# Patient Record
Sex: Female | Born: 1935 | Race: White | Hispanic: No | Smoking: Former smoker
Health system: Southern US, Community
[De-identification: ages and names within clinical notes are randomized; demographics above are authoritative.]

## PROBLEM LIST (undated history)

## (undated) DIAGNOSIS — D649 Anemia, unspecified: Secondary | ICD-10-CM

## (undated) DIAGNOSIS — R222 Localized swelling, mass and lump, trunk: Secondary | ICD-10-CM

## (undated) DIAGNOSIS — J449 Chronic obstructive pulmonary disease, unspecified: Secondary | ICD-10-CM

## (undated) DIAGNOSIS — J189 Pneumonia, unspecified organism: Secondary | ICD-10-CM

## (undated) DIAGNOSIS — M810 Age-related osteoporosis without current pathological fracture: Secondary | ICD-10-CM

## (undated) DIAGNOSIS — IMO0001 Reserved for inherently not codable concepts without codable children: Secondary | ICD-10-CM

## (undated) DIAGNOSIS — R609 Edema, unspecified: Secondary | ICD-10-CM

## (undated) DIAGNOSIS — E538 Deficiency of other specified B group vitamins: Secondary | ICD-10-CM

## (undated) DIAGNOSIS — K222 Esophageal obstruction: Secondary | ICD-10-CM

## (undated) DIAGNOSIS — J984 Other disorders of lung: Secondary | ICD-10-CM

## (undated) DIAGNOSIS — S022XXA Fracture of nasal bones, initial encounter for closed fracture: Secondary | ICD-10-CM

## (undated) DIAGNOSIS — J9610 Chronic respiratory failure, unspecified whether with hypoxia or hypercapnia: Secondary | ICD-10-CM

## (undated) DIAGNOSIS — M1990 Unspecified osteoarthritis, unspecified site: Secondary | ICD-10-CM

## (undated) HISTORY — DX: Deficiency of other specified B group vitamins: E53.8

## (undated) HISTORY — DX: Esophageal obstruction: K22.2

## (undated) HISTORY — DX: Chronic obstructive pulmonary disease, unspecified: J44.9

## (undated) HISTORY — PX: OTHER SURGICAL HISTORY: SHX169

## (undated) HISTORY — PX: CHOLECYSTECTOMY: SHX55

## (undated) HISTORY — PX: LUMBAR LAMINECTOMY: SHX95

## (undated) HISTORY — DX: Edema, unspecified: R60.9

## (undated) HISTORY — DX: Unspecified osteoarthritis, unspecified site: M19.90

## (undated) HISTORY — DX: Localized swelling, mass and lump, trunk: R22.2

## (undated) HISTORY — DX: Fracture of nasal bones, initial encounter for closed fracture: S02.2XXA

## (undated) HISTORY — PX: APPENDECTOMY: SHX54

## (undated) HISTORY — DX: Other disorders of lung: J98.4

## (undated) HISTORY — DX: Age-related osteoporosis without current pathological fracture: M81.0

## (undated) HISTORY — PX: ESOPHAGOGASTRODUODENOSCOPY: SHX1529

## (undated) HISTORY — PX: NASAL RECONSTRUCTION WITH SEPTAL REPAIR: SHX5665

## (undated) HISTORY — DX: Chronic respiratory failure, unspecified whether with hypoxia or hypercapnia: J96.10

## (undated) HISTORY — PX: RECTAL PROLAPSE REPAIR: SHX759

## (undated) HISTORY — PX: CATARACT EXTRACTION: SUR2

## (undated) HISTORY — PX: BREAST SURGERY: SHX581

---

## 2005-09-07 ENCOUNTER — Ambulatory Visit: Payer: Self-pay | Admitting: Internal Medicine

## 2005-10-12 ENCOUNTER — Ambulatory Visit: Payer: Self-pay | Admitting: Internal Medicine

## 2005-12-28 ENCOUNTER — Ambulatory Visit: Payer: Self-pay | Admitting: Internal Medicine

## 2006-02-12 ENCOUNTER — Ambulatory Visit: Payer: Self-pay | Admitting: Internal Medicine

## 2006-09-10 ENCOUNTER — Ambulatory Visit: Payer: Self-pay | Admitting: Internal Medicine

## 2006-09-17 ENCOUNTER — Ambulatory Visit: Payer: Self-pay | Admitting: Internal Medicine

## 2006-09-27 ENCOUNTER — Ambulatory Visit (HOSPITAL_COMMUNITY): Admission: RE | Admit: 2006-09-27 | Discharge: 2006-09-27 | Payer: Self-pay | Admitting: Internal Medicine

## 2006-10-16 ENCOUNTER — Encounter: Admission: RE | Admit: 2006-10-16 | Discharge: 2006-10-16 | Payer: Self-pay | Admitting: Internal Medicine

## 2007-02-14 ENCOUNTER — Ambulatory Visit: Payer: Self-pay | Admitting: Internal Medicine

## 2007-04-15 ENCOUNTER — Ambulatory Visit: Payer: Self-pay | Admitting: Internal Medicine

## 2007-04-17 ENCOUNTER — Encounter: Admission: RE | Admit: 2007-04-17 | Discharge: 2007-04-17 | Payer: Self-pay | Admitting: Internal Medicine

## 2007-08-07 ENCOUNTER — Telehealth: Payer: Self-pay | Admitting: Internal Medicine

## 2007-10-03 ENCOUNTER — Encounter: Admission: RE | Admit: 2007-10-03 | Discharge: 2007-10-03 | Payer: Self-pay | Admitting: Internal Medicine

## 2007-11-05 ENCOUNTER — Encounter: Payer: Self-pay | Admitting: Internal Medicine

## 2007-12-27 ENCOUNTER — Ambulatory Visit: Payer: Self-pay | Admitting: Internal Medicine

## 2007-12-27 DIAGNOSIS — R609 Edema, unspecified: Secondary | ICD-10-CM

## 2007-12-27 DIAGNOSIS — M810 Age-related osteoporosis without current pathological fracture: Secondary | ICD-10-CM

## 2007-12-27 HISTORY — DX: Edema, unspecified: R60.9

## 2007-12-27 HISTORY — DX: Age-related osteoporosis without current pathological fracture: M81.0

## 2008-03-24 ENCOUNTER — Ambulatory Visit: Payer: Self-pay | Admitting: Internal Medicine

## 2008-03-24 LAB — CONVERTED CEMR LAB
AST: 17 units/L (ref 0–37)
Basophils Absolute: 0.1 10*3/uL (ref 0.0–0.1)
Basophils Relative: 1.7 % — ABNORMAL HIGH (ref 0.0–1.0)
Blood in Urine, dipstick: NEGATIVE
Chloride: 104 meq/L (ref 96–112)
Cholesterol: 225 mg/dL (ref 0–200)
Creatinine, Ser: 1 mg/dL (ref 0.4–1.2)
Direct LDL: 126 mg/dL
Eosinophils Absolute: 0.5 10*3/uL (ref 0.0–0.7)
GFR calc non Af Amer: 56 mL/min
Glucose, Urine, Semiquant: NEGATIVE
HDL: 89.4 mg/dL (ref >39.0)
Ketones, urine, test strip: NEGATIVE
MCHC: 32.9 g/dL (ref 30.0–36.0)
MCV: 97.1 fL (ref 78.0–100.0)
Neutrophils Relative %: 51.7 % (ref 43.0–77.0)
Platelets: 293 10*3/uL (ref 150–400)
RBC: 4.1 M/uL (ref 3.87–5.11)
TSH: 3.65 microintl units/mL (ref 0.35–5.50)
Total Bilirubin: 0.8 mg/dL (ref 0.3–1.2)
Triglycerides: 57 mg/dL (ref 0–149)

## 2008-03-31 ENCOUNTER — Ambulatory Visit: Payer: Self-pay | Admitting: Internal Medicine

## 2008-03-31 DIAGNOSIS — M1990 Unspecified osteoarthritis, unspecified site: Secondary | ICD-10-CM | POA: Insufficient documentation

## 2008-03-31 DIAGNOSIS — E538 Deficiency of other specified B group vitamins: Secondary | ICD-10-CM

## 2008-03-31 HISTORY — DX: Unspecified osteoarthritis, unspecified site: M19.90

## 2008-03-31 HISTORY — DX: Deficiency of other specified B group vitamins: E53.8

## 2008-10-22 ENCOUNTER — Encounter: Admission: RE | Admit: 2008-10-22 | Discharge: 2008-10-22 | Payer: Self-pay | Admitting: Internal Medicine

## 2008-11-23 ENCOUNTER — Emergency Department (HOSPITAL_COMMUNITY): Admission: EM | Admit: 2008-11-23 | Discharge: 2008-11-23 | Payer: Self-pay | Admitting: Emergency Medicine

## 2008-11-24 ENCOUNTER — Telehealth: Payer: Self-pay | Admitting: Internal Medicine

## 2008-11-27 ENCOUNTER — Ambulatory Visit: Payer: Self-pay | Admitting: Internal Medicine

## 2008-11-27 DIAGNOSIS — J441 Chronic obstructive pulmonary disease with (acute) exacerbation: Secondary | ICD-10-CM

## 2008-11-27 DIAGNOSIS — J449 Chronic obstructive pulmonary disease, unspecified: Secondary | ICD-10-CM

## 2008-11-27 DIAGNOSIS — K8020 Calculus of gallbladder without cholecystitis without obstruction: Secondary | ICD-10-CM | POA: Insufficient documentation

## 2008-11-27 DIAGNOSIS — J4489 Other specified chronic obstructive pulmonary disease: Secondary | ICD-10-CM

## 2008-11-27 HISTORY — DX: Chronic obstructive pulmonary disease with (acute) exacerbation: J44.1

## 2008-11-27 HISTORY — DX: Chronic obstructive pulmonary disease, unspecified: J44.9

## 2008-11-27 HISTORY — DX: Other specified chronic obstructive pulmonary disease: J44.89

## 2008-12-01 ENCOUNTER — Telehealth: Payer: Self-pay | Admitting: Internal Medicine

## 2009-01-01 ENCOUNTER — Ambulatory Visit (HOSPITAL_COMMUNITY): Admission: RE | Admit: 2009-01-01 | Discharge: 2009-01-02 | Payer: Self-pay | Admitting: General Surgery

## 2009-01-01 ENCOUNTER — Encounter (INDEPENDENT_AMBULATORY_CARE_PROVIDER_SITE_OTHER): Payer: Self-pay | Admitting: General Surgery

## 2009-01-01 ENCOUNTER — Encounter (INDEPENDENT_AMBULATORY_CARE_PROVIDER_SITE_OTHER): Payer: Self-pay | Admitting: *Deleted

## 2009-01-06 ENCOUNTER — Ambulatory Visit: Payer: Self-pay | Admitting: Internal Medicine

## 2009-01-06 ENCOUNTER — Telehealth: Payer: Self-pay | Admitting: Internal Medicine

## 2009-01-28 ENCOUNTER — Ambulatory Visit: Payer: Self-pay | Admitting: Gastroenterology

## 2009-01-29 ENCOUNTER — Ambulatory Visit: Payer: Self-pay | Admitting: Gastroenterology

## 2009-01-29 DIAGNOSIS — K222 Esophageal obstruction: Secondary | ICD-10-CM

## 2009-01-29 HISTORY — DX: Esophageal obstruction: K22.2

## 2009-02-01 ENCOUNTER — Ambulatory Visit: Payer: Self-pay | Admitting: Gastroenterology

## 2009-02-01 ENCOUNTER — Telehealth: Payer: Self-pay | Admitting: Gastroenterology

## 2009-02-01 ENCOUNTER — Ambulatory Visit (HOSPITAL_COMMUNITY): Admission: RE | Admit: 2009-02-01 | Discharge: 2009-02-01 | Payer: Self-pay | Admitting: Gastroenterology

## 2009-02-17 ENCOUNTER — Encounter: Payer: Self-pay | Admitting: Gastroenterology

## 2009-02-22 ENCOUNTER — Encounter: Payer: Self-pay | Admitting: Gastroenterology

## 2009-02-22 ENCOUNTER — Telehealth: Payer: Self-pay | Admitting: Gastroenterology

## 2009-02-22 ENCOUNTER — Ambulatory Visit (HOSPITAL_COMMUNITY): Admission: RE | Admit: 2009-02-22 | Discharge: 2009-02-22 | Payer: Self-pay | Admitting: Gastroenterology

## 2009-03-08 ENCOUNTER — Ambulatory Visit: Payer: Self-pay | Admitting: Gastroenterology

## 2009-03-08 ENCOUNTER — Ambulatory Visit (HOSPITAL_COMMUNITY): Admission: RE | Admit: 2009-03-08 | Discharge: 2009-03-08 | Payer: Self-pay | Admitting: Gastroenterology

## 2009-05-05 ENCOUNTER — Ambulatory Visit: Payer: Self-pay | Admitting: Internal Medicine

## 2009-06-08 ENCOUNTER — Ambulatory Visit: Payer: Self-pay | Admitting: Gastroenterology

## 2009-08-08 ENCOUNTER — Encounter: Payer: Self-pay | Admitting: Family Medicine

## 2009-08-12 ENCOUNTER — Ambulatory Visit: Payer: Self-pay | Admitting: Family Medicine

## 2009-08-12 DIAGNOSIS — S0180XA Unspecified open wound of other part of head, initial encounter: Secondary | ICD-10-CM | POA: Insufficient documentation

## 2009-08-12 DIAGNOSIS — S022XXA Fracture of nasal bones, initial encounter for closed fracture: Secondary | ICD-10-CM

## 2009-08-12 HISTORY — DX: Fracture of nasal bones, initial encounter for closed fracture: S02.2XXA

## 2009-09-30 ENCOUNTER — Ambulatory Visit: Payer: Self-pay | Admitting: Internal Medicine

## 2009-10-08 ENCOUNTER — Encounter: Payer: Self-pay | Admitting: Internal Medicine

## 2009-10-13 ENCOUNTER — Ambulatory Visit (HOSPITAL_COMMUNITY): Admission: RE | Admit: 2009-10-13 | Discharge: 2009-10-14 | Payer: Self-pay | Admitting: Otolaryngology

## 2009-10-13 ENCOUNTER — Encounter (INDEPENDENT_AMBULATORY_CARE_PROVIDER_SITE_OTHER): Payer: Self-pay | Admitting: Otolaryngology

## 2009-10-13 ENCOUNTER — Telehealth: Payer: Self-pay | Admitting: Internal Medicine

## 2009-10-13 DIAGNOSIS — R222 Localized swelling, mass and lump, trunk: Secondary | ICD-10-CM

## 2009-10-13 HISTORY — DX: Localized swelling, mass and lump, trunk: R22.2

## 2009-10-21 ENCOUNTER — Ambulatory Visit: Payer: Self-pay | Admitting: Internal Medicine

## 2009-10-21 DIAGNOSIS — J984 Other disorders of lung: Secondary | ICD-10-CM

## 2009-10-21 HISTORY — DX: Other disorders of lung: J98.4

## 2009-10-25 ENCOUNTER — Encounter: Admission: RE | Admit: 2009-10-25 | Discharge: 2009-10-25 | Payer: Self-pay | Admitting: Internal Medicine

## 2009-12-02 ENCOUNTER — Encounter: Payer: Self-pay | Admitting: Internal Medicine

## 2009-12-03 ENCOUNTER — Ambulatory Visit: Payer: Self-pay | Admitting: Internal Medicine

## 2009-12-07 ENCOUNTER — Encounter: Payer: Self-pay | Admitting: Internal Medicine

## 2010-05-31 ENCOUNTER — Encounter: Payer: Self-pay | Admitting: Internal Medicine

## 2010-05-31 ENCOUNTER — Telehealth (INDEPENDENT_AMBULATORY_CARE_PROVIDER_SITE_OTHER): Payer: Self-pay | Admitting: *Deleted

## 2010-06-05 ENCOUNTER — Inpatient Hospital Stay (HOSPITAL_COMMUNITY): Admission: EM | Admit: 2010-06-05 | Discharge: 2010-06-10 | Payer: Self-pay | Admitting: Emergency Medicine

## 2010-06-07 ENCOUNTER — Ambulatory Visit: Payer: Self-pay | Admitting: Physical Medicine & Rehabilitation

## 2010-06-13 ENCOUNTER — Telehealth: Payer: Self-pay | Admitting: Internal Medicine

## 2010-06-16 ENCOUNTER — Encounter: Payer: Self-pay | Admitting: Internal Medicine

## 2010-06-27 ENCOUNTER — Encounter: Payer: Self-pay | Admitting: Internal Medicine

## 2010-06-29 ENCOUNTER — Ambulatory Visit: Payer: Self-pay | Admitting: Internal Medicine

## 2010-06-29 DIAGNOSIS — J9611 Chronic respiratory failure with hypoxia: Secondary | ICD-10-CM | POA: Insufficient documentation

## 2010-06-29 DIAGNOSIS — J9610 Chronic respiratory failure, unspecified whether with hypoxia or hypercapnia: Secondary | ICD-10-CM

## 2010-06-29 HISTORY — DX: Chronic respiratory failure, unspecified whether with hypoxia or hypercapnia: J96.10

## 2010-07-04 ENCOUNTER — Telehealth (INDEPENDENT_AMBULATORY_CARE_PROVIDER_SITE_OTHER): Payer: Self-pay | Admitting: *Deleted

## 2010-08-11 ENCOUNTER — Ambulatory Visit: Payer: Self-pay | Admitting: Internal Medicine

## 2010-08-15 ENCOUNTER — Encounter: Payer: Self-pay | Admitting: Internal Medicine

## 2010-08-16 ENCOUNTER — Telehealth (INDEPENDENT_AMBULATORY_CARE_PROVIDER_SITE_OTHER): Payer: Self-pay | Admitting: *Deleted

## 2010-10-13 ENCOUNTER — Telehealth (INDEPENDENT_AMBULATORY_CARE_PROVIDER_SITE_OTHER): Payer: Self-pay | Admitting: *Deleted

## 2010-10-21 ENCOUNTER — Ambulatory Visit: Payer: Self-pay | Admitting: Internal Medicine

## 2010-10-21 LAB — CONVERTED CEMR LAB: TSH: 3.03 microintl units/mL (ref 0.35–5.50)

## 2010-11-03 ENCOUNTER — Ambulatory Visit: Payer: Self-pay | Admitting: Internal Medicine

## 2011-01-08 ENCOUNTER — Encounter: Payer: Self-pay | Admitting: Emergency Medicine

## 2011-01-17 NOTE — Procedures (Signed)
Summary: Pulse Oximetry/IDS  Pulse Oximetry/IDS   Imported By: Sherian Rein 08/19/2010 09:31:01  _____________________________________________________________________  External Attachment:    Type:   Image     Comment:   External Document

## 2011-01-17 NOTE — Progress Notes (Signed)
Summary: returned call  Phone Note Call from Patient   Caller: Daughter Call For: wert Summary of Call: daughter in law Jocelyn Lamer returned call from nurse re: results. says ask nurse to call pt at [phone] Initial call taken by: Tivis Ringer, CNA,  July 04, 2010 8:51 AM  Follow-up for Phone Call        Spoke with pt's daugther-in-law and advised of results/recs per MW. Follow-up by: Vernie Murders,  July 04, 2010 9:44 AM

## 2011-01-17 NOTE — Progress Notes (Signed)
Summary: O2 concerns  Phone Note Call from Patient   Caller: son,matt Chalfin, [phone] recording says changed to nonpubl # Complaint: Nausea/Vomiting/Diarrhea Summary of Call: Mother fell & had fx hip & repair & is home now & doing well.  Concerns as per discharge instructions on O2 sat level remains 84-85% unless they get her to breather through nose.  Injuries nose from past.  Pulmonary care previously but not longterm.  What to do?  O2 at 3l/min.  Short of breath with activity.  Otherwise OK at low level O2 & may have been chronic.  GB yr ago & noted emphysema & no treatment.   Active usually.  Has home health & will see surgeon as indicated on discharge.  Wonders if her O2 level has been this low all along & if there's anything else they can do? Initial call taken by: Rudy Jew, RN,  June 13, 2010 11:43 AM  Follow-up for Phone Call        continue O2;  will see here or suggest f/u with Pulmonary (Dr Sherene Sires) Follow-up by: Gordy Savers  MD,  June 13, 2010 12:59 PM  Additional Follow-up for Phone Call Additional follow up Details #1::        Recording number left says number changed to nonpublished number.  Left message H# per Dr. Kirtland Bouchard. Additional Follow-up by: Rudy Jew, RN,  June 13, 2010 1:23 PM

## 2011-01-17 NOTE — Assessment & Plan Note (Signed)
Summary: Pulmonary/ext final f/u ov with HFA 75% p coaching   Copy to:  Dr. Leim Fabry Primary Kerie Badger/Referring Ronnika Collett:  Sharon [name]  CC:  DOE- the same.  History of Present Illness: 78  yowf quit smoking in 1980 with no minimal chronic doe until broke nose in August 2010 despite mild chronic nodular infiltrates dating back to 11/23/2008.   October 21, 2009 ov cc breathing abuptly worse since nose surgery one week prior to ov cc trouble with doe  steps that's new but so are the steps (new house).  December 03, 2009 1 month followup with cxr and PFT's.  Pt states breathing is fine and denies any complaints today.  No limiting sob, getting better on steps, no cough or early am cough or congestion. rec symbicort and f/u cxr in 6 month  Fx Right Hip and required ORIF June 19th 2011and placed on 02 post op  June 29, 2010 Post hospital followup.  Pt states that her breathing is fine.  She feels like she does not need to be on supplemental o2.  No compliants today. no cough. not using symbicort.  rec Try  using symbicort 2 puffs first thing  in am and 2 puffs again in pm about 12 hours later prescription and return here as needed Work on inhaler technique:  relax and blow all the way out then take a nice smooth deep breath back in, triggering the inhaler at same time you start breathing in  [facility] to leave 02 off while sitting.   August 11, 2010 6 weeks follow up, pt states breathing is fine, pt states she exercises everyday and has no SOB,  Pt states she has no concerns today. no limiting sob. no cough. no noct c/os on symbicort 2 puffs first thing  in am and 2 puffs again in pm about 12 hours later. Stay on symbicort 160 2 puffs first thing  in am and 2 puffs again in pm about 12 hours later but if you do ok on 02 test we'll ask you to use the symbicort 160  See Patient Care Coordinator before leaving for ONO on Room air  November 03, 2010 ov still wearing 02 at hs only but no limits with  daytime activity, no cough, no noct co's.  Pt denies any significant sore throat, dysphagia, itching, sneezing,  nasal congestion or excess secretions,  fever, chills, sweats, unintended wt loss, pleuritic or exertional cp, hempoptysis.    Current Medications (verified): 1)  Caltrate 600+d Plus 600-400 Mg-Unit Chew (Calcium Carbonate-Vit D-Min) .... 2 Chews Daily 2)  Aspirin 81 Mg Tbec (Aspirin) .Marland Kitchen.. 1 Once Daily 3)  Vitamin B 12 Injection .... One A Month 4)  Actonel 150 Mg Tabs (Risedronate Sodium) .... Once A Month 5)  Symbicort 160-4.5 Mcg/act  Aero (Budesonide-Formoterol Fumarate) .... 2 Puffs First Thing  in Am and 2 Puffs Again in Pm About 12 Hours Later 6)  O2 At 3l/min At Night  Allergies (verified): 1)  Penicillin G Potassium (Penicillin G Potassium)  Past History:  Past Medical History: Osteoporosis B12 deficiency Osteoarthritis COPD    - PFT's December 03, 2009 FEV1 67 (40%) with 12% response to B2 and DLC0 55%    - HFA 25% June 29, 2010 > 75% August 11, 2010 > 75% November 03, 2010  Fx Right Hip and required ORIF June 19th 2011> 02 dependent since > d/c November 03, 2010  history of esophageal stricture...........................................Marland KitchenPatterson Multiple pulmonary nodules..................................................Marland KitchenWert    -CT chest 10/13/09  assoc with bilateral lower lobe bronchiectasis    -CXR December 03, 2009 no change  > no change June 29, 2010   Vital Signs:  Patient profile:   77 year old female Weight:      112 pounds O2 Sat:      95 % on Room air Temp:     97.8 degrees F oral Pulse rate:   80 / minute BP sitting:   124 / 66  (left arm)  Vitals Entered By: Vernie Murders (November 03, 2010 10:42 AM)  O2 Flow:  Room air  Physical Exam  Additional Exam:  Thin amb wf nad   wt  112 October 21, 2009 > 112 December 03, 2009  > 111 June 29, 2010 > 112 August 11, 2010 > 112  November 03, 2010  HEENT mild turbinate edema.  Oropharynx no thrush  or excess pnd or cobblestoning.  No JVD or cervical adenopathy. Mild accessory muscle hypertrophy. Trachea midline, nl thryroid. Chest was hyperinflated by percussion with diminished breath sounds and moderate increased exp time without wheeze. Hoover sign positive at mid inspiration. Regular rate and rhythm without murmur gallop or rub or increase P2 or edema.  Abd: no hsm, nl excursion. Ext warm without cyanosis or clubbing.     Impression & Recommendations:  Problem # 1:  COPD (ICD-496)  GOLD III with significant reversible component.  Once safely off 02 ok to use symbicort as needed for symptom control  I spent extra time with the patient today explaining optimal mdi  technique.  This improved from  50-75%p coaching  Problem # 2:  RESPIRATORY FAILURE, CHRONIC (ICD-518.83) Last ono RA desat 5: 32 sec without 02 which barely meets the criteria for rx and pt wants off so reasonable to stop it for now. contingencies discussed.   Other Orders: Misc. Referral (Misc. Ref) Est. Patient Level III ([address]) HFA Instruction [phone])  Patient Instructions: 1)  ok to stop 02 at this point 2)  ok to change symbicort 160 2 puffs first thing  in am and 2 puffs again in pm about 12 hours later as needed  3)  Please schedule a follow-up appointment as needed

## 2011-01-17 NOTE — Miscellaneous (Signed)
Summary: Medication Issue Communication/[facility]  Medication Issue Communication/[facility]   Imported By: Maryln Gottron 06/28/2010 14:33:38  _____________________________________________________________________  External Attachment:    Type:   Image     Comment:   External Document

## 2011-01-17 NOTE — Letter (Signed)
Summary: [facility]  [facility]   Imported By: Maryln Gottron 07/05/2010 13:38:37  _____________________________________________________________________  External Attachment:    Type:   Image     Comment:   External Document

## 2011-01-17 NOTE — Letter (Signed)
Summary: [facility]  [address]   [place], [place] [address]   Phone: [phone]  Fax: [phone]    05/31/2010  Sharon [name] [address] [place], [place]  [address]  Dear Ms. Shorb,   Our records indicate that you are due for a followup chest x-ray.  Please call our office to schedule an appointment with Dr. Sherene Sires at [phone].  Thank You        Sincerely,   [facility]

## 2011-01-17 NOTE — Progress Notes (Signed)
Summary: Still desats at hs, needs ov  Phone Note Call from Patient Call back at Home Phone [phone]   Caller: Patient Call For: wert Summary of Call: pt was seen in aug. wants to know when she is to return (no mention in last ov notes). she wants to get off O2. i can call her back and schedule appt- yes let me know. thanks. kp Initial call taken by: Tivis Ringer, CNA,  October 13, 2010 11:42 AM  Follow-up for Phone Call        Dr Sherene Sires, when did you want to see this pt back? Pls advise thanks Follow-up by: Vernie Murders,  October 13, 2010 12:03 PM  Additional Follow-up for Phone Call Additional follow up Details #1::        her last study in august she still desaturated - let's see her back in ov to re-group  Additional Follow-up by: Nyoka Cowden MD,  October 13, 2010 4:12 PM    Additional Follow-up for Phone Call Additional follow up Details #2::    Spoke with pt and sched appt with MW for 11/03/10.   Follow-up by: Vernie Murders,  October 13, 2010 4:22 PM

## 2011-01-17 NOTE — Progress Notes (Signed)
Summary: needs repeat cxr- letter mailed  ---- Converted from flag ---- ---- 12/04/2009 8:33 AM, Nyoka Cowden MD wrote: needs f/u cxr this month, either here or at Dr Chauncey Mann office ------------------------------  ATC pt at [phone].  Was told that this is a wrong number. Will send pt a letter to call for appt with cxr Vernie Murders  May 31, 2010 10:08 AM

## 2011-01-17 NOTE — Progress Notes (Signed)
Summary: results of ONO > still desats RA----  Phone Note Call from Patient   Caller: daughter-in-law zalayah pizzuto Call For: wert Summary of Call: requests results of ONO. [phone] Initial call taken by: Tivis Ringer, CNA,  August 16, 2010 10:58 AM  Follow-up for Phone Call        MW ordered ONO on pt on 08-11-2010. Will forward message to MW to address results of ONO.  Aundra Millet Reynolds LPN  August 16, 2010 11:05 AM  still needs 02 at hs @ 2 lpm per guidelines based on > 5 min desaturations -we'll discuss details at next ov w/in 3 mon Follow-up by: Nyoka Cowden MD,  August 16, 2010 12:43 PM  Additional Follow-up for Phone Call Additional follow up Details #1::        ATC Nicholos Johns back.  NA and unable to leave a message.  WIll try back later.  Aundra Millet Reynolds LPN  August 16, 2010 2:03 PM   Spoke with pt and notified of results/recs per MW.  Pt verbalized understanding.  She states that she was the one who wanted the phone call for results initiated.  She prefers that we not speak with her daughter-in-law regarding her results. Additional Follow-up by: Vernie Murders,  August 16, 2010 2:12 PM

## 2011-01-17 NOTE — Assessment & Plan Note (Signed)
Summary: emp---will fast//ccm   Vital Signs:  Patient profile:   77 year old female Height:      64.75 inches Weight:      110 pounds Temp:     98.2 degrees F oral BP sitting:   120 / 80  (right arm) Cuff size:   regular  Vitals Entered By: Duard Brady LPN (October 21, 2010 9:38 AM) CC: cpx - doing well  Is Patient Diabetic? No   Primary Care Provider:  Keturah Shavers  CC:  cpx - doing well .  History of Present Illness: 77 year old patient who is seen today for a wellness exam.  She is followed closely by pulmonary medicine.  She was hospitalized in June for treatment of a hip fracture.  She has a history of chronic respired her insufficiency, COPD, and B12 deficiency.  She has treated osteoporosis.  Here for Medicare AWV:  1.   Risk factors based on Past M, S, F history:  cardiovascular risk factors include age, and history of tobacco use 2.   Physical Activities: limited due to gait instability, status post recent hip fracture 3.   Depression/mood: no history of depression or mood disorder 4.   Hearing: no significant impairment 5.   ADL's: independent in aspects of daily living 6.   Fall Risk: moderate to high due to gait instability and weakness following recent hip surgery 7.   Home Safety: no proms identifying 8.   Height, weight, &visual acuity:height and weight stable.  No change in visual acuity 9.   Counseling: will continue calcium and vitamin D  supplementation along with biphosphonate therapy 10.   Labs ordered based on risk factors:    TSH will be reviewed; laboratory studies were performed recently in the hospital 11.           Referral Coordination- continued pulmonary follow-up 12.           Care Plan- will increase her activity level.  She does have some mild constipation issues.  Will increase fluids.  Activity and a stool softener added to her regimen 13.            Cognitive Assessment- alert and oriented, with normal affect; no difficulty with  short-term memory;  handles all executive functioning without difficulty   Allergies: 1)  Penicillin G Potassium (Penicillin G Potassium)  Past History:  Past Medical History: Reviewed history from 08/11/2010 and no changes required. Osteoporosis B12 deficiency Osteoarthritis COPD    - PFT's December 03, 2009 FEV1 67 (40%) with 12% response to B2 and DLC0 55%    - HFA 25% June 29, 2010 > 75% August 11, 2010  Fx Right Hip and required ORIF June 19th 2011> 02 dependent since history of esophageal stricture...........................................Marland KitchenPatterson Multiple pulmonary nodules..................................................Marland KitchenWert    -CT chest 10/13/09 assoc with bilateral lower lobe bronchiectasis    -CXR December 03, 2009 no change  > no change June 29, 2010   Past Surgical History: Reviewed history from 10/21/2009 and no changes required. breast biopsy 1947 Appendectomy 1938 Cataract extraction 1986, and 2003 Lumbar laminectomy 2005 skin graft, 1998 surgery for a rectal prolapse 2006 colonoscopy December 2005 laparoscopic cholecystectomy in January 2010 status post prior esophageal dilatation s/p dilatation 3 Septal reconstruction 2010  Family History: Reviewed history from 06/08/2009 and no changes required. father died at age  58, complications of COPD, and EtOH mother died age 2, gastric cancer One sister deceased, unclear causes; history of EtOH; died age 50 No FH of  Colon Cancer:  Social History: Reviewed history from 01/28/2009 and no changes required. Married, 4 boys, 2 girls Retired Patient is a former smoker. Quit in early 1980's, 2 ppd for 20 years Alcohol Use - yes 2 drinks/day Daily Caffeine Use 2/day Illicit Drug Use - no  Review of Systems       The patient complains of muscle weakness and difficulty walking.  The patient denies anorexia, fever, weight loss, weight gain, vision loss, decreased hearing, hoarseness, chest pain, syncope,  dyspnea on exertion, peripheral edema, prolonged cough, headaches, hemoptysis, abdominal pain, melena, hematochezia, severe indigestion/heartburn, hematuria, incontinence, genital sores, suspicious skin lesions, transient blindness, depression, unusual weight change, abnormal bleeding, enlarged lymph nodes, angioedema, and breast masses.    Physical Exam  General:  elderly frail, alert, no distress; blood pressure of 130/80 Head:  Normocephalic and atraumatic without obvious abnormalities. No apparent alopecia or balding. Eyes:  No corneal or conjunctival inflammation noted. EOMI. Perrla. Funduscopic exam benign, without hemorrhages, exudates or papilledema. Vision grossly normal. Ears:  External ear exam shows no significant lesions or deformities.  Otoscopic examination reveals clear canals, tympanic membranes are intact bilaterally without bulging, retraction, inflammation or discharge. Hearing is grossly normal bilaterally. Nose:  External nasal examination shows no deformity or inflammation. Nasal mucosa are pink and moist without lesions or exudates. Mouth:  Oral mucosa and oropharynx without lesions or exudates.  Teeth in good repair. Neck:  No deformities, masses, or tenderness noted. Chest Wall:  No deformities, masses, or tenderness noted. Breasts:  No mass, nodules, thickening, tenderness, bulging, retraction, inflamation, nipple discharge or skin changes noted.   Lungs:  a few crackles, left base.  Right lung, essentially clear Heart:  Normal rate and regular rhythm. S1 and S2 normal without gallop, murmur, click, rub or other extra sounds. Abdomen:  Bowel sounds positive,abdomen soft and non-tender without masses, organomegaly or hernias noted. Rectal:  No external abnormalities noted. Normal sphincter tone. No rectal masses or tenderness. Genitalia:  atrophic changes only Msk:  No deformity or scoliosis noted of thoracic or lumbar spine.   Pulses:  pedal pulses intact Extremities:   No clubbing, cyanosis, edema, or deformity noted with normal full range of motion of all joints.   Neurologic:  alert & oriented X3, cranial nerves II-XII intact, strength normal in all extremities, sensation intact to pinprick, DTRs symmetrical and normal, and toes down bilaterally on Babinski.  alert & oriented X3, cranial nerves II-XII intact, strength normal in all extremities, sensation intact to pinprick, DTRs symmetrical and normal, and toes down bilaterally on Babinski.   Skin:  Intact without suspicious lesions or rashes Cervical Nodes:  No lymphadenopathy noted Axillary Nodes:  No palpable lymphadenopathy Inguinal Nodes:  No significant adenopathy Psych:  Cognition and judgment appear intact. Alert and cooperative with normal attention span and concentration. No apparent delusions, illusions, hallucinations   Impression & Recommendations:  Problem # 1:  PHYSICAL EXAMINATION (ICD-V70.0)  Orders: Medicare -1st Annual Wellness Visit [phone]) Venipuncture [phone]) TLB-TSH (Thyroid Stimulating Hormone) (84443-TSH) Specimen Handling (82505)  Complete Medication List: 1)  Caltrate 600+d Plus 600-400 Mg-unit Chew (Calcium carbonate-vit d-min) .... 2 chews daily 2)  Aspirin 81 Mg Tbec (Aspirin) .Marland Kitchen.. 1 once daily 3)  Vitamin B 12 Injection  .... One a month 4)  Actonel 150 Mg Tabs (Risedronate sodium) .... Once a month 5)  Symbicort 160-4.5 Mcg/act Aero (Budesonide-formoterol fumarate) .... 2 puffs first thing  in am and 2 puffs again in pm about 12 hours later 6)  O2 At 3l/min At Night   Patient Instructions: 1)  Please schedule a follow-up appointment in 6 months. 2)  Limit your Sodium (Salt). 3)  It is important that you exercise regularly at least 20 minutes 5 times a week. If you develop chest pain, have severe difficulty breathing, or feel very tired , stop exercising immediately and seek medical attention. 4)  Take calcium +Vitamin D daily. Prescriptions: SYMBICORT 160-4.5  MCG/ACT  AERO (BUDESONIDE-FORMOTEROL FUMARATE) 2 puffs first thing  in am and 2 puffs again in pm about 12 hours later  #1 x 6   Entered and Authorized by:   Gordy Savers  MD   Signed by:   Gordy Savers  MD on 10/21/2010   Method used:   Electronically to        [facility] [address] [phone]* (retail)       [address]       [place], [place]  [address]       Ph: [phone] or [phone]       Fax: [phone]   RxID:   [id number] ACTONEL 150 MG TABS (RISEDRONATE SODIUM) once a month  #12 x 6   Entered and Authorized by:   Gordy Savers  MD   Signed by:   Gordy Savers  MD on 10/21/2010   Method used:   Electronically to        [facility] [address] [phone]* (retail)       [address]       [place], [place]  [address]       Ph: [phone] or [phone]       Fax: [phone]   RxID:   [id number]    Orders Added: 1)  Medicare -1st Annual Wellness Visit [G0438] 2)  Est. Patient Level III [70623] 3)  Venipuncture [76283] 4)  TLB-TSH (Thyroid Stimulating Hormone) [84443-TSH] 5)  Specimen Handling [99000]

## 2011-01-17 NOTE — Assessment & Plan Note (Signed)
Summary: Pulmonary/ f/u copd/ HFA 75% p coaching >  no longer desat  walk   Copy to:  Dr. Leim Fabry Primary Provider/Referring Provider:  Keturah Shavers  CC:  6 weeks follow up, pt states breathing is fine, pt states she exercises everyday and has no SOB, and Pt states she has no concerns today.  History of Present Illness: 33  yowf quit smoking in 1980 with no minimal chronic doe until broke nose in August 2010 despite mild chronic nodular infiltrates dating back to 11/23/2008.   October 21, 2009 ov cc breathing abuptly worse since nose surgery one week prior to ov cc trouble with doe  steps that's new but so are the steps (new house).  December 03, 2009 1 month followup with cxr and PFT's.  Pt states breathing is fine and denies any complaints today.  No limiting sob, getting better on steps, no cough or early am cough or congestion. rec symbicort and f/u cxr in 6 month  Fx Right Hip and required ORIF June 19th 2011and placed on 02 post op  June 29, 2010 Post hospital followup.  Pt states that her breathing is fine.  She feels like she does not need to be on supplemental o2.  No compliants today. no cough. not using symbicort.  rec Try  using symbicort 2 puffs first thing  in am and 2 puffs again in pm about 12 hours later prescription and return here as needed Work on inhaler technique:  relax and blow all the way out then take a nice smooth deep breath back in, triggering the inhaler at same time you start breathing in  [facility] to leave 02 off while sitting.   August 11, 2010 6 weeks follow up, pt states breathing is fine, pt states she exercises everyday and has no SOB,  Pt states she has no concerns today. no limiting sob. no cough. no noct c/os on symbicort 2 puffs first thing  in am and 2 puffs again in pm about 12 hours later.  Pt denies any significant sore throat, dysphagia, itching, sneezing,  nasal congestion or excess secretions,  fever, chills, sweats, unintended wt loss, pleuritic  or exertional cp, hempoptysis, change in activity tolerance  orthopnea pnd or leg swelling.     Current Medications (verified): 1)  Caltrate 600+d Plus 600-400 Mg-Unit Chew (Calcium Carbonate-Vit D-Min) .... 2 Chews Daily 2)  Aspirin 81 Mg Tbec (Aspirin) .Marland Kitchen.. 1 Once Daily 3)  Vitamin B 12 Injection .... One A Month 4)  Actonel 150 Mg Tabs (Risedronate Sodium) .... Once A Month 5)  Aspir-Low 81 Mg Tbec (Aspirin) .... Once Daily  Allergies (verified): 1)  Penicillin G Potassium (Penicillin G Potassium)  Past History:  Past Medical History: Osteoporosis B12 deficiency Osteoarthritis COPD    - PFT's December 03, 2009 FEV1 67 (40%) with 12% response to B2 and DLC0 55%    - HFA 25% June 29, 2010 > 75% August 11, 2010  Fx Right Hip and required ORIF June 19th 2011> 02 dependent since history of esophageal stricture...........................................Marland KitchenPatterson Multiple pulmonary nodules..................................................Marland KitchenWert    -CT chest 10/13/09 assoc with bilateral lower lobe bronchiectasis    -CXR December 03, 2009 no change  > no change June 29, 2010   Vital Signs:  Patient profile:   77 year old female Height:      65 inches Weight:      112.2 pounds O2 Sat:      92 % on Room air Temp:  97.7 degrees F oral Pulse rate:   78 / minute BP sitting:   122 / 74  (right arm) Cuff size:   regular  Vitals Entered By: Carver Fila (August 11, 2010 11:53 AM)  O2 Flow:  Room air  Serial Vital Signs/Assessments:  Comments: Ambulatory Pulse Oximetry  Resting; HR_81____    02 Sat_95%RA____  Lap1 (185 feet)   HR_80____   02 Sat__93%RA___ Lap2 (185 feet)   HR_83____   02 Sat__92%RA___    Lap3 (185 feet)   HR_85____   02 Sat__92%RA___  _X__Test Completed without Difficulty ___Test Stopped due to:  Carver Fila  August 11, 2010 12:04 PM    By: Carver Fila   CC: 6 weeks follow up, pt states breathing is fine, pt states she exercises everyday and has no  SOB,  Pt states she has no concerns today Is Patient Diabetic? No Comments meds and allergies updated Phone number updated Carver Fila  August 11, 2010 11:54 AM    Physical Exam  Additional Exam:  Thin amb wf nad   wt  112 October 21, 2009 > 112 December 03, 2009  > 111 June 29, 2010 > 112 August 11, 2010  HEENT mild turbinate edema.  Oropharynx no thrush or excess pnd or cobblestoning.  No JVD or cervical adenopathy. Mild accessory muscle hypertrophy. Trachea midline, nl thryroid. Chest was hyperinflated by percussion with diminished breath sounds and moderate increased exp time without wheeze. Hoover sign positive at mid inspiration. Regular rate and rhythm without murmur gallop or rub or increase P2 or edema.  Abd: no hsm, nl excursion. Ext warm without cyanosis or clubbing.     Impression & Recommendations:  Problem # 1:  COPD (ICD-496) GOLD III with significant reversible component.  Once safely off 02 ok to use symbicort as needed for symptom control  I spent extra time with the patient today explaining optimal mdi  technique.  This improved from  50-75%  Problem # 2:  RESPIRATORY FAILURE, CHRONIC (ICD-518.83) No longer 02 dep at rest or with activity.    Will try her off 02 at hs next  Medications Added to Medication List This Visit: 1)  Vitamin B 12 Injection  .... One a month 2)  Actonel 150 Mg Tabs (Risedronate sodium) .... Once a month 3)  Aspir-low 81 Mg Tbec (Aspirin) .... Once daily 4)  Symbicort 160-4.5 Mcg/act Aero (Budesonide-formoterol fumarate) .... 2 puffs first thing  in am and 2 puffs again in pm about 12 hours later  Other Orders: Misc. Referral (Misc. Ref) Est. Patient Level III (52841) Pulse Oximetry, Ambulatory (32440) HFA Instruction [phone])  Clinical Reports Reviewed:  CXR:  06/29/2010: CXR Results:  Comparison: 06/05/2010   Findings: Normal heart size, mediastinal contours, and pulmonary vascularity. Atherosclerotic calcifications aortic  arch. Question tiny calcified granuloma right mid lung. Minimal atelectasis left upper lobe. Biapical scarring greater on the right with chronic bronchitic and underlying emphysematous changes. Chronic accentuation of right basilar markings stable. No definite acute infiltrate or effusion. Prior lumbar spine fixation. Bony demineralization.   IMPRESSION: Chronic lung changes. No acute abnormalities.  12/03/2009: CXR Results:     Comparison: Chest x-ray of 10/12/2009 and CT chest of 10/13/2009   Findings: The lungs are hyperaerated consistent with COPD.  The small nodular opacities noted on CT are not as well seen by chest x- ray.  Slightly prominent markings at the lung bases correspond to scarring and bronchiectatic change in both lower lobes on prior CT of  the chest.  The heart is within normal limits in size.  No bony abnormality is seen.   IMPRESSION: COPD and chronic change.  No active process.   Patient Instructions: 1)  Stay on symbicort 160 2 puffs first thing  in am and 2 puffs again in pm about 12 hours later but if you do ok on 02 test we'll ask you to use the symbicort 160  2)  See Patient Care Coordinator before leaving for ONO on Room air

## 2011-01-17 NOTE — Assessment & Plan Note (Signed)
Summary: Pulmonary/ ext post hosp f/u ov now on 02/ hfa 25%   Copy to:  Dr. Leim Fabry Primary Provider/Referring Provider:  Keturah Shavers  CC:  Post hospital followup.  Pt states that her breathing is fine.  She feels like she does not need to be on supplemental o2.  No compliants today.Marland Kitchen  History of Present Illness: 77  yowf quit smoking in 1980 with no minimal chronic doe until broke nose in August 2010 despite mild chronic nodular infiltrates dating back to 11/23/2008.   October 21, 2009 ov cc breathing abuptly worse since nose surgery one week prior to ov cc trouble with doe  steps that's new but so are the steps (new house).  December 03, 2009 1 month followup with cxr and PFT's.  Pt states breathing is fine and denies any complaints today.  No limiting sob, getting better on steps, no cough or early am cough or congestion. rec symbicort and f/u cxr in 6 month  Fx Right Hip and required ORIF June 19th 2011and placed on 02 post op  June 29, 2010 Post hospital followup.  Pt states that her breathing is fine.  She feels like she does not need to be on supplemental o2.  No compliants today. no cough. not using symbicort.  Pt denies any significant sore throat, dysphagia, itching, sneezing,  nasal congestion or excess secretions,  fever, chills, sweats, unintended wt loss, pleuritic or exertional cp, hempoptysis, change in activity tolerance  orthopnea pnd or leg swelling. Pt also denies any obvious fluctuation in symptoms with weather or environmental change or other alleviating or aggravating factors.       Current Medications (verified): 1)  Caltrate 600+d Plus 600-400 Mg-Unit Chew (Calcium Carbonate-Vit D-Min) .... 2 Chews Daily 2)  Aspirin 81 Mg Tbec (Aspirin) .Marland Kitchen.. 1 Once Daily  Allergies (verified): 1)  Penicillin G Potassium (Penicillin G Potassium)  Past History:  Past Medical History: Osteoporosis B12 deficiency Osteoarthritis COPD    - PFT's December 03, 2009 FEV1 67  (40%) with 12% response to B2 and DLC0 55%    - HFA 25% June 29, 2010  Fx Right Hip and required ORIF June 19th 2011> 02 dependent since history of esophageal stricture...........................................Marland KitchenPatterson Multiple pulmonary nodules..................................................Marland KitchenWert    -CT chest 10/13/09 assoc with bilateral lower lobe bronchiectasis    -CXR December 03, 2009 no change  > no change June 29, 2010   Vital Signs:  Patient profile:   77 year old female Weight:      111 pounds BMI:     18.54 O2 Sat:      95 % on 2 L/min Temp:     98.0 degrees F oral Pulse rate:   80 / minute BP sitting:   138 / 70  (left arm)  Vitals Entered By: Vernie Murders (June 29, 2010 11:45 AM)  O2 Flow:  2 L/min O2 Sat on room air at rest %:  92  Physical Exam  Additional Exam:  Thin amb wf nad   wt  112 October 21, 2009 > 112 December 03, 2009  > 111 June 29, 2010  HEENT mild turbinate edema.  Oropharynx no thrush or excess pnd or cobblestoning.  No JVD or cervical adenopathy. Mild accessory muscle hypertrophy. Trachea midline, nl thryroid. Chest was hyperinflated by percussion with diminished breath sounds and moderate increased exp time without wheeze. Hoover sign positive at mid inspiration. Regular rate and rhythm without murmur gallop or rub or increase P2 or edema.  Abd: no hsm, nl excursion. Ext warm without cyanosis or clubbing.     CXR  Procedure date:  06/29/2010  Findings:      Comparison: 06/05/2010   Findings: Normal heart size, mediastinal contours, and pulmonary vascularity. Atherosclerotic calcifications aortic arch. Question tiny calcified granuloma right mid lung. Minimal atelectasis left upper lobe. Biapical scarring greater on the right with chronic bronchitic and underlying emphysematous changes. Chronic accentuation of right basilar markings stable. No definite acute infiltrate or effusion. Prior lumbar spine fixation. Bony  demineralization.   IMPRESSION: Chronic lung changes. No acute abnormalities.  Impression & Recommendations:  Problem # 1:  COPD (ICD-496)   DDX of  difficult airways managment all start with A and  include Adherence, Ace Inhibitors, Acid Reflux, Active Sinus Disease, Alpha 1 Antitripsin deficiency, Anxiety masquerading as Airways dz,  ABPA,  allergy(esp in young), Aspiration (esp in elderly), Adverse effects of DPI,  Active smokers, plus one B  = Beta blocker use..    Adherence the biggest hurdle here. now that she's 02 dep she needs to take more time treating the underlying problem - if can't master symbicort hfa consider change to spiriva or advair next ov  I spent extra time with the patient today explaining optimal mdi  technique.  This improved from  10- 25 %  Problem # 2:  PULMONARY NODULE (ICD-518.89)  not apparent on plain film  Muliple pulmonary nodules < 8 mm therefore below the radar screen for PET,  minimally invasive bx or f/u on cxr for that matter, and old xrays won't do any good here.  Most likely they are benign; however, given the mulitple sites noted, there is no early option for surgical cure even if one of them turned out to be malignant. Therefore rec f/u cxr  in 6 months as per guidlelines and place in tickle file for this purpose.   Problem # 3:  RESPIRATORY FAILURE, CHRONIC (ICD-518.83) 02 dep following hip surgery  but based on previous dlco should not need this longterm  Medications Added to Medication List This Visit: 1)  Aspirin 81 Mg Tbec (Aspirin) .Marland Kitchen.. 1 once daily  Other Orders: Est. Patient Level IV (09811) HFA Instruction [phone]) T-2 View CXR (71020TC)  Patient Instructions: 1)  Try  using symbicort 2 puffs first thing  in am and 2 puffs again in pm about 12 hours later prescription and return here as needed 2)  Work on inhaler technique:  relax and blow all the way out then take a nice smooth deep breath back in, triggering the inhaler at same  time you start breathing in  3)  Ok to leave 02 off while sitting. 4)  Please schedule a follow-up appointment in 6 weeks, sooner if needed

## 2011-02-17 ENCOUNTER — Other Ambulatory Visit: Payer: Self-pay | Admitting: Dermatology

## 2011-03-05 LAB — URINE CULTURE: Colony Count: 60000

## 2011-03-05 LAB — URINALYSIS, ROUTINE W REFLEX MICROSCOPIC
Glucose, UA: NEGATIVE mg/dL
Protein, ur: NEGATIVE mg/dL
Specific Gravity, Urine: 1.018 (ref 1.005–1.030)
Urobilinogen, UA: 0.2 mg/dL (ref 0.0–1.0)

## 2011-03-05 LAB — BASIC METABOLIC PANEL
BUN: 6 mg/dL (ref 6–23)
BUN: 7 mg/dL (ref 6–23)
CO2: 25 mEq/L (ref 19–32)
Calcium: 7.6 mg/dL — ABNORMAL LOW (ref 8.4–10.5)
Calcium: 8.2 mg/dL — ABNORMAL LOW (ref 8.4–10.5)
Calcium: 9.7 mg/dL (ref 8.4–10.5)
Chloride: 100 mEq/L (ref 96–112)
Creatinine, Ser: 0.65 mg/dL (ref 0.4–1.2)
Creatinine, Ser: 0.85 mg/dL (ref 0.4–1.2)
GFR calc Af Amer: >60 mL/min (ref >60)
GFR calc Af Amer: >60 mL/min (ref >60)
GFR calc non Af Amer: >60 mL/min (ref >60)
GFR calc non Af Amer: >60 mL/min (ref >60)
GFR calc non Af Amer: >60 mL/min (ref >60)
Potassium: 4.6 mEq/L (ref 3.5–5.1)
Sodium: 133 mEq/L — ABNORMAL LOW (ref 135–145)
Sodium: 135 mEq/L (ref 135–145)

## 2011-03-05 LAB — CBC
HCT: 25.9 % — ABNORMAL LOW (ref 36.0–46.0)
Hemoglobin: 13.3 g/dL (ref 12.0–15.0)
MCHC: 34 g/dL (ref 30.0–36.0)
MCV: 97.8 fL (ref 78.0–100.0)
Platelets: 177 10*3/uL (ref 150–400)
Platelets: 180 10*3/uL (ref 150–400)
RBC: 2.4 MIL/uL — ABNORMAL LOW (ref 3.87–5.11)
RBC: 3.04 MIL/uL — ABNORMAL LOW (ref 3.87–5.11)
RBC: 3.99 MIL/uL (ref 3.87–5.11)
WBC: 11.5 10*3/uL — ABNORMAL HIGH (ref 4.0–10.5)
WBC: 13.9 10*3/uL — ABNORMAL HIGH (ref 4.0–10.5)
WBC: 8.5 10*3/uL (ref 4.0–10.5)
WBC: 8.9 10*3/uL (ref 4.0–10.5)
WBC: 9.5 10*3/uL (ref 4.0–10.5)

## 2011-03-05 LAB — DIFFERENTIAL
Basophils Absolute: 0.1 10*3/uL (ref 0.0–0.1)
Lymphocytes Relative: 13 % (ref 12–46)
Lymphs Abs: 1.8 10*3/uL (ref 0.7–4.0)
Monocytes Absolute: 0.8 10*3/uL (ref 0.1–1.0)
Neutro Abs: 11.1 10*3/uL — ABNORMAL HIGH (ref 1.7–7.7)

## 2011-03-05 LAB — CROSSMATCH
ABO/RH(D): A POS
Antibody Screen: NEGATIVE

## 2011-03-05 LAB — PROTIME-INR
INR: 1.06 (ref 0.00–1.49)
INR: 1.63 — ABNORMAL HIGH (ref 0.00–1.49)
INR: 1.65 — ABNORMAL HIGH (ref 0.00–1.49)
Prothrombin Time: 13.2 seconds (ref 11.6–15.2)
Prothrombin Time: 13.7 seconds (ref 11.6–15.2)
Prothrombin Time: 19.2 seconds — ABNORMAL HIGH (ref 11.6–15.2)
Prothrombin Time: 19.4 seconds — ABNORMAL HIGH (ref 11.6–15.2)
Prothrombin Time: 21.5 seconds — ABNORMAL HIGH (ref 11.6–15.2)

## 2011-03-05 LAB — URINE MICROSCOPIC-ADD ON

## 2011-03-05 LAB — ABO/RH: ABO/RH(D): A POS

## 2011-03-05 LAB — ETHANOL: Alcohol, Ethyl (B): <5 mg/dL (ref 0–10)

## 2011-03-23 LAB — BASIC METABOLIC PANEL WITH GFR
CO2: 28 meq/L (ref 19–32)
Calcium: 9.5 mg/dL (ref 8.4–10.5)
Chloride: 99 meq/L (ref 96–112)
GFR calc Af Amer: >60 mL/min (ref >60)
Potassium: 4.1 meq/L (ref 3.5–5.1)
Sodium: 136 meq/L (ref 135–145)

## 2011-03-23 LAB — CBC
HCT: 38.9 % (ref 36.0–46.0)
Hemoglobin: 13.4 g/dL (ref 12.0–15.0)
MCHC: 34.4 g/dL (ref 30.0–36.0)
MCV: 95.7 fL (ref 78.0–100.0)
Platelets: 340 10*3/uL (ref 150–400)
RBC: 4.06 MIL/uL (ref 3.87–5.11)
RDW: 14.3 % (ref 11.5–15.5)
WBC: 8.8 10*3/uL (ref 4.0–10.5)

## 2011-03-23 LAB — URINE MICROSCOPIC-ADD ON

## 2011-03-23 LAB — URINALYSIS, ROUTINE W REFLEX MICROSCOPIC
Bilirubin Urine: NEGATIVE
Glucose, UA: NEGATIVE mg/dL
Hgb urine dipstick: NEGATIVE
Ketones, ur: NEGATIVE mg/dL
Nitrite: NEGATIVE
Protein, ur: NEGATIVE mg/dL
Specific Gravity, Urine: 1.012 (ref 1.005–1.030)
Urobilinogen, UA: 0.2 mg/dL (ref 0.0–1.0)
pH: 7.5 (ref 5.0–8.0)

## 2011-03-23 LAB — BASIC METABOLIC PANEL
BUN: 15 mg/dL (ref 6–23)
Creatinine, Ser: 0.79 mg/dL (ref 0.4–1.2)
GFR calc non Af Amer: >60 mL/min (ref >60)
Glucose, Bld: 90 mg/dL (ref 70–99)

## 2011-04-03 ENCOUNTER — Encounter: Payer: Self-pay | Admitting: Internal Medicine

## 2011-04-03 ENCOUNTER — Ambulatory Visit (INDEPENDENT_AMBULATORY_CARE_PROVIDER_SITE_OTHER): Payer: Federal, State, Local not specified - PPO | Admitting: Internal Medicine

## 2011-04-03 VITALS — BP 118/70 | Temp 97.8°F | Wt 109.0 lb

## 2011-04-03 DIAGNOSIS — J4489 Other specified chronic obstructive pulmonary disease: Secondary | ICD-10-CM

## 2011-04-03 DIAGNOSIS — R609 Edema, unspecified: Secondary | ICD-10-CM

## 2011-04-03 DIAGNOSIS — R600 Localized edema: Secondary | ICD-10-CM

## 2011-04-03 DIAGNOSIS — J449 Chronic obstructive pulmonary disease, unspecified: Secondary | ICD-10-CM

## 2011-04-03 LAB — BASIC METABOLIC PANEL
BUN: 14 mg/dL (ref 6–23)
CO2: 28 mEq/L (ref 19–32)
Chloride: 103 mEq/L (ref 96–112)
GFR calc non Af Amer: >60 mL/min (ref >60)
Glucose, Bld: 93 mg/dL (ref 70–99)
Potassium: 4.7 mEq/L (ref 3.5–5.1)
Sodium: 138 mEq/L (ref 135–145)

## 2011-04-03 LAB — CBC
HCT: 40.1 % (ref 36.0–46.0)
Hemoglobin: 13.3 g/dL (ref 12.0–15.0)
MCHC: 33 g/dL (ref 30.0–36.0)
MCV: 96 fL (ref 78.0–100.0)
Platelets: 293 10*3/uL (ref 150–400)
RDW: 14.7 % (ref 11.5–15.5)

## 2011-04-03 NOTE — Progress Notes (Signed)
  Subjective:    Patient ID: Sharon [name], female    DOB: [birth year], 77 y.o.   MRN: [id number]  HPI  77 year old patient who presents with a ten-day history of worsening pain and swelling involving the right lower leg and ankle. There has been no trauma. She does have a history of occasional pedal edema that has always been bilateral and not associated with discomfort. She's had pain and fullness involving her right lower leg associated with worsening swelling about the ankle. Denies any pulmonary symptoms    Review of Systems  Constitutional: Negative.   HENT: Negative for hearing loss, congestion, sore throat, rhinorrhea, dental problem, sinus pressure and tinnitus.   Eyes: Negative for pain, discharge and visual disturbance.  Respiratory: Negative for cough and shortness of breath.   Cardiovascular: Positive for leg swelling. Negative for chest pain and palpitations.  Gastrointestinal: Negative for nausea, vomiting, abdominal pain, diarrhea, constipation, blood in stool and abdominal distention.  Genitourinary: Negative for dysuria, urgency, frequency, hematuria, flank pain, vaginal bleeding, vaginal discharge, difficulty urinating, vaginal pain and pelvic pain.  Musculoskeletal: Negative for joint swelling, arthralgias and gait problem.  Skin: Negative for rash.  Neurological: Negative for dizziness, syncope, speech difficulty, weakness, numbness and headaches.  Hematological: Negative for adenopathy.  Psychiatric/Behavioral: Negative for behavioral problems, dysphoric mood and agitation. The patient is not nervous/anxious.        Objective:   Physical Exam  Constitutional: She appears well-nourished. No distress.  Musculoskeletal: She exhibits edema.       Patient has slight tenderness involving the posterior aspect of the right lower leg; Pedal pulses were intact;  she had right ankle edema          Assessment & Plan:  Right leg edema. Rule out DVT. Will treat with Advil  elevation. We'll check a venous Doppler

## 2011-04-03 NOTE — Patient Instructions (Signed)
Limit your sodium (Salt) intake  Keep legs elevated as much as possible  Venous Doppler study as discussed

## 2011-04-04 ENCOUNTER — Telehealth: Payer: Self-pay | Admitting: Internal Medicine

## 2011-04-04 ENCOUNTER — Other Ambulatory Visit: Payer: Self-pay | Admitting: Internal Medicine

## 2011-04-04 ENCOUNTER — Ambulatory Visit
Admission: RE | Admit: 2011-04-04 | Discharge: 2011-04-04 | Disposition: A | Discharge status: Home / Self Care | Payer: Federal, State, Local not specified - PPO | Source: Ambulatory Visit | Attending: Internal Medicine | Admitting: Internal Medicine

## 2011-04-04 DIAGNOSIS — M7989 Other specified soft tissue disorders: Secondary | ICD-10-CM

## 2011-04-04 DIAGNOSIS — R609 Edema, unspecified: Secondary | ICD-10-CM

## 2011-04-04 DIAGNOSIS — R52 Pain, unspecified: Secondary | ICD-10-CM

## 2011-04-04 NOTE — Telephone Encounter (Signed)
Order placed - for venous doppler - r/o clot

## 2011-04-04 NOTE — Telephone Encounter (Signed)
Did you get the order from Dr. Amador Cunas from yesterday's appt. ?

## 2011-04-04 NOTE — Telephone Encounter (Signed)
I have not received an order for this patient, yet.

## 2011-04-04 NOTE — Telephone Encounter (Signed)
Pts daughter-in-law called to check on status of testing for blood clot in pts leg. Pls call asap or call the pt at home # [phone]

## 2011-04-05 ENCOUNTER — Telehealth: Payer: Self-pay | Admitting: Internal Medicine

## 2011-04-05 NOTE — Telephone Encounter (Signed)
Requesting test results from yesterday. Pt would like for a nurse to return her call today.

## 2011-04-05 NOTE — Telephone Encounter (Signed)
Pt informed neg doppler

## 2011-04-06 ENCOUNTER — Telehealth: Payer: Self-pay | Admitting: Internal Medicine

## 2011-04-06 NOTE — Telephone Encounter (Signed)
Patient received lab report from [facility] on yesterday and the patient would like for Dr Charm Rings nurse to return call to tell her what is her next step? She knows that Dr Kirtland Bouchard is out.

## 2011-04-06 NOTE — Telephone Encounter (Signed)
Called pt back - pt upset - states no one had called her about doppler results , leg painful ,swelling. I see where bonnye spoke with pt yesterday and informed of neg test results. Pt does recall that conversation, but doesn't know what the next step is. She called earlier today wanting to talk with me - i was involved in a procedure and could not take her call - she felt like she was being forgotten about by not returning her calls.Call taken 143pm bonnye spoke with her 240pm. I offered her an appt today if leg was still bothering her according to our current conversation - made her aware dr.kwiatkowski out of office until Monday - declined to see any other doc. , states its hurt this long , it can wait until Monday - i will be ok until then. appt made for 1015am. KIK

## 2011-04-10 ENCOUNTER — Encounter: Payer: Self-pay | Admitting: Internal Medicine

## 2011-04-10 ENCOUNTER — Ambulatory Visit (INDEPENDENT_AMBULATORY_CARE_PROVIDER_SITE_OTHER): Payer: Federal, State, Local not specified - PPO | Admitting: Internal Medicine

## 2011-04-10 DIAGNOSIS — R609 Edema, unspecified: Secondary | ICD-10-CM

## 2011-04-10 DIAGNOSIS — J449 Chronic obstructive pulmonary disease, unspecified: Secondary | ICD-10-CM

## 2011-04-10 NOTE — Patient Instructions (Signed)
Limit your sodium (Salt) intake  Elevate your legs as much as possible  Call or return to clinic prn if these symptoms worsen or fail to improve as anticipated.  Use  support stockings to help control the edema

## 2011-04-10 NOTE — Progress Notes (Signed)
  Subjective:    Patient ID: Sharon [name], female    DOB: 23-Nov-1923, 77 y.o.   MRN: [id number]  HPI 77 year old patient who was seen last week with right leg edema. She did have a venous Doppler study performed that was negative for DVT. She continues to have some swelling but very little in the way of discomfort. She does have a history of fairly advanced COPD which has been stable.    Review of Systems  Constitutional: Negative.   HENT: Negative for hearing loss, congestion, sore throat, rhinorrhea, dental problem, sinus pressure and tinnitus.   Eyes: Negative for pain, discharge and visual disturbance.  Respiratory: Negative for cough and shortness of breath.   Cardiovascular: Positive for leg swelling. Negative for chest pain and palpitations.  Gastrointestinal: Negative for nausea, vomiting, abdominal pain, diarrhea, constipation, blood in stool and abdominal distention.  Genitourinary: Negative for dysuria, urgency, frequency, hematuria, flank pain, vaginal bleeding, vaginal discharge, difficulty urinating, vaginal pain and pelvic pain.  Musculoskeletal: Negative for joint swelling, arthralgias and gait problem.  Skin: Negative for rash.  Neurological: Negative for dizziness, syncope, speech difficulty, weakness, numbness and headaches.  Hematological: Negative for adenopathy.  Psychiatric/Behavioral: Negative for behavioral problems, dysphoric mood and agitation. The patient is not nervous/anxious.        Objective:   Physical Exam  Constitutional: She appears well-developed and well-nourished. No distress.       Anxious but in no acute distress  Musculoskeletal: She exhibits edema.       The patient has swelling involving her right lower leg. There is no calf tenderness excessive warmth or erythema to suggest cellulitis          Assessment & Plan:  Swelling right lower leg. No evidence of DVT or cellulitis. Patient probably has chronic venous insufficiency. Will treat with  salt restriction elevation and not compression hose. She will call if unimproved

## 2011-05-02 NOTE — Op Note (Signed)
Sharon [name], Sharon [name] NO.:  [id number]   MEDICAL RECORD NO.:  [id number]          PATIENT TYPE:  OIB   LOCATION:  5155                         FACILITY:  [facility]   PHYSICIAN:  Lennie Muckle, MD      DATE OF BIRTH:  [birth year]   DATE OF PROCEDURE:  01/01/2009  DATE OF DISCHARGE:                               OPERATIVE REPORT   PREOPERATIVE DIAGNOSIS:  Biliary colic.   POSTOPERATIVE DIAGNOSIS:  Biliary colic.   PROCEDURE:  Laparoscopic cholecystectomy and cholangiogram.   SURGEON:  Lennie Muckle, MD   ASSISTANT:  None.   ANESTHESIA:  General endotracheal anesthesia.   FINDINGS:  Patent gallbladder, right lobe; patent common bile duct,  right and left hepatic ducts.   SPECIMENS:  Gallbladder, to pathology.   COMPLICATIONS:  No immediate complications.   DRAINS:  No drains placed.   INDICATIONS FOR PROCEDURE:  Sharon [name] is an 77 year old female I had  seen due to history of abdominal pain causing her to go to the emergency  room.  She has had an elevation in her liver enzymes and had an  ultrasound, which reveals several stones.  I had felt that she had a  brief episode of choledocholithiasis, therefore and after discussion  with gastroenterology, I felt that she would benefit from a laparoscopic  cholecystectomy.  Informed consent was obtained prior to the procedure.  She did receive clearance from Dr. Amador Cunas.   DETAILS OF PROCEDURE:  Sharon [name] was identified in the preoperative  holding area.  She received cefoxitin and was taken to the operating  room.  Once in the operating room, she was placed in the supine  position.  After administration of general endotracheal anesthesia, her  abdomen was prepped and draped in the usual sterile fashion.  A time-out  procedure indicating patient and procedure was performed.  I placed an  incision beneath the umbilicus.  Fascia was grasped with Kocher.  I  placed the Veress needle into the abdominal cavity.   After adequate  pneumoinsufflation, I placed a 10-mm trocar using the Optiview into the  abdominal cavity.  Layers of abdominal wall were visualized upon entry.  I then inspected the abdomen.  There was no evidence of injury upon  placement of the trocar or the Veress needle.  There appeared to be  adhesions inferiorly in the abdominal wall.  I then placed a 5-mm trocar  in the epigastric region under visualization with camera.  Two  additional 5-mm trocars were placed in the right upper quadrant under  visualization with camera.  Gallbladder was identified in normal  anatomic position.  The fundus was grasped with a blunt grasper.  Peritoneal attachments were bluntly dissected.  The fundus was retracted  toward the head of the patient.  I grasped the infundibulum away from  the liver bed.  Using [place] forceps and electrocautery, I dissected  out the cystic duct.  The cystic artery was located posterior to the  cystic duct.  I placed a clip proximally on the gallbladder.  A small  rent was placed at the  infundibulum of the gallbladder.  I attempted to  secure this hole with a clip applier.  I placed the cholangiogram  catheter into the cystic duct and secured.  Cholangiogram revealed a  patent common bile duct, right and left hepatic ducts with flow easily  into the duodenum.  I then placed 3 clips proximally on the cystic duct  until after removal of the catheter.  This was transected with  laparoscopic scissors.  I then clipped and divided the cystic artery.  There did appear to be a posterior branch which was clipped.  The  remaining peritoneal attachments were dissected out with the  electrocautery.  The gallbladder was placed in EndoCatch bag and removed  through umbilical incision.  I then irrigated the abdomen with 1 liter  of saline.  There was no evidence of bleeding of the liver bed.  Appeared to be no injury to the abdomen.  I then closed the fascial  defect of the umbilicus  with 0-Vicryl suture in figure-of-eight fashion.  We dissected only minimal amount of adhesions at the umbilicus.  It did  not appear to have bowel within the adhesions.  Then, pneumoperitoneum  was released.  Trocars were removed.  Skin was closed with 4-0 Monocryl.  Dermabond was placed for final dressing.  The patient was extubated and  transported to post anesthesia care unit in stable condition.  She will  be monitored overnight and then likely discharged home tomorrow with  Vicodin.      Lennie Muckle, MD  Electronically Signed     ALA/MEDQ  D:  01/01/2009  T:  01/02/2009  Job:  [id number]   cc:   Gordy Savers, MD

## 2011-05-02 NOTE — Discharge Summary (Signed)
NAMEJASALYN, FRYSINGER NO.:  [id number]   MEDICAL RECORD NO.:  [id number]          PATIENT TYPE:  OIB   LOCATION:  5155                         FACILITY:  [facility]   PHYSICIAN:  Lennie Muckle, MD      DATE OF BIRTH:  05-16-1923   DATE OF ADMISSION:  01/01/2009  DATE OF DISCHARGE:  01/02/2009                               DISCHARGE SUMMARY   FINAL DIAGNOSIS:  Status post laparoscopic cholecystectomy for biliary  colic.   HOSPITAL COURSE:  Ms. Nierman was admitted following the laparoscopic  cholecystectomy and cholangiogram.  She had an uneventful postoperative  course.  She has had a minimal amount pain in her abdomen.  She is  taking Vicodin 5/325 one to two every 4 hours p.r.n. for pain.  She will  be discharged home after breakfast today.  I have instructed her that  she may shower daily.  No heavy lifting over 20 pounds.  Can apply  Vaseline to her wounds after 10 days to wipe off the Dermabond.  She  will follow up with me in approximately 2 or 3 weeks' time.  I did relay  to her if she did develop diarrhea with eating certain foods, she may  need to refrain from these, but otherwise, she can have a diet as  tolerated.      Lennie Muckle, MD  Electronically Signed     ALA/MEDQ  D:  01/02/2009  T:  01/02/2009  Job:  [id number]   cc:   Gordy Savers, MD

## 2011-05-05 NOTE — Assessment & Plan Note (Signed)
[facility] OFFICE NOTE   Sharon [name], Sharon [name]                         MRN:          [id number]  DATE:09/17/2006                            DOB:          07/10/1923    An 77 year old female seen today for an annual exam.  She does quite well.  She has a history of hip osteoporosis and has been on combination  Actonel/Evista.  She also has B12 deficiency and self-administers monthly  B12 shots.  She does quite well.  No new concerns or complaints.   She has a history of DJD and lumbar radiculopathy.  She has had bilateral  cataract extractions and in 2006 had surgery for rectal prolapse.   FAMILY HISTORY:  Unchanged.  Both parents deceased in their early 48s,  mother from possible stomach cancer.  The patient did have a colonoscopy in  December 2005.   PHYSICAL EXAMINATION:  GENERAL:  A thin, healthy-appearing female in no  acute distress.  VITAL SIGNS:  Blood pressure is low normal.  SKIN:  Unremarkable except for senile changes.  HEAD AND NECK:  Normal fundi.  Ear, nose and throat clear.  Neck:  No  bruits.  CHEST:  Clear.  BREASTS:  Negative, mild nodularity.  CARDIOVASCULAR:  Normal heart sounds, no murmurs.  ABDOMEN:  Benign.  Well-healed surgical scars in the lower midline and right  lower quadrant.  No organomegaly, masses or bruits.  PELVIC:  Normal cervix.  No adnexal masses.  RECTAL:  Heme-negative stool.  EXTREMITIES:  No significant edema.  Peripheral pulses were faint.  NEUROLOGIC:  Negative.   IMPRESSION:  1. Osteoporosis.  2. Degenerative joint disease.  3. B12 deficiency.   DISPOSITION:  Her medical regimen will be unchanged.  Laboratory update  reviewed.  Will recheck in 1 year.  Flu vaccine administered.           ______________________________  Gordy Savers, MD    PFK/MedQ  DD:  09/17/2006  DT:  09/18/2006  Job #:  [id number]

## 2011-08-07 ENCOUNTER — Other Ambulatory Visit: Payer: Self-pay | Admitting: Internal Medicine

## 2011-09-22 LAB — POCT CARDIAC MARKERS
CKMB, poc: <1 ng/mL — ABNORMAL LOW (ref 1.0–8.0)
Myoglobin, poc: 85.6 ng/mL (ref 12–200)

## 2011-09-22 LAB — POCT I-STAT, CHEM 8
HCT: 41 % (ref 36.0–46.0)
Hemoglobin: 13.9 g/dL (ref 12.0–15.0)
Sodium: 135 mEq/L (ref 135–145)
TCO2: 24 mmol/L (ref 0–100)

## 2011-09-22 LAB — LIPASE, BLOOD: Lipase: 19 U/L (ref 11–59)

## 2011-09-22 LAB — HEPATIC FUNCTION PANEL
Albumin: 3.2 g/dL — ABNORMAL LOW (ref 3.5–5.2)
Total Protein: 6.2 g/dL (ref 6.0–8.3)

## 2011-09-22 LAB — TROPONIN I: Troponin I: 0.02 ng/mL (ref 0.00–0.06)

## 2011-09-26 ENCOUNTER — Ambulatory Visit (INDEPENDENT_AMBULATORY_CARE_PROVIDER_SITE_OTHER): Payer: Federal, State, Local not specified - PPO

## 2011-09-26 DIAGNOSIS — Z23 Encounter for immunization: Secondary | ICD-10-CM

## 2011-11-06 ENCOUNTER — Other Ambulatory Visit: Payer: Self-pay | Admitting: Internal Medicine

## 2011-12-20 ENCOUNTER — Ambulatory Visit (INDEPENDENT_AMBULATORY_CARE_PROVIDER_SITE_OTHER): Payer: Federal, State, Local not specified - PPO | Admitting: Internal Medicine

## 2011-12-20 ENCOUNTER — Encounter: Payer: Self-pay | Admitting: Internal Medicine

## 2011-12-20 VITALS — BP 138/80 | HR 80 | Temp 98.2°F | Resp 18 | Ht 66.0 in | Wt 109.0 lb

## 2011-12-20 DIAGNOSIS — R609 Edema, unspecified: Secondary | ICD-10-CM

## 2011-12-20 DIAGNOSIS — E538 Deficiency of other specified B group vitamins: Secondary | ICD-10-CM

## 2011-12-20 DIAGNOSIS — Z0000 Encounter for general adult medical examination without abnormal findings: Secondary | ICD-10-CM

## 2011-12-20 DIAGNOSIS — M1990 Unspecified osteoarthritis, unspecified site: Secondary | ICD-10-CM

## 2011-12-20 DIAGNOSIS — M810 Age-related osteoporosis without current pathological fracture: Secondary | ICD-10-CM

## 2011-12-20 DIAGNOSIS — J9610 Chronic respiratory failure, unspecified whether with hypoxia or hypercapnia: Secondary | ICD-10-CM

## 2011-12-20 DIAGNOSIS — J449 Chronic obstructive pulmonary disease, unspecified: Secondary | ICD-10-CM

## 2011-12-20 LAB — COMPREHENSIVE METABOLIC PANEL
BUN: 16 mg/dL (ref 6–23)
CO2: 29 mEq/L (ref 19–32)
Calcium: 9.5 mg/dL (ref 8.4–10.5)
Chloride: 102 mEq/L (ref 96–112)
Creatinine, Ser: 0.7 mg/dL (ref 0.4–1.2)
GFR: 84.2 mL/min (ref >60.00)
Glucose, Bld: 93 mg/dL (ref 70–99)

## 2011-12-20 LAB — CBC WITH DIFFERENTIAL/PLATELET
Basophils Absolute: 0 10*3/uL (ref 0.0–0.1)
Hemoglobin: 14.2 g/dL (ref 12.0–15.0)
Lymphocytes Relative: 26.9 % (ref 12.0–46.0)
Monocytes Relative: 8.4 % (ref 3.0–12.0)
Neutro Abs: 4.7 10*3/uL (ref 1.4–7.7)
RBC: 4.37 Mil/uL (ref 3.87–5.11)
RDW: 14.8 % — ABNORMAL HIGH (ref 11.5–14.6)
WBC: 7.8 10*3/uL (ref 4.5–10.5)

## 2011-12-20 MED ORDER — BUDESONIDE-FORMOTEROL FUMARATE 160-4.5 MCG/ACT IN AERO: 2.0000 | INHALATION_SPRAY | Freq: Two times a day (BID) | RESPIRATORY_TRACT | Status: DC

## 2011-12-20 MED ORDER — RISEDRONATE SODIUM 150 MG PO TABS: 150.0000 mg | ORAL_TABLET | ORAL | Status: DC

## 2011-12-20 NOTE — Patient Instructions (Signed)
It is important that you exercise regularly, at least 20 minutes 3 to 4 times per week.  If you develop chest pain or shortness of breath seek  medical attention.  Take a calcium supplement, plus 800-1200 units of vitamin D  Return in 6 months for follow-up  

## 2011-12-20 NOTE — Progress Notes (Signed)
Subjective:    Patient ID: Sharon [name], female    DOB: 1923/01/19, 78 y.o.   MRN: [id number]  HPI   History of Present Illness:   78 year-old patient who is seen today for a wellness exam. She is followed closely by pulmonary medicine. She was hospitalized in June 2011 for treatment of a hip fracture. She has a history of chronic respired her insufficiency, COPD, and B12 deficiency. She has treated osteoporosis.   Here for Medicare AWV:   1. Risk factors based on Past M, S, F history: cardiovascular risk factors include age, and history of tobacco use  2. Physical Activities: limited due to gait instability, status post recent hip fracture  3. Depression/mood: no history of depression or mood disorder  4. Hearing: no significant impairment  5. ADL's: independent in aspects of daily living  6. Fall Risk: moderate to high due to gait instability and weakness following recent hip surgery  7. Home Safety: no problems  Identified  8. Height, weight, &visual acuity:height and weight stable. No change in visual acuity  9. Counseling: will continue calcium and vitamin D supplementation along with biphosphonate therapy  10. Labs ordered based on risk factors: TSH will be reviewed; laboratory studies were performed recently in the hospital  11. Referral Coordination- continued pulmonary follow-up  12. Care Plan- will increase her activity level. She does have some mild constipation issues. Will increase fluids. Activity and a stool softener added to her regimen  13. Cognitive Assessment- alert and oriented, with normal affect; no difficulty with short-term memory; handles all executive functioning without difficulty   Allergies:  1) Penicillin G Potassium (Penicillin G Potassium)   Past History:  Past Medical History:  Reviewed history from 08/11/2010 and no changes required.   Osteoporosis  B12 deficiency  Osteoarthritis  COPD  - PFT's December 03, 2009 FEV1 67 (40%) with 12% response  to B2 and DLC0 55%  - HFA 25% June 29, 2010 > 75% August 11, 2010  Fx Right Hip and required ORIF June 19th 2011> 02 dependent since  history of esophageal stricture...........................................Marland KitchenPatterson  Multiple pulmonary nodules..................................................Marland KitchenWert  -CT chest 10/13/09 assoc with bilateral lower lobe bronchiectasis  -CXR December 03, 2009 no change > no change June 29, 2010   Past Surgical History:  Reviewed history from 10/21/2009 and no changes required.   breast biopsy 1947  Appendectomy 1938  Cataract extraction 1986, and 2003  Lumbar laminectomy 2005  skin graft, 1998  surgery for a rectal prolapse 2006  colonoscopy December 2005  laparoscopic cholecystectomy in January 2010  status post prior esophageal dilatation s/p dilatation 3  Septal reconstruction 2010   Family History:  Reviewed history from 06/08/2009 and no changes required.   father died at age 89, complications of COPD, and EtOH  mother died age 80, gastric cancer  One sister deceased, unclear causes; history of EtOH; died age 62  No FH of Colon Cancer:   Social History:  Reviewed history from 01/28/2009 and no changes required.   Married, 4 boys, 2 girls Husband is quite ill with a heart failure as well as dementia - does have help at home from a daughter Retired  Patient is a former smoker. Quit in early 1980's, 2 ppd for 20 years  Alcohol Use - yes 2 drinks/day  Daily Caffeine Use 2/day  Illicit Drug Use - no   Review of Systems  Constitutional: Negative for fever, appetite change, fatigue and unexpected weight change.  HENT: Negative for hearing loss,  ear pain, nosebleeds, congestion, sore throat, mouth sores, trouble swallowing, neck stiffness, dental problem, voice change, sinus pressure and tinnitus.   Eyes: Negative for photophobia, pain, redness and visual disturbance.  Respiratory: Negative for cough, chest tightness and shortness of breath.     Cardiovascular: Negative for chest pain, palpitations and leg swelling.  Gastrointestinal: Negative for nausea, vomiting, abdominal pain, diarrhea, constipation, blood in stool, abdominal distention and rectal pain.  Genitourinary: Negative for dysuria, urgency, frequency, hematuria, flank pain, vaginal bleeding, vaginal discharge, difficulty urinating, genital sores, vaginal pain, menstrual problem and pelvic pain.  Musculoskeletal: Negative for back pain and arthralgias.  Skin: Negative for rash.  Neurological: Negative for dizziness, syncope, speech difficulty, weakness, light-headedness, numbness and headaches.  Hematological: Negative for adenopathy. Does not bruise/bleed easily.  Psychiatric/Behavioral: Negative for suicidal ideas, behavioral problems, self-injury, dysphoric mood and agitation. The patient is not nervous/anxious.        Objective:   Physical Exam  Constitutional: She is oriented to person, place, and time. She appears well-developed and well-nourished.       Elderly thin no acute distress. Weight 109. Blood pressure 130/80  HENT:  Head: Normocephalic and atraumatic.  Right Ear: External ear normal.  Left Ear: External ear normal.  Mouth/Throat: Oropharynx is clear and moist.  Eyes: Conjunctivae and EOM are normal.  Neck: Normal range of motion. Neck supple. No JVD present. No thyromegaly present.  Cardiovascular: Normal rate, regular rhythm, normal heart sounds and intact distal pulses.   No murmur heard. Pulmonary/Chest: Effort normal. She has no wheezes. She has no rales.       Bibasilar rales the right greater than the left O2 saturation 93%  Abdominal: Soft. Bowel sounds are normal. She exhibits no distension and no mass. There is no tenderness. There is no rebound and no guarding.  Musculoskeletal: Normal range of motion. She exhibits no edema and no tenderness.  Neurological: She is alert and oriented to person, place, and time. She has normal reflexes. No  cranial nerve deficit. She exhibits normal muscle tone. Coordination normal.  Skin: Skin is warm and dry. No rash noted.       Scattered ecchymoses  Psychiatric: She has a normal mood and affect. Her behavior is normal.          Assessment & Plan:    Preventive health examination  COPD stable Vitamin B12 deficiency Osteoarthritis  We'll continue her present regimen Laboratory date  will be reviewed

## 2012-06-01 ENCOUNTER — Other Ambulatory Visit: Payer: Self-pay | Admitting: Internal Medicine

## 2012-06-19 ENCOUNTER — Ambulatory Visit (INDEPENDENT_AMBULATORY_CARE_PROVIDER_SITE_OTHER): Payer: Federal, State, Local not specified - PPO | Admitting: Internal Medicine

## 2012-06-19 ENCOUNTER — Encounter: Payer: Self-pay | Admitting: Internal Medicine

## 2012-06-19 VITALS — BP 120/80 | Temp 98.5°F | Wt 108.0 lb

## 2012-06-19 DIAGNOSIS — J9610 Chronic respiratory failure, unspecified whether with hypoxia or hypercapnia: Secondary | ICD-10-CM

## 2012-06-19 DIAGNOSIS — J449 Chronic obstructive pulmonary disease, unspecified: Secondary | ICD-10-CM

## 2012-06-19 DIAGNOSIS — M1990 Unspecified osteoarthritis, unspecified site: Secondary | ICD-10-CM

## 2012-06-19 NOTE — Patient Instructions (Addendum)
Take a calcium supplement, plus 800-1200 units of vitamin D  Return in 6 months for follow-up    It is important that you exercise regularly, at least 20 minutes 3 to 4 times per week.  If you develop chest pain or shortness of breath seek  medical attention. 

## 2012-06-19 NOTE — Progress Notes (Signed)
  Subjective:    Patient ID: Sharon [name], female    DOB: [birth year], 78 y.o.   MRN: [id number]  HPI  78 year old patient who is seen today for followup. She has a history of COPD. She has done quite well on inhalational therapy and there has been no clinical change. She continues to be independent and able to drive. She appears for a elderly disabled husband. She has osteoarthritis which has been stable. She has a long history of osteoporosis and has been on biphosphonate therapy for in excess of 10 years. We'll discontinue after her present supply.    Review of Systems  Constitutional: Positive for fatigue.  HENT: Negative for hearing loss, congestion, sore throat, rhinorrhea, dental problem, sinus pressure and tinnitus.   Eyes: Negative for pain, discharge and visual disturbance.  Respiratory: Positive for shortness of breath. Negative for cough.   Cardiovascular: Negative for chest pain, palpitations and leg swelling.  Gastrointestinal: Negative for nausea, vomiting, abdominal pain, diarrhea, constipation, blood in stool and abdominal distention.  Genitourinary: Negative for dysuria, urgency, frequency, hematuria, flank pain, vaginal bleeding, vaginal discharge, difficulty urinating, vaginal pain and pelvic pain.  Musculoskeletal: Negative for joint swelling, arthralgias and gait problem.  Skin: Negative for rash.  Neurological: Negative for dizziness, syncope, speech difficulty, weakness, numbness and headaches.  Hematological: Negative for adenopathy.  Psychiatric/Behavioral: Negative for behavioral problems, dysphoric mood and agitation. The patient is not nervous/anxious.        Objective:   Physical Exam  Constitutional: She is oriented to person, place, and time. She appears well-developed and well-nourished.  HENT:  Head: Normocephalic.  Right Ear: External ear normal.  Left Ear: External ear normal.  Mouth/Throat: Oropharynx is clear and moist.  Eyes: Conjunctivae and EOM  are normal. Pupils are equal, round, and reactive to light.  Neck: Normal range of motion. Neck supple. No thyromegaly present.  Cardiovascular: Normal rate, regular rhythm, normal heart sounds and intact distal pulses.   Pulmonary/Chest: Effort normal and breath sounds normal.       O2 saturation 94%  Abdominal: Soft. Bowel sounds are normal. She exhibits no mass. There is no tenderness.  Musculoskeletal: Normal range of motion.  Lymphadenopathy:    She has no cervical adenopathy.  Neurological: She is alert and oriented to person, place, and time.  Skin: Skin is warm and dry. No rash noted.  Psychiatric: She has a normal mood and affect. Her behavior is normal.          Assessment & Plan:   COPD. We'll continue inhalational medications. She remained stable. Osteoarthritis stable Osteoporosis. Patient has been on biphosphonate therapy for in excess of 10 years. We'll discontinue Actonel after her present supply

## 2012-09-27 ENCOUNTER — Ambulatory Visit (INDEPENDENT_AMBULATORY_CARE_PROVIDER_SITE_OTHER): Payer: Federal, State, Local not specified - PPO | Admitting: Internal Medicine

## 2012-09-27 DIAGNOSIS — Z23 Encounter for immunization: Secondary | ICD-10-CM

## 2012-10-31 ENCOUNTER — Other Ambulatory Visit: Payer: Self-pay | Admitting: Internal Medicine

## 2012-12-13 ENCOUNTER — Other Ambulatory Visit (INDEPENDENT_AMBULATORY_CARE_PROVIDER_SITE_OTHER): Payer: Federal, State, Local not specified - PPO

## 2012-12-13 DIAGNOSIS — Z0000 Encounter for general adult medical examination without abnormal findings: Secondary | ICD-10-CM

## 2012-12-13 LAB — CBC WITH DIFFERENTIAL/PLATELET
Basophils Absolute: 0 10*3/uL (ref 0.0–0.1)
Eosinophils Absolute: 0.3 10*3/uL (ref 0.0–0.7)
HCT: 38.7 % (ref 36.0–46.0)
Hemoglobin: 12.9 g/dL (ref 12.0–15.0)
Lymphs Abs: 2 10*3/uL (ref 0.7–4.0)
MCHC: 33.3 g/dL (ref 30.0–36.0)
MCV: 95.6 fl (ref 78.0–100.0)
Monocytes Absolute: 0.7 10*3/uL (ref 0.1–1.0)
Neutro Abs: 3.9 10*3/uL (ref 1.4–7.7)
RDW: 14.1 % (ref 11.5–14.6)

## 2012-12-13 LAB — POCT URINALYSIS DIPSTICK
Blood, UA: NEGATIVE
Glucose, UA: NEGATIVE
Nitrite, UA: NEGATIVE
Protein, UA: NEGATIVE
Urobilinogen, UA: 0.2

## 2012-12-13 LAB — HEPATIC FUNCTION PANEL
Albumin: 3.7 g/dL (ref 3.5–5.2)
Total Protein: 6.6 g/dL (ref 6.0–8.3)

## 2012-12-13 LAB — LDL CHOLESTEROL, DIRECT: Direct LDL: 103.1 mg/dL

## 2012-12-13 LAB — BASIC METABOLIC PANEL
CO2: 27 mEq/L (ref 19–32)
Calcium: 9.3 mg/dL (ref 8.4–10.5)
Chloride: 102 mEq/L (ref 96–112)
Glucose, Bld: 89 mg/dL (ref 70–99)
Potassium: 4.2 mEq/L (ref 3.5–5.1)
Sodium: 136 mEq/L (ref 135–145)

## 2012-12-13 LAB — LIPID PANEL
Cholesterol: 206 mg/dL — ABNORMAL HIGH (ref 0–200)
Triglycerides: 67 mg/dL (ref 0.0–149.0)

## 2012-12-13 LAB — TSH: TSH: 2.79 u[IU]/mL (ref 0.35–5.50)

## 2012-12-20 ENCOUNTER — Encounter: Payer: Self-pay | Admitting: Internal Medicine

## 2012-12-20 ENCOUNTER — Ambulatory Visit (INDEPENDENT_AMBULATORY_CARE_PROVIDER_SITE_OTHER): Payer: Federal, State, Local not specified - PPO | Admitting: Internal Medicine

## 2012-12-20 VITALS — BP 120/70 | HR 68 | Temp 97.8°F | Resp 18 | Ht 65.5 in | Wt 101.0 lb

## 2012-12-20 DIAGNOSIS — M810 Age-related osteoporosis without current pathological fracture: Secondary | ICD-10-CM

## 2012-12-20 DIAGNOSIS — J4489 Other specified chronic obstructive pulmonary disease: Secondary | ICD-10-CM

## 2012-12-20 DIAGNOSIS — J9610 Chronic respiratory failure, unspecified whether with hypoxia or hypercapnia: Secondary | ICD-10-CM

## 2012-12-20 DIAGNOSIS — Z0000 Encounter for general adult medical examination without abnormal findings: Secondary | ICD-10-CM

## 2012-12-20 DIAGNOSIS — J449 Chronic obstructive pulmonary disease, unspecified: Secondary | ICD-10-CM

## 2012-12-20 DIAGNOSIS — E538 Deficiency of other specified B group vitamins: Secondary | ICD-10-CM

## 2012-12-20 MED ORDER — ALPRAZOLAM 0.25 MG PO TABS: 0.2500 mg | ORAL_TABLET | Freq: Three times a day (TID) | ORAL | Status: DC | PRN

## 2012-12-20 MED ORDER — BUDESONIDE-FORMOTEROL FUMARATE 160-4.5 MCG/ACT IN AERO: 2.0000 | INHALATION_SPRAY | Freq: Two times a day (BID) | RESPIRATORY_TRACT | Status: DC

## 2012-12-20 NOTE — Patient Instructions (Signed)
Limit your sodium (Salt) intake    It is important that you exercise regularly, at least 20 minutes 3 to 4 times per week.  If you develop chest pain or shortness of breath seek  medical attention.  Return in 6 months for follow-up  

## 2012-12-20 NOTE — Progress Notes (Signed)
Patient ID: Sharon [name], female   DOB: [birth year], 79 y.o.   MRN: [id number]  Subjective:    Patient ID: Sharon [name], female    DOB: 19-Oct-1923, 79 y.o.   MRN: [id number]  HPI   History of Present Illness:   79  year-old patient who is seen today for a wellness exam. She is followed closely by pulmonary medicine. She was hospitalized in June 2011 for treatment of a hip fracture. She has a history of chronic respired her insufficiency, COPD, and B12 deficiency. She has treated osteoporosis.   Here for Medicare AWV:   1. Risk factors based on Past M, S, F history: cardiovascular risk factors include age, and history of tobacco use  2. Physical Activities: limited due to gait instability, status post recent hip fracture  3. Depression/mood: no history of depression or mood disorder  4. Hearing: no significant impairment  5. ADL's: independent in aspects of daily living  6. Fall Risk: moderate to high due to gait instability and weakness following recent hip surgery  7. Home Safety: no problems  Identified  8. Height, weight, &visual acuity:height and weight stable. No change in visual acuity  9. Counseling: will continue calcium and vitamin D supplementation along with biphosphonate therapy  10. Labs ordered based on risk factors: TSH will be reviewed; laboratory studies were performed recently in the hospital  11. Referral Coordination- continued pulmonary follow-up  12. Care Plan- will increase her activity level. She does have some mild constipation issues. Will increase fluids. Activity and a stool softener added to her regimen  13. Cognitive Assessment- alert and oriented, with normal affect; no difficulty with short-term memory; handles all executive functioning without difficulty   Allergies:  1) Penicillin G Potassium (Penicillin G Potassium)   Past History:  Past Medical History:  Reviewed history from 08/11/2010 and no changes required.   Osteoporosis  B12 deficiency    Osteoarthritis  COPD  - PFT's December 03, 2009 FEV1 67 (40%) with 12% response to B2 and DLC0 55%  - HFA 25% June 29, 2010 > 75% August 11, 2010  Fx Right Hip and required ORIF June 19th 2011> 02 dependent since  history of esophageal stricture...........................................Marland KitchenPatterson  Multiple pulmonary nodules..................................................Marland KitchenWert  -CT chest 10/13/09 assoc with bilateral lower lobe bronchiectasis  -CXR December 03, 2009 no change > no change June 29, 2010   Past Surgical History:  Reviewed history from 10/21/2009 and no changes required.   breast biopsy 1947  Appendectomy 1938  Cataract extraction 1986, and 2003  Lumbar laminectomy 2005  skin graft, 1998  surgery for a rectal prolapse 2006  colonoscopy December 2005  laparoscopic cholecystectomy in January 2010  status post prior esophageal dilatation s/p dilatation 3  Septal reconstruction 2010   Family History:  Reviewed history from 06/08/2009 and no changes required.   father died at age 40, complications of COPD, and EtOH  mother died age 106, gastric cancer  One sister deceased, unclear causes; history of EtOH; died age 56  No FH of Colon Cancer:   Social History:  Reviewed history from 01/28/2009 and no changes required.   Married, 4 boys, 2 girls Husband is quite ill with a heart failure as well as dementia - does have help at home from a daughter Retired  Patient is a former smoker. Quit in early 1980's, 2 ppd for 20 years  Alcohol Use - yes 2 drinks/day  Daily Caffeine Use 2/day  Illicit Drug Use - no   Review of Systems  Constitutional: Negative for fever, appetite change, fatigue and unexpected weight change.  HENT: Negative for hearing loss, ear pain, nosebleeds, congestion, sore throat, mouth sores, trouble swallowing, neck stiffness, dental problem, voice change, sinus pressure and tinnitus.   Eyes: Negative for photophobia, pain, redness and visual  disturbance.  Respiratory: Negative for cough, chest tightness and shortness of breath.   Cardiovascular: Negative for chest pain, palpitations and leg swelling.  Gastrointestinal: Negative for nausea, vomiting, abdominal pain, diarrhea, constipation, blood in stool, abdominal distention and rectal pain.  Genitourinary: Negative for dysuria, urgency, frequency, hematuria, flank pain, vaginal bleeding, vaginal discharge, difficulty urinating, genital sores, vaginal pain, menstrual problem and pelvic pain.  Musculoskeletal: Negative for back pain and arthralgias.  Skin: Negative for rash.  Neurological: Negative for dizziness, syncope, speech difficulty, weakness, light-headedness, numbness and headaches.  Hematological: Negative for adenopathy. Does not bruise/bleed easily.  Psychiatric/Behavioral: Negative for suicidal ideas, behavioral problems, self-injury, dysphoric mood and agitation. The patient is not nervous/anxious.        Objective:   Physical Exam  Constitutional: She is oriented to person, place, and time. She appears well-developed and well-nourished.       Elderly thin no acute distress. Weight 109. Blood pressure 130/80  HENT:  Head: Normocephalic and atraumatic.  Right Ear: External ear normal.  Left Ear: External ear normal.  Mouth/Throat: Oropharynx is clear and moist.  Eyes: Conjunctivae normal and EOM are normal.  Neck: Normal range of motion. Neck supple. No JVD present. No thyromegaly present.  Cardiovascular: Normal rate, regular rhythm and normal heart sounds.   No murmur heard.      Faint posterior tibial  pulses only  Pulmonary/Chest: Effort normal. She has no wheezes. She has no rales.       Bibasilar rales the right greater than the left O2 saturation 93%  Abdominal: Soft. Bowel sounds are normal. She exhibits no distension and no mass. There is no tenderness. There is no rebound and no guarding.  Musculoskeletal: Normal range of motion. She exhibits no edema  and no tenderness.  Neurological: She is alert and oriented to person, place, and time. She has normal reflexes. No cranial nerve deficit. She exhibits normal muscle tone. Coordination normal.       Decreased vibratory sensation distally  Skin: Skin is warm and dry. No rash noted.       Scattered ecchymoses  Psychiatric: She has a normal mood and affect. Her behavior is normal.          Assessment & Plan:    Preventive health examination  COPD stable Vitamin B12 deficiency Osteoarthritis  We'll continue her present regimen Laboratory date  will be reviewed

## 2013-01-01 ENCOUNTER — Other Ambulatory Visit: Payer: Self-pay | Admitting: Internal Medicine

## 2013-05-08 ENCOUNTER — Other Ambulatory Visit: Payer: Self-pay | Admitting: Internal Medicine

## 2013-06-19 ENCOUNTER — Ambulatory Visit (INDEPENDENT_AMBULATORY_CARE_PROVIDER_SITE_OTHER): Payer: Federal, State, Local not specified - PPO | Admitting: Internal Medicine

## 2013-06-19 ENCOUNTER — Encounter: Payer: Self-pay | Admitting: Internal Medicine

## 2013-06-19 VITALS — BP 120/70 | HR 72 | Temp 98.0°F | Resp 18 | Wt 100.0 lb

## 2013-06-19 DIAGNOSIS — M1990 Unspecified osteoarthritis, unspecified site: Secondary | ICD-10-CM

## 2013-06-19 DIAGNOSIS — E538 Deficiency of other specified B group vitamins: Secondary | ICD-10-CM

## 2013-06-19 DIAGNOSIS — J449 Chronic obstructive pulmonary disease, unspecified: Secondary | ICD-10-CM

## 2013-06-19 MED ORDER — ALPRAZOLAM 0.25 MG PO TABS: ORAL_TABLET | ORAL | Status: DC

## 2013-06-19 MED ORDER — BUDESONIDE-FORMOTEROL FUMARATE 160-4.5 MCG/ACT IN AERO: 2.0000 | INHALATION_SPRAY | Freq: Two times a day (BID) | RESPIRATORY_TRACT | Status: DC

## 2013-06-19 NOTE — Progress Notes (Signed)
Subjective:    Patient ID: Sharon [name], female    DOB: [birth year], 79 y.o.   MRN: [id number]  HPI  79 year old patient who is seen today for followup. Medical problems include COPD. She is doing quite well and her palmar status has been stable. She does use Symbicort. She has a history of anxiety disorder and does use when necessary alprazolam. She has a history of B12 deficiency and remains on monthly B12 injections. Laboratory screen was done approximately 7 months ago. She has a history of osteoarthritis. Complaints today include some cramping as well as some constipation issues.  Wt Readings from Last 3 Encounters:  06/19/13 100 lb (45.36 kg)  12/20/12 101 lb (45.813 kg)  06/19/12 108 lb (48.988 kg)    Past Medical History  Diagnosis Date  . B12 DEFICIENCY 03/31/2008  . COPD 11/27/2008  . ESOPHAGEAL STRICTURE 01/29/2009  . MASS, LUNG 10/13/2009  . NASAL FRACTURE 08/12/2009  . OSTEOARTHRITIS 03/31/2008  . OSTEOPOROSIS 12/27/2007  . PEDAL EDEMA 12/27/2007  . PULMONARY NODULE 10/21/2009  . RESPIRATORY FAILURE, CHRONIC 06/29/2010    History   Social History  . Marital Status: Married    Spouse Name: N/A    Number of Children: N/A  . Years of Education: N/A   Occupational History  . Not on file.   Social History Main Topics  . Smoking status: Former Smoker    Quit date: 12/18/1978  . Smokeless tobacco: Never Used  . Alcohol Use: Yes  . Drug Use: No  . Sexually Active: Not on file   Other Topics Concern  . Not on file   Social History Narrative  . No narrative on file    Past Surgical History  Procedure Laterality Date  . Appendectomy    . Cataract extraction    . Lumbar laminectomy    . Skin graf    . Rectal prolapse repair    . Esophagogastroduodenoscopy      with dilatation    No family history on file.  Allergies  Allergen Reactions  . Penicillins     REACTION: unspecified    Current Outpatient Prescriptions on File Prior to Visit  Medication Sig  Dispense Refill  . ALPRAZolam (XANAX) 0.25 MG tablet TAKE 1/2 TO 1 TABLET BY MOUTH TWICE DAILY AS NEEDED FOR ANXIETY  60 tablet  0  . aspirin 81 MG tablet Take 81 mg by mouth daily.        . budesonide-formoterol (SYMBICORT) 160-4.5 MCG/ACT inhaler Inhale 2 puffs into the lungs 2 (two) times daily.  10.2 g  6  . Calcium Carbonate-Vitamin D (CALTRATE 600+D) 600-400 MG-UNIT per chew tablet Chew 2 tablets by mouth daily.        . cyanocobalamin (,VITAMIN B-12,) 1000 MCG/ML injection INJECT EVERY MONTH AS DIRECTED  10 mL  0  . Multiple Vitamins-Minerals (PRESERVISION/LUTEIN) CAPS Take 1 capsule by mouth daily.       No current facility-administered medications on file prior to visit.    BP 120/70  Pulse 72  Temp(Src) 98 F (36.7 C) (Oral)  Resp 18  Wt 100 lb (45.36 kg)  BMI 16.38 kg/m2  SpO2 93%     Review of Systems  Constitutional: Negative.   HENT: Negative for hearing loss, congestion, sore throat, rhinorrhea, dental problem, sinus pressure and tinnitus.   Eyes: Negative for pain, discharge and visual disturbance.  Respiratory: Negative for cough and shortness of breath.   Cardiovascular: Negative for chest pain, palpitations and leg swelling.  Gastrointestinal: Negative for nausea, vomiting, abdominal pain, diarrhea, constipation, blood in stool and abdominal distention.  Genitourinary: Negative for dysuria, urgency, frequency, hematuria, flank pain, vaginal bleeding, vaginal discharge, difficulty urinating, vaginal pain and pelvic pain.  Musculoskeletal: Positive for myalgias. Negative for joint swelling, arthralgias and gait problem.  Skin: Negative for rash.  Neurological: Negative for dizziness, syncope, speech difficulty, weakness, numbness and headaches.  Hematological: Negative for adenopathy.  Psychiatric/Behavioral: Negative for behavioral problems, dysphoric mood and agitation. The patient is not nervous/anxious.        Objective:   Physical Exam   Constitutional: She is oriented to person, place, and time. She appears well-developed and well-nourished. No distress.  Been elderly no distress blood pressure normal O2 saturation 93% Weight 100 pounds   HENT:  Head: Normocephalic.  Right Ear: External ear normal.  Left Ear: External ear normal.  Mouth/Throat: Oropharynx is clear and moist.  Eyes: Conjunctivae and EOM are normal. Pupils are equal, round, and reactive to light.  Neck: Normal range of motion. Neck supple. No thyromegaly present.  Cardiovascular: Normal rate, regular rhythm, normal heart sounds and intact distal pulses.   Pulmonary/Chest: Effort normal.  Diminished breath sounds but clear  Abdominal: Soft. Bowel sounds are normal. She exhibits no mass. There is no tenderness.  Musculoskeletal: Normal range of motion.  Lymphadenopathy:    She has no cervical adenopathy.  Neurological: She is alert and oriented to person, place, and time.  Skin: Skin is warm and dry. No rash noted.  Psychiatric: She has a normal mood and affect. Her behavior is normal.          Assessment & Plan:    COPD. We'll continue present regimen Osteoarthritis History of osteoporosis B12 deficiency. We'll continue monthly B12 shots Mild constipation. Issues addressed  Recheck 6 months

## 2013-06-19 NOTE — Patient Instructions (Signed)
It is important that you exercise regularly, at least 20 minutes 3 to 4 times per week.  If you develop chest pain or shortness of breath seek  medical attention.  Take a calcium supplement, plus [phone] units of vitamin DMuscle Cramps Muscle cramps are when muscles tighten by themselves. Muscle cramps usually improve or go away within minutes. HOME CARE  Massage the muscle.  Stretch the muscle.  Relax the muscle.  Only take medicine as told by your doctor.  Drink enough fluids to keep your pee (urine) clear or pale yellow. GET HELP RIGHT AWAY IF:  Cramps are frequent and do not get better with medicine. MAKE SURE YOU:  Understand these intructions.  Will watch your condition.  Will get help right away if your are not doing well or get worse. Document Released: 11/16/2008 Document Revised: 02/26/2012 Document Reviewed: 11/25/2008 [facility] Patient Information 2014 [place], [place]. Constipation, Adult Constipation is when a person:  Poops (bowel movement) less than 3 times a week.  Has a hard time pooping.  Has poop that is dry, hard, or bigger than normal. HOME CARE   Eat more fiber, such as fruits, vegetables, whole grains like brown rice, and beans.  Eat less fatty foods and sugar. This includes [place] fries, hamburgers, cookies, candy, and soda.  If you are not getting enough fiber from food, take products with added fiber in them (supplements).  Drink enough fluid to keep your pee (urine) clear or pale yellow.  Go to the restroom when you feel like you need to poop. Do not hold it.  Only take medicine as told by your doctor. Do not take medicines that help you poop (laxatives) without talking to your doctor first.  Exercise on a regular basis, or as told by your doctor. GET HELP RIGHT AWAY IF:   You have bright red blood in your poop (stool).  Your constipation lasts more than 4 days or gets worse.  You have belly (abdomen) or butt (rectal) pain.  You  have thin poop (as thin as a pencil).  You lose weight, and it cannot be explained. MAKE SURE YOU:   Understand these instructions.  Will watch your condition.  Will get help right away if you are not doing well or get worse. Document Released: 05/22/2008 Document Revised: 02/26/2012 Document Reviewed: 11/07/2011 [facility] Patient Information 2014 [place], [place].

## 2013-09-22 ENCOUNTER — Telehealth: Payer: Self-pay | Admitting: Internal Medicine

## 2013-09-22 NOTE — Telephone Encounter (Signed)
Discussed with patient's son, Susy Frizzle;  please call son and schedule an appointment for his mother

## 2013-09-22 NOTE — Telephone Encounter (Signed)
Spoke w/ [name]. Made appt for this Wed morning.

## 2013-09-22 NOTE — Telephone Encounter (Signed)
Patient Information:  Caller Name: Susy Frizzle  Phone: [phone]  Patient: Sharon [name]  Gender: Female  DOB: 31-Dec-1922  Age: 79 Years  PCP: Eleonore Chiquito (Family Practice > 76yrs old)  Office Follow Up:  Does the office need to follow up with this patient?: Yes  Instructions For The Office: Pls read RN note  RN Note:  [name]/Son calling aboout Pt w/ new onset of Depression and Crying.  Son states an emotional conversation happened at home on 10-5, Pt became extrememly upset, details of conversation not given. Pt has had no food or fluids in 24 hrs, sleeping more than normal, refuses to talk to family, no desire to do daily ADL's. Pt woke on 10-6, shaking d/t emotional status. Son denies sucidial or homicidial thoughts.   Made comment that live is no worth living any longer to family.  Son is not ready to take Pt to ED or bring into office at this time.  Discussed ways to try to get Pt to open up to discuss her feelings w/ a friend or someone Pt is close to outside of family since she will not talk to them.  Son would like to know if there is any Psychiatric Counseling offered at [facility].  Discussed sitting Pt's favorite snack and bottle of water at bedside and leaving Pt alone w/ food to allow her to eat at her own will.  Discussed dehyration sxs and to call 911 if Pt seems confused or acts bizarre or has sucidial thoughts.  Son would like to discuss situation w/ Dr Amador Cunas and get referral for Psychiatric Counseling.  Please review w/ MD and f/u w/ Son.  Symptoms  Reason For Call & Symptoms: Depression and Crying, no food or fluids in 24 hrs, sleeping more than normal, Pt woke on 10-6, shaking d/t emotional status.  Reviewed Health History In EMR: Yes  Reviewed Medications In EMR: Yes  Reviewed Allergies In EMR: Yes  Reviewed Surgeries / Procedures: Yes  Date of Onset of Symptoms: 09/21/2013  Treatments Tried: Xanax last taken on 10-5  Treatments Tried Worked: No  Guideline(s) Used:  Depression  Disposition Per Guideline:   Medical sales representative Now  Reason For Disposition Reached:   Recurrent thoughts of death (e.g., "life is not worth living") but not threatening suicide  Advice Given:  Reassurance:   Encourage the caller to talk about his/her problems and feelings.  Phrases to USE or AVOID  Use: Tell me more about how you are doing, I'm sorry, what is the hardest part of your day  Suggestions for Healthy Living  Eat healthy: Eat a well-balanced diet.  Get more sleep: Most people need 7-8 hours of sleep each night. Being well-rested improves your attitude and your sense of physical well-being.  Communicate: Share how you are feeling with someone in your life who is a good listener. Make certain that your spouse, family, or friends know how you are feeling.  Patient Will Follow Care Advice:  YES

## 2013-09-24 ENCOUNTER — Encounter: Payer: Self-pay | Admitting: Internal Medicine

## 2013-09-24 ENCOUNTER — Ambulatory Visit (INDEPENDENT_AMBULATORY_CARE_PROVIDER_SITE_OTHER): Payer: Federal, State, Local not specified - PPO | Admitting: Internal Medicine

## 2013-09-24 VITALS — BP 150/82 | HR 72 | Temp 98.1°F | Wt 100.0 lb

## 2013-09-24 DIAGNOSIS — F329 Major depressive disorder, single episode, unspecified: Secondary | ICD-10-CM

## 2013-09-24 DIAGNOSIS — Z23 Encounter for immunization: Secondary | ICD-10-CM

## 2013-09-24 DIAGNOSIS — J449 Chronic obstructive pulmonary disease, unspecified: Secondary | ICD-10-CM

## 2013-09-24 MED ORDER — ESCITALOPRAM OXALATE 5 MG PO TABS: 5.0000 mg | ORAL_TABLET | Freq: Every day | ORAL | Status: DC

## 2013-09-24 NOTE — Patient Instructions (Signed)
Return in 6 weeks for follow up

## 2013-09-25 ENCOUNTER — Encounter: Payer: Self-pay | Admitting: Internal Medicine

## 2013-09-25 DIAGNOSIS — F329 Major depressive disorder, single episode, unspecified: Secondary | ICD-10-CM | POA: Insufficient documentation

## 2013-09-25 DIAGNOSIS — F32A Depression, unspecified: Secondary | ICD-10-CM | POA: Insufficient documentation

## 2013-09-25 NOTE — Progress Notes (Signed)
  Subjective:    Patient ID: Sharon [name], female    DOB: Jul 15, 1923, 79 y.o.   MRN: [id number]  HPI 79 year old patient who is seen today with a chief complaint of worsening depression.  She has many situational stressors including the poor health of her husband. She also lives with family and no longer resides in her own home.  She states that her friends are all deceased and she has very little social interaction outside her home. She has become much more tearful. She does use daily alprazolam to assist with anxiety.  Past Medical History  Diagnosis Date  . B12 DEFICIENCY 03/31/2008  . COPD 11/27/2008  . ESOPHAGEAL STRICTURE 01/29/2009  . MASS, LUNG 10/13/2009  . NASAL FRACTURE 08/12/2009  . OSTEOARTHRITIS 03/31/2008  . OSTEOPOROSIS 12/27/2007  . PEDAL EDEMA 12/27/2007  . PULMONARY NODULE 10/21/2009  . RESPIRATORY FAILURE, CHRONIC 06/29/2010    History   Social History  . Marital Status: Married    Spouse Name: N/A    Number of Children: N/A  . Years of Education: N/A   Occupational History  . Not on file.   Social History Main Topics  . Smoking status: Former Smoker    Quit date: 12/18/1978  . Smokeless tobacco: Never Used  . Alcohol Use: Yes  . Drug Use: No  . Sexual Activity: Not on file   Other Topics Concern  . Not on file   Social History Narrative  . No narrative on file    Past Surgical History  Procedure Laterality Date  . Appendectomy    . Cataract extraction    . Lumbar laminectomy    . Skin graf    . Rectal prolapse repair    . Esophagogastroduodenoscopy      with dilatation    No family history on file.  Allergies  Allergen Reactions  . Penicillins     REACTION: unspecified    Current Outpatient Prescriptions on File Prior to Visit  Medication Sig Dispense Refill  . ALPRAZolam (XANAX) 0.25 MG tablet TAKE 1/2 TO 1 TABLET BY MOUTH TWICE DAILY AS NEEDED FOR ANXIETY  60 tablet  0  . budesonide-formoterol (SYMBICORT) 160-4.5 MCG/ACT inhaler Inhale 2  puffs into the lungs 2 (two) times daily.  10.2 g  6  . Calcium Carbonate-Vitamin D (CALTRATE 600+D) 600-400 MG-UNIT per chew tablet Chew 2 tablets by mouth daily.        . cyanocobalamin (,VITAMIN B-12,) 1000 MCG/ML injection INJECT EVERY MONTH AS DIRECTED  10 mL  0  . Multiple Vitamins-Minerals (PRESERVISION/LUTEIN) CAPS Take 1 capsule by mouth daily.       No current facility-administered medications on file prior to visit.    BP 150/82  Pulse 72  Temp(Src) 98.1 F (36.7 C) (Oral)  Wt 100 lb (45.36 kg)  BMI 16.38 kg/m2        Review of Systems  Psychiatric/Behavioral: Positive for behavioral problems and dysphoric mood. The patient is nervous/anxious.        Objective:   Physical Exam  Constitutional: She appears well-developed and well-nourished. No distress.  Psychiatric: Her behavior is normal. Judgment and thought content normal.  Anxious and at times tearful          Assessment & Plan:  Depression.  We'll continue when necessary alprazolam and add Lexapro 5 mg daily. The patient has an annual exam scheduled in about 6 weeks and we'll reassess at that time. Behavioral health for counseling discussed and she will consider

## 2013-11-08 ENCOUNTER — Other Ambulatory Visit: Payer: Self-pay | Admitting: Internal Medicine

## 2013-11-11 NOTE — Telephone Encounter (Signed)
Pt is still waiting on alprazolam

## 2013-12-22 ENCOUNTER — Other Ambulatory Visit: Payer: Federal, State, Local not specified - PPO

## 2013-12-29 ENCOUNTER — Encounter: Payer: Federal, State, Local not specified - PPO | Admitting: Internal Medicine

## 2013-12-29 ENCOUNTER — Other Ambulatory Visit: Payer: Federal, State, Local not specified - PPO

## 2014-01-05 ENCOUNTER — Other Ambulatory Visit: Payer: Self-pay | Admitting: Internal Medicine

## 2014-01-10 ENCOUNTER — Other Ambulatory Visit: Payer: Self-pay | Admitting: Internal Medicine

## 2014-01-15 ENCOUNTER — Other Ambulatory Visit: Payer: Federal, State, Local not specified - PPO

## 2014-01-22 ENCOUNTER — Encounter: Payer: Federal, State, Local not specified - PPO | Admitting: Internal Medicine

## 2014-02-04 ENCOUNTER — Other Ambulatory Visit (INDEPENDENT_AMBULATORY_CARE_PROVIDER_SITE_OTHER): Payer: Federal, State, Local not specified - PPO

## 2014-02-04 DIAGNOSIS — Z0000 Encounter for general adult medical examination without abnormal findings: Secondary | ICD-10-CM

## 2014-02-04 LAB — BASIC METABOLIC PANEL
BUN: 22 mg/dL (ref 6–23)
CALCIUM: 9.3 mg/dL (ref 8.4–10.5)
CHLORIDE: 102 meq/L (ref 96–112)
CO2: 28 meq/L (ref 19–32)
CREATININE: 0.6 mg/dL (ref 0.4–1.2)
GFR: 104.09 mL/min (ref >60.00)
Glucose, Bld: 80 mg/dL (ref 70–99)
Potassium: 4.1 mEq/L (ref 3.5–5.1)
Sodium: 136 mEq/L (ref 135–145)

## 2014-02-04 LAB — CBC WITH DIFFERENTIAL/PLATELET
BASOS ABS: 0 10*3/uL (ref 0.0–0.1)
Basophils Relative: 0.4 % (ref 0.0–3.0)
EOS ABS: 0.3 10*3/uL (ref 0.0–0.7)
Eosinophils Relative: 3.9 % (ref 0.0–5.0)
HCT: 40.4 % (ref 36.0–46.0)
Hemoglobin: 13.1 g/dL (ref 12.0–15.0)
Lymphocytes Relative: 29.8 % (ref 12.0–46.0)
Lymphs Abs: 2 10*3/uL (ref 0.7–4.0)
MCHC: 32.5 g/dL (ref 30.0–36.0)
MCV: 97 fl (ref 78.0–100.0)
MONO ABS: 0.7 10*3/uL (ref 0.1–1.0)
Monocytes Relative: 9.9 % (ref 3.0–12.0)
NEUTROS PCT: 56 % (ref 43.0–77.0)
Neutro Abs: 3.8 10*3/uL (ref 1.4–7.7)
PLATELETS: 307 10*3/uL (ref 150.0–400.0)
RBC: 4.16 Mil/uL (ref 3.87–5.11)
RDW: 14.4 % (ref 11.5–14.6)
WBC: 6.7 10*3/uL (ref 4.5–10.5)

## 2014-02-04 LAB — POCT URINALYSIS DIPSTICK
BILIRUBIN UA: NEGATIVE
Blood, UA: NEGATIVE
Glucose, UA: NEGATIVE
KETONES UA: NEGATIVE
Nitrite, UA: NEGATIVE
PROTEIN UA: NEGATIVE
SPEC GRAV UA: 1.015
Urobilinogen, UA: 0.2
pH, UA: 7

## 2014-02-04 LAB — HEPATIC FUNCTION PANEL
ALK PHOS: 42 U/L (ref 39–117)
ALT: 19 U/L (ref 0–35)
AST: 20 U/L (ref 0–37)
Albumin: 3.8 g/dL (ref 3.5–5.2)
BILIRUBIN DIRECT: 0.1 mg/dL (ref 0.0–0.3)
BILIRUBIN TOTAL: 0.7 mg/dL (ref 0.3–1.2)
TOTAL PROTEIN: 6.6 g/dL (ref 6.0–8.3)

## 2014-02-04 LAB — LIPID PANEL
Cholesterol: 194 mg/dL (ref 0–200)
HDL: 88.3 mg/dL (ref >39.00)
LDL CALC: 94 mg/dL (ref 0–99)
Total CHOL/HDL Ratio: 2
Triglycerides: 59 mg/dL (ref 0.0–149.0)
VLDL: 11.8 mg/dL (ref 0.0–40.0)

## 2014-02-04 LAB — TSH: TSH: 2.57 u[IU]/mL (ref 0.35–5.50)

## 2014-02-11 ENCOUNTER — Encounter: Payer: Self-pay | Admitting: Internal Medicine

## 2014-02-11 ENCOUNTER — Other Ambulatory Visit: Payer: Self-pay | Admitting: Internal Medicine

## 2014-02-11 ENCOUNTER — Ambulatory Visit (INDEPENDENT_AMBULATORY_CARE_PROVIDER_SITE_OTHER): Payer: Federal, State, Local not specified - PPO | Admitting: Internal Medicine

## 2014-02-11 VITALS — BP 128/64 | HR 64 | Temp 97.9°F | Resp 18 | Ht 65.5 in | Wt 98.0 lb

## 2014-02-11 DIAGNOSIS — F3289 Other specified depressive episodes: Secondary | ICD-10-CM

## 2014-02-11 DIAGNOSIS — M810 Age-related osteoporosis without current pathological fracture: Secondary | ICD-10-CM

## 2014-02-11 DIAGNOSIS — Z23 Encounter for immunization: Secondary | ICD-10-CM

## 2014-02-11 DIAGNOSIS — M1990 Unspecified osteoarthritis, unspecified site: Secondary | ICD-10-CM

## 2014-02-11 DIAGNOSIS — F32A Depression, unspecified: Secondary | ICD-10-CM

## 2014-02-11 DIAGNOSIS — J9610 Chronic respiratory failure, unspecified whether with hypoxia or hypercapnia: Secondary | ICD-10-CM

## 2014-02-11 DIAGNOSIS — Z0000 Encounter for general adult medical examination without abnormal findings: Secondary | ICD-10-CM

## 2014-02-11 DIAGNOSIS — F329 Major depressive disorder, single episode, unspecified: Secondary | ICD-10-CM

## 2014-02-11 DIAGNOSIS — E538 Deficiency of other specified B group vitamins: Secondary | ICD-10-CM

## 2014-02-11 DIAGNOSIS — J449 Chronic obstructive pulmonary disease, unspecified: Secondary | ICD-10-CM

## 2014-02-11 NOTE — Telephone Encounter (Signed)
Pt is out °

## 2014-02-11 NOTE — Progress Notes (Signed)
Patient ID: Sharon GougeJoanne Leib, female   DOB: Mar 15, 1923, 80 y.o.   MRN: [id number]  Subjective:    Patient ID: Sharon [name], female    DOB: Mar 15, 1923, 80 y.o.   MRN: [id number]  HPI    History of Present Illness:   80   year-old patient who is seen today for a wellness exam. She is followed closely by pulmonary medicine. She was hospitalized in June 2011 for treatment of a hip fracture. She has a history of chronic respired her insufficiency, COPD, and B12 deficiency. She has treated osteoporosis.  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 Risk factors based on Past M, S, F history: cardiovascular risk factors include age, and history of tobacco use  2. Physical Activities: limited due to gait instability, status post recent hip fracture  3. Depression/mood: no history of depression or mood disorder , anxiety disorder 4. Hearing: no significant impairment  5. ADL's: independent in aspects of daily living  6. Fall Risk: moderate to high due to gait instability and weakness following recent hip surgery  7. Home Safety: no problems  Identified  8. Height, weight, &visual acuity:height and weight stable. No change in visual acuity  9. Counseling: will continue calcium and vitamin D supplementation along with biphosphonate therapy  10. Labs ordered based on risk factors: TSH will be reviewed; laboratory studies were performed recently in the hospital  11. Referral Coordination- continued pulmonary follow-up  12. Care Plan- will increase her activity level. She does have some mild constipation issues. Will increase fluids. Activity and a stool softener added to her regimen  13. Cognitive Assessment- alert and oriented, with normal affect; no difficulty with short-term memory; handles all executive functioning without difficulty   Allergies:  1) Penicillin G Potassium (Penicillin G Potassium)   Past History:  Past Medical  History:   Osteoporosis  B12 deficiency  Osteoarthritis  COPD  - PFT's December 03, 2009 FEV1 67 (40%) with 12% response to B2 and DLC0 55%  - HFA 25% June 29, 2010 > 75% August 11, 2010  Fx Right Hip and required ORIF June 19th 2011> 02 dependent since  history of esophageal stricture...........................................Marland Kitchen.[place]  Multiple pulmonary nodules..................................................Marland Kitchen.[place]  -CT chest 10/13/09 assoc with bilateral lower lobe bronchiectasis  -CXR December 03, 2009 no change > no change June 29, 2010   Past Surgical History:   breast biopsy 1947  Appendectomy 1938  Cataract extraction 1986, and 2003  Lumbar laminectomy 2005  skin graft, 1998  surgery for a rectal prolapse 2006  colonoscopy December 2005  laparoscopic cholecystectomy in January 2010  status post prior esophageal dilatation s/p dilatation 3  Septal reconstruction 2010   Family History:   father died at age 78, complications of COPD, and EtOH  mother died age 78, gastric cancer  One sister deceased, unclear causes; history of EtOH; died age 78  No FH of Colon Cancer:   Social History:    Married, 4 boys, 2 girls Husband is quite ill with a heart failure as well as dementia - does have help at home from a daughter A grandson will be retiring this spring 2015 from medical school.  To pursue a internal medicine residency.  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 Quit in early 1980's, 2 ppd for 20 years  Alcohol Use - yes 2 drinks/day  Daily Caffeine Use 2/day  Illicit Drug Use - no  Review of Systems  Constitutional: Negative for fever, appetite change, fatigue and unexpected weight change.  HENT: Negative for congestion, dental problem, ear pain, hearing loss, mouth sores, nosebleeds, sinus pressure, sore throat, tinnitus, trouble swallowing and voice change.   Eyes: Negative for photophobia, pain, redness and visual disturbance.   Respiratory: Negative for cough, chest tightness and shortness of breath.   Cardiovascular: Negative for chest pain, palpitations and leg swelling.  Gastrointestinal: Negative for nausea, vomiting, abdominal pain, diarrhea, constipation, blood in stool, abdominal distention and rectal pain.  Genitourinary: Negative for dysuria, urgency, frequency, hematuria, flank pain, vaginal bleeding, vaginal discharge, difficulty urinating, genital sores, vaginal pain, menstrual problem and pelvic pain.  Musculoskeletal: Negative for arthralgias, back pain and neck stiffness.  Skin: Negative for rash.  Neurological: Negative for dizziness, syncope, speech difficulty, weakness, light-headedness, numbness and headaches.  Hematological: Negative for adenopathy. Does not bruise/bleed easily.  Psychiatric/Behavioral: Negative for suicidal ideas, behavioral problems, self-injury, dysphoric mood and agitation. The patient is not nervous/anxious.        Objective:   Physical Exam  Constitutional: She is oriented to person, place, and time. She appears well-developed and well-nourished.  Elderly thin no acute distress. Weight 109. Blood pressure 130/80  HENT:  Head: Normocephalic and atraumatic.  Right Ear: External ear normal.  Left Ear: External ear normal.  Mouth/Throat: Oropharynx is clear and moist.  Eyes: Conjunctivae and EOM are normal.  Neck: Normal range of motion. Neck supple. No JVD present. No thyromegaly present.  Cardiovascular: Normal rate, regular rhythm and normal heart sounds.   No murmur heard. Faint posterior tibial  pulses only  Pulmonary/Chest: Effort normal. She has no wheezes. She has no rales.  Bibasilar rales the right greater than the left O2 saturation 95%  Abdominal: Soft. Bowel sounds are normal. She exhibits no distension and no mass. There is no tenderness. There is no rebound and no guarding.  Multiple abdominal surgical scar  Musculoskeletal: Normal range of motion. She  exhibits no edema and no tenderness.  Neurological: She is alert and oriented to person, place, and time. She has normal reflexes. No cranial nerve deficit. She exhibits normal muscle tone. Coordination normal.  Decreased vibratory sensation distally  Skin: Skin is warm and dry. No rash noted.  Scattered ecchymoses and senile skin changes  Psychiatric: She has a normal mood and affect. Her behavior is normal.          Assessment & Plan:    Preventive health examination COPD stable Vitamin B12 deficiency Osteoarthritis  We'll continue her present regimen Laboratory testing reviewed

## 2014-02-11 NOTE — Progress Notes (Signed)
Pre-visit discussion using our clinic review tool. No additional management support is needed unless otherwise documented below in the visit note.  

## 2014-02-11 NOTE — Patient Instructions (Signed)
Limit your sodium (Salt) intake  Return in 6 months for follow-up  

## 2014-02-13 ENCOUNTER — Telehealth: Payer: Self-pay | Admitting: Internal Medicine

## 2014-02-13 NOTE — Telephone Encounter (Signed)
Relevant patient education mailed to patient.  

## 2014-05-13 ENCOUNTER — Other Ambulatory Visit: Payer: Self-pay | Admitting: Internal Medicine

## 2014-05-18 ENCOUNTER — Encounter: Payer: Self-pay | Admitting: Internal Medicine

## 2014-05-18 ENCOUNTER — Ambulatory Visit (INDEPENDENT_AMBULATORY_CARE_PROVIDER_SITE_OTHER): Payer: Federal, State, Local not specified - PPO | Admitting: Internal Medicine

## 2014-05-18 VITALS — BP 120/68 | HR 69 | Temp 98.5°F | Resp 18 | Ht 65.5 in | Wt 99.0 lb

## 2014-05-18 DIAGNOSIS — M1990 Unspecified osteoarthritis, unspecified site: Secondary | ICD-10-CM

## 2014-05-18 DIAGNOSIS — M7072 Other bursitis of hip, left hip: Secondary | ICD-10-CM

## 2014-05-18 DIAGNOSIS — M810 Age-related osteoporosis without current pathological fracture: Secondary | ICD-10-CM

## 2014-05-18 DIAGNOSIS — M76899 Other specified enthesopathies of unspecified lower limb, excluding foot: Secondary | ICD-10-CM

## 2014-05-18 MED ORDER — TRAMADOL HCL 50 MG PO TABS: 50.0000 mg | ORAL_TABLET | Freq: Three times a day (TID) | ORAL | Status: DC | PRN

## 2014-05-18 MED ORDER — METHYLPREDNISOLONE ACETATE 80 MG/ML IJ SUSP
80.0000 mg | Freq: Once | INTRAMUSCULAR | Status: AC
  Administered 2014-05-18: 40 mg via INTRAMUSCULAR

## 2014-05-18 NOTE — Patient Instructions (Signed)
Call or return to clinic prn if these symptoms worsen or fail to improve as anticipated. Hip Bursitis Bursitis is a swelling and soreness (inflammation) of a fluid-filled sac (bursa). This sac overlies and protects the joints.  CAUSES   Injury.  Overuse of the muscles surrounding the joint.  Arthritis.  Gout.  Infection.  Cold weather.  Inadequate warm-up and conditioning prior to activities. The cause may not be known.  SYMPTOMS   Mild to severe irritation.  Tenderness and swelling over the outside of the hip.  Pain with motion of the hip.  If the bursa becomes infected, a fever may be present. Redness, tenderness, and warmth will develop over the hip. Symptoms usually lessen in 3 to 4 weeks with treatment, but can come back. TREATMENT If conservative treatment does not work, your caregiver may advise draining the bursa and injecting cortisone into the area. This may speed up the healing process. This may also be used as an initial treatment of choice. HOME CARE INSTRUCTIONS   Apply ice to the affected area for 15-20 minutes every 3 to 4 hours while awake for the first 2 days. Put the ice in a plastic bag and place a towel between the bag of ice and your skin.  Rest the painful joint as much as possible, but continue to put the joint through a normal range of motion at least 4 times per day. When the pain lessens, begin normal, slow movements and usual activities to help prevent stiffness of the hip.  Only take over-the-counter or prescription medicines for pain, discomfort, or fever as directed by your caregiver.  Use crutches to limit weight bearing on the hip joint, if advised.  Elevate your painful hip to reduce swelling. Use pillows for propping and cushioning your legs and hips.  Gentle massage may provide comfort and decrease swelling. SEEK IMMEDIATE MEDICAL CARE IF:   Your pain increases even during treatment, or you are not improving.  You have a  fever.  You have heat and inflammation over the involved bursa.  You have any other questions or concerns. MAKE SURE YOU:   Understand these instructions.  Will watch your condition.  Will get help right away if you are not doing well or get worse. Document Released: 05/26/2002 Document Revised: 02/26/2012 Document Reviewed: 12/23/2008 [facility] Patient Information 2014 [place], [place].

## 2014-05-18 NOTE — Progress Notes (Signed)
Subjective:    Patient ID: Sharon [name], female    DOB: January 14, 1923, [age] y.o.   MRN: [id number]  HPI  80 year old patient who has a history of osteoarthritis and osteoporosis.  She presents with a seven-day history of left hip pain.  There has been no trauma, falls, or unusual activities.  She is pain-free in a sitting position , but has pain with standing, walking, and also is uncomfortable at night when repositioning.  Pain is so significant in a supine position  Past Medical History  Diagnosis Date  . B12 DEFICIENCY 03/31/2008  . COPD 11/27/2008  . ESOPHAGEAL STRICTURE 01/29/2009  . MASS, LUNG 10/13/2009  . NASAL FRACTURE 08/12/2009  . OSTEOARTHRITIS 03/31/2008  . OSTEOPOROSIS 12/27/2007  . PEDAL EDEMA 12/27/2007  . PULMONARY NODULE 10/21/2009  . RESPIRATORY FAILURE, CHRONIC 06/29/2010    History   Social History  . Marital Status: Married    Spouse Name: N/A    Number of Children: N/A  . Years of Education: N/A   Occupational History  . Not on file.   Social History Main Topics  . Smoking status: Former Smoker    Quit date: 12/18/1978  . Smokeless tobacco: Never Used  . Alcohol Use: Yes  . Drug Use: No  . Sexual Activity: Not on file   Other Topics Concern  . Not on file   Social History Narrative  . No narrative on file    Past Surgical History  Procedure Laterality Date  . Appendectomy    . Cataract extraction    . Lumbar laminectomy    . Skin graf    . Rectal prolapse repair    . Esophagogastroduodenoscopy      with dilatation    No family history on file.  Allergies  Allergen Reactions  . Penicillins     REACTION: unspecified    Current Outpatient Prescriptions on File Prior to Visit  Medication Sig Dispense Refill  . ALPRAZolam (XANAX) 0.25 MG tablet TAKE 1 TABLET BY MOUTH THREE TIMES DAILY AS NEEDED.  60 tablet  2  . budesonide-formoterol (SYMBICORT) 160-4.5 MCG/ACT inhaler Inhale 2 puffs into the lungs 2 (two) times daily.  10.2 g  6  . Calcium  Carbonate-Vitamin D (CALTRATE 600+D) 600-400 MG-UNIT per chew tablet Chew 2 tablets by mouth daily.        . cyanocobalamin (,VITAMIN B-12,) 1000 MCG/ML injection INJECT 1ML EVERY MONTH AS DIRECTED  10 mL  0  . escitalopram (LEXAPRO) 5 MG tablet Take 1 tablet (5 mg total) by mouth daily.  90 tablet  3  . Multiple Vitamins-Minerals (PRESERVISION/LUTEIN) CAPS Take 1 capsule by mouth daily.       No current facility-administered medications on file prior to visit.    BP 120/68  Pulse 69  Temp(Src) 98.5 F (36.9 C) (Oral)  Resp 18  Ht 5' 5.5" (1.664 m)  Wt 99 lb (44.906 kg)  BMI 16.22 kg/m2  SpO2 95%       Review of Systems  Constitutional: Negative.   HENT: Negative for congestion, dental problem, hearing loss, rhinorrhea, sinus pressure, sore throat and tinnitus.   Eyes: Negative for pain, discharge and visual disturbance.  Respiratory: Negative for cough and shortness of breath.   Cardiovascular: Negative for chest pain, palpitations and leg swelling.  Gastrointestinal: Negative for nausea, vomiting, abdominal pain, diarrhea, constipation, blood in stool and abdominal distention.  Genitourinary: Negative for dysuria, urgency, frequency, hematuria, flank pain, vaginal bleeding, vaginal discharge, difficulty urinating, vaginal pain and  pelvic pain.  Musculoskeletal: Positive for arthralgias and gait problem. Negative for joint swelling.  Skin: Negative for rash.  Neurological: Negative for dizziness, syncope, speech difficulty, weakness, numbness and headaches.  Hematological: Negative for adenopathy.  Psychiatric/Behavioral: Negative for behavioral problems, dysphoric mood and agitation. The patient is not nervous/anxious.        Objective:   Physical Exam  Constitutional: She appears well-developed and well-nourished. No distress.  Ambulatory with a slight limp  Musculoskeletal:  Full range of motion, left hip without pain No tenderness over the greater trochanter           Assessment & Plan:   Probable left hip bursitis.  Will treated with Depo-Medrol 40.  Prescription for tramadol, dispensed. Pain has not responded to Tylenol and Aleve History of osteoarthritis

## 2014-05-18 NOTE — Progress Notes (Signed)
Pre-visit discussion using our clinic review tool. No additional management support is needed unless otherwise documented below in the visit note.  

## 2014-05-22 ENCOUNTER — Telehealth: Payer: Self-pay | Admitting: Internal Medicine

## 2014-05-22 MED ORDER — HYDROCODONE-ACETAMINOPHEN 5-325 MG PO TABS: 1.0000 | ORAL_TABLET | Freq: Three times a day (TID) | ORAL | Status: DC | PRN

## 2014-05-22 NOTE — Telephone Encounter (Signed)
Spoke to pt told her Dr.K is going to prescribe Hydrocodone for pain, one tablet every 8 hours prn. Told her someone needs to come pickup Rx. Pt verbalized understanding.

## 2014-05-22 NOTE — Telephone Encounter (Signed)
Okay to try Vicodin 5/325 one every 8 hours as needed for pain.  Okay for patient to pick up a prescription for #40

## 2014-05-22 NOTE — Telephone Encounter (Signed)
Caller: Kathleen/Other; Phone: [phone]; Reason for Call: States patient was seen in office on Mon 6/1 and received L hip steroid injection and script for Tramadol. Pt is reporting that the L hip pain has not been helped by either. Reports Tramadol has not been effective for pain and not sure what to do. Denies any new or worsening sxs for triage. Assured her will send message for MD review and someone will call back shortly with MD instructions/recommendations. Agreed to plan.

## 2014-05-22 NOTE — Telephone Encounter (Signed)
Please see message and advise 

## 2014-05-25 ENCOUNTER — Telehealth: Payer: Self-pay | Admitting: Internal Medicine

## 2014-05-25 DIAGNOSIS — M25552 Pain in left hip: Secondary | ICD-10-CM

## 2014-05-25 NOTE — Telephone Encounter (Signed)
Please advise 

## 2014-05-25 NOTE — Telephone Encounter (Signed)
Patient Information:  Caller Name: Nechy  Phone: [phone]  Patient: Sharon [name]  Gender: Female  DOB: 04/13/1923  Age: 80 Years  PCP: Eleonore Chiquito (Family Practice > 26yrs old)  Office Follow Up:  Does the office need to follow up with this patient?: Yes  Instructions For The Office: Please review, patient feels she is worse than 1 week ago at [facility].  Will be glad to come in again but appointment not available. Please contact patient at  [phone].   Symptoms  Reason For Call & Symptoms: Left hip pain started 2 weeks ago, seen in office Monday 6/1, Dx brusitis - Given steroid injection and Tramadol..  Hydrocodone called in did not help either. If sitting pain goes away but if getting up or walking pain will go to 8-9/10, is stabbing pain so has to sit back down..  Will wake if turns on side during night.  Feeling very tired, no energy.  Feels needs to be seen again.  Reviewed Health History In EMR: Yes  Reviewed Medications In EMR: Yes  Reviewed Allergies In EMR: Yes  Reviewed Surgeries / Procedures: Yes  Date of Onset of Symptoms: 05/11/2014  Treatments Tried: steroid adnd Tramadol , Hydrocodone called in  Treatments Tried Worked: No  Guideline(s) Used:  Leg Pain  Disposition Per Guideline:   Go to Office Now  Reason For Disposition Reached:   Severe pain (e.g., excruciating, unable to do any normal activities)  Advice Given:  N/A  Patient Will Follow Care Advice:  YES

## 2014-05-26 NOTE — Telephone Encounter (Signed)
Spoke to pt told her Dr. Kirtland Bouchard wants her to see Orthopedics. Told her I put order in for referral and someone will contact you for an appointment. Pt verbalized understanding.

## 2014-05-26 NOTE — Telephone Encounter (Signed)
Please schedule prompt orthopedic referral

## 2014-06-09 ENCOUNTER — Telehealth: Payer: Self-pay | Admitting: Internal Medicine

## 2014-06-09 NOTE — Telephone Encounter (Signed)
Pt was seen by dr. Farris HasKramer, per dr. Kirtland BouchardK, pt was put on prednisone and has completed the treatment, however pt still is in a lot of pain in lower back, pt states they informed her that there was no arthritis in the hip. Pt would like to know what her options is now and if dr. Kirtland Bouchardk need to refer her to see someone else.

## 2014-06-09 NOTE — Telephone Encounter (Signed)
Please call pt and schedule follow up visit per Dr. Kirtland BouchardK.

## 2014-06-09 NOTE — Telephone Encounter (Signed)
appt scheduled for pt.  

## 2014-06-09 NOTE — Telephone Encounter (Signed)
Schedule follow up office visit

## 2014-06-09 NOTE — Telephone Encounter (Signed)
Please advise 

## 2014-06-11 ENCOUNTER — Encounter: Payer: Self-pay | Admitting: Internal Medicine

## 2014-06-11 ENCOUNTER — Ambulatory Visit (INDEPENDENT_AMBULATORY_CARE_PROVIDER_SITE_OTHER): Payer: Federal, State, Local not specified - PPO | Admitting: Internal Medicine

## 2014-06-11 VITALS — BP 126/70 | HR 68 | Temp 98.2°F | Resp 18 | Ht 65.5 in | Wt 98.0 lb

## 2014-06-11 DIAGNOSIS — M1990 Unspecified osteoarthritis, unspecified site: Secondary | ICD-10-CM

## 2014-06-11 DIAGNOSIS — M545 Low back pain, unspecified: Secondary | ICD-10-CM

## 2014-06-11 NOTE — Patient Instructions (Signed)
Orthopedic followup as scheduled  Call or return to clinic prn if these symptoms worsen or fail to improve as anticipated.

## 2014-06-11 NOTE — Progress Notes (Signed)
Subjective:    Patient ID: Sharon [name], female    DOB: [birth year], 80 y.o.   MRN: [id number]  HPI 80 year old patient who is seen today in followup.  She was seen earlier with what appeared to be left hip pain.  After persistent symptoms.  She was referred to orthopedics.  Radiographs revealed the left hip to be normal.  She does have extensive of the spinal lumbar disease with fusion of the lumbar spine.  She has been referred to a spinal specialist.  She continues to have pain with movement, but is comfortable at rest  Past Medical History  Diagnosis Date  . B12 DEFICIENCY 03/31/2008  . COPD 11/27/2008  . ESOPHAGEAL STRICTURE 01/29/2009  . MASS, LUNG 10/13/2009  . NASAL FRACTURE 08/12/2009  . OSTEOARTHRITIS 03/31/2008  . OSTEOPOROSIS 12/27/2007  . PEDAL EDEMA 12/27/2007  . PULMONARY NODULE 10/21/2009  . RESPIRATORY FAILURE, CHRONIC 06/29/2010    History   Social History  . Marital Status: Married    Spouse Name: N/A    Number of Children: N/A  . Years of Education: N/A   Occupational History  . Not on file.   Social History Main Topics  . Smoking status: Former Smoker    Quit date: 12/18/1978  . Smokeless tobacco: Never Used  . Alcohol Use: Yes  . Drug Use: No  . Sexual Activity: Not on file   Other Topics Concern  . Not on file   Social History Narrative  . No narrative on file    Past Surgical History  Procedure Laterality Date  . Appendectomy    . Cataract extraction    . Lumbar laminectomy    . Skin graf    . Rectal prolapse repair    . Esophagogastroduodenoscopy      with dilatation    No family history on file.  Allergies  Allergen Reactions  . Penicillins     REACTION: unspecified    Current Outpatient Prescriptions on File Prior to Visit  Medication Sig Dispense Refill  . ALPRAZolam (XANAX) 0.25 MG tablet TAKE 1 TABLET BY MOUTH THREE TIMES DAILY AS NEEDED.  60 tablet  2  . budesonide-formoterol (SYMBICORT) 160-4.5 MCG/ACT inhaler Inhale 2 puffs  into the lungs 2 (two) times daily.  10.2 g  6  . Calcium Carbonate-Vitamin D (CALTRATE 600+D) 600-400 MG-UNIT per chew tablet Chew 2 tablets by mouth daily.        . cyanocobalamin (,VITAMIN B-12,) 1000 MCG/ML injection INJECT 1ML EVERY MONTH AS DIRECTED  10 mL  0  . escitalopram (LEXAPRO) 5 MG tablet Take 1 tablet (5 mg total) by mouth daily.  90 tablet  3  . HYDROcodone-acetaminophen (NORCO/VICODIN) 5-325 MG per tablet Take 1 tablet by mouth every 8 (eight) hours as needed for moderate pain.  40 tablet  0  . Multiple Vitamins-Minerals (PRESERVISION/LUTEIN) CAPS Take 1 capsule by mouth daily.      . traMADol (ULTRAM) 50 MG tablet Take 1 tablet (50 mg total) by mouth every 8 (eight) hours as needed.  60 tablet  0   No current facility-administered medications on file prior to visit.    BP 126/70  Pulse 68  Temp(Src) 98.2 F (36.8 C) (Oral)  Resp 18  Ht 5' 5.5" (1.664 m)  Wt 98 lb (44.453 kg)  BMI 16.05 kg/m2       Review of Systems  Musculoskeletal: Positive for back pain and gait problem.       Objective:   Physical Exam  Constitutional: She appears well-developed and well-nourished. No distress.  Comfortable sitting quietly at rest  Musculoskeletal:  Full range of motion, left hip Pain is now localized to the left lumbar and flank area          Assessment & Plan:   Lumbar disc disease.  The patient was given a prescription for oxycodone by orthopedics and referred to a spine specialist. Osteoarthritis

## 2014-06-11 NOTE — Progress Notes (Signed)
Pre-visit discussion using our clinic review tool. No additional management support is needed unless otherwise documented below in the visit note.  

## 2014-06-26 ENCOUNTER — Other Ambulatory Visit: Payer: Self-pay | Admitting: Internal Medicine

## 2014-08-10 ENCOUNTER — Other Ambulatory Visit: Payer: Self-pay | Admitting: Internal Medicine

## 2014-08-12 ENCOUNTER — Ambulatory Visit: Payer: Federal, State, Local not specified - PPO | Admitting: Internal Medicine

## 2014-08-14 ENCOUNTER — Ambulatory Visit (INDEPENDENT_AMBULATORY_CARE_PROVIDER_SITE_OTHER): Payer: Federal, State, Local not specified - PPO | Admitting: Internal Medicine

## 2014-08-14 ENCOUNTER — Encounter: Payer: Self-pay | Admitting: Internal Medicine

## 2014-08-14 VITALS — BP 155/72 | HR 75 | Temp 98.2°F | Resp 18 | Ht 65.5 in | Wt 96.0 lb

## 2014-08-14 DIAGNOSIS — M545 Low back pain, unspecified: Secondary | ICD-10-CM

## 2014-08-14 DIAGNOSIS — J4489 Other specified chronic obstructive pulmonary disease: Secondary | ICD-10-CM

## 2014-08-14 DIAGNOSIS — M1990 Unspecified osteoarthritis, unspecified site: Secondary | ICD-10-CM

## 2014-08-14 DIAGNOSIS — J449 Chronic obstructive pulmonary disease, unspecified: Secondary | ICD-10-CM

## 2014-08-14 MED ORDER — HYDROCODONE-ACETAMINOPHEN 5-325 MG PO TABS: 1.0000 | ORAL_TABLET | Freq: Three times a day (TID) | ORAL | Status: DC | PRN

## 2014-08-14 MED ORDER — TRAMADOL HCL 50 MG PO TABS: 50.0000 mg | ORAL_TABLET | Freq: Three times a day (TID) | ORAL | Status: DC | PRN

## 2014-08-14 MED ORDER — ALPRAZOLAM 0.25 MG PO TABS: ORAL_TABLET | ORAL | Status: DC

## 2014-08-14 MED ORDER — BUDESONIDE-FORMOTEROL FUMARATE 160-4.5 MCG/ACT IN AERO: INHALATION_SPRAY | RESPIRATORY_TRACT | Status: DC

## 2014-08-14 NOTE — Progress Notes (Signed)
Subjective:    Patient ID: Sharon [name], female    DOB: 02/01/1923, 80 y.o.   MRN: [id number]  HPI 80 year old patient who is seen today for her six-month followup.  She has a history of COPD and has been fairly stable.  She does describe some occasional nocturnal cough.  She has been under situational stress with the poor health of her elderly husband.  She is now receiving help from hospice, which has been very beneficial.  She is seen accompanied with her daughter today. No major concerns or complaints Pulmonary status stable  Past Medical History  Diagnosis Date  . B12 DEFICIENCY 03/31/2008  . COPD 11/27/2008  . ESOPHAGEAL STRICTURE 01/29/2009  . MASS, LUNG 10/13/2009  . NASAL FRACTURE 08/12/2009  . OSTEOARTHRITIS 03/31/2008  . OSTEOPOROSIS 12/27/2007  . PEDAL EDEMA 12/27/2007  . PULMONARY NODULE 10/21/2009  . RESPIRATORY FAILURE, CHRONIC 06/29/2010    History   Social History  . Marital Status: Married    Spouse Name: N/A    Number of Children: N/A  . Years of Education: N/A   Occupational History  . Not on file.   Social History Main Topics  . Smoking status: Former Smoker    Quit date: 12/18/1978  . Smokeless tobacco: Never Used  . Alcohol Use: Yes  . Drug Use: No  . Sexual Activity: Not on file   Other Topics Concern  . Not on file   Social History Narrative  . No narrative on file    Past Surgical History  Procedure Laterality Date  . Appendectomy    . Cataract extraction    . Lumbar laminectomy    . Skin graf    . Rectal prolapse repair    . Esophagogastroduodenoscopy      with dilatation    No family history on file.  Allergies  Allergen Reactions  . Penicillins     REACTION: unspecified    Current Outpatient Prescriptions on File Prior to Visit  Medication Sig Dispense Refill  . Calcium Carbonate-Vitamin D (CALTRATE 600+D) 600-400 MG-UNIT per chew tablet Chew 2 tablets by mouth daily.        . cyanocobalamin (,VITAMIN B-12,) 1000 MCG/ML  injection INJECT EVERY MONTH AS DIRECTED  10 mL  0  . escitalopram (LEXAPRO) 5 MG tablet Take 1 tablet (5 mg total) by mouth daily.  90 tablet  3  . Multiple Vitamins-Minerals (PRESERVISION/LUTEIN) CAPS Take 1 capsule by mouth daily.      . SYMBICORT 160-4.5 MCG/ACT inhaler INHALE 2 PUFFS BY MOUTH TWICE DAILY  10.2 g  5   No current facility-administered medications on file prior to visit.    BP 155/72  Pulse 75  Temp(Src) 98.2 F (36.8 C) (Oral)  Resp 18  Ht 5' 5.5" (1.664 m)  Wt 96 lb (43.545 kg)  BMI 15.73 kg/m2  SpO2 96%       Review of Systems  Constitutional: Positive for fatigue.  HENT: Negative for congestion, dental problem, hearing loss, rhinorrhea, sinus pressure, sore throat and tinnitus.   Eyes: Negative for pain, discharge and visual disturbance.  Respiratory: Negative for cough and shortness of breath.   Cardiovascular: Negative for chest pain, palpitations and leg swelling.  Gastrointestinal: Negative for nausea, vomiting, abdominal pain, diarrhea, constipation, blood in stool and abdominal distention.  Genitourinary: Negative for dysuria, urgency, frequency, hematuria, flank pain, vaginal bleeding, vaginal discharge, difficulty urinating, vaginal pain and pelvic pain.  Musculoskeletal: Negative for arthralgias, gait problem and joint swelling.  Skin:  Negative for rash.  Neurological: Negative for dizziness, syncope, speech difficulty, weakness, numbness and headaches.  Hematological: Negative for adenopathy.  Psychiatric/Behavioral: Positive for sleep disturbance. Negative for behavioral problems, dysphoric mood and agitation. The patient is nervous/anxious.        Objective:   Physical Exam  Constitutional: She is oriented to person, place, and time. She appears well-developed and well-nourished.  Wt Readings from Last 3 Encounters: 08/14/14 : 96 lb (43.545 kg) 06/11/14 : 98 lb (44.453 kg) 05/18/14 : 99 lb (44.906 kg)  Elderly, frail, no distress.   Blood pressure 140/70  HENT:  Head: Normocephalic.  Right Ear: External ear normal.  Left Ear: External ear normal.  Mouth/Throat: Oropharynx is clear and moist.  Eyes: Conjunctivae and EOM are normal. Pupils are equal, round, and reactive to light.  Neck: Normal range of motion. Neck supple. No thyromegaly present.  Cardiovascular: Normal rate, regular rhythm, normal heart sounds and intact distal pulses.   Pulmonary/Chest: Effort normal and breath sounds normal. No respiratory distress. She has no wheezes. She has no rales.  Abdominal: Soft. Bowel sounds are normal. She exhibits no mass. There is no tenderness.  Musculoskeletal: Normal range of motion.  Lymphadenopathy:    She has no cervical adenopathy.  Neurological: She is alert and oriented to person, place, and time.  Skin: Skin is warm and dry. No rash noted.  Psychiatric: She has a normal mood and affect. Her behavior is normal.          Assessment & Plan:   COPD stable.  Still has some nocturnal cough.  We'll consider a trial of nasal steroids Osteoporosis Osteoarthritis stable.  She has been seen in orthopedics recently due to 2 significant lumbar pain.  She has had 2 epidurals with the minimal benefit

## 2014-08-14 NOTE — Progress Notes (Signed)
Pre visit review using our clinic review tool, if applicable. No additional management support is needed unless otherwise documented below in the visit note. 

## 2014-08-14 NOTE — Patient Instructions (Signed)
Take a calcium supplement, plus [phone] units of vitamin D  Orthopedic followup  Return in 6 months for follow-up

## 2014-09-08 ENCOUNTER — Other Ambulatory Visit: Payer: Self-pay | Admitting: Internal Medicine

## 2014-10-21 ENCOUNTER — Encounter: Payer: Self-pay | Admitting: Internal Medicine

## 2014-10-21 ENCOUNTER — Ambulatory Visit (INDEPENDENT_AMBULATORY_CARE_PROVIDER_SITE_OTHER): Payer: Federal, State, Local not specified - PPO | Admitting: Internal Medicine

## 2014-10-21 VITALS — BP 110/60 | HR 72 | Temp 97.4°F | Resp 24 | Ht 65.5 in | Wt 99.0 lb

## 2014-10-21 DIAGNOSIS — J441 Chronic obstructive pulmonary disease with (acute) exacerbation: Secondary | ICD-10-CM

## 2014-10-21 MED ORDER — AZITHROMYCIN 250 MG PO TABS: ORAL_TABLET | ORAL | Status: DC

## 2014-10-21 NOTE — Progress Notes (Signed)
Subjective:    Patient ID: Sharon [name], female    DOB: 09-21-1923, 80 y.o.   MRN: [id number]  HPI  80 year old patient who has a history of COPD.  She is on chronic maintenance bronchodilators.  8 days ago, [facility].  Subsequently, she has had the onset of cough, chest congestion, worsening shortness of breath and fatigue.  There is been minimal productive cough.  Her left hip pain has improved, although she has a residual hematoma over the left sacroiliac region  Past Medical History  Diagnosis Date  . B12 DEFICIENCY 03/31/2008  . COPD 11/27/2008  . ESOPHAGEAL STRICTURE 01/29/2009  . MASS, LUNG 10/13/2009  . NASAL FRACTURE 08/12/2009  . OSTEOARTHRITIS 03/31/2008  . OSTEOPOROSIS 12/27/2007  . PEDAL EDEMA 12/27/2007  . PULMONARY NODULE 10/21/2009  . RESPIRATORY FAILURE, CHRONIC 06/29/2010    History   Social History  . Marital Status: Married    Spouse Name: N/A    Number of Children: N/A  . Years of Education: N/A   Occupational History  . Not on file.   Social History Main Topics  . Smoking status: Former Smoker    Quit date: 12/18/1978  . Smokeless tobacco: Never Used  . Alcohol Use: Yes  . Drug Use: No  . Sexual Activity: Not on file   Other Topics Concern  . Not on file   Social History Narrative    Past Surgical History  Procedure Laterality Date  . Appendectomy    . Cataract extraction    . Lumbar laminectomy    . Skin graf    . Rectal prolapse repair    . Esophagogastroduodenoscopy      with dilatation    No family history on file.  Allergies  Allergen Reactions  . Penicillins     REACTION: unspecified    Current Outpatient Prescriptions on File Prior to Visit  Medication Sig Dispense Refill  . ALPRAZolam (XANAX) 0.25 MG tablet TAKE 1 TABLET BY MOUTH THREE TIMES DAILY AS NEEDED 60 tablet 2  . budesonide-formoterol (SYMBICORT) 160-4.5 MCG/ACT inhaler INHALE 2 PUFFS BY MOUTH TWICE DAILY 10.2 g 5  . Calcium  Carbonate-Vitamin D (CALTRATE 600+D) 600-400 MG-UNIT per chew tablet Chew 2 tablets by mouth daily.      . cyanocobalamin (,VITAMIN B-12,) 1000 MCG/ML injection INJECT 1ML EVERY MONTH AS DIRECTED 10 mL 0  . escitalopram (LEXAPRO) 5 MG tablet TAKE 1 TABLET BY MOUTH DAILY 90 tablet 1  . Multiple Vitamins-Minerals (PRESERVISION/LUTEIN) CAPS Take 1 capsule by mouth daily.    Marland Kitchen. HYDROcodone-acetaminophen (NORCO/VICODIN) 5-325 MG per tablet Take 1 tablet by mouth every 8 (eight) hours as needed for moderate pain. 40 tablet 0  . traMADol (ULTRAM) 50 MG tablet Take 1 tablet (50 mg total) by mouth every 8 (eight) hours as needed. 60 tablet 0   No current facility-administered medications on file prior to visit.    BP 110/60 mmHg  Pulse 72  Temp(Src) 97.4 F (36.3 C) (Oral)  Resp 24  Ht 5' 5.5" (1.664 m)  Wt 99 lb (44.906 kg)  BMI 16.22 kg/m2  SpO2 90%     Review of Systems  Constitutional: Positive for activity change, appetite change and fatigue.  HENT: Negative for congestion, dental problem, hearing loss, rhinorrhea, sinus pressure, sore throat and tinnitus.   Eyes: Negative for pain, discharge and visual disturbance.  Respiratory: Positive for cough and shortness of breath.   Cardiovascular: Negative for chest pain, palpitations and leg  swelling.  Gastrointestinal: Negative for nausea, vomiting, abdominal pain, diarrhea, constipation, blood in stool and abdominal distention.  Genitourinary: Negative for dysuria, urgency, frequency, hematuria, flank pain, vaginal bleeding, vaginal discharge, difficulty urinating, vaginal pain and pelvic pain.  Musculoskeletal: Negative for joint swelling, arthralgias and gait problem.  Skin: Negative for rash.  Neurological: Positive for weakness. Negative for dizziness, syncope, speech difficulty, numbness and headaches.  Hematological: Negative for adenopathy.  Psychiatric/Behavioral: Negative for behavioral problems, dysphoric mood and agitation. The  patient is not nervous/anxious.        Objective:   Physical Exam  Constitutional: She is oriented to person, place, and time. She appears well-developed and well-nourished.  HENT:  Head: Normocephalic.  Right Ear: External ear normal.  Left Ear: External ear normal.  Mouth/Throat: Oropharynx is clear and moist.  Eyes: Conjunctivae and EOM are normal. Pupils are equal, round, and reactive to light.  Neck: Normal range of motion. Neck supple. No thyromegaly present.  Cardiovascular: Normal rate, regular rhythm, normal heart sounds and intact distal pulses.   Pulmonary/Chest: Effort normal and breath sounds normal.  Diminished breath sounds but clear No active wheezing O2 saturation 93% No tachycardia No increased work of breathing  Abdominal: Soft. Bowel sounds are normal. She exhibits no mass. There is no tenderness.  Musculoskeletal: Normal range of motion.  Lymphadenopathy:    She has no cervical adenopathy.  Neurological: She is alert and oriented to person, place, and time.  Skin: Skin is warm and dry. No rash noted.  Psychiatric: She has a normal mood and affect. Her behavior is normal.          Assessment & Plan:   COPD exacerbation.  We'll place on azithromycin expectorants; we'll report any clinical worsening.  Will continue maintenance bronchodilators. Osteoarthritis History of chronic respiratory failure

## 2014-10-21 NOTE — Patient Instructions (Signed)
Take over-the-counter expectorants and cough medications such as  Mucinex DM.  Call if there is no improvement in 5 to 7 days or if  you develop worsening cough, fever, or new symptoms, such as shortness of breath or chest pain.  Take your antibiotic as prescribed until ALL of it is gone, but stop if you develop a rash, swelling, or any side effects of the medication.  Contact our office as soon as possible if  there are side effects of the medication. 

## 2014-10-21 NOTE — Progress Notes (Signed)
Pre visit review using our clinic review tool, if applicable. No additional management support is needed unless otherwise documented below in the visit note. 

## 2014-11-11 ENCOUNTER — Other Ambulatory Visit: Payer: Self-pay | Admitting: Internal Medicine

## 2014-11-13 ENCOUNTER — Other Ambulatory Visit: Payer: Self-pay | Admitting: Internal Medicine

## 2015-01-13 ENCOUNTER — Telehealth: Payer: Self-pay | Admitting: Internal Medicine

## 2015-01-13 NOTE — Telephone Encounter (Signed)
Pt daughter called would like a call back about pt pain medication

## 2015-01-13 NOTE — Telephone Encounter (Signed)
Spoke to pt's son [name] had question about pain medication Hydrocodone wanted to know how often pt can take for back pain. Told him pt can take one every 8 hours as needed for pain. [name] verbalized understanding.

## 2015-02-08 ENCOUNTER — Other Ambulatory Visit (INDEPENDENT_AMBULATORY_CARE_PROVIDER_SITE_OTHER): Payer: Federal, State, Local not specified - PPO

## 2015-02-08 ENCOUNTER — Other Ambulatory Visit: Payer: Self-pay | Admitting: Dermatology

## 2015-02-08 DIAGNOSIS — Z0000 Encounter for general adult medical examination without abnormal findings: Secondary | ICD-10-CM

## 2015-02-08 LAB — POCT URINALYSIS DIPSTICK
Bilirubin, UA: NEGATIVE
Blood, UA: NEGATIVE
Glucose, UA: NEGATIVE
Ketones, UA: NEGATIVE
Nitrite, UA: NEGATIVE
Protein, UA: NEGATIVE
SPEC GRAV UA: 1.015
UROBILINOGEN UA: 0.2
pH, UA: 6

## 2015-02-08 LAB — CBC WITH DIFFERENTIAL/PLATELET
BASOS PCT: 0.6 % (ref 0.0–3.0)
Basophils Absolute: 0 10*3/uL (ref 0.0–0.1)
Eosinophils Absolute: 0.2 10*3/uL (ref 0.0–0.7)
Eosinophils Relative: 3.8 % (ref 0.0–5.0)
HEMATOCRIT: 40 % (ref 36.0–46.0)
Hemoglobin: 13.3 g/dL (ref 12.0–15.0)
LYMPHS ABS: 1.8 10*3/uL (ref 0.7–4.0)
Lymphocytes Relative: 27.9 % (ref 12.0–46.0)
MCHC: 33.2 g/dL (ref 30.0–36.0)
MCV: 94 fl (ref 78.0–100.0)
Monocytes Absolute: 0.7 10*3/uL (ref 0.1–1.0)
Monocytes Relative: 10.2 % (ref 3.0–12.0)
NEUTROS PCT: 57.5 % (ref 43.0–77.0)
Neutro Abs: 3.8 10*3/uL (ref 1.4–7.7)
Platelets: 332 10*3/uL (ref 150.0–400.0)
RBC: 4.26 Mil/uL (ref 3.87–5.11)
RDW: 14.2 % (ref 11.5–15.5)
WBC: 6.6 10*3/uL (ref 4.0–10.5)

## 2015-02-08 LAB — COMPREHENSIVE METABOLIC PANEL
ALK PHOS: 50 U/L (ref 39–117)
ALT: 16 U/L (ref 0–35)
AST: 17 U/L (ref 0–37)
Albumin: 3.9 g/dL (ref 3.5–5.2)
BUN: 18 mg/dL (ref 6–23)
CO2: 26 meq/L (ref 19–32)
Calcium: 9.4 mg/dL (ref 8.4–10.5)
Chloride: 99 mEq/L (ref 96–112)
Creatinine, Ser: 0.58 mg/dL (ref 0.40–1.20)
GFR: 103.85 mL/min (ref >60.00)
Glucose, Bld: 90 mg/dL (ref 70–99)
Potassium: 4 mEq/L (ref 3.5–5.1)
SODIUM: 133 meq/L — AB (ref 135–145)
Total Bilirubin: 0.6 mg/dL (ref 0.2–1.2)
Total Protein: 6.6 g/dL (ref 6.0–8.3)

## 2015-02-08 LAB — LIPID PANEL
Cholesterol: 186 mg/dL (ref 0–200)
HDL: 90.3 mg/dL (ref >39.00)
LDL Cholesterol: 85 mg/dL (ref 0–99)
NonHDL: 95.7
TRIGLYCERIDES: 53 mg/dL (ref 0.0–149.0)
Total CHOL/HDL Ratio: 2
VLDL: 10.6 mg/dL (ref 0.0–40.0)

## 2015-02-08 LAB — TSH: TSH: 2.63 u[IU]/mL (ref 0.35–4.50)

## 2015-02-15 ENCOUNTER — Encounter: Payer: Self-pay | Admitting: *Deleted

## 2015-02-15 ENCOUNTER — Other Ambulatory Visit: Payer: Self-pay | Admitting: Internal Medicine

## 2015-02-15 ENCOUNTER — Ambulatory Visit (INDEPENDENT_AMBULATORY_CARE_PROVIDER_SITE_OTHER): Payer: Federal, State, Local not specified - PPO | Admitting: Internal Medicine

## 2015-02-15 ENCOUNTER — Encounter: Payer: Self-pay | Admitting: Internal Medicine

## 2015-02-15 VITALS — BP 110/70 | HR 79 | Temp 97.8°F | Resp 18 | Ht 64.25 in | Wt 91.0 lb

## 2015-02-15 DIAGNOSIS — Z0000 Encounter for general adult medical examination without abnormal findings: Secondary | ICD-10-CM

## 2015-02-15 DIAGNOSIS — M150 Primary generalized (osteo)arthritis: Secondary | ICD-10-CM

## 2015-02-15 DIAGNOSIS — M159 Polyosteoarthritis, unspecified: Secondary | ICD-10-CM

## 2015-02-15 DIAGNOSIS — J441 Chronic obstructive pulmonary disease with (acute) exacerbation: Secondary | ICD-10-CM

## 2015-02-15 DIAGNOSIS — M810 Age-related osteoporosis without current pathological fracture: Secondary | ICD-10-CM

## 2015-02-15 DIAGNOSIS — K222 Esophageal obstruction: Secondary | ICD-10-CM

## 2015-02-15 MED ORDER — ALPRAZOLAM 0.25 MG PO TABS: 0.2500 mg | ORAL_TABLET | Freq: Three times a day (TID) | ORAL | Status: DC | PRN

## 2015-02-15 MED ORDER — TRAMADOL HCL 50 MG PO TABS: 50.0000 mg | ORAL_TABLET | Freq: Three times a day (TID) | ORAL | Status: DC | PRN

## 2015-02-15 MED ORDER — HYDROCODONE-ACETAMINOPHEN 5-325 MG PO TABS: 1.0000 | ORAL_TABLET | Freq: Three times a day (TID) | ORAL | Status: DC | PRN

## 2015-02-15 MED ORDER — OMEPRAZOLE 20 MG PO CPDR: 20.0000 mg | DELAYED_RELEASE_CAPSULE | Freq: Every day | ORAL | Status: DC

## 2015-02-15 MED ORDER — MIRTAZAPINE 15 MG PO TABS: 15.0000 mg | ORAL_TABLET | Freq: Every day | ORAL | Status: DC

## 2015-02-15 NOTE — Patient Instructions (Addendum)
Avoids foods high in acid such as tomatoes citrus juices, and spicy foods.  Avoid eating within two hours of lying down or before exercising.  Do not overheat.  Try smaller more frequent meals.  If symptoms persist, elevate the head of her bed 12 inches while sleeping.  Omeprazole 1 daily  GI follow-up as scheduled  Return here in 6 weeks or as needed  Health Maintenance Adopting a healthy lifestyle and getting preventive care can go a long way to promote health and wellness. Talk with your health care provider about what schedule of regular examinations is right for you. This is a good chance for you to check in with your provider about disease prevention and staying healthy. In between checkups, there are plenty of things you can do on your own. Experts have done a lot of research about which lifestyle changes and preventive measures are most likely to keep you healthy. Ask your health care provider for more information. WEIGHT AND DIET  Eat a healthy diet  Be sure to include plenty of vegetables, fruits, low-fat dairy products, and lean protein.  Do not eat a lot of foods high in solid fats, added sugars, or salt.  Get regular exercise. This is one of the most important things you can do for your health.  Most adults should exercise for at least 150 minutes each week. The exercise should increase your heart rate and make you sweat (moderate-intensity exercise).  Most adults should also do strengthening exercises at least twice a week. This is in addition to the moderate-intensity exercise.  Maintain a healthy weight  Body mass index (BMI) is a measurement that can be used to identify possible weight problems. It estimates body fat based on height and weight. Your health care provider can help determine your BMI and help you achieve or maintain a healthy weight.  For females 11 years of age and older:   A BMI below 18.5 is considered underweight.  A BMI of 18.5 to 24.9 is  normal.  A BMI of 25 to 29.9 is considered overweight.  A BMI of 30 and above is considered obese.  Watch levels of cholesterol and blood lipids  You should start having your blood tested for lipids and cholesterol at 79 years of age, then have this test every 5 years.  You may need to have your cholesterol levels checked more often if:  Your lipid or cholesterol levels are high.  You are older than 79 years of age.  You are at high risk for heart disease.  CANCER SCREENING   Lung Cancer  Lung cancer screening is recommended for adults 1-23 years old who are at high risk for lung cancer because of a history of smoking.  A yearly low-dose CT scan of the lungs is recommended for people who:  Currently smoke.  Have quit within the past 15 years.  Have at least a 30-pack-year history of smoking. A pack year is smoking an average of one pack of cigarettes a day for 1 year.  Yearly screening should continue until it has been 15 years since you quit.  Yearly screening should stop if you develop a health problem that would prevent you from having lung cancer treatment.  Breast Cancer  Practice breast self-awareness. This means understanding how your breasts normally appear and feel.  It also means doing regular breast self-exams. Let your health care provider know about any changes, no matter how small.  If you are in your 51s or  41s, you should have a clinical breast exam (CBE) by a health care provider every 1-3 years as part of a regular health exam.  If you are 77 or older, have a CBE every year. Also consider having a breast X-ray (mammogram) every year.  If you have a family history of breast cancer, talk to your health care provider about genetic screening.  If you are at high risk for breast cancer, talk to your health care provider about having an MRI and a mammogram every year.  Breast cancer gene (BRCA) assessment is recommended for women who have family members  with BRCA-related cancers. BRCA-related cancers include:  Breast.  Ovarian.  Tubal.  Peritoneal cancers.  Results of the assessment will determine the need for genetic counseling and BRCA1 and BRCA2 testing. Cervical Cancer Routine pelvic examinations to screen for cervical cancer are no longer recommended for nonpregnant women who are considered low risk for cancer of the pelvic organs (ovaries, uterus, and vagina) and who do not have symptoms. A pelvic examination may be necessary if you have symptoms including those associated with pelvic infections. Ask your health care provider if a screening pelvic exam is right for you.   The Pap test is the screening test for cervical cancer for women who are considered at risk.  If you had a hysterectomy for a problem that was not cancer or a condition that could lead to cancer, then you no longer need Pap tests.  If you are older than 65 years, and you have had normal Pap tests for the past 10 years, you no longer need to have Pap tests.  If you have had past treatment for cervical cancer or a condition that could lead to cancer, you need Pap tests and screening for cancer for at least 20 years after your treatment.  If you no longer get a Pap test, assess your risk factors if they change (such as having a new sexual partner). This can affect whether you should start being screened again.  Some women have medical problems that increase their chance of getting cervical cancer. If this is the case for you, your health care provider may recommend more frequent screening and Pap tests.  The human papillomavirus (HPV) test is another test that may be used for cervical cancer screening. The HPV test looks for the virus that can cause cell changes in the cervix. The cells collected during the Pap test can be tested for HPV.  The HPV test can be used to screen women 78 years of age and older. Getting tested for HPV can extend the interval between normal  Pap tests from three to five years.  An HPV test also should be used to screen women of any age who have unclear Pap test results.  After 79 years of age, women should have HPV testing as often as Pap tests.  Colorectal Cancer  This type of cancer can be detected and often prevented.  Routine colorectal cancer screening usually begins at 79 years of age and continues through 79 years of age.  Your health care provider may recommend screening at an earlier age if you have risk factors for colon cancer.  Your health care provider may also recommend using home test kits to check for hidden blood in the stool.  A small camera at the end of a tube can be used to examine your colon directly (sigmoidoscopy or colonoscopy). This is done to check for the earliest forms of colorectal cancer.  Routine screening usually begins at age 50.  Direct examination of the colon should be repeated every 5-10 years through 79 years of age. However, you may need to be screened more often if early forms of precancerous polyps or small growths are found. Skin Cancer  Check your skin from head to toe regularly.  Tell your health care provider about any new moles or changes in moles, especially if there is a change in a mole's shape or color.  Also tell your health care provider if you have a mole that is larger than the size of a pencil eraser.  Always use sunscreen. Apply sunscreen liberally and repeatedly throughout the day.  Protect yourself by wearing long sleeves, pants, a wide-brimmed hat, and sunglasses whenever you are outside. HEART DISEASE, DIABETES, AND HIGH BLOOD PRESSURE   Have your blood pressure checked at least every 1-2 years. High blood pressure causes heart disease and increases the risk of stroke.  If you are between 55 years and 79 years old, ask your health care provider if you should take aspirin to prevent strokes.  Have regular diabetes screenings. This involves taking a blood  sample to check your fasting blood sugar level.  If you are at a normal weight and have a low risk for diabetes, have this test once every three years after 79 years of age.  If you are overweight and have a high risk for diabetes, consider being tested at a younger age or more often. PREVENTING INFECTION  Hepatitis B  If you have a higher risk for hepatitis B, you should be screened for this virus. You are considered at high risk for hepatitis B if:  You were born in a country where hepatitis B is common. Ask your health care provider which countries are considered high risk.  Your parents were born in a high-risk country, and you have not been immunized against hepatitis B (hepatitis B vaccine).  You have HIV or AIDS.  You use needles to inject street drugs.  You live with someone who has hepatitis B.  You have had sex with someone who has hepatitis B.  You get hemodialysis treatment.  You take certain medicines for conditions, including cancer, organ transplantation, and autoimmune conditions. Hepatitis C  Blood testing is recommended for:  Everyone born from 1945 through 1965.  Anyone with known risk factors for hepatitis C. Sexually transmitted infections (STIs)  You should be screened for sexually transmitted infections (STIs) including gonorrhea and chlamydia if:  You are sexually active and are younger than 79 years of age.  You are older than 79 years of age and your health care provider tells you that you are at risk for this type of infection.  Your sexual activity has changed since you were last screened and you are at an increased risk for chlamydia or gonorrhea. Ask your health care provider if you are at risk.  If you do not have HIV, but are at risk, it may be recommended that you take a prescription medicine daily to prevent HIV infection. This is called pre-exposure prophylaxis (PrEP). You are considered at risk if:  You are sexually active and do not  regularly use condoms or know the HIV status of your partner(s).  You take drugs by injection.  You are sexually active with a partner who has HIV. Talk with your health care provider about whether you are at high risk of being infected with HIV. If you choose to begin PrEP, you should first   be tested for HIV. You should then be tested every 3 months for as long as you are taking PrEP.  PREGNANCY   If you are premenopausal and you may become pregnant, ask your health care provider about preconception counseling.  If you may become pregnant, take 400 to 800 micrograms (mcg) of folic acid every day.  If you want to prevent pregnancy, talk to your health care provider about birth control (contraception). OSTEOPOROSIS AND MENOPAUSE   Osteoporosis is a disease in which the bones lose minerals and strength with aging. This can result in serious bone fractures. Your risk for osteoporosis can be identified using a bone density scan.  If you are 65 years of age or older, or if you are at risk for osteoporosis and fractures, ask your health care provider if you should be screened.  Ask your health care provider whether you should take a calcium or vitamin D supplement to lower your risk for osteoporosis.  Menopause may have certain physical symptoms and risks.  Hormone replacement therapy may reduce some of these symptoms and risks. Talk to your health care provider about whether hormone replacement therapy is right for you.  HOME CARE INSTRUCTIONS   Schedule regular health, dental, and eye exams.  Stay current with your immunizations.   Do not use any tobacco products including cigarettes, chewing tobacco, or electronic cigarettes.  If you are pregnant, do not drink alcohol.  If you are breastfeeding, limit how much and how often you drink alcohol.  Limit alcohol intake to no more than 1 drink per day for nonpregnant women. One drink equals 12 ounces of beer, 5 ounces of wine, or 1  ounces of hard liquor.  Do not use street drugs.  Do not share needles.  Ask your health care provider for help if you need support or information about quitting drugs.  Tell your health care provider if you often feel depressed.  Tell your health care provider if you have ever been abused or do not feel safe at home. Document Released: 06/19/2011 Document Revised: 04/20/2014 Document Reviewed: 11/05/2013 [employer] Patient Information 2015 [employer]. This information is not intended to replace advice given to you by your health care provider. Make sure you discuss any questions you have with your health care provider.  

## 2015-02-15 NOTE — Progress Notes (Signed)
Subjective:    Patient ID: Sharon [name], female    DOB: [birth year], 81 y.o.   MRN: [id number]  HPI  Wt Readings from Last 3 Encounters:  02/15/15 91 lb (41.277 kg)  10/21/14 99 lb (44.906 kg)  08/14/14 96 lb (43.545 kg)    Patient ID: Sharon [name], female   DOB: [birth year], 81 y.o.   MRN: [id number]  Subjective:    Patient ID: Sharon [name], female    DOB: 12-Oct-1923, 81 y.o.   MRN: [id number]  HPI    History of Present Illness:   81    year-old patient who is seen today for a wellness exam.  She is followed closely by pulmonary medicine. She was hospitalized in June 2011 for treatment of a hip fracture. She has a history of chronic respiratory insufficiency, COPD, and B12 deficiency. She has treated osteoporosis.   She is adjusting to the recent death of her husband.  Complaints include some worsening dysphagia.  She does have a history of esophageal stricture that has required dilatations in the past.  This was last required about 6 years ago. There is been some anxiety and anorexia with weight loss   Here for Medicare AWV:   1. Risk factors based on Past M, S, F history: cardiovascular risk factors include age, and history of tobacco use  2. Physical Activities: limited due to gait instability, status post recent hip fracture  3. Depression/mood: no history of major depression or mood disorder , anxiety disorder.  Recent reactive depression due to the recent death of her spouse 4. Hearing: no significant impairment  5. ADL's: independent in aspects of daily living  6. Fall Risk: moderate to high due to gait instability and weakness following recent hip surgery  7. Home Safety: no problems  Identified  8. Height, weight, &visual acuity:height and weight stable. No change in visual acuity  9. Counseling: will continue calcium and vitamin D supplementation along with biphosphonate therapy  10. Labs ordered based on risk factors: TSH will be reviewed; laboratory studies were  performed recently in the hospital  11. Referral Coordination- continued pulmonary follow-up  12. Care Plan- will increase her activity level. She does have some mild constipation issues. Will increase fluids. Activity and a stool softener added to her regimen  13. Cognitive Assessment- alert and oriented, with normal affect; no difficulty with short-term memory; handles all executive functioning without difficulty  14.  Preventive services will include annual health examinations with screening lab. Patient was provided with a written and personalized care plan 15.  Provider list includes primary care medicine and pulmonary medicine  Allergies:  1) Penicillin G Potassium (Penicillin G Potassium)   Past History:  Past Medical History:   Osteoporosis  B12 deficiency  Osteoarthritis  COPD  - PFT's December 03, 2009 FEV1 67 (40%) with 12% response to B2 and DLC0 55%  - HFA 25% June 29, 2010 > 75% August 11, 2010  Fx Right Hip and required ORIF June 19th 2011> 02 dependent since  history of esophageal stricture...........................................Marland KitchenPatterson  Multiple pulmonary nodules..................................................Marland KitchenWert  -CT chest 10/13/09 assoc with bilateral lower lobe bronchiectasis  -CXR December 03, 2009 no change > no change June 29, 2010   Past Surgical History:   breast biopsy 1947  Appendectomy 1938  Cataract extraction 1986, and 2003  Lumbar laminectomy 2005  skin graft, 1998  surgery for a rectal prolapse 2006  colonoscopy December 2005  laparoscopic cholecystectomy in January 2010  status post prior esophageal dilatation s/p  dilatation 3  Septal reconstruction 2010   Family History:   father died at age 26, complications of COPD, and EtOH  mother died age 68, gastric cancer  One sister deceased, unclear causes; history of EtOH; died age 79  No FH of Colon Cancer:   Social History:    Married, 4 boys, 2 girls Husband is quite ill with a  heart failure as well as dementia - does have help at home from a daughter A grandson will be retiring this spring 2015 from medical school.  To pursue a internal medicine residency.  PhD as a Forensic scientist Retired  Patient is a former smoker. Quit in early 1980's, 2 ppd for 20 years  Alcohol Use - yes 2 drinks/day  Daily Caffeine Use 2/day  Illicit Drug Use - no   Review of Systems  Constitutional: Negative for fever, appetite change, fatigue and unexpected weight change.  HENT: Negative for congestion, dental problem, ear pain, hearing loss, mouth sores, nosebleeds, sinus pressure, sore throat, tinnitus, trouble swallowing and voice change.   Eyes: Negative for photophobia, pain, redness and visual disturbance.  Respiratory: Negative for cough, chest tightness and shortness of breath.   Cardiovascular: Negative for chest pain, palpitations and leg swelling.  Gastrointestinal: Negative for nausea, vomiting, abdominal pain, diarrhea, constipation, blood in stool, abdominal distention and rectal pain.  Genitourinary: Negative for dysuria, urgency, frequency, hematuria, flank pain, vaginal bleeding, vaginal discharge, difficulty urinating, genital sores, vaginal pain, menstrual problem and pelvic pain.  Musculoskeletal: Negative for arthralgias, back pain and neck stiffness.  Skin: Negative for rash.  Neurological: Negative for dizziness, syncope, speech difficulty, weakness, light-headedness, numbness and headaches.  Hematological: Negative for adenopathy. Does not bruise/bleed easily.  Psychiatric/Behavioral: Negative for suicidal ideas, behavioral problems, self-injury, dysphoric mood and agitation. The patient is not nervous/anxious.        Objective:   Physical Exam  Constitutional: She is oriented to person, place, and time. She appears well-developed and well-nourished.  Elderly thin no acute distress. Weight 109. Blood pressure 130/80  HENT:  Head: Normocephalic and atraumatic.   Right Ear: External ear normal.  Left Ear: External ear normal.  Mouth/Throat: Oropharynx is clear and moist.  Eyes: Conjunctivae and EOM are normal.  Neck: Normal range of motion. Neck supple. No JVD present. No thyromegaly present.  Cardiovascular: Normal rate, regular rhythm and normal heart sounds.   No murmur heard. Faint posterior tibial  pulses only  Pulmonary/Chest: Effort normal. She has no wheezes. She has no rales.  Bibasilar rales the right greater than the left O2 saturation 95%  Abdominal: Soft. Bowel sounds are normal. She exhibits no distension and no mass. There is no tenderness. There is no rebound and no guarding.  Multiple abdominal surgical scar  Musculoskeletal: Normal range of motion. She exhibits no edema and no tenderness.  Neurological: She is alert and oriented to person, place, and time. She has normal reflexes. No cranial nerve deficit. She exhibits normal muscle tone. Coordination normal.  Decreased vibratory sensation distally  Skin: Skin is warm and dry. No rash noted.  Scattered ecchymoses and senile skin changes  Psychiatric: She has a normal mood and affect. Her behavior is normal.          Assessment & Plan:    Preventive health examination COPD stable Vitamin B12 deficiency Osteoarthritis  We'll continue her present regimen Laboratory testing reviewed   Review of Systems  Constitutional: Positive for appetite change and unexpected weight change.  HENT: Negative for congestion,  dental problem, hearing loss, rhinorrhea, sinus pressure, sore throat and tinnitus.   Eyes: Negative for pain, discharge and visual disturbance.  Respiratory: Negative for cough and shortness of breath.   Cardiovascular: Negative for chest pain, palpitations and leg swelling.  Gastrointestinal: Negative for nausea, vomiting, abdominal pain, diarrhea, constipation, blood in stool and abdominal distention.       Solid food dysphagia  Genitourinary: Negative for  dysuria, urgency, frequency, hematuria, flank pain, vaginal bleeding, vaginal discharge, difficulty urinating, vaginal pain and pelvic pain.  Musculoskeletal: Negative for joint swelling, arthralgias and gait problem.  Skin: Negative for rash.  Neurological: Negative for dizziness, syncope, speech difficulty, weakness, numbness and headaches.  Hematological: Negative for adenopathy.  Psychiatric/Behavioral: Negative for behavioral problems, dysphoric mood and agitation. The patient is nervous/anxious.        Objective:   Physical Exam  Musculoskeletal: She exhibits edema.  Right ankle edema          Assessment & Plan:  Preventive health examination Worsening solid food dysphagia.  Will set up for GI consultation.  Place on chronic PPI therapy Weight loss, anorexia. COPD stable  GI referral Lab reviewed Recheck 4 weeks

## 2015-02-23 ENCOUNTER — Ambulatory Visit: Payer: Federal, State, Local not specified - PPO | Admitting: Nurse Practitioner

## 2015-02-24 ENCOUNTER — Ambulatory Visit (INDEPENDENT_AMBULATORY_CARE_PROVIDER_SITE_OTHER): Payer: Federal, State, Local not specified - PPO | Admitting: Nurse Practitioner

## 2015-02-24 ENCOUNTER — Encounter: Payer: Self-pay | Admitting: Nurse Practitioner

## 2015-02-24 ENCOUNTER — Encounter (HOSPITAL_COMMUNITY): Payer: Self-pay | Admitting: *Deleted

## 2015-02-24 VITALS — BP 142/84 | HR 82 | Ht 65.0 in | Wt 93.6 lb

## 2015-02-24 DIAGNOSIS — R1310 Dysphagia, unspecified: Secondary | ICD-10-CM

## 2015-02-24 NOTE — Progress Notes (Signed)
HPI :   Known remotely to Dr. Arlyce DiceKaplan for history of esophageal strictures requiring sequential dilations. Her last EGD with  was March 2010 at which time a stricture was seen at the GE junction and balloon dilation was performed. She did well until a year or so ago when dysphagia recurred. Meat is problematic but pills cause the most problems. Patient going on vacation to [place] next week and so is concerned about dining out and food getting stuck. No pain with swallowing. No other GI complaints. She has had some unexplained weight loss recently. Patient denies history of GERD. She was prescribed daily PPI but takes it only a couple of times a week.  Past Medical History  Diagnosis Date  . B12 DEFICIENCY 03/31/2008  . COPD 11/27/2008  . ESOPHAGEAL STRICTURE 01/29/2009  . MASS, LUNG 10/13/2009  . NASAL FRACTURE 08/12/2009  . OSTEOARTHRITIS 03/31/2008  . OSTEOPOROSIS 12/27/2007  . PEDAL EDEMA 12/27/2007  . PULMONARY NODULE 10/21/2009  . RESPIRATORY FAILURE, CHRONIC 06/29/2010   Past Surgical History  Procedure Laterality Date  . Cataract extraction Bilateral     last '05  . Lumbar laminectomy    . Skin graf      '98  . Rectal prolapse repair      '06  . Esophagogastroduodenoscopy      with dilatation x3  . Appendectomy      '38  . Breast surgery      biopsy '47  . Cholecystectomy      laparoscopic'10  . Nasal reconstruction with septal repair      '10    [facility]: COPD, gastric cancer.   Current Outpatient Prescriptions  Medication Sig Dispense Refill  . ALPRAZolam (XANAX) 0.25 MG tablet Take 1 tablet (0.25 mg total) by mouth 3 (three) times daily as needed. 60 tablet 5  . budesonide-formoterol (SYMBICORT) 160-4.5 MCG/ACT inhaler INHALE 2 PUFFS BY MOUTH TWICE DAILY 10.2 g 5  . Calcium Carbonate-Vitamin D (CALTRATE 600+D) 600-400 MG-UNIT per chew tablet Chew 2 tablets by mouth daily.      . cyanocobalamin (,VITAMIN B-12,) 1000 MCG/ML injection INJECT 1 ML IN THE MUSCLE ONCE MONTHLY  10 mL 0  . escitalopram (LEXAPRO) 5 MG tablet TAKE 1 TABLET BY MOUTH DAILY 90 tablet 1  . HYDROcodone-acetaminophen (NORCO/VICODIN) 5-325 MG per tablet Take 1 tablet by mouth every 8 (eight) hours as needed for moderate pain. 40 tablet 0  . mirtazapine (REMERON) 15 MG tablet Take 1 tablet (15 mg total) by mouth at bedtime. 90 tablet 2  . Multiple Vitamins-Minerals (PRESERVISION/LUTEIN) CAPS Take 1 capsule by mouth daily.    Marland Kitchen. omeprazole (PRILOSEC) 20 MG capsule Take 1 capsule (20 mg total) by mouth daily. 30 capsule 3  . traMADol (ULTRAM) 50 MG tablet Take 1 tablet (50 mg total) by mouth every 8 (eight) hours as needed. 60 tablet 2   No current facility-administered medications for this visit.   Allergies  Allergen Reactions  . Penicillins     REACTION: unspecified     Review of Systems: All systems reviewed and negative except where noted in HPI.    Physical Exam: BP 142/84 mmHg  Pulse 82  Ht 5\' 5"  (1.651 m)  Wt 93 lb 9.6 oz (42.457 kg)  BMI 15.58 kg/m2 Constitutional: Pleasant, thin white female in no acute distress. HEENT: Normocephalic and atraumatic. Conjunctivae are normal. No scleral icterus. Neck supple.  Cardiovascular: Normal rate, regular rhythm.  Pulmonary/chest: Effort normal and breath sounds normal. No wheezing, rales  or rhonchi. Abdominal: Soft, nondistended, nontender. Bowel sounds active throughout. There are no masses palpable. No hepatomegaly. Extremities: no edema Lymphadenopathy: No cervical adenopathy noted. Neurological: Alert and oriented to person place and time. Skin: Skin is warm and dry. No rashes noted. Psychiatric: Normal mood and affect. Behavior is normal.   ASSESSMENT AND PLAN:  81 year old female with recurrent solid food dysphagia and history of esophageal strictures requiring sequential dilations, last one done in 2010. For further evaluation and treatment patient will be scheduled for EGD with balloon dilation . Procedure will be done at  the hospital. The benefits, risks, and potential complications of EGD with possible biopsies and/or dilation were discussed with the patient and she agrees to proceed.

## 2015-02-24 NOTE — Patient Instructions (Signed)

## 2015-02-25 ENCOUNTER — Telehealth: Payer: Self-pay | Admitting: Internal Medicine

## 2015-02-25 DIAGNOSIS — R1310 Dysphagia, unspecified: Secondary | ICD-10-CM | POA: Insufficient documentation

## 2015-02-25 MED ORDER — ALPRAZOLAM 0.25 MG PO TABS: 0.2500 mg | ORAL_TABLET | Freq: Three times a day (TID) | ORAL | Status: DC | PRN

## 2015-02-25 NOTE — Telephone Encounter (Signed)
Pt's daughter-in-[facility]ill request of ALPRAZolam (XANAX) 0.25 MG tablet.  She also has a question related to pt's traMADol (ULTRAM) 50 MG tablet refill.

## 2015-02-25 NOTE — Progress Notes (Signed)
Reviewed and agree with management. Karlisha Mathena D. Abrish Erny, M.D., FACG  

## 2015-02-25 NOTE — Telephone Encounter (Signed)
Spoke to pt, needs a 30 day supply to hold her over while on trip. Told her okay will call in Rx to the pharmacy. Pt verbalized understanding.

## 2015-02-26 ENCOUNTER — Other Ambulatory Visit: Payer: Self-pay | Admitting: Internal Medicine

## 2015-03-02 ENCOUNTER — Telehealth: Payer: Self-pay

## 2015-03-02 ENCOUNTER — Encounter (HOSPITAL_COMMUNITY): Payer: Self-pay | Admitting: Anesthesiology

## 2015-03-02 NOTE — Telephone Encounter (Signed)
Please apologize on my behalf that we had to cancel the procedure today.  Let's schedule her for a date when she returns.  This can be done either at the hospital or [facility]

## 2015-03-02 NOTE — Telephone Encounter (Signed)
[facility] today for her EGD with dil that had been cancelled and moved to Thursday. Pt states they cannot make the appt Thursday. Pt will be going out of town for 3 weeks. Does she just need to be rescheduled when she gets back in town? Please advise.

## 2015-03-03 NOTE — Telephone Encounter (Signed)
No answer-phone goes to fax [employer]-no voicemail

## 2015-03-04 SURGERY — ESOPHAGOGASTRODUODENOSCOPY (EGD) WITH PROPOFOL
Anesthesia: Monitor Anesthesia Care

## 2015-03-05 ENCOUNTER — Ambulatory Visit (HOSPITAL_COMMUNITY)
Admission: RE | Admit: 2015-03-05 | Payer: Federal, State, Local not specified - PPO | Source: Ambulatory Visit | Admitting: Gastroenterology

## 2015-04-06 ENCOUNTER — Other Ambulatory Visit: Payer: Self-pay

## 2015-04-06 NOTE — Telephone Encounter (Signed)
Letter mailed to the patient

## 2015-04-12 ENCOUNTER — Other Ambulatory Visit: Payer: Self-pay

## 2015-04-12 ENCOUNTER — Telehealth: Payer: Self-pay | Admitting: Gastroenterology

## 2015-04-12 NOTE — Telephone Encounter (Signed)
She is ready to reschedule her colonoscopy. She cannot do this on a Tuesday or a Thursday. Will try for 05/10/15 at 12:30 if the Endo unit can accommodate her. She expects to hear from me tomorrow.

## 2015-04-13 ENCOUNTER — Other Ambulatory Visit: Payer: Self-pay

## 2015-04-13 DIAGNOSIS — R1314 Dysphagia, pharyngoesophageal phase: Secondary | ICD-10-CM

## 2015-04-13 NOTE — Telephone Encounter (Signed)
Information given to Mrs. Sharon [name] for the patient. Scheduled for 05/10/15 [facility].

## 2015-04-16 ENCOUNTER — Ambulatory Visit: Payer: Federal, State, Local not specified - PPO | Admitting: Internal Medicine

## 2015-04-19 ENCOUNTER — Encounter: Payer: Self-pay | Admitting: Internal Medicine

## 2015-04-19 ENCOUNTER — Ambulatory Visit (INDEPENDENT_AMBULATORY_CARE_PROVIDER_SITE_OTHER): Payer: Federal, State, Local not specified - PPO | Admitting: Internal Medicine

## 2015-04-19 VITALS — BP 108/70 | HR 68 | Temp 98.0°F | Resp 18 | Ht 65.0 in | Wt 94.0 lb

## 2015-04-19 DIAGNOSIS — R1310 Dysphagia, unspecified: Secondary | ICD-10-CM

## 2015-04-19 DIAGNOSIS — F329 Major depressive disorder, single episode, unspecified: Secondary | ICD-10-CM | POA: Diagnosis not present

## 2015-04-19 DIAGNOSIS — F32A Depression, unspecified: Secondary | ICD-10-CM

## 2015-04-19 NOTE — Progress Notes (Signed)
Subjective:    Patient ID: Sharon [name], female    DOB: 04-23-1923, 81 y.o.   MRN: [id number]  HPI 81 year old patient who is seen today in follow-up.  She is scheduled to see GI later this month for evaluation of dysphagia.  Her weight has been fairly stable, although malnourished. Her depression has improved.  Wt Readings from Last 3 Encounters:  04/19/15 94 lb (42.638 kg)  02/24/15 93 lb 9.6 oz (42.457 kg)  02/15/15 91 lb (41.277 kg)    Past Medical History  Diagnosis Date  . B12 DEFICIENCY 03/31/2008  . COPD 11/27/2008  . ESOPHAGEAL STRICTURE 01/29/2009  . MASS, LUNG 10/13/2009  . NASAL FRACTURE 08/12/2009  . OSTEOARTHRITIS 03/31/2008  . OSTEOPOROSIS 12/27/2007  . PEDAL EDEMA 12/27/2007  . PULMONARY NODULE 10/21/2009  . RESPIRATORY FAILURE, CHRONIC 06/29/2010    History   Social History  . Marital Status: Widowed    Spouse Name: N/A  . Number of Children: N/A  . Years of Education: N/A   Occupational History  . Not on file.   Social History Main Topics  . Smoking status: Former Smoker    Quit date: 12/18/1978  . Smokeless tobacco: Never Used  . Alcohol Use: 0.0 oz/week    0 Standard drinks or equivalent per week     Comment: couple of gin and tonics daily  . Drug Use: No  . Sexual Activity: Not on file   Other Topics Concern  . Not on file   Social History Narrative    Past Surgical History  Procedure Laterality Date  . Cataract extraction Bilateral     last '05  . Lumbar laminectomy    . Skin graf      '98  . Rectal prolapse repair      '06  . Esophagogastroduodenoscopy      with dilatation x3  . Appendectomy      '38  . Breast surgery      biopsy '47  . Cholecystectomy      laparoscopic'10  . Nasal reconstruction with septal repair      '10    No family history on file.  Allergies  Allergen Reactions  . Penicillins     REACTION: unspecified    Current Outpatient Prescriptions on File Prior to Visit  Medication Sig Dispense Refill  .  ALPRAZolam (XANAX) 0.25 MG tablet Take 1 tablet (0.25 mg total) by mouth 3 (three) times daily as needed. 90 tablet 0  . Calcium Carbonate-Vitamin D (CALTRATE 600+D) 600-400 MG-UNIT per chew tablet Chew 2 tablets by mouth daily.      . cyanocobalamin (,VITAMIN B-12,) 1000 MCG/ML injection INJECT 1 ML IN THE MUSCLE ONCE MONTHLY 10 mL 0  . escitalopram (LEXAPRO) 5 MG tablet TAKE 1 TABLET BY MOUTH EVERY DAY. 90 tablet 1  . HYDROcodone-acetaminophen (NORCO/VICODIN) 5-325 MG per tablet Take 1 tablet by mouth every 8 (eight) hours as needed for moderate pain. 40 tablet 0  . mirtazapine (REMERON) 15 MG tablet Take 1 tablet (15 mg total) by mouth at bedtime. 90 tablet 2  . Multiple Vitamins-Minerals (PRESERVISION/LUTEIN) CAPS Take 1 capsule by mouth daily.    Marland Kitchen omeprazole (PRILOSEC) 20 MG capsule Take 1 capsule (20 mg total) by mouth daily. 30 capsule 3  . SYMBICORT 160-4.5 MCG/ACT inhaler INHALE 2 PUFFS BY MOUTH TWICE DAILY 10.2 g 5  . traMADol (ULTRAM) 50 MG tablet Take 1 tablet (50 mg total) by mouth every 8 (eight) hours as needed. 60 tablet  2   No current facility-administered medications on file prior to visit.    BP 108/70 mmHg  Pulse 68  Temp(Src) 98 F (36.7 C) (Oral)  Resp 18  Ht 5\' 5"  (1.651 m)  Wt 94 lb (42.638 kg)  BMI 15.64 kg/m2  SpO2 93%     Review of Systems  Constitutional: Negative.   HENT: Negative for congestion, dental problem, hearing loss, rhinorrhea, sinus pressure, sore throat and tinnitus.   Eyes: Negative for pain, discharge and visual disturbance.  Respiratory: Negative for cough and shortness of breath.   Cardiovascular: Negative for chest pain, palpitations and leg swelling.  Gastrointestinal: Negative for nausea, vomiting, abdominal pain, diarrhea, constipation, blood in stool and abdominal distention.  Genitourinary: Negative for dysuria, urgency, frequency, hematuria, flank pain, vaginal bleeding, vaginal discharge, difficulty urinating, vaginal pain and  pelvic pain.  Musculoskeletal: Negative for joint swelling, arthralgias and gait problem.  Skin: Negative for rash.  Neurological: Negative for dizziness, syncope, speech difficulty, weakness, numbness and headaches.  Hematological: Negative for adenopathy.  Psychiatric/Behavioral: Negative for behavioral problems, dysphoric mood and agitation. The patient is not nervous/anxious.        Objective:   Physical Exam  Constitutional: She is oriented to person, place, and time. She appears well-developed.  Thin No distress Blood pressure low normal  HENT:  Head: Normocephalic.  Right Ear: External ear normal.  Left Ear: External ear normal.  Mouth/Throat: Oropharynx is clear and moist.  Eyes: Conjunctivae and EOM are normal. Pupils are equal, round, and reactive to light.  Neck: Normal range of motion. Neck supple. No thyromegaly present.  Cardiovascular: Normal rate, regular rhythm, normal heart sounds and intact distal pulses.   Pulmonary/Chest: Effort normal and breath sounds normal.  Abdominal: Soft. Bowel sounds are normal. She exhibits no mass. There is no tenderness.  Musculoskeletal: Normal range of motion.  Lymphadenopathy:    She has no cervical adenopathy.  Neurological: She is alert and oriented to person, place, and time.  Skin: Skin is warm and dry. No rash noted.  Psychiatric: She has a normal mood and affect. Her behavior is normal.           Assessment & Plan:     COPD stable Dysphagia.  Follow-up GI Depression, improved  No change in therapy Recheck 6 months or as needed

## 2015-04-19 NOTE — Patient Instructions (Signed)
Avoids foods high in acid such as tomatoes citrus juices, and spicy foods.  Avoid eating within two hours of lying down or before exercising.  Do not overheat.  Try smaller more frequent meals.  Gastroenterology follow-up with Dr. Arlyce DiceKaplan  As Scheduled  Return in 6 months for follow-up

## 2015-04-19 NOTE — Progress Notes (Signed)
Pre visit review using our clinic review tool, if applicable. No additional management support is needed unless otherwise documented below in the visit note. 

## 2015-05-03 ENCOUNTER — Encounter (HOSPITAL_COMMUNITY): Payer: Self-pay | Admitting: *Deleted

## 2015-05-08 NOTE — Anesthesia Preprocedure Evaluation (Addendum)
Anesthesia Evaluation  Patient identified by MRN, date of birth, ID band Patient awake    Reviewed: Allergy & Precautions, NPO status , Patient's Chart, lab work & pertinent test results, reviewed documented beta blocker date and time   Airway Mallampati: II   Neck ROM: Full    Dental  (+) Dental Advisory Given, Teeth Intact   Pulmonary COPD COPD inhaler, former smoker (quit 1980),  breath sounds clear to auscultation        Cardiovascular Rhythm:Regular     Neuro/Psych Depression    GI/Hepatic Neg liver ROS, dysphagia   Endo/Other  negative endocrine ROS  Renal/GU negative Renal ROS     Musculoskeletal   Abdominal (+)  Abdomen: soft.    Peds  Hematology negative hematology ROS (+)   Anesthesia Other Findings   Reproductive/Obstetrics                            Anesthesia Physical Anesthesia Plan  ASA: II  Anesthesia Plan: MAC   Post-op Pain Management:    Induction: Intravenous  Airway Management Planned:   Additional Equipment:   Intra-op Plan:   Post-operative Plan:   Informed Consent: I have reviewed the patients History and Physical, chart, labs and discussed the procedure including the risks, benefits and alternatives for the proposed anesthesia with the patient or authorized representative who has indicated his/her understanding and acceptance.     Plan Discussed with:   Anesthesia Plan Comments:         Anesthesia Quick Evaluation

## 2015-05-10 ENCOUNTER — Encounter (HOSPITAL_COMMUNITY): Payer: Self-pay

## 2015-05-10 ENCOUNTER — Encounter (HOSPITAL_COMMUNITY)
Admission: RE | Disposition: A | Discharge status: Home / Self Care | Payer: Self-pay | Source: Ambulatory Visit | Attending: Gastroenterology

## 2015-05-10 ENCOUNTER — Ambulatory Visit (HOSPITAL_COMMUNITY): Admit: 2015-05-10 | Payer: Self-pay | Admitting: Gastroenterology

## 2015-05-10 ENCOUNTER — Ambulatory Visit (HOSPITAL_COMMUNITY): Payer: Medicare Other | Admitting: Anesthesiology

## 2015-05-10 ENCOUNTER — Ambulatory Visit (HOSPITAL_COMMUNITY)
Admission: RE | Admit: 2015-05-10 | Discharge: 2015-05-10 | Disposition: A | Discharge status: Home / Self Care | Payer: Medicare Other | Source: Ambulatory Visit | Attending: Gastroenterology | Admitting: Gastroenterology

## 2015-05-10 DIAGNOSIS — K449 Diaphragmatic hernia without obstruction or gangrene: Secondary | ICD-10-CM | POA: Insufficient documentation

## 2015-05-10 DIAGNOSIS — F329 Major depressive disorder, single episode, unspecified: Secondary | ICD-10-CM | POA: Insufficient documentation

## 2015-05-10 DIAGNOSIS — R1310 Dysphagia, unspecified: Secondary | ICD-10-CM | POA: Diagnosis present

## 2015-05-10 DIAGNOSIS — M1990 Unspecified osteoarthritis, unspecified site: Secondary | ICD-10-CM | POA: Diagnosis not present

## 2015-05-10 DIAGNOSIS — K222 Esophageal obstruction: Secondary | ICD-10-CM | POA: Insufficient documentation

## 2015-05-10 DIAGNOSIS — Z9842 Cataract extraction status, left eye: Secondary | ICD-10-CM | POA: Insufficient documentation

## 2015-05-10 DIAGNOSIS — Z880 Allergy status to penicillin: Secondary | ICD-10-CM | POA: Diagnosis not present

## 2015-05-10 DIAGNOSIS — K2970 Gastritis, unspecified, without bleeding: Secondary | ICD-10-CM | POA: Diagnosis not present

## 2015-05-10 DIAGNOSIS — Z9049 Acquired absence of other specified parts of digestive tract: Secondary | ICD-10-CM | POA: Diagnosis not present

## 2015-05-10 DIAGNOSIS — J449 Chronic obstructive pulmonary disease, unspecified: Secondary | ICD-10-CM | POA: Diagnosis not present

## 2015-05-10 DIAGNOSIS — E538 Deficiency of other specified B group vitamins: Secondary | ICD-10-CM | POA: Insufficient documentation

## 2015-05-10 DIAGNOSIS — Z87891 Personal history of nicotine dependence: Secondary | ICD-10-CM | POA: Insufficient documentation

## 2015-05-10 DIAGNOSIS — K2950 Unspecified chronic gastritis without bleeding: Secondary | ICD-10-CM | POA: Diagnosis not present

## 2015-05-10 DIAGNOSIS — Z9841 Cataract extraction status, right eye: Secondary | ICD-10-CM | POA: Diagnosis not present

## 2015-05-10 DIAGNOSIS — R1314 Dysphagia, pharyngoesophageal phase: Secondary | ICD-10-CM

## 2015-05-10 HISTORY — PX: BALLOON DILATION: SHX5330

## 2015-05-10 HISTORY — PX: ESOPHAGOGASTRODUODENOSCOPY (EGD) WITH PROPOFOL: SHX5813

## 2015-05-10 SURGERY — ESOPHAGOGASTRODUODENOSCOPY (EGD) WITH PROPOFOL
Anesthesia: Monitor Anesthesia Care

## 2015-05-10 SURGERY — COLONOSCOPY
Anesthesia: Moderate Sedation

## 2015-05-10 MED ORDER — PROMETHAZINE HCL 25 MG/ML IJ SOLN: 6.2500 mg | INTRAMUSCULAR | Status: DC | PRN

## 2015-05-10 MED ORDER — SODIUM CHLORIDE 0.9 % IV SOLN: INTRAVENOUS | Status: DC

## 2015-05-10 MED ORDER — PROPOFOL INFUSION 10 MG/ML OPTIME
INTRAVENOUS | Status: DC | PRN
  Administered 2015-05-10: 250 ug/kg/min via INTRAVENOUS

## 2015-05-10 MED ORDER — GLYCOPYRROLATE 0.2 MG/ML IJ SOLN
INTRAMUSCULAR | Status: AC
  Filled 2015-05-10: qty 1

## 2015-05-10 MED ORDER — LIDOCAINE HCL (CARDIAC) 20 MG/ML IV SOLN
INTRAVENOUS | Status: AC
  Filled 2015-05-10: qty 5

## 2015-05-10 MED ORDER — LACTATED RINGERS IV SOLN
INTRAVENOUS | Status: DC | PRN
  Administered 2015-05-10: 13:00:00 via INTRAVENOUS

## 2015-05-10 MED ORDER — MEPERIDINE HCL 100 MG/ML IJ SOLN: 6.2500 mg | INTRAMUSCULAR | Status: DC | PRN

## 2015-05-10 MED ORDER — LIDOCAINE HCL (CARDIAC) 20 MG/ML IV SOLN
INTRAVENOUS | Status: DC | PRN
  Administered 2015-05-10: 50 mg via INTRAVENOUS

## 2015-05-10 MED ORDER — PROPOFOL 10 MG/ML IV BOLUS
INTRAVENOUS | Status: AC
  Filled 2015-05-10: qty 20

## 2015-05-10 MED ORDER — GLYCOPYRROLATE 0.2 MG/ML IJ SOLN
INTRAMUSCULAR | Status: DC | PRN
  Administered 2015-05-10: .1 mg via INTRAVENOUS

## 2015-05-10 SURGICAL SUPPLY — 15 items

## 2015-05-10 NOTE — Transfer of Care (Signed)
Immediate Anesthesia Transfer of Care Note  Patient: Sharon [name]  Procedure(s) Performed: Procedure(s) with comments: ESOPHAGOGASTRODUODENOSCOPY (EGD) WITH PROPOFOL (N/A) - balloon dilation BALLOON DILATION (N/A)  Patient Location: PACU  Anesthesia Type:MAC  Level of Consciousness: awake, alert , oriented and patient cooperative  Airway & Oxygen Therapy: Patient Spontanous Breathing and Patient connected to nasal cannula oxygen  Post-op Assessment: Report given to RN, Post -op Vital signs reviewed and stable and Patient moving all extremities X 4  Post vital signs: stable  Last Vitals:  Filed Vitals:   05/10/15 1311  BP: 155/51  Pulse: 61  Temp: 36.6 C  Resp: 17    Complications: No apparent anesthesia complications

## 2015-05-10 NOTE — Anesthesia Postprocedure Evaluation (Signed)
  Anesthesia Post-op Note  Patient: Laray AngerJoanne E Germano  Procedure(s) Performed: Procedure(s) with comments: ESOPHAGOGASTRODUODENOSCOPY (EGD) WITH PROPOFOL (N/A) - balloon dilation BALLOON DILATION (N/A)  Patient Location: PACU  Anesthesia Type:MAC  Level of Consciousness: awake and alert   Airway and Oxygen Therapy: Patient Spontanous Breathing  Post-op Pain: none  Post-op Assessment: Post-op Vital signs reviewed and Patient's Cardiovascular Status Stable  Post-op Vital Signs: Reviewed and stable  Last Vitals:  Filed Vitals:   05/10/15 1311  BP: 155/51  Pulse: 61  Temp: 36.6 C  Resp: 17    Complications: No apparent anesthesia complications

## 2015-05-10 NOTE — H&P (Signed)
HPI :   81yo white female with history of esophageal strictures requiring sequential dilations. Her last EGD with was March 2010 at which time a stricture was seen at the GE junction and balloon dilation was performed. She did well until a year or so ago when dysphagia recurred. Meat is problematic but pills cause the most problems. Patient going on vacation to [place] next week and so is concerned about dining out and food getting stuck. No pain with swallowing. No other GI complaints. She has had some unexplained weight loss recently. Patient denies history of GERD. She was prescribed daily PPI but takes it only a couple of times a week.  Past Medical History  Diagnosis Date  . B12 DEFICIENCY 03/31/2008  . COPD 11/27/2008  . ESOPHAGEAL STRICTURE 01/29/2009  . MASS, LUNG 10/13/2009  . NASAL FRACTURE 08/12/2009  . OSTEOARTHRITIS 03/31/2008  . OSTEOPOROSIS 12/27/2007  . PEDAL EDEMA 12/27/2007  . PULMONARY NODULE 10/21/2009  . RESPIRATORY FAILURE, CHRONIC 06/29/2010   Past Surgical History  Procedure Laterality Date  . Cataract extraction Bilateral     last '05  . Lumbar laminectomy    . Skin graf      '98  . Rectal prolapse repair      '06  . Esophagogastroduodenoscopy      with dilatation x3  . Appendectomy      '38  . Breast surgery      biopsy '47  . Cholecystectomy      laparoscopic'10  . Nasal reconstruction with septal repair      '10    [facility]: COPD, gastric cancer.   Current Outpatient Prescriptions  Medication Sig Dispense Refill  . ALPRAZolam (XANAX) 0.25 MG tablet Take 1 tablet (0.25 mg total) by mouth 3 (three) times daily as needed. 60 tablet 5  . budesonide-formoterol (SYMBICORT) 160-4.5 MCG/ACT inhaler INHALE 2 PUFFS BY MOUTH TWICE DAILY 10.2 g 5  . Calcium Carbonate-Vitamin D (CALTRATE 600+D) 600-400 MG-UNIT per chew tablet Chew 2 tablets by mouth  daily.     . cyanocobalamin (,VITAMIN B-12,) 1000 MCG/ML injection INJECT 1 ML IN THE MUSCLE ONCE MONTHLY 10 mL 0  . escitalopram (LEXAPRO) 5 MG tablet TAKE 1 TABLET BY MOUTH DAILY 90 tablet 1  . HYDROcodone-acetaminophen (NORCO/VICODIN) 5-325 MG per tablet Take 1 tablet by mouth every 8 (eight) hours as needed for moderate pain. 40 tablet 0  . mirtazapine (REMERON) 15 MG tablet Take 1 tablet (15 mg total) by mouth at bedtime. 90 tablet 2  . Multiple Vitamins-Minerals (PRESERVISION/LUTEIN) CAPS Take 1 capsule by mouth daily.    Marland Kitchen omeprazole (PRILOSEC) 20 MG capsule Take 1 capsule (20 mg total) by mouth daily. 30 capsule 3  . traMADol (ULTRAM) 50 MG tablet Take 1 tablet (50 mg total) by mouth every 8 (eight) hours as needed. 60 tablet 2   No current facility-administered medications for this visit.   Allergies  Allergen Reactions  . Penicillins     REACTION: unspecified     Review of Systems: All systems reviewed and negative except where noted in HPI.    Physical Exam: BP 142/84 mmHg  Pulse 82  Ht  (1.651 m)  Wt 93 lb 9.6 oz (42.457 kg)  BMI 15.58 kg/m2 Constitutional: Pleasant, thin white female in no acute distress. HEENT: Normocephalic and atraumatic. Conjunctivae are normal. No scleral icterus. Neck supple.  Cardiovascular: Normal rate, regular rhythm.  Pulmonary/chest: Effort normal and breath sounds normal. No wheezing, rales or rhonchi. Abdominal: Soft, nondistended, nontender.  Bowel sounds active throughout. There are no masses palpable. No hepatomegaly. Extremities: no edema Lymphadenopathy: No cervical adenopathy noted. Neurological: Alert and oriented to person place and time. Skin: Skin is warm and dry. No rashes noted. Psychiatric: Normal mood and affect. Behavior is normal.   ASSESSMENT AND PLAN:  81 year old female with recurrent solid food dysphagia and history of esophageal strictures requiring  sequential dilations, last one done in 2010. For further evaluation and treatment patient will be scheduled for EGD with balloon dilation . Procedure will be done at the hospital. The benefits, risks, and potential complications of EGD with possible biopsies and/or dilation were discussed with the patient and she agrees to proceed.

## 2015-05-10 NOTE — Discharge Instructions (Signed)
Gastrointestinal Endoscopy, Care After °Refer to this sheet in the next few weeks. These instructions provide you with information on caring for yourself after your procedure. Your caregiver may also give you more specific instructions. Your treatment has been planned according to current medical practices, but problems sometimes occur. Call your caregiver if you have any problems or questions after your procedure. °HOME CARE INSTRUCTIONS °· If you were given medicine to help you relax (sedative), do not drive, operate machinery, or sign important documents for 24 hours. °· Avoid alcohol and hot or warm beverages for the first 24 hours after the procedure. °· Only take over-the-counter or prescription medicines for pain, discomfort, or fever as directed by your caregiver. You may resume taking your normal medicines unless your caregiver tells you otherwise. Ask your caregiver when you may resume taking medicines that may cause bleeding, such as aspirin, clopidogrel, or warfarin. °· You may return to your normal diet and activities on the day after your procedure, or as directed by your caregiver. Walking may help to reduce any bloated feeling in your abdomen. °· Drink enough fluids to keep your urine clear or pale yellow. °· You may gargle with salt water if you have a sore throat. °SEEK IMMEDIATE MEDICAL CARE IF: °· You have severe nausea or vomiting. °· You have severe abdominal pain, abdominal cramps that last longer than 6 hours, or abdominal swelling (distention). °· You have severe shoulder or back pain. °· You have trouble swallowing. °· You have shortness of breath, your breathing is shallow, or you are breathing faster than normal. °· You have a fever or a rapid heartbeat. °· You vomit blood or material that looks like coffee grounds. °· You have bloody, black, or tarry stools. °MAKE SURE YOU: °· Understand these instructions. °· Will watch your condition. °· Will get help right away if you are not doing  well or get worse. °Document Released: 07/18/2004 Document Revised: 04/20/2014 Document Reviewed: 03/05/2012 °[employer]® Patient Information ©2015 [employer]. This information is not intended to replace advice given to you by your health care provider. Make sure you discuss any questions you have with your health care provider. ° °

## 2015-05-10 NOTE — Op Note (Signed)
[facility] [address]  [place], [id number]   ENDOSCOPY PROCEDURE REPORT  PATIENT: Sharon [name], [name]  MR#: [id number] BIRTHDATE: 08-24-1923 , 89  yrs. old GENDER: female  ENDOSCOPIST: Louis Meckelobert D Kaplan, MD REFERRED XB:JYNWGBY:Peter Amador CunasKwiatkowski, M.D.  PROCEDURE DATE: 05/10/2015 PROCEDURE:   EGD w/ balloon dilation  INDICATIONS:dysphagia. MEDICATIONS: Monitored anesthesia care TOPICAL ANESTHETIC: ASA CLASS:    Class III  DESCRIPTION OF PROCEDURE:     Physical exam was performed.  Informed consent was obtained from the patient after explaining the benefits, risks, and alternatives to the procedure.  The patient was connected to the monitor and placed in the left lateral position.  Continuous oxygen was provided by nasal cannula and IV medicine administered through an indwelling cannula.  After administration of sedation, the patients esophagus was intubated and the Pentax Gastroscope [id number]  endoscope was advanced under direct visualization to the second portion of the duodenum.  The scope was removed slowly by carefully examining the color, texture, anatomy, and integrity of the mucosa on the way out.  The patient was recovered in endoscopy and discharged home in satisfactory condition.      ESOPHAGUS: There was a fibrotic stricture at the gastroesophageal junction.  The stricture was traversable.  Using a TTS-balloon the stricture was dilated up to 15mm. (09-28-11-12.8-14-1115mm balloons for 15-30 seconds each).  There was moderate resistance beginning with the 12mm balloon.) Following this dilation, there was a small amount of heme and a small mucosal rent.  STOMACH: Chronic gastritis (inflammation) was found in the gastric antrum and gastric body.   A 3 cm hiatal hernia was noted. Retroflexed views revealed no abnormalities. COMPLICATIONS: There were no immediate complications.  ENDOSCOPIC IMPRESSION: 1.   There was a stricture at the gastroesophageal  junction; Using a TTS-balloon the stricture was dilated up to 15mm; Following this dilation, there was a small amount of heme and a small mucosal rent  2.   Chronic gastritis (inflammation) was found in the gastric antrum and gastric body 3.   3 cm hiatal hernia RECOMMENDATIONS: Repeat dilation in 3-4 weeks Continue PPI therapy  REPEAT EXAM:   _______________________________ eSignedLouis Meckel:  Robert D Kaplan, MD 05/10/2015 1:04 PM     CPT CODES: ICD CODES:  The ICD and CPT codes recommended by this software are interpretations from the data that the clinical staff has captured with the software.  The verification of the translation of this report to the ICD and CPT codes and modifiers is the sole responsibility of the health care institution and practicing physician where this report was generated.  [employer] will not be held responsible for the validity of the ICD and CPT codes included on this report.  AMA assumes no liability for data contained or not contained herein. CPT is a Publishing rights managerregistered trademark of the [employer].   PATIENT NAME:  Sharon [name], Karyss E MR#: [id number]

## 2015-05-11 ENCOUNTER — Encounter (HOSPITAL_COMMUNITY): Payer: Self-pay | Admitting: Gastroenterology

## 2015-06-23 ENCOUNTER — Telehealth: Payer: Self-pay | Admitting: Gastroenterology

## 2015-06-23 ENCOUNTER — Other Ambulatory Visit: Payer: Self-pay

## 2015-06-23 DIAGNOSIS — R1314 Dysphagia, pharyngoesophageal phase: Secondary | ICD-10-CM

## 2015-06-23 NOTE — Telephone Encounter (Signed)
Patient scheduled for 08/02/15. Declined earlier appointment.

## 2015-07-02 ENCOUNTER — Encounter: Payer: Self-pay | Admitting: Adult Health

## 2015-07-02 ENCOUNTER — Emergency Department (HOSPITAL_COMMUNITY): Payer: Medicare Other

## 2015-07-02 ENCOUNTER — Ambulatory Visit (INDEPENDENT_AMBULATORY_CARE_PROVIDER_SITE_OTHER): Payer: Federal, State, Local not specified - PPO | Admitting: Adult Health

## 2015-07-02 ENCOUNTER — Inpatient Hospital Stay (HOSPITAL_COMMUNITY)
Admission: EM | Admit: 2015-07-02 | Discharge: 2015-07-04 | DRG: 871 | Disposition: A | Discharge status: Home / Self Care | Payer: Medicare Other | Attending: Internal Medicine | Admitting: Internal Medicine

## 2015-07-02 ENCOUNTER — Encounter (HOSPITAL_COMMUNITY): Payer: Self-pay

## 2015-07-02 VITALS — BP 132/72 | HR 101 | Temp 99.3°F | Ht 65.0 in | Wt 97.7 lb

## 2015-07-02 DIAGNOSIS — Z681 Body mass index (BMI) 19 or less, adult: Secondary | ICD-10-CM | POA: Diagnosis not present

## 2015-07-02 DIAGNOSIS — Z8249 Family history of ischemic heart disease and other diseases of the circulatory system: Secondary | ICD-10-CM | POA: Diagnosis not present

## 2015-07-02 DIAGNOSIS — J189 Pneumonia, unspecified organism: Secondary | ICD-10-CM | POA: Diagnosis not present

## 2015-07-02 DIAGNOSIS — K222 Esophageal obstruction: Secondary | ICD-10-CM | POA: Diagnosis present

## 2015-07-02 DIAGNOSIS — Z79891 Long term (current) use of opiate analgesic: Secondary | ICD-10-CM | POA: Diagnosis not present

## 2015-07-02 DIAGNOSIS — J441 Chronic obstructive pulmonary disease with (acute) exacerbation: Secondary | ICD-10-CM | POA: Diagnosis present

## 2015-07-02 DIAGNOSIS — E871 Hypo-osmolality and hyponatremia: Secondary | ICD-10-CM

## 2015-07-02 DIAGNOSIS — D649 Anemia, unspecified: Secondary | ICD-10-CM | POA: Diagnosis present

## 2015-07-02 DIAGNOSIS — Z87891 Personal history of nicotine dependence: Secondary | ICD-10-CM | POA: Diagnosis not present

## 2015-07-02 DIAGNOSIS — E46 Unspecified protein-calorie malnutrition: Secondary | ICD-10-CM | POA: Diagnosis present

## 2015-07-02 DIAGNOSIS — M1990 Unspecified osteoarthritis, unspecified site: Secondary | ICD-10-CM | POA: Diagnosis present

## 2015-07-02 DIAGNOSIS — M810 Age-related osteoporosis without current pathological fracture: Secondary | ICD-10-CM | POA: Diagnosis present

## 2015-07-02 DIAGNOSIS — Z79899 Other long term (current) drug therapy: Secondary | ICD-10-CM

## 2015-07-02 DIAGNOSIS — E860 Dehydration: Secondary | ICD-10-CM | POA: Diagnosis present

## 2015-07-02 DIAGNOSIS — J9621 Acute and chronic respiratory failure with hypoxia: Secondary | ICD-10-CM | POA: Diagnosis present

## 2015-07-02 DIAGNOSIS — R0600 Dyspnea, unspecified: Secondary | ICD-10-CM | POA: Diagnosis not present

## 2015-07-02 DIAGNOSIS — J9601 Acute respiratory failure with hypoxia: Secondary | ICD-10-CM | POA: Diagnosis not present

## 2015-07-02 DIAGNOSIS — E538 Deficiency of other specified B group vitamins: Secondary | ICD-10-CM | POA: Diagnosis present

## 2015-07-02 DIAGNOSIS — Z880 Allergy status to penicillin: Secondary | ICD-10-CM

## 2015-07-02 DIAGNOSIS — R0603 Acute respiratory distress: Secondary | ICD-10-CM

## 2015-07-02 DIAGNOSIS — J449 Chronic obstructive pulmonary disease, unspecified: Secondary | ICD-10-CM | POA: Insufficient documentation

## 2015-07-02 DIAGNOSIS — A419 Sepsis, unspecified organism: Secondary | ICD-10-CM | POA: Diagnosis present

## 2015-07-02 DIAGNOSIS — Z8709 Personal history of other diseases of the respiratory system: Secondary | ICD-10-CM | POA: Diagnosis not present

## 2015-07-02 DIAGNOSIS — R0602 Shortness of breath: Secondary | ICD-10-CM | POA: Diagnosis not present

## 2015-07-02 DIAGNOSIS — J439 Emphysema, unspecified: Secondary | ICD-10-CM | POA: Diagnosis not present

## 2015-07-02 LAB — BASIC METABOLIC PANEL
Anion gap: 11 (ref 5–15)
BUN: 16 mg/dL (ref 6–20)
CALCIUM: 8.9 mg/dL (ref 8.9–10.3)
CHLORIDE: 92 mmol/L — AB (ref 101–111)
CO2: 25 mmol/L (ref 22–32)
CREATININE: 0.54 mg/dL (ref 0.44–1.00)
GFR calc non Af Amer: >60 mL/min (ref >60)
GLUCOSE: 105 mg/dL — AB (ref 65–99)
Potassium: 4 mmol/L (ref 3.5–5.1)
Sodium: 128 mmol/L — ABNORMAL LOW (ref 135–145)

## 2015-07-02 LAB — CBC WITH DIFFERENTIAL/PLATELET
Basophils Absolute: 0 10*3/uL (ref 0.0–0.1)
Basophils Relative: 0 % (ref 0–1)
Eosinophils Absolute: 0.1 10*3/uL (ref 0.0–0.7)
Eosinophils Relative: 1 % (ref 0–5)
HCT: 31.8 % — ABNORMAL LOW (ref 36.0–46.0)
Hemoglobin: 10.4 g/dL — ABNORMAL LOW (ref 12.0–15.0)
LYMPHS PCT: 13 % (ref 12–46)
Lymphs Abs: 1.3 10*3/uL (ref 0.7–4.0)
MCH: 30.4 pg (ref 26.0–34.0)
MCHC: 32.7 g/dL (ref 30.0–36.0)
MCV: 93 fL (ref 78.0–100.0)
MONOS PCT: 16 % — AB (ref 3–12)
Monocytes Absolute: 1.7 10*3/uL — ABNORMAL HIGH (ref 0.1–1.0)
NEUTROS PCT: 70 % (ref 43–77)
Neutro Abs: 7.4 10*3/uL (ref 1.7–7.7)
PLATELETS: 284 10*3/uL (ref 150–400)
RBC: 3.42 MIL/uL — ABNORMAL LOW (ref 3.87–5.11)
RDW: 13.4 % (ref 11.5–15.5)
WBC: 10.5 10*3/uL (ref 4.0–10.5)

## 2015-07-02 LAB — I-STAT CG4 LACTIC ACID, ED
Lactic Acid, Venous: 1.06 mmol/L (ref 0.5–2.0)
Lactic Acid, Venous: 1.15 mmol/L (ref 0.5–2.0)

## 2015-07-02 MED ORDER — ONDANSETRON HCL 4 MG PO TABS: 4.0000 mg | ORAL_TABLET | Freq: Four times a day (QID) | ORAL | Status: DC | PRN

## 2015-07-02 MED ORDER — BENZONATATE 100 MG PO CAPS
100.0000 mg | ORAL_CAPSULE | Freq: Three times a day (TID) | ORAL | Status: DC | PRN
  Administered 2015-07-02 – 2015-07-04 (×2): 100 mg via ORAL
  Filled 2015-07-02 (×2): qty 1

## 2015-07-02 MED ORDER — ACETAMINOPHEN 325 MG PO TABS: 650.0000 mg | ORAL_TABLET | Freq: Four times a day (QID) | ORAL | Status: DC | PRN

## 2015-07-02 MED ORDER — BUDESONIDE-FORMOTEROL FUMARATE 160-4.5 MCG/ACT IN AERO
2.0000 | INHALATION_SPRAY | Freq: Two times a day (BID) | RESPIRATORY_TRACT | Status: DC
  Administered 2015-07-03: 2 via RESPIRATORY_TRACT
  Filled 2015-07-02: qty 6

## 2015-07-02 MED ORDER — TRAMADOL HCL 50 MG PO TABS: 50.0000 mg | ORAL_TABLET | Freq: Four times a day (QID) | ORAL | Status: DC | PRN

## 2015-07-02 MED ORDER — CEFTRIAXONE SODIUM IN DEXTROSE 20 MG/ML IV SOLN
1.0000 g | INTRAVENOUS | Status: DC
  Administered 2015-07-03: 1 g via INTRAVENOUS
  Filled 2015-07-02 (×2): qty 50

## 2015-07-02 MED ORDER — ALPRAZOLAM 0.25 MG PO TABS: 0.2500 mg | ORAL_TABLET | Freq: Three times a day (TID) | ORAL | Status: DC | PRN

## 2015-07-02 MED ORDER — HYDROCODONE-ACETAMINOPHEN 5-325 MG PO TABS
1.0000 | ORAL_TABLET | Freq: Three times a day (TID) | ORAL | Status: DC | PRN
  Filled 2015-07-02: qty 1

## 2015-07-02 MED ORDER — ACETAMINOPHEN 650 MG RE SUPP: 650.0000 mg | Freq: Four times a day (QID) | RECTAL | Status: DC | PRN

## 2015-07-02 MED ORDER — DEXTROSE 5 % IV SOLN
500.0000 mg | INTRAVENOUS | Status: DC
  Administered 2015-07-03: 500 mg via INTRAVENOUS
  Filled 2015-07-02: qty 500

## 2015-07-02 MED ORDER — DEXTROSE 5 % IV SOLN
500.0000 mg | Freq: Once | INTRAVENOUS | Status: AC
  Administered 2015-07-02: 500 mg via INTRAVENOUS
  Filled 2015-07-02: qty 500

## 2015-07-02 MED ORDER — SODIUM CHLORIDE 0.9 % IV BOLUS (SEPSIS)
500.0000 mL | INTRAVENOUS | Status: AC
  Administered 2015-07-02: 500 mL via INTRAVENOUS

## 2015-07-02 MED ORDER — SODIUM CHLORIDE 0.9 % IV BOLUS (SEPSIS)
1000.0000 mL | Freq: Once | INTRAVENOUS | Status: AC
  Administered 2015-07-02: 1000 mL via INTRAVENOUS

## 2015-07-02 MED ORDER — ENOXAPARIN SODIUM 30 MG/0.3ML ~~LOC~~ SOLN
30.0000 mg | Freq: Every day | SUBCUTANEOUS | Status: DC
  Administered 2015-07-02: 30 mg via SUBCUTANEOUS
  Filled 2015-07-02 (×3): qty 0.3

## 2015-07-02 MED ORDER — SODIUM CHLORIDE 0.9 % IV SOLN
INTRAVENOUS | Status: DC
  Administered 2015-07-02: 23:00:00 via INTRAVENOUS

## 2015-07-02 MED ORDER — MIRTAZAPINE 15 MG PO TABS
15.0000 mg | ORAL_TABLET | Freq: Every day | ORAL | Status: DC
  Administered 2015-07-02 – 2015-07-03 (×2): 15 mg via ORAL
  Filled 2015-07-02 (×3): qty 1

## 2015-07-02 MED ORDER — DEXTROSE 5 % IV SOLN
1.0000 g | Freq: Once | INTRAVENOUS | Status: AC
  Administered 2015-07-02: 1 g via INTRAVENOUS
  Filled 2015-07-02: qty 10

## 2015-07-02 MED ORDER — ESCITALOPRAM OXALATE 5 MG PO TABS
5.0000 mg | ORAL_TABLET | Freq: Every day | ORAL | Status: DC
  Administered 2015-07-03 – 2015-07-04 (×2): 5 mg via ORAL
  Filled 2015-07-02 (×3): qty 1

## 2015-07-02 MED ORDER — PANTOPRAZOLE SODIUM 40 MG PO TBEC
40.0000 mg | DELAYED_RELEASE_TABLET | Freq: Every day | ORAL | Status: DC
  Administered 2015-07-03 – 2015-07-04 (×2): 40 mg via ORAL
  Filled 2015-07-02 (×3): qty 1

## 2015-07-02 MED ORDER — IPRATROPIUM-ALBUTEROL 0.5-2.5 (3) MG/3ML IN SOLN
3.0000 mL | Freq: Once | RESPIRATORY_TRACT | Status: AC
  Administered 2015-07-02: 3 mL via RESPIRATORY_TRACT

## 2015-07-02 MED ORDER — ONDANSETRON HCL 4 MG/2ML IJ SOLN: 4.0000 mg | Freq: Four times a day (QID) | INTRAMUSCULAR | Status: DC | PRN

## 2015-07-02 MED ORDER — AZITHROMYCIN 500 MG IV SOLR: 500.0000 mg | INTRAVENOUS | Status: DC

## 2015-07-02 MED ORDER — DEXTROSE 5 % IV SOLN: 1.0000 g | INTRAVENOUS | Status: DC

## 2015-07-02 NOTE — ED Notes (Signed)
Delay with lab draw, pt getting exray

## 2015-07-02 NOTE — Progress Notes (Signed)
Pre visit review using our clinic review tool, if applicable. No additional management support is needed unless otherwise documented below in the visit note. 

## 2015-07-02 NOTE — ED Notes (Signed)
Below order not completed by EW. 

## 2015-07-02 NOTE — ED Notes (Addendum)
Below orders not completed by EW. 

## 2015-07-02 NOTE — ED Provider Notes (Signed)
CSN: [id number]     Arrival date & time 07/02/15  1637 History   First MD Initiated Contact with Patient 07/02/15 1712     Chief Complaint  Patient presents with  . Cough  . Possible pneumonia      (Consider location/radiation/quality/duration/timing/severity/associated sxs/prior Treatment) HPI Complains of nonproductive cough for the past 3 days, accompanied by shortness of breath. No vomiting no other associated symptoms. Seen atDr. Ninfa LindenKwiatowski's office earlier today received DuoNeb with some improvement of breathing. Maximum temperature 90.9 degrees at home. No other treatment prior to coming here nothing makes symptoms better or worse. Past Medical History  Diagnosis Date  . B12 DEFICIENCY 03/31/2008  . COPD 11/27/2008  . ESOPHAGEAL STRICTURE 01/29/2009  . MASS, LUNG 10/13/2009  . NASAL FRACTURE 08/12/2009  . OSTEOARTHRITIS 03/31/2008  . OSTEOPOROSIS 12/27/2007  . PEDAL EDEMA 12/27/2007  . PULMONARY NODULE 10/21/2009  . RESPIRATORY FAILURE, CHRONIC 06/29/2010   Past Surgical History  Procedure Laterality Date  . Cataract extraction Bilateral     last '05  . Lumbar laminectomy    . Skin graf      '98  . Rectal prolapse repair      '06  . Esophagogastroduodenoscopy      with dilatation x3  . Appendectomy      '38  . Breast surgery      biopsy '47  . Cholecystectomy      laparoscopic'10  . Nasal reconstruction with septal repair      '10  . Esophagogastroduodenoscopy (egd) with propofol N/A 05/10/2015    Procedure: ESOPHAGOGASTRODUODENOSCOPY (EGD) WITH PROPOFOL;  Surgeon: Louis Meckelobert D Kaplan, MD;  Location: [facility];  Service: Endoscopy;  Laterality: N/A;  balloon dilation  . Balloon dilation N/A 05/10/2015    Procedure: BALLOON DILATION;  Surgeon: Louis Meckelobert D Kaplan, MD;  Location: [facility]COPY;  Service: Endoscopy;  Laterality: N/A;   No family history on file. History  Substance Use Topics  . Smoking status: Former Smoker    Quit date: 12/18/1978  . Smokeless tobacco: Never  Used  . Alcohol Use: 0.0 oz/week    0 Standard drinks or equivalent per week     Comment: couple of gin and tonics daily   OB History    No data available     Review of Systems  Constitutional: Negative.   HENT: Negative.   Respiratory: Positive for cough and shortness of breath.   Cardiovascular: Negative.   Gastrointestinal: Negative.   Musculoskeletal: Negative.   Skin: Negative.   Neurological: Negative.   Psychiatric/Behavioral: Negative.   All other systems reviewed and are negative.     Allergies  Penicillins  Home Medications   Prior to Admission medications   Medication Sig Start Date End Date Taking? Authorizing Provider  ALPRAZolam (XANAX) 0.25 MG tablet Take 1 tablet (0.25 mg total) by mouth 3 (three) times daily as needed. Patient taking differently: Take 0.25 mg by mouth 3 (three) times daily as needed for anxiety.  02/25/15  Yes Gordy SaversPeter F Kwiatkowski, MD  Calcium Carbonate-Vitamin D (CALTRATE 600+D) 600-400 MG-UNIT per chew tablet Chew 1 tablet by mouth daily.    Yes Historical Provider, MD  cyanocobalamin (,VITAMIN B-12,) 1000 MCG/ML injection INJECT 1 ML IN THE MUSCLE ONCE MONTHLY Patient taking differently: Inject 1 ml in the muscle once monthly. 11/11/14  Yes Gordy SaversPeter F Kwiatkowski, MD  escitalopram (LEXAPRO) 5 MG tablet TAKE 1 TABLET BY MOUTH EVERY DAY. Patient taking differently: Take 1 tablet by mouth every day. 02/26/15  Yes Theron AristaPeter  Lysle Dingwall, MD  mirtazapine (REMERON) 15 MG tablet Take 1 tablet (15 mg total) by mouth at bedtime. 02/15/15  Yes Gordy Savers, MD  Multiple Vitamins-Minerals (PRESERVISION/LUTEIN) CAPS Take 1 capsule by mouth daily.   Yes Historical Provider, MD  omeprazole (PRILOSEC) 20 MG capsule Take 1 capsule (20 mg total) by mouth daily. 02/15/15  Yes Gordy Savers, MD  Polyethyl Glycol-Propyl Glycol (SYSTANE OP) Place 1 drop into both eyes as needed (dry eyes.).   Yes Historical Provider, MD  SYMBICORT 160-4.5 MCG/ACT inhaler  INHALE 2 PUFFS BY MOUTH TWICE DAILY Patient taking differently: Inhale 2 puffs by mouth twice daily. 02/26/15  Yes Gordy Savers, MD  HYDROcodone-acetaminophen (NORCO/VICODIN) 5-325 MG per tablet Take 1 tablet by mouth every 8 (eight) hours as needed for moderate pain. Patient not taking: Reported on 04/22/2015 02/15/15   Gordy Savers, MD  traMADol (ULTRAM) 50 MG tablet Take 1 tablet (50 mg total) by mouth every 8 (eight) hours as needed. Patient not taking: Reported on 07/02/2015 02/15/15   Gordy Savers, MD   There were no vitals taken for this visit. Physical Exam  Constitutional: She appears well-developed and well-nourished. No distress.  HENT:  Head: Normocephalic and atraumatic.  Eyes: Conjunctivae are normal. Pupils are equal, round, and reactive to light.  Neck: Neck supple. No tracheal deviation present. No thyromegaly present.  Cardiovascular: Normal rate and regular rhythm.   No murmur heard. Pulmonary/Chest: Effort normal and breath sounds normal. No respiratory distress.  Rales left base  Abdominal: Soft. Bowel sounds are normal. She exhibits no distension. There is no tenderness.  Musculoskeletal: Normal range of motion. She exhibits no edema or tenderness.  Neurological: She is alert. Coordination normal.  Skin: Skin is warm and dry. No rash noted.  Psychiatric: She has a normal mood and affect.  Nursing note and vitals reviewed.   ED Course  Procedures (including critical care time) Labs Review Labs Reviewed  BASIC METABOLIC PANEL  CBC WITH DIFFERENTIAL/PLATELET    Imaging Review No results found.   EKG Interpretation None     Chest x-ray viewed by me Results for orders placed or performed during the hospital encounter of 07/02/15  Basic metabolic panel  Result Value Ref Range   Sodium 128 (L) 135 - 145 mmol/L   Potassium 4.0 3.5 - 5.1 mmol/L   Chloride 92 (L) 101 - 111 mmol/L   CO2 25 22 - 32 mmol/L   Glucose, Bld 105 (H) 65 - 99 mg/dL    BUN 16 6 - 20 mg/dL   Creatinine, Ser 4.78 0.44 - 1.00 mg/dL   Calcium 8.9 8.9 - 29.5 mg/dL   GFR calc non Af Amer >60 >60 mL/min   GFR calc Af Amer >60 >60 mL/min   Anion gap 11 5 - 15  CBC with Differential  Result Value Ref Range   WBC 10.5 4.0 - 10.5 K/uL   RBC 3.42 (L) 3.87 - 5.11 MIL/uL   Hemoglobin 10.4 (L) 12.0 - 15.0 g/dL   HCT 62.1 (L) 30.8 - 65.7 %   MCV 93.0 78.0 - 100.0 fL   MCH 30.4 26.0 - 34.0 pg   MCHC 32.7 30.0 - 36.0 g/dL   RDW 84.6 96.2 - 95.2 %   Platelets 284 150 - 400 K/uL   Neutrophils Relative % 70 43 - 77 %   Neutro Abs 7.4 1.7 - 7.7 K/uL   Lymphocytes Relative 13 12 - 46 %   Lymphs Abs 1.3  0.7 - 4.0 K/uL   Monocytes Relative 16 (H) 3 - 12 %   Monocytes Absolute 1.7 (H) 0.1 - 1.0 K/uL   Eosinophils Relative 1 0 - 5 %   Eosinophils Absolute 0.1 0.0 - 0.7 K/uL   Basophils Relative 0 0 - 1 %   Basophils Absolute 0.0 0.0 - 0.1 K/uL  I-Stat CG4 Lactic Acid, ED  (not at  [facility])  Result Value Ref Range   Lactic Acid, Venous 1.06 0.5 - 2.0 mmol/L   Dg Chest Portable 1 View  07/02/2015   CLINICAL DATA:  Cough, fever for 3 days.  COPD.  EXAM: PORTABLE CHEST - 1 VIEW  COMPARISON:  06/29/2010  FINDINGS: The lungs are hyperinflated likely secondary to COPD. There is bilateral diffuse interstitial thickening likely chronic. There is right lower lobe hazy airspace disease concerning for pneumonia. There is no pleural effusion or pneumothorax. The heart and mediastinal contours are unremarkable.  The osseous structures are unremarkable.  IMPRESSION: 1. Right lower lobe hazy airspace disease most concerning for pneumonia. Followup PA and lateral chest X-ray is recommended in 3-4 weeks following trial of antibiotic therapy to ensure resolution and exclude underlying malignancy. 2. COPD.   Electronically Signed   By: Elige Ko   On: 07/02/2015 18:37    MDM   Final diagnoses:  None   Code sepsis called based on Sirs criteria of respiratory rate and heart rate. Source  being respiratory Spoke with Dr. Toniann Fail plAN ADMIT FOR IV ANTIBIOTICS, , AND OXYGEN THERAPY  Dx#1 Community Acquired Pneumonia #2 hypoxia #3hyponatremia #4 anemia     Doug Sou, MD 07/02/15 2025

## 2015-07-02 NOTE — Progress Notes (Signed)
Subjective:    Patient ID: Sharon AngerJoanne E Spiers, female    DOB: 1923/12/01, 81 y.o.   MRN: [id number]  HPI  81 year old female who presents to the office today with the chief complaint of three days of dry cough and SOB with associated chest wall pain with cough. She is accompanied by her daughter during this visit.   She has  a history of COPD and uses Symbicort- has not noticed any difference in her SOB after using it for the last three days. Her oxygen saturation normally runs 90 %. Upon presentation to the office today she was found to be 85% on RA. She does not wear oxygen at home. Is a former smoker.   Denies fevers at home. In the office with low grade 99.3 fever. No nausea or vomiting. No sick contacts.     Review of Systems  Constitutional: Positive for activity change and fatigue. Negative for fever and chills.  HENT: Negative.   Eyes: Negative.   Respiratory: Positive for cough, chest tightness and shortness of breath. Negative for apnea, wheezing and stridor.   Cardiovascular: Positive for chest pain. Negative for palpitations and leg swelling.  Gastrointestinal: Negative.   Skin: Negative.   Neurological: Negative.   Hematological: Negative.   Psychiatric/Behavioral: Negative.   All other systems reviewed and are negative.  Past Medical History  Diagnosis Date  . B12 DEFICIENCY 03/31/2008  . COPD 11/27/2008  . ESOPHAGEAL STRICTURE 01/29/2009  . MASS, LUNG 10/13/2009  . NASAL FRACTURE 08/12/2009  . OSTEOARTHRITIS 03/31/2008  . OSTEOPOROSIS 12/27/2007  . PEDAL EDEMA 12/27/2007  . PULMONARY NODULE 10/21/2009  . RESPIRATORY FAILURE, CHRONIC 06/29/2010    History   Social History  . Marital Status: Widowed    Spouse Name: N/A  . Number of Children: N/A  . Years of Education: N/A   Occupational History  . Not on file.   Social History Main Topics  . Smoking status: Former Smoker    Quit date: 12/18/1978  . Smokeless tobacco: Never Used  . Alcohol Use: 0.0 oz/week   0 Standard drinks or equivalent per week     Comment: couple of gin and tonics daily  . Drug Use: No  . Sexual Activity: Not on file   Other Topics Concern  . Not on file   Social History Narrative    Past Surgical History  Procedure Laterality Date  . Cataract extraction Bilateral     last '05  . Lumbar laminectomy    . Skin graf      '98  . Rectal prolapse repair      '06  . Esophagogastroduodenoscopy      with dilatation x3  . Appendectomy      '38  . Breast surgery      biopsy '47  . Cholecystectomy      laparoscopic'10  . Nasal reconstruction with septal repair      '10  . Esophagogastroduodenoscopy (egd) with propofol N/A 05/10/2015    Procedure: ESOPHAGOGASTRODUODENOSCOPY (EGD) WITH PROPOFOL;  Surgeon: Louis Meckelobert D Kaplan, MD;  Location: [facility];  Service: Endoscopy;  Laterality: N/A;  balloon dilation  . Balloon dilation N/A 05/10/2015    Procedure: BALLOON DILATION;  Surgeon: Louis Meckelobert D Kaplan, MD;  Location: [facility];  Service: Endoscopy;  Laterality: N/A;    No family history on file.  Allergies  Allergen Reactions  . Penicillins     REACTION: unspecified    Current Outpatient Prescriptions on File Prior to Visit  Medication Sig Dispense Refill  . ALPRAZolam (XANAX) 0.25 MG tablet Take 1 tablet (0.25 mg total) by mouth 3 (three) times daily as needed. 90 tablet 0  . Calcium Carbonate-Vitamin D (CALTRATE 600+D) 600-400 MG-UNIT per chew tablet Chew 2 tablets by mouth daily.      . cyanocobalamin (,VITAMIN B-12,) 1000 MCG/ML injection INJECT 1 ML IN THE MUSCLE ONCE MONTHLY 10 mL 0  . escitalopram (LEXAPRO) 5 MG tablet TAKE 1 TABLET BY MOUTH EVERY DAY. 90 tablet 1  . mirtazapine (REMERON) 15 MG tablet Take 1 tablet (15 mg total) by mouth at bedtime. 90 tablet 2  . Multiple Vitamins-Minerals (PRESERVISION/LUTEIN) CAPS Take 1 capsule by mouth daily.    Marland Kitchen omeprazole (PRILOSEC) 20 MG capsule Take 1 capsule (20 mg total) by mouth daily. 30 capsule 3  .  SYMBICORT 160-4.5 MCG/ACT inhaler INHALE 2 PUFFS BY MOUTH TWICE DAILY 10.2 g 5  . HYDROcodone-acetaminophen (NORCO/VICODIN) 5-325 MG per tablet Take 1 tablet by mouth every 8 (eight) hours as needed for moderate pain. (Patient not taking: Reported on 04/22/2015) 40 tablet 0  . traMADol (ULTRAM) 50 MG tablet Take 1 tablet (50 mg total) by mouth every 8 (eight) hours as needed. (Patient not taking: Reported on 07/02/2015) 60 tablet 2   No current facility-administered medications on file prior to visit.    BP 132/72 mmHg  Pulse 101  Temp(Src) 99.3 F (37.4 C) (Oral)  Ht  (1.651 m)  Wt 97 lb 11.2 oz (44.316 kg)  BMI 16.26 kg/m2  SpO2 86%       Objective:   Physical Exam  Constitutional: She is oriented to person, place, and time. She appears well-developed and well-nourished. No distress.  HENT:  Head: Normocephalic and atraumatic.  Right Ear: External ear normal.  Left Ear: External ear normal.  Nose: Nose normal.  Mouth/Throat: Oropharynx is clear and moist. No oropharyngeal exudate.  Neck: Normal range of motion. Neck supple.  Cardiovascular: Normal rate, regular rhythm, normal heart sounds and intact distal pulses.  Exam reveals no gallop and no friction rub.   No murmur heard. Pulmonary/Chest: She is in respiratory distress. She has wheezes.  Decreased breath sounds in bases.   Slight rhonchi in left lower.   Musculoskeletal: Normal range of motion.  Lymphadenopathy:    She has no cervical adenopathy.  Neurological: She is alert and oriented to person, place, and time.  Skin: Skin is warm and dry. No rash noted. She is not diaphoretic. No erythema. No pallor.  Psychiatric: She has a normal mood and affect. Her behavior is normal. Judgment and thought content normal.  Nursing note and vitals reviewed.      Assessment & Plan:  1. Respiratory distress - Started on 2 L [place] in office - ipratropium-albuterol (DUONEB) 0.5-2.5 (3) MG/3ML nebulizer solution 3 mL; Take 3 mLs  by nebulization once. - Needs further evaluation at ER to R/o PNA vs COPD exacerbation due to age and comorbididies - She is agreeable to this. She does not want to go by ambulance and will have her daughter take her.  - Oxygen saturation increased to 93% after duo-ned

## 2015-07-02 NOTE — Progress Notes (Addendum)
ANTIBIOTIC CONSULT NOTE - INITIAL  Pharmacy Consult for Ceftriaxone, Azithromycin Indication: CAP  Allergies  Allergen Reactions  . Penicillins     REACTION: unspecified    Patient Measurements: Height: 5\' 5"  (165.1 cm) Weight: 97 lb (43.999 kg) IBW/kg (Calculated) : 57  Vital Signs: Temp: 98.2 F (36.8 C) (07/15 1831) Temp Source: Oral (07/15 1831) BP: 121/53 mmHg (07/15 1827) Pulse Rate: 95 (07/15 1827) Intake/Output from previous day:   Intake/Output from this shift:    Labs: No results for input(s): WBC, HGB, PLT, LABCREA, CREATININE in the last 72 hours. CrCl cannot be calculated (Patient has no serum creatinine result on file.). No results for input(s): VANCOTROUGH, VANCOPEAK, VANCORANDOM, GENTTROUGH, GENTPEAK, GENTRANDOM, TOBRATROUGH, TOBRAPEAK, TOBRARND, AMIKACINPEAK, AMIKACINTROU, AMIKACIN in the last 72 hours.   Microbiology: No results found for this or any previous visit (from the past 720 hour(s)).  Medical History: Past Medical History  Diagnosis Date  . B12 DEFICIENCY 03/31/2008  . COPD 11/27/2008  . ESOPHAGEAL STRICTURE 01/29/2009  . MASS, LUNG 10/13/2009  . NASAL FRACTURE 08/12/2009  . OSTEOARTHRITIS 03/31/2008  . OSTEOPOROSIS 12/27/2007  . PEDAL EDEMA 12/27/2007  . PULMONARY NODULE 10/21/2009  . RESPIRATORY FAILURE, CHRONIC 06/29/2010    Medications:  Anti-infectives    Start     Dose/Rate Route Frequency Ordered Stop   07/02/15 1845  cefTRIAXone (ROCEPHIN) 1 g in dextrose 5 % 50 mL IVPB     1 g 100 mL/hr over 30 Minutes Intravenous  Once 07/02/15 1838     07/02/15 1845  azithromycin (ZITHROMAX) 500 mg in dextrose 5 % 250 mL IVPB     500 mg 250 mL/hr over 60 Minutes Intravenous  Once 07/02/15 1838        Assessment: 89 [facility] with cough, SOB, and possible pneumonia.  CXR concerning for RLL pneumonia.  Pharmacy is consulted to dose ceftriaxone and azithromycin for CAP.  Noted PCN allergy (rxn reported as rash and fever).   Rn verifies that she has had no itching, rash or adverse reactions.  7/15 >> ceftriaxone >> 7/15 >> azithromycin >>    Tmax: 99.3 WBC: 10.5 Renal function: SCr 0.54 with CrCl ~ 32 ml/min Lactic acid = 1.06 Blood cultures have been ordered.  Goal of Therapy:  Appropriate abx dosing, eradication of infection.   Plan:   Ceftriaxone 1g IV q24h  Azithromycin 500mg  IV q24h  Pharmacy to s/o note writing, but will f/u peripherally.  Lynann Beaverhristine Ledarius Leeson PharmD, BCPS Pager [phone] 07/02/2015 6:45 PM

## 2015-07-02 NOTE — H&P (Signed)
[facility] History and Physical  Sharon [name] RUE:[id number] DOB: 1923-10-03 DOA: 07/02/2015  Referring physician: Dr. Rennis Chris. PCP: Rogelia Boga, MD  Specialists: Dr. Arlyce Dice. Gastroenterologist.  Chief Complaint: Cough and shortness of breath.  HPI: Sharon [name] is a 81 y.o. female with history of COPD and esophageal stricture requiring dilation in 05/10/2015 presents to the ER because of nonproductive cough over the last 3 days with shortness of breath and subjective feeling of fever or chills. Patient had originally gone to her PCP and was referred to ER. Chest x-ray shows pneumonic process. Patient was initially tachycardic and febrile in the ER. Tachycardia improved with fluids. Patient has been admitted for pneumonia. Patient denies any chest pain nausea vomiting abdominal pain or diarrhea. On exam patient is not wheezing.  Review of Systems: As presented in the history of presenting illness, rest negative.  Past Medical History  Diagnosis Date  . B12 DEFICIENCY 03/31/2008  . COPD 11/27/2008  . ESOPHAGEAL STRICTURE 01/29/2009  . MASS, LUNG 10/13/2009  . NASAL FRACTURE 08/12/2009  . OSTEOARTHRITIS 03/31/2008  . OSTEOPOROSIS 12/27/2007  . PEDAL EDEMA 12/27/2007  . PULMONARY NODULE 10/21/2009  . RESPIRATORY FAILURE, CHRONIC 06/29/2010   Past Surgical History  Procedure Laterality Date  . Cataract extraction Bilateral     last '05  . Lumbar laminectomy    . Skin graf      '98  . Rectal prolapse repair      '06  . Esophagogastroduodenoscopy      with dilatation x3  . Appendectomy      '38  . Breast surgery      biopsy '47  . Cholecystectomy      laparoscopic'10  . Nasal reconstruction with septal repair      '10  . Esophagogastroduodenoscopy (egd) with propofol N/A 05/10/2015    Procedure: ESOPHAGOGASTRODUODENOSCOPY (EGD) WITH PROPOFOL;  Surgeon: Louis Meckel, MD;  Location: [facility];  Service: Endoscopy;  Laterality: N/A;  balloon dilation  .  Balloon dilation N/A 05/10/2015    Procedure: BALLOON DILATION;  Surgeon: Louis Meckel, MD;  Location: [facility];  Service: Endoscopy;  Laterality: N/A;   Social History:  reports that she quit smoking about 36 years ago. She has never used smokeless tobacco. She reports that she drinks alcohol. She reports that she does not use illicit drugs. Where does patient live home. Can patient participate in ADLs? Yes.  Allergies  Allergen Reactions  . Penicillins Rash    Also, fever.       Family History:  Family History  Problem Relation Age of Onset  . Hypertension Son       Prior to Admission medications   Medication Sig Start Date End Date Taking? Authorizing Provider  ALPRAZolam (XANAX) 0.25 MG tablet Take 1 tablet (0.25 mg total) by mouth 3 (three) times daily as needed. Patient taking differently: Take 0.25 mg by mouth 3 (three) times daily as needed for anxiety.  02/25/15  Yes Gordy Savers, MD  Calcium Carbonate-Vitamin D (CALTRATE 600+D) 600-400 MG-UNIT per chew tablet Chew 1 tablet by mouth daily.    Yes Historical Provider, MD  cyanocobalamin (,VITAMIN B-12,) 1000 MCG/ML injection INJECT 1 ML IN THE MUSCLE ONCE MONTHLY Patient taking differently: Inject 1 ml in the muscle once monthly. 11/11/14  Yes Gordy Savers, MD  escitalopram (LEXAPRO) 5 MG tablet TAKE 1 TABLET BY MOUTH EVERY DAY. Patient taking differently: Take 1 tablet by mouth every day. 02/26/15  Yes Gordy Savers, MD  mirtazapine (REMERON) 15 MG tablet Take 1 tablet (15 mg total) by mouth at bedtime. 02/15/15  Yes Gordy Savers, MD  Multiple Vitamins-Minerals (PRESERVISION/LUTEIN) CAPS Take 1 capsule by mouth daily.   Yes Historical Provider, MD  omeprazole (PRILOSEC) 20 MG capsule Take 1 capsule (20 mg total) by mouth daily. 02/15/15  Yes Gordy Savers, MD  Polyethyl Glycol-Propyl Glycol (SYSTANE OP) Place 1 drop into both eyes as needed (dry eyes.).   Yes Historical Provider, MD   SYMBICORT 160-4.5 MCG/ACT inhaler INHALE 2 PUFFS BY MOUTH TWICE DAILY Patient taking differently: Inhale 2 puffs by mouth twice daily. 02/26/15  Yes Gordy Savers, MD  HYDROcodone-acetaminophen (NORCO/VICODIN) 5-325 MG per tablet Take 1 tablet by mouth every 8 (eight) hours as needed for moderate pain. Patient not taking: Reported on 04/22/2015 02/15/15   Gordy Savers, MD  traMADol (ULTRAM) 50 MG tablet Take 1 tablet (50 mg total) by mouth every 8 (eight) hours as needed. Patient not taking: Reported on 07/02/2015 02/15/15   Gordy Savers, MD    Physical Exam: Filed Vitals:   07/02/15 1930 07/02/15 2000 07/02/15 2030 07/02/15 2148  BP: 131/48 142/52 118/52 145/62  Pulse: 77 75  87  Temp:    97.9 F (36.6 C)  TempSrc:    Oral  Resp: Height:     (1.651 m)  Weight:    43.863 kg (96 lb 11.2 oz)  SpO2: 100% 93%  96%     General:  Moderately built and poorly nourished.  Eyes: Anicteric no pallor.  ENT: No discharge from the ears eyes nose and mouth.  Neck: No mass felt.  Cardiovascular: S1 and S2 heard.  Respiratory: No rhonchi or crepitations.  Abdomen: Soft nontender bowel sounds present.  Skin: No rash.  Musculoskeletal: No edema.  Psychiatric: Appears normal.  Neurologic: Alert awake oriented to time place and person. Moves all extremities.  Labs on Admission:  Basic Metabolic Panel:  Recent Labs Lab 07/02/15 1838  NA 128*  K 4.0  CL 92*  CO2 25  GLUCOSE 105*  BUN 16  CREATININE 0.54  CALCIUM 8.9   Liver Function Tests: No results for input(s): AST, ALT, ALKPHOS, BILITOT, PROT, ALBUMIN in the last 168 hours. No results for input(s): LIPASE, AMYLASE in the last 168 hours. No results for input(s): AMMONIA in the last 168 hours. CBC:  Recent Labs Lab 07/02/15 1838  WBC 10.5  NEUTROABS 7.4  HGB 10.4*  HCT 31.8*  MCV 93.0  PLT 284   Cardiac Enzymes: No results for input(s): CKTOTAL, CKMB, CKMBINDEX, TROPONINI in  the last 168 hours.  BNP (last 3 results) No results for input(s): BNP in the last 8760 hours.  ProBNP (last 3 results) No results for input(s): PROBNP in the last 8760 hours.  CBG: No results for input(s): GLUCAP in the last 168 hours.  Radiological Exams on Admission: Dg Chest Portable 1 View  07/02/2015   CLINICAL DATA:  Cough, fever for 3 days.  COPD.  EXAM: PORTABLE CHEST - 1 VIEW  COMPARISON:  06/29/2010  FINDINGS: The lungs are hyperinflated likely secondary to COPD. There is bilateral diffuse interstitial thickening likely chronic. There is right lower lobe hazy airspace disease concerning for pneumonia. There is no pleural effusion or pneumothorax. The heart and mediastinal contours are unremarkable.  The osseous structures are unremarkable.  IMPRESSION: 1. Right lower lobe hazy airspace disease most concerning for pneumonia. Followup PA and lateral chest X-ray is  recommended in 3-4 weeks following trial of antibiotic therapy to ensure resolution and exclude underlying malignancy. 2. COPD.   Electronically Signed   By: Elige KoHetal  Patel   On: 07/02/2015 18:37     Assessment/Plan Principal Problem:   Sepsis Active Problems:   ESOPHAGEAL STRICTURE   Community acquired pneumonia   History of COPD   Normocytic normochromic anemia   Pneumonia   1. Sepsis secondary to Pneumonia - probably community-acquired but since patient also has his original stricture with difficulty swallowing and has a repeat esophageal dilation planned next month that is possible. Patient is aspirating. For now I have placed patient on ceftriaxone and Zithromax. Check blood cultures and sputum cultures. Check urine Legionella and strep antigen. I have requested a swallow evaluation until then patient will be on nectar thick. 2. Hyponatremia probably from dehydration - hydrate and follow metabolic panel. Check urine Legionella. 3. Esophageal stricture on PPI - check swallow evaluation. 4. COPD - presently not  wheezing continue inhalers. 5. Normocytic normochromic anemia - patient is on B-12 replacements. 6. Protein calorie malnutrition.  I have personally reviewed patient's chest x-ray and old charts.   DVT Prophylaxis Lovenox.  Code Status: Full code.  Family Communication: Discussed with patient's son.  Disposition Plan: Admit to inpatient.    Nasser Ku N. [facility] Pager [phone].  If 7PM-7AM, please contact night-coverage [email or web address] Password [email or web address] 07/02/2015, 10:34 PM

## 2015-07-02 NOTE — ED Notes (Signed)
Pt presents with c/o cough that started approx 3 days ago. Pt went to see her doctor and was told to come here evaluation of possible pneumonia. Pt's visitor at bedside report she was given a nebulizer treatment at the office. Pt came by private vehicle, arrives with O2 sats of 85% on RA. Pt placed on 2L of O2 in triage.

## 2015-07-02 NOTE — ED Notes (Signed)
Will draw i-stat lactic acid first prior to transferf to floor unit.

## 2015-07-02 NOTE — Patient Instructions (Signed)
Please go to the ER for further evaluation °

## 2015-07-03 DIAGNOSIS — D649 Anemia, unspecified: Secondary | ICD-10-CM

## 2015-07-03 DIAGNOSIS — Z8709 Personal history of other diseases of the respiratory system: Secondary | ICD-10-CM

## 2015-07-03 DIAGNOSIS — A419 Sepsis, unspecified organism: Principal | ICD-10-CM

## 2015-07-03 DIAGNOSIS — J189 Pneumonia, unspecified organism: Secondary | ICD-10-CM

## 2015-07-03 LAB — EXPECTORATED SPUTUM ASSESSMENT W GRAM STAIN, RFLX TO RESP C

## 2015-07-03 LAB — CBC WITH DIFFERENTIAL/PLATELET
Basophils Absolute: 0 10*3/uL (ref 0.0–0.1)
Basophils Relative: 0 % (ref 0–1)
EOS ABS: 0.1 10*3/uL (ref 0.0–0.7)
Eosinophils Relative: 1 % (ref 0–5)
HEMATOCRIT: 29.1 % — AB (ref 36.0–46.0)
Hemoglobin: 9.7 g/dL — ABNORMAL LOW (ref 12.0–15.0)
LYMPHS ABS: 1.3 10*3/uL (ref 0.7–4.0)
LYMPHS PCT: 14 % (ref 12–46)
MCH: 30.4 pg (ref 26.0–34.0)
MCHC: 33.3 g/dL (ref 30.0–36.0)
MCV: 91.2 fL (ref 78.0–100.0)
MONO ABS: 1.6 10*3/uL — AB (ref 0.1–1.0)
Monocytes Relative: 16 % — ABNORMAL HIGH (ref 3–12)
Neutro Abs: 6.7 10*3/uL (ref 1.7–7.7)
Neutrophils Relative %: 69 % (ref 43–77)
PLATELETS: 268 10*3/uL (ref 150–400)
RBC: 3.19 MIL/uL — AB (ref 3.87–5.11)
RDW: 13.3 % (ref 11.5–15.5)
WBC: 9.7 10*3/uL (ref 4.0–10.5)

## 2015-07-03 LAB — COMPREHENSIVE METABOLIC PANEL
ALT: 16 U/L (ref 14–54)
ANION GAP: 7 (ref 5–15)
AST: 18 U/L (ref 15–41)
Albumin: 2.7 g/dL — ABNORMAL LOW (ref 3.5–5.0)
Alkaline Phosphatase: 54 U/L (ref 38–126)
BILIRUBIN TOTAL: 0.5 mg/dL (ref 0.3–1.2)
BUN: 11 mg/dL (ref 6–20)
CO2: 25 mmol/L (ref 22–32)
CREATININE: 0.45 mg/dL (ref 0.44–1.00)
Calcium: 7.9 mg/dL — ABNORMAL LOW (ref 8.9–10.3)
Chloride: 99 mmol/L — ABNORMAL LOW (ref 101–111)
Glucose, Bld: 103 mg/dL — ABNORMAL HIGH (ref 65–99)
Potassium: 3.9 mmol/L (ref 3.5–5.1)
Sodium: 131 mmol/L — ABNORMAL LOW (ref 135–145)
TOTAL PROTEIN: 5.4 g/dL — AB (ref 6.5–8.1)

## 2015-07-03 LAB — EXPECTORATED SPUTUM ASSESSMENT W REFEX TO RESP CULTURE

## 2015-07-03 MED ORDER — BUDESONIDE 0.25 MG/2ML IN SUSP
0.2500 mg | Freq: Two times a day (BID) | RESPIRATORY_TRACT | Status: DC
  Administered 2015-07-03 – 2015-07-04 (×2): 0.25 mg via RESPIRATORY_TRACT
  Filled 2015-07-03 (×2): qty 2

## 2015-07-03 MED ORDER — IPRATROPIUM-ALBUTEROL 0.5-2.5 (3) MG/3ML IN SOLN
3.0000 mL | Freq: Four times a day (QID) | RESPIRATORY_TRACT | Status: DC
  Administered 2015-07-03 (×2): 3 mL via RESPIRATORY_TRACT
  Filled 2015-07-03 (×2): qty 3

## 2015-07-03 MED ORDER — ARFORMOTEROL TARTRATE 15 MCG/2ML IN NEBU
15.0000 ug | INHALATION_SOLUTION | Freq: Two times a day (BID) | RESPIRATORY_TRACT | Status: DC
  Administered 2015-07-03 – 2015-07-04 (×2): 15 ug via RESPIRATORY_TRACT
  Filled 2015-07-03 (×4): qty 2

## 2015-07-03 MED ORDER — ENSURE ENLIVE PO LIQD
237.0000 mL | Freq: Two times a day (BID) | ORAL | Status: DC
  Administered 2015-07-03: 237 mL via ORAL

## 2015-07-03 MED ORDER — METHYLPREDNISOLONE SODIUM SUCC 125 MG IJ SOLR
60.0000 mg | Freq: Two times a day (BID) | INTRAMUSCULAR | Status: DC
  Administered 2015-07-03 – 2015-07-04 (×3): 60 mg via INTRAVENOUS
  Filled 2015-07-03 (×4): qty 0.96

## 2015-07-03 MED ORDER — METRONIDAZOLE IN NACL 5-0.79 MG/ML-% IV SOLN
500.0000 mg | Freq: Three times a day (TID) | INTRAVENOUS | Status: DC
  Administered 2015-07-03 (×2): 500 mg via INTRAVENOUS
  Filled 2015-07-03 (×2): qty 100

## 2015-07-03 NOTE — Progress Notes (Signed)
[facility] PROGRESS NOTE  Sharon AngerJoanne E Gougeon WUJ:[id number] DOB: 1923/04/18 DOA: 07/02/2015 PCP: Rogelia BogaKWIATKOWSKI,PETER FRANK, MD  Brief Summary  Sharon AngerJoanne E [name] is a 81 y.o. female with history of COPD and esophageal stricture requiring dilation in 05/10/2015 presents to the ER because of nonproductive cough over the last 3 days with shortness of breath and subjective feeling of fever or chills. Patient had originally gone to her PCP and was referred to ER. Chest x-ray shows pneumonic process. Patient was initially tachycardic and febrile in the ER. Tachycardia improved with fluids. Patient has been admitted for pneumonia. Patient denies any chest pain nausea vomiting abdominal pain or diarrhea. On exam patient is not wheezing.  Assessment/Plan  Acute hypoxic respiratory failure secondary to acute COPD exacerbation and community-acquired pneumonia. Per her history she does not have any recent episodes of aspiration or choking. She has a follow-up appointment soon with her gastroenterologist Dr. Arlyce DiceKaplan. -   DC speech therapy evaluation -   DC  Esophagram -   Continue ceftriaxone and azithromycin -   Start steroids, DuoNeb's -   Start nebulized budesonide and brovana and d/c symbicort -   Continue cough medications as needed -   Blood cultures no growth to date -   Follow-up Legionella and strep pneumo antigen  Hyponatremia secondary to dehydration, resolving with IV fluids   Esophageal stricture, stable, on PPI , no recent swallowing problems except for pills which is chronic. No recent choking episodes.   Normocytic, chronic anemia likely secondary to vitamin B12 deficiency , stable, continue vitamin B12 repletion   Protein calorie malnutrition, nutrition consult, regular diet, supplements  Diet:  regular Access:  PIV IVF:  off Proph:  lovenox  Code Status: full Family Communication: patient and her son Disposition Plan: pending further improvement in breathing.      Consultants:  None  Procedures:  CXR  Antibiotics:  Ceftriaxone and azithromycin 7/15 >   HPI/Subjective:  Feeling somewhat better but still tight in chest, SOB  Objective: Filed Vitals:   07/02/15 2030 07/02/15 2148 07/03/15 0542 07/03/15 0921  BP: 118/52 145/62 127/59   Pulse:  87 74   Temp:  97.9 F (36.6 C) 98.2 F (36.8 C)   TempSrc:  Oral Oral   Resp: 18 22 18    Height:  5\' 5"  (1.651 m)    Weight:  43.863 kg (96 lb 11.2 oz)    SpO2:  96% 96% 97%    Intake/Output Summary (Last 24 hours) at 07/03/15 1214 Last data filed at 07/03/15 0959  Gross per 24 hour  Intake 2065.4 ml  Output    400 ml  Net 1665.4 ml   Filed Weights   07/02/15 1842 07/02/15 2148  Weight: 43.999 kg (97 lb) 43.863 kg (96 lb 11.2 oz)   Body mass index is 16.09 kg/(m^2).  Exam:   General:   Cachectic female,No acute distress  , nasal cannula in place, mild tachypnea with accessory muscle use of the SCM  HEENT:  NCAT, MMM  Cardiovascular:  RRR, nl S1, S2 no mrg, 2+ pulses, warm extremities  Respiratory:   Diminished bilateral breath sounds with rales intercourse and very diminished breath sounds at the right base , wheezy cough, no rhonchi  Abdomen:   NABS, soft, NT/ND  MSK:   Normal tone and bulk, no LEE  Neuro:  Grossly intact  Data Reviewed: Basic Metabolic Panel:  Recent Labs Lab 07/02/15 1838 07/03/15 0600  NA 128* 131*  K 4.0 3.9  CL 92*  99*  CO2 25 25  GLUCOSE 105* 103*  BUN 16 11  CREATININE 0.54 0.45  CALCIUM 8.9 7.9*   Liver Function Tests:  Recent Labs Lab 07/03/15 0600  AST 18  ALT 16  ALKPHOS 54  BILITOT 0.5  PROT 5.4*  ALBUMIN 2.7*   No results for input(s): LIPASE, AMYLASE in the last 168 hours. No results for input(s): AMMONIA in the last 168 hours. CBC:  Recent Labs Lab 07/02/15 1838 07/03/15 0600  WBC 10.5 9.7  NEUTROABS 7.4 6.7  HGB 10.4* 9.7*  HCT 31.8* 29.1*  MCV 93.0 91.2  PLT 284 268    No results found for this  or any previous visit (from the past 240 hour(s)).   Studies: Dg Chest Portable 1 View  07/02/2015   CLINICAL DATA:  Cough, fever for 3 days.  COPD.  EXAM: PORTABLE CHEST - 1 VIEW  COMPARISON:  06/29/2010  FINDINGS: The lungs are hyperinflated likely secondary to COPD. There is bilateral diffuse interstitial thickening likely chronic. There is right lower lobe hazy airspace disease concerning for pneumonia. There is no pleural effusion or pneumothorax. The heart and mediastinal contours are unremarkable.  The osseous structures are unremarkable.  IMPRESSION: 1. Right lower lobe hazy airspace disease most concerning for pneumonia. Followup PA and lateral chest X-ray is recommended in 3-4 weeks following trial of antibiotic therapy to ensure resolution and exclude underlying malignancy. 2. COPD.   Electronically Signed   By: Elige Ko   On: 07/02/2015 18:37    Scheduled Meds: . azithromycin  500 mg Intravenous Q24H  . budesonide-formoterol  2 puff Inhalation BID  . cefTRIAXone (ROCEPHIN)  IV  1 g Intravenous Q24H  . enoxaparin (LOVENOX) injection  30 mg Subcutaneous QHS  . escitalopram  5 mg Oral Daily  . metronidazole  500 mg Intravenous Q8H  . mirtazapine  15 mg Oral QHS  . pantoprazole  40 mg Oral Daily   Continuous Infusions: . sodium chloride 50 mL/hr at 07/02/15 2256    Principal Problem:   Sepsis Active Problems:   ESOPHAGEAL STRICTURE   Community acquired pneumonia   History of COPD   Normocytic normochromic anemia   Pneumonia    Time spent: 30 min    Cheril Slattery  [facility] Pager [phone]. If 7PM-7AM, please contact night-coverage at [email or web address], password [facility] 07/03/2015, 12:14 PM  LOS: 1 day

## 2015-07-03 NOTE — Progress Notes (Signed)
SLP Cancellation Note  Patient Details Name: Sharon [name] MRN: [id number] DOB: 1923-09-05   Cancelled treatment:       Reason Eval/Treat Not Completed: Other (comment) (Order cancelled when SLP arrived to complete BSE per nursing)   ADAMS,PAT, M.S., CCC-SLP 07/03/2015, 2:48 PM

## 2015-07-04 DIAGNOSIS — J439 Emphysema, unspecified: Secondary | ICD-10-CM

## 2015-07-04 DIAGNOSIS — E871 Hypo-osmolality and hyponatremia: Secondary | ICD-10-CM

## 2015-07-04 DIAGNOSIS — J9601 Acute respiratory failure with hypoxia: Secondary | ICD-10-CM

## 2015-07-04 LAB — STREP PNEUMONIAE URINARY ANTIGEN: Strep Pneumo Urinary Antigen: NEGATIVE

## 2015-07-04 MED ORDER — DOXYCYCLINE HYCLATE 100 MG PO TABS: 100.0000 mg | ORAL_TABLET | Freq: Two times a day (BID) | ORAL | Status: DC

## 2015-07-04 MED ORDER — PREDNISONE 50 MG PO TABS: 50.0000 mg | ORAL_TABLET | Freq: Every day | ORAL | Status: DC

## 2015-07-04 MED ORDER — IPRATROPIUM-ALBUTEROL 20-100 MCG/ACT IN AERS: 1.0000 | INHALATION_SPRAY | Freq: Four times a day (QID) | RESPIRATORY_TRACT | Status: DC

## 2015-07-04 MED ORDER — IPRATROPIUM-ALBUTEROL 0.5-2.5 (3) MG/3ML IN SOLN
3.0000 mL | Freq: Three times a day (TID) | RESPIRATORY_TRACT | Status: DC
  Administered 2015-07-04: 3 mL via RESPIRATORY_TRACT
  Filled 2015-07-04: qty 3

## 2015-07-04 MED ORDER — AZITHROMYCIN 500 MG PO TABS
500.0000 mg | ORAL_TABLET | ORAL | Status: DC
  Filled 2015-07-04: qty 1

## 2015-07-04 NOTE — Progress Notes (Signed)
Ambulated pt on room air, oxygen saturations stayed around 90-91%, but did drop to 89%. Pt asymptomatic, Dr. Malachi BondsShort made aware. Will continue to monitor pt.

## 2015-07-04 NOTE — Progress Notes (Signed)
PHARMACIST - PHYSICIAN COMMUNICATION DR:   Malachi BondsShort CONCERNING: Antibiotic IV to Oral Route Change Policy  RECOMMENDATION: This patient is receiving azithromycin by the intravenous route.  Based on criteria approved by the Pharmacy and Therapeutics Committee, the antibiotic(s) is/are being converted to the equivalent oral dose form(s).   DESCRIPTION: These criteria include:  Patient being treated for a respiratory tract infection, urinary tract infection, cellulitis or clostridium difficile associated diarrhea if on metronidazole  The patient is not neutropenic and does not exhibit a GI malabsorption state  The patient is eating (either orally or via tube) and/or has been taking other orally administered medications for a least 24 hours  The patient is improving clinically and has a Tmax < 100.5  If you have questions about this conversion, please contact the Pharmacy Department  []   [phone]( [phone] )  [facility] []   [phone]( [phone] )  [facility] []   [phone]( [phone] )  [facility] []   [phone]( [phone] )  [facility] [x]   [phone]( [phone] )  [facility]   Dorna LeitzAnh Gokul Waybright, PharmD, BCPS 07/04/2015 11:10 AM

## 2015-07-04 NOTE — Care Management Note (Addendum)
Case Management Note  Patient Details  Name: Sharon [name] MRN: [id number] Date of Birth: March 16, 1923  Subjective/Objective:        PNA, COPD            Action/Plan: Home  Expected Discharge Date:  07/04/15               Expected Discharge Plan:  Home/Self Care  In-House Referral:     Discharge planning Services  CM Consult  Post Acute Care Choice:    Choice offered to:     DME Arranged:    DME Agency:     HH Arranged:    HH Agency:     Status of Service:  Completed, signed off  Medicare Important Message Given:    Date Medicare IM Given:    Medicare IM give by:    Date Additional Medicare IM Given:    Additional Medicare Important Message give by:     If discussed at Long Length of Stay Meetings, dates discussed:    Additional Comments: Unit RN checked walking sat and pt dropped to 89% while ambulating. MD aware. Pt to follow up with PCP in 1-2 weeks.  Elliot CousinShavis, Gwendola Hornaday [name], RN 07/04/2015, 11:42 AM

## 2015-07-04 NOTE — Progress Notes (Signed)
Pt discharged home with belongings, IVs removed, AVS given and teach back performed. Pt wheeled down in wheelchair by nurse aide with son and daughter in law, pt stable at time of discharge.

## 2015-07-04 NOTE — Discharge Summary (Signed)
Physician Discharge Summary  Sharon [name]:[id number] DOB: 01-15-1923 DOA: 07/02/2015  PCP: Rogelia Boga, MD  Admit date: 07/02/2015 Discharge date: 07/04/2015  Recommendations for Outpatient Follow-up:  1. F/u with primary care doctor for follow up of COPD and pneumonia, anemia and hyponatremia.  Repeat CBC and BMP at next visit. 2. F/u with gastroenterology for routine follow up of esophageal stricture  Discharge Diagnoses:  Principal Problem:   Sepsis Active Problems:   ESOPHAGEAL STRICTURE   Community acquired pneumonia   COPD (chronic obstructive pulmonary disease) with emphysema   Normocytic normochromic anemia   CAP (community acquired pneumonia)   Acute respiratory failure with hypoxia   Hyponatremia   Discharge Condition: stable, improved  Diet recommendation: regular with supplements  Wt Readings from Last 3 Encounters:  07/02/15 43.863 kg (96 lb 11.2 oz)  07/02/15 44.316 kg (97 lb 11.2 oz)  05/10/15 42.638 kg (94 lb)    History of present illness:  Sharon [name] is a 81 y.o. female with history of COPD and esophageal stricture requiring dilation in 05/10/2015 presents to the ER because of nonproductive cough over the last 3 days with shortness of breath and subjective feeling of fever or chills. Patient had originally gone to her PCP and was referred to ER. Chest x-ray shows pneumonic process. Patient was initially tachycardic and febrile in the ER qualifying for sepsis. Tachycardia improved with fluids. Patient was admitted for pneumonia.  Hospital Course:   Acute hypoxic respiratory failure and sepsis secondary to acute COPD exacerbation and community-acquired pneumonia.  Initially, there was concern for possible aspiration pneumonia given her history of esophageal stricture.  The patient, however, states that she really has no problems eating or drinking and only has problems with large pills.  Per her history she has not had any recent episodes of  aspiration or choking.  She has a follow-up appointment soon with her gastroenterologist Dr. Arlyce Dice.  She was treated for community acquired pneumonia and transitioned to doxycycline at discharge.  Additionally, she had very diminished breath sounds with wheezy cough so she was also treated for acute COPD exacerbation with steroids and duonebs.  She was discharged with additional prednisone and a combivent inhaler.  She was able to ambulate maintaining O2 sat 89-92% prior to discharge and her fever and tachycardia had resolved.  She should follow up with her primary care doctor in 1-2 weeks for reevaluation.  Her blood cultures are NGTD and S. pneumo neg.  Legionella pending.   Hyponatremia secondary to dehydration, resolving with IV fluids  Esophageal stricture, stable, on PPI , no recent swallowing problems except for pills which is chronic. No recent choking episodes.  Normocytic, chronic anemia likely secondary to vitamin B12 deficiency, stable, continue vitamin B12 repletion  Protein calorie malnutrition, nutrition consult, regular diet, supplements  Consultants:  None  Procedures:  CXR  Antibiotics:  Ceftriaxone and azithromycin 7/15 >  Discharge Exam: Filed Vitals:   07/04/15 0532  BP: 102/50  Pulse: 69  Temp: 97.1 F (36.2 C)  Resp: 20   Filed Vitals:   07/04/15 0747 07/04/15 0845 07/04/15 0900 07/04/15 0902  BP:      Pulse:      Temp:      TempSrc:      Resp:      Height:      Weight:      SpO2: 98% 98% 89% 93%     General: Cachectic female, No acute distress  HEENT: NCAT, MMM  Cardiovascular: RRR, nl  S1, S2 no mrg, 2+ pulses, warm extremities  Respiratory: Diminished bilateral breath sounds, more diminished at the RLL. No wheezing or rhonchi  Abdomen: NABS, soft, NT/ND  MSK: Normal tone and bulk, no LEE  Neuro: Grossly intact  Discharge Instructions      Discharge Instructions    Call MD for:  difficulty breathing, headache or visual  disturbances    Complete by:  As directed      Call MD for:  extreme fatigue    Complete by:  As directed      Call MD for:  hives    Complete by:  As directed      Call MD for:  persistant dizziness or light-headedness    Complete by:  As directed      Call MD for:  persistant nausea and vomiting    Complete by:  As directed      Call MD for:  severe uncontrolled pain    Complete by:  As directed      Call MD for:  temperature >100.4    Complete by:  As directed      Diet - low sodium heart healthy    Complete by:  As directed      Discharge instructions    Complete by:  As directed   You were hospitalized with pneumonia.  Please take doxycycline, an antibiotic. Your next dose is due this evening and the last day on the 21st.  Please use combivent four times a day until your breathing is better and you are able to follow up with your primary care doctor.  Continue prednisone, your next dose is due tomorrow.     Increase activity slowly    Complete by:  As directed             Medication List    STOP taking these medications        traMADol 50 MG tablet  Commonly known as:  ULTRAM      TAKE these medications        ALPRAZolam 0.25 MG tablet  Commonly known as:  XANAX  Take 1 tablet (0.25 mg total) by mouth 3 (three) times daily as needed.     CALTRATE 600+D 600-400 MG-UNIT per chew tablet  Generic drug:  Calcium Carbonate-Vitamin D  Chew 1 tablet by mouth daily.     cyanocobalamin 1000 MCG/ML injection  Commonly known as:  (VITAMIN B-12)  INJECT 1 ML IN THE MUSCLE ONCE MONTHLY     doxycycline 100 MG tablet  Commonly known as:  VIBRA-TABS  Take 1 tablet (100 mg total) by mouth 2 (two) times daily.     escitalopram 5 MG tablet  Commonly known as:  LEXAPRO  TAKE 1 TABLET BY MOUTH EVERY DAY.     HYDROcodone-acetaminophen 5-325 MG per tablet  Commonly known as:  NORCO/VICODIN  Take 1 tablet by mouth every 8 (eight) hours as needed for moderate pain.      Ipratropium-Albuterol 20-100 MCG/ACT Aers respimat  Commonly known as:  COMBIVENT  Inhale 1 puff into the lungs every 6 (six) hours.     mirtazapine 15 MG tablet  Commonly known as:  REMERON  Take 1 tablet (15 mg total) by mouth at bedtime.     omeprazole 20 MG capsule  Commonly known as:  PRILOSEC  Take 1 capsule (20 mg total) by mouth daily.     predniSONE 50 MG tablet  Commonly known as:  DELTASONE  Take 1 tablet (  50 mg total) by mouth daily with breakfast.     PRESERVISION/LUTEIN Caps  Take 1 capsule by mouth daily.     SYMBICORT 160-4.5 MCG/ACT inhaler  Generic drug:  budesonide-formoterol  INHALE 2 PUFFS BY MOUTH TWICE DAILY     SYSTANE OP  Place 1 drop into both eyes as needed (dry eyes.).       Follow-up Information    Follow up with Rogelia Boga, MD. Schedule an appointment as soon as possible for a visit in 2 weeks.   Specialty:  Internal Medicine   Contact information:   [address] [place] [address] [phone]        The results of significant diagnostics from this hospitalization (including imaging, microbiology, ancillary and laboratory) are listed below for reference.    Significant Diagnostic Studies: Dg Chest Portable 1 View  07/02/2015   CLINICAL DATA:  Cough, fever for 3 days.  COPD.  EXAM: PORTABLE CHEST - 1 VIEW  COMPARISON:  06/29/2010  FINDINGS: The lungs are hyperinflated likely secondary to COPD. There is bilateral diffuse interstitial thickening likely chronic. There is right lower lobe hazy airspace disease concerning for pneumonia. There is no pleural effusion or pneumothorax. The heart and mediastinal contours are unremarkable.  The osseous structures are unremarkable.  IMPRESSION: 1. Right lower lobe hazy airspace disease most concerning for pneumonia. Followup PA and lateral chest X-ray is recommended in 3-4 weeks following trial of antibiotic therapy to ensure resolution and exclude underlying malignancy. 2. COPD.    Electronically Signed   By: Elige Ko   On: 07/02/2015 18:37    Microbiology: Recent Results (from the past 240 hour(s))  Blood Culture (routine x 2)     Status: None (Preliminary result)   Collection Time: 07/02/15  6:30 PM  Result Value Ref Range Status   Specimen Description BLOOD LEFT ANTECUBITAL  Final   Special Requests BOTTLES DRAWN AEROBIC AND ANAEROBIC 5CC  Final   Culture   Final    NO GROWTH < 24 HOURS Performed at [facility]    Report Status PENDING  Incomplete  Blood Culture (routine x 2)     Status: None (Preliminary result)   Collection Time: 07/02/15  6:42 PM  Result Value Ref Range Status   Specimen Description BLOOD RIGHT ANTECUBITAL  Final   Special Requests BOTTLES DRAWN AEROBIC AND ANAEROBIC  Final   Culture   Final    NO GROWTH < 24 HOURS Performed at [facility]    Report Status PENDING  Incomplete  Culture, sputum-assessment     Status: None   Collection Time: 07/03/15  1:20 PM  Result Value Ref Range Status   Specimen Description SPUTUM  Final   Special Requests NONE  Final   Sputum evaluation   Final    THIS SPECIMEN IS ACCEPTABLE. RESPIRATORY CULTURE REPORT TO FOLLOW.   Report Status 07/03/2015 FINAL  Final  Culture, respiratory (NON-Expectorated)     Status: None (Preliminary result)   Collection Time: 07/03/15  1:20 PM  Result Value Ref Range Status   Specimen Description SPUTUM  Final   Special Requests NONE  Final   Gram Stain PENDING  Incomplete   Culture   Final    Culture reincubated for better growth Performed at [facility]    Report Status PENDING  Incomplete     Labs: Basic Metabolic Panel:  Recent Labs Lab 07/02/15 1838 07/03/15 0600  NA 128* 131*  K 4.0 3.9  CL 92* 99*  CO2 25 25  GLUCOSE 105* 103*  BUN 16 11  CREATININE 0.54 0.45  CALCIUM 8.9 7.9*   Liver Function Tests:  Recent Labs Lab 07/03/15 0600  AST 18  ALT 16  ALKPHOS 54  BILITOT 0.5  PROT 5.4*  ALBUMIN 2.7*    No results for input(s): LIPASE, AMYLASE in the last 168 hours. No results for input(s): AMMONIA in the last 168 hours. CBC:  Recent Labs Lab 07/02/15 1838 07/03/15 0600  WBC 10.5 9.7  NEUTROABS 7.4 6.7  HGB 10.4* 9.7*  HCT 31.8* 29.1*  MCV 93.0 91.2  PLT 284 268   Cardiac Enzymes: No results for input(s): CKTOTAL, CKMB, CKMBINDEX, TROPONINI in the last 168 hours. BNP: BNP (last 3 results) No results for input(s): BNP in the last 8760 hours.  ProBNP (last 3 results) No results for input(s): PROBNP in the last 8760 hours.  CBG: No results for input(s): GLUCAP in the last 168 hours.  Time coordinating discharge: 35 minutes  Signed:  Kairos Panetta  [facility] 07/04/2015, 11:32 AM

## 2015-07-04 NOTE — Progress Notes (Signed)
Utilization review completed.  

## 2015-07-05 LAB — LEGIONELLA ANTIGEN, URINE

## 2015-07-06 LAB — CULTURE, RESPIRATORY

## 2015-07-06 LAB — CULTURE, RESPIRATORY W GRAM STAIN: Culture: NORMAL

## 2015-07-07 LAB — CULTURE, BLOOD (ROUTINE X 2)
CULTURE: NO GROWTH
Culture: NO GROWTH

## 2015-07-09 ENCOUNTER — Encounter: Payer: Self-pay | Admitting: Internal Medicine

## 2015-07-09 ENCOUNTER — Ambulatory Visit (INDEPENDENT_AMBULATORY_CARE_PROVIDER_SITE_OTHER): Payer: Federal, State, Local not specified - PPO | Admitting: Internal Medicine

## 2015-07-09 VITALS — BP 110/70 | HR 67 | Temp 98.5°F | Ht 65.0 in | Wt 98.0 lb

## 2015-07-09 DIAGNOSIS — J9601 Acute respiratory failure with hypoxia: Secondary | ICD-10-CM | POA: Diagnosis not present

## 2015-07-09 DIAGNOSIS — A419 Sepsis, unspecified organism: Secondary | ICD-10-CM

## 2015-07-09 DIAGNOSIS — J189 Pneumonia, unspecified organism: Secondary | ICD-10-CM | POA: Diagnosis not present

## 2015-07-09 DIAGNOSIS — D649 Anemia, unspecified: Secondary | ICD-10-CM

## 2015-07-09 NOTE — Patient Instructions (Signed)
Limit your sodium (Salt) intake  Return in 6 months for follow-up  

## 2015-07-09 NOTE — Progress Notes (Signed)
Pre visit review using our clinic review tool, if applicable. No additional management support is needed unless otherwise documented below in the visit note. 

## 2015-07-09 NOTE — Progress Notes (Signed)
Subjective:    Patient ID: Sharon [name], female    DOB: May 15, 1923, 81 y.o.   MRN: [id number]  HPI  Admit date: 07/02/2015 Discharge date: 07/04/2015  Recommendations for Outpatient Follow-up:  1. F/u with primary care doctor for follow up of COPD and pneumonia, anemia and hyponatremia. Repeat CBC and BMP at next visit. 2. F/u with gastroenterology for routine follow up of esophageal stricture  Discharge Diagnoses:  Principal Problem:  Sepsis Active Problems:  ESOPHAGEAL STRICTURE  Community acquired pneumonia  COPD (chronic obstructive pulmonary disease) with emphysema  Normocytic normochromic anemia  CAP (community acquired pneumonia)  Acute respiratory failure with hypoxia  Hyponatremia  81 year old patient who is seen following a recent hospital discharge for community acquired pneumonia.  She had a fairly uneventful 3 days in the hospital and was treated with Rocephin and a Zithromax.  Doing quite well.  Has mild residual nonproductive cough that is improving, but otherwise is close to baseline. She was discharged on albuterol as well as her maintenance Symbicort No shortness of breath  Hospital records reviewed  Past Medical History  Diagnosis Date  . B12 DEFICIENCY 03/31/2008  . COPD 11/27/2008  . ESOPHAGEAL STRICTURE 01/29/2009  . MASS, LUNG 10/13/2009  . NASAL FRACTURE 08/12/2009  . OSTEOARTHRITIS 03/31/2008  . OSTEOPOROSIS 12/27/2007  . PEDAL EDEMA 12/27/2007  . PULMONARY NODULE 10/21/2009  . RESPIRATORY FAILURE, CHRONIC 06/29/2010    History   Social History  . Marital Status: Widowed    Spouse Name: N/A  . Number of Children: N/A  . Years of Education: N/A   Occupational History  . Not on file.   Social History Main Topics  . Smoking status: Former Smoker    Quit date: 12/18/1978  . Smokeless tobacco: Never Used  . Alcohol Use: 0.0 oz/week    0 Standard drinks or equivalent per week     Comment: couple of gin and tonics daily  . Drug Use:  No  . Sexual Activity: Not on file   Other Topics Concern  . Not on file   Social History Narrative    Past Surgical History  Procedure Laterality Date  . Cataract extraction Bilateral     last '05  . Lumbar laminectomy    . Skin graf      '98  . Rectal prolapse repair      '06  . Esophagogastroduodenoscopy      with dilatation x3  . Appendectomy      '38  . Breast surgery      biopsy '47  . Cholecystectomy      laparoscopic'10  . Nasal reconstruction with septal repair      '10  . Esophagogastroduodenoscopy (egd) with propofol N/A 05/10/2015    Procedure: ESOPHAGOGASTRODUODENOSCOPY (EGD) WITH PROPOFOL;  Surgeon: Louis Meckel, MD;  Location: [facility] END[facility]COPY;  Service: Endoscopy;  Laterality: N/A;  balloon dilation  . Balloon dilation N/A 05/10/2015    Procedure: BALLOON DILATION;  Surgeon: Louis Meckel, MD;  Location: [facility] END[facility]COPY;  Service: Endoscopy;  Laterality: N/A;    Family History  Problem Relation Age of Onset  . Hypertension Son     Allergies  Allergen Reactions  . Penicillins Rash    Also, fever.       Current Outpatient Prescriptions on File Prior to Visit  Medication Sig Dispense Refill  . ALPRAZolam (XANAX) 0.25 MG tablet Take 1 tablet (0.25 mg total) by mouth 3 (three) times daily as needed. (Patient taking differently: Take 0.25  mg by mouth 3 (three) times daily as needed for anxiety. ) 90 tablet 0  . Calcium Carbonate-Vitamin D (CALTRATE 600+D) 600-400 MG-UNIT per chew tablet Chew 1 tablet by mouth daily.     . cyanocobalamin (,VITAMIN B-12,) 1000 MCG/ML injection INJECT 1 ML IN THE MUSCLE ONCE MONTHLY (Patient taking differently: Inject 1 ml in the muscle once monthly.) 10 mL 0  . escitalopram (LEXAPRO) 5 MG tablet TAKE 1 TABLET BY MOUTH EVERY DAY. (Patient taking differently: Take 1 tablet by mouth every day.) 90 tablet 1  . Ipratropium-Albuterol (COMBIVENT) 20-100 MCG/ACT AERS respimat Inhale 1 puff into the lungs every 6 (six) hours. 1 Inhaler 0   . mirtazapine (REMERON) 15 MG tablet Take 1 tablet (15 mg total) by mouth at bedtime. 90 tablet 2  . Multiple Vitamins-Minerals (PRESERVISION/LUTEIN) CAPS Take 1 capsule by mouth daily.    Marland Kitchen omeprazole (PRILOSEC) 20 MG capsule Take 1 capsule (20 mg total) by mouth daily. 30 capsule 3  . Polyethyl Glycol-Propyl Glycol (SYSTANE OP) Place 1 drop into both eyes as needed (dry eyes.).    Marland Kitchen SYMBICORT 160-4.5 MCG/ACT inhaler INHALE 2 PUFFS BY MOUTH TWICE DAILY (Patient taking differently: Inhale 2 puffs by mouth twice daily.) 10.2 g 5  . HYDROcodone-acetaminophen (NORCO/VICODIN) 5-325 MG per tablet Take 1 tablet by mouth every 8 (eight) hours as needed for moderate pain. (Patient not taking: Reported on 04/22/2015) 40 tablet 0   No current facility-administered medications on file prior to visit.    BP 110/70 mmHg  Pulse 67  Temp(Src) 98.5 F (36.9 C) (Oral)  Ht  (1.651 m)  Wt 98 lb (44.453 kg)  BMI 16.31 kg/m2  SpO2 95%      Review of Systems  HENT: Negative for congestion, dental problem, hearing loss, rhinorrhea, sinus pressure, sore throat and tinnitus.   Eyes: Negative for pain, discharge and visual disturbance.  Respiratory: Positive for cough. Negative for shortness of breath.   Cardiovascular: Negative for chest pain, palpitations and leg swelling.  Gastrointestinal: Negative for nausea, vomiting, abdominal pain, diarrhea, constipation, blood in stool and abdominal distention.  Genitourinary: Negative for dysuria, urgency, frequency, hematuria, flank pain, vaginal bleeding, vaginal discharge, difficulty urinating, vaginal pain and pelvic pain.  Musculoskeletal: Negative for joint swelling, arthralgias and gait problem.  Skin: Negative for rash.  Neurological: Positive for weakness. Negative for dizziness, syncope, speech difficulty, numbness and headaches.  Hematological: Negative for adenopathy.  Psychiatric/Behavioral: Negative for behavioral problems, dysphoric mood and  agitation. The patient is not nervous/anxious.        Objective:   Physical Exam  Constitutional: She is oriented to person, place, and time. She appears well-developed and well-nourished.  Elderly Frail Alert No distress Afebrile Blood pressure well controlled O2 saturation 95%  HENT:  Head: Normocephalic.  Right Ear: External ear normal.  Left Ear: External ear normal.  Mouth/Throat: Oropharynx is clear and moist.  Eyes: Conjunctivae and EOM are normal. Pupils are equal, round, and reactive to light.  Neck: Normal range of motion. Neck supple. No thyromegaly present.  Cardiovascular: Normal rate, regular rhythm, normal heart sounds and intact distal pulses.   Pulmonary/Chest: Effort normal and breath sounds normal.  Diminished breath sounds but clear  Abdominal: Soft. Bowel sounds are normal. She exhibits no mass. There is no tenderness.  Musculoskeletal: Normal range of motion.  Lymphadenopathy:    She has no cervical adenopathy.  Neurological: She is alert and oriented to person, place, and time.  Skin: Skin is warm and  dry. No rash noted.  Psychiatric: She has a normal mood and affect. Her behavior is normal.          Assessment & Plan:   Status post community acquired pneumonia. COPD stable Anemia.  Will check a follow-up CBC History of hyponatremia, probably secondary to illness and volume depletion.  Will repeat  Flu vaccine in the fall Recheck 6 months or as needed

## 2015-07-28 ENCOUNTER — Encounter (HOSPITAL_COMMUNITY): Payer: Self-pay | Admitting: *Deleted

## 2015-08-02 ENCOUNTER — Encounter (HOSPITAL_COMMUNITY): Payer: Self-pay

## 2015-08-02 ENCOUNTER — Ambulatory Visit (HOSPITAL_COMMUNITY)
Admission: RE | Admit: 2015-08-02 | Discharge: 2015-08-02 | Disposition: A | Discharge status: Home / Self Care | Payer: Medicare Other | Source: Ambulatory Visit | Attending: Gastroenterology | Admitting: Gastroenterology

## 2015-08-02 ENCOUNTER — Ambulatory Visit (HOSPITAL_COMMUNITY): Payer: Medicare Other | Admitting: Anesthesiology

## 2015-08-02 ENCOUNTER — Encounter (HOSPITAL_COMMUNITY)
Admission: RE | Disposition: A | Discharge status: Home / Self Care | Payer: Self-pay | Source: Ambulatory Visit | Attending: Gastroenterology

## 2015-08-02 DIAGNOSIS — K222 Esophageal obstruction: Secondary | ICD-10-CM | POA: Insufficient documentation

## 2015-08-02 DIAGNOSIS — M160 Bilateral primary osteoarthritis of hip: Secondary | ICD-10-CM | POA: Insufficient documentation

## 2015-08-02 DIAGNOSIS — J449 Chronic obstructive pulmonary disease, unspecified: Secondary | ICD-10-CM | POA: Diagnosis not present

## 2015-08-02 DIAGNOSIS — R1314 Dysphagia, pharyngoesophageal phase: Secondary | ICD-10-CM | POA: Diagnosis not present

## 2015-08-02 DIAGNOSIS — K2950 Unspecified chronic gastritis without bleeding: Secondary | ICD-10-CM | POA: Insufficient documentation

## 2015-08-02 DIAGNOSIS — F329 Major depressive disorder, single episode, unspecified: Secondary | ICD-10-CM | POA: Insufficient documentation

## 2015-08-02 DIAGNOSIS — Z87891 Personal history of nicotine dependence: Secondary | ICD-10-CM | POA: Insufficient documentation

## 2015-08-02 DIAGNOSIS — K449 Diaphragmatic hernia without obstruction or gangrene: Secondary | ICD-10-CM | POA: Insufficient documentation

## 2015-08-02 DIAGNOSIS — R1310 Dysphagia, unspecified: Secondary | ICD-10-CM | POA: Diagnosis present

## 2015-08-02 HISTORY — PX: BALLOON DILATION: SHX5330

## 2015-08-02 HISTORY — PX: ESOPHAGOGASTRODUODENOSCOPY (EGD) WITH PROPOFOL: SHX5813

## 2015-08-02 SURGERY — ESOPHAGOGASTRODUODENOSCOPY (EGD) WITH PROPOFOL
Anesthesia: Monitor Anesthesia Care

## 2015-08-02 MED ORDER — LACTATED RINGERS IV SOLN
INTRAVENOUS | Status: DC
  Administered 2015-08-02: 1000 mL via INTRAVENOUS

## 2015-08-02 MED ORDER — LIDOCAINE HCL (PF) 2 % IJ SOLN
INTRAMUSCULAR | Status: DC | PRN
  Administered 2015-08-02: 40 mg via INTRADERMAL

## 2015-08-02 MED ORDER — LIDOCAINE HCL (CARDIAC) 20 MG/ML IV SOLN
INTRAVENOUS | Status: AC
  Filled 2015-08-02: qty 5

## 2015-08-02 MED ORDER — SODIUM CHLORIDE 0.9 % IV SOLN: INTRAVENOUS | Status: DC

## 2015-08-02 MED ORDER — PROPOFOL 10 MG/ML IV BOLUS
INTRAVENOUS | Status: AC
  Filled 2015-08-02: qty 20

## 2015-08-02 MED ORDER — PROPOFOL 10 MG/ML IV BOLUS
INTRAVENOUS | Status: DC | PRN
  Administered 2015-08-02: 10 mg via INTRAVENOUS
  Administered 2015-08-02: 20 mg via INTRAVENOUS
  Administered 2015-08-02 (×2): 10 mg via INTRAVENOUS

## 2015-08-02 SURGICAL SUPPLY — 15 items

## 2015-08-02 NOTE — Transfer of Care (Signed)
Immediate Anesthesia Transfer of Care Note  Patient: Sharon [name]  Procedure(s) Performed: Procedure(s): ESOPHAGOGASTRODUODENOSCOPY (EGD) WITH PROPOFOL (N/A) BALLOON DILATION (N/A)  Patient Location: ENDO  Anesthesia Type:MAC  Level of Consciousness:  sedated, patient cooperative and responds to stimulation  Airway & Oxygen Therapy:Patient Spontanous Breathing and Patient connected to face mask oxgen  Post-op Assessment:  Report given to ENDO RN and Post -op Vital signs reviewed and stable  Post vital signs:  Reviewed and stable  Last Vitals:  Filed Vitals:   08/02/15 1034  BP: 158/63  Pulse: 59  Temp: 36.5 C  Resp: 15    Complications: No apparent anesthesia complications

## 2015-08-02 NOTE — Anesthesia Preprocedure Evaluation (Signed)
Anesthesia Evaluation  Patient identified by MRN, date of birth, ID band Patient awake    Reviewed: Allergy & Precautions, NPO status , Patient's Chart, lab work & pertinent test results, reviewed documented beta blocker date and time   Airway Mallampati: II   Neck ROM: Full    Dental  (+) Dental Advisory Given, Teeth Intact   Pulmonary COPD COPD inhaler, former smoker,  breath sounds clear to auscultation        Cardiovascular Rhythm:Regular     Neuro/Psych Depression    GI/Hepatic Neg liver ROS, dysphagia   Endo/Other  negative endocrine ROS  Renal/GU negative Renal ROS     Musculoskeletal   Abdominal (+)  Abdomen: soft.    Peds  Hematology negative hematology ROS (+)   Anesthesia Other Findings   Reproductive/Obstetrics                             Anesthesia Physical  Anesthesia Plan  ASA: II  Anesthesia Plan: MAC   Post-op Pain Management:    Induction: Intravenous  Airway Management Planned:   Additional Equipment:   Intra-op Plan:   Post-operative Plan:   Informed Consent: I have reviewed the patients History and Physical, chart, labs and discussed the procedure including the risks, benefits and alternatives for the proposed anesthesia with the patient or authorized representative who has indicated his/her understanding and acceptance.     Plan Discussed with:   Anesthesia Plan Comments:         Anesthesia Quick Evaluation

## 2015-08-02 NOTE — Anesthesia Postprocedure Evaluation (Signed)
  Anesthesia Post-op Note  Patient: Sharon [name]  Procedure(s) Performed: Procedure(s) (LRB): ESOPHAGOGASTRODUODENOSCOPY (EGD) WITH PROPOFOL (N/A) BALLOON DILATION (N/A)  Patient Location: PACU  Anesthesia Type: MAC  Level of Consciousness: awake and alert   Airway and Oxygen Therapy: Patient Spontanous Breathing  Post-op Pain: mild  Post-op Assessment: Post-op Vital signs reviewed, Patient's Cardiovascular Status Stable, Respiratory Function Stable, Patent Airway and No signs of Nausea or vomiting  Last Vitals:  Filed Vitals:   08/02/15 1230  BP: 168/54  Pulse: 126  Temp:   Resp: 16    Post-op Vital Signs: stable   Complications: No apparent anesthesia complications

## 2015-08-02 NOTE — Discharge Instructions (Signed)
Esophagogastroduodenoscopy °Care After °Refer to this sheet in the next few weeks. These instructions provide you with information on caring for yourself after your procedure. Your caregiver may also give you more specific instructions. Your treatment has been planned according to current medical practices, but problems sometimes occur. Call your caregiver if you have any problems or questions after your procedure.  °HOME CARE INSTRUCTIONS °· Do not eat or drink anything until the numbing medicine (local anesthetic) has worn off and your gag reflex has returned. You will know that the local anesthetic has worn off when you can swallow comfortably. °· Do not drive for 12 hours after the procedure or as directed by your caregiver. °· Only take medicines as directed by your caregiver. °SEEK MEDICAL CARE IF:  °· You cannot stop coughing. °· You are not urinating at all or less than usual. °SEEK IMMEDIATE MEDICAL CARE IF: °· You have difficulty swallowing. °· You cannot eat or drink. °· You have worsening throat or chest pain. °· You have dizziness, lightheadedness, or you faint. °· You have nausea or vomiting. °· You have chills. °· You have a fever. °· You have severe abdominal pain. °· You have black, tarry, or bloody stools. °Document Released: 11/20/2012 Document Reviewed: 11/20/2012 °[employer]® Patient Information ©2015 [employer]. This information is not intended to replace advice given to you by your health care provider. Make sure you discuss any questions you have with your health care provider. ° °

## 2015-08-02 NOTE — H&P (Signed)
_                                                                                                                History of Present Illness:  Sharon [name] is a 81 year old white female with history of esophageal stricture, last dilated in May, 2016 complaining of recurrent dysphagia.  She has a history of peptic esophageal stricture for which she has undergone dilation multiple times in the past.   Past Medical History  Diagnosis Date  . B12 DEFICIENCY 03/31/2008  . COPD 11/27/2008  . ESOPHAGEAL STRICTURE 01/29/2009  . MASS, LUNG 10/13/2009  . NASAL FRACTURE 08/12/2009  . OSTEOPOROSIS 12/27/2007  . PEDAL EDEMA 12/27/2007  . PULMONARY NODULE 10/21/2009  . RESPIRATORY FAILURE, CHRONIC 06/29/2010  . OSTEOARTHRITIS 03/31/2008    Hips   Past Surgical History  Procedure Laterality Date  . Cataract extraction Bilateral     last '05  . Lumbar laminectomy    . Skin graf      '98  . Rectal prolapse repair      '06  . Esophagogastroduodenoscopy      with dilatation x3  . Appendectomy      '38  . Cholecystectomy      laparoscopic'10  . Nasal reconstruction with septal repair      '10  . Esophagogastroduodenoscopy (egd) with propofol N/A 05/10/2015    Procedure: ESOPHAGOGASTRODUODENOSCOPY (EGD) WITH PROPOFOL;  Surgeon: Louis Meckel, MD;  Location: [facility];  Service: Endoscopy;  Laterality: N/A;  balloon dilation  . Balloon dilation N/A 05/10/2015    Procedure: BALLOON DILATION;  Surgeon: Louis Meckel, MD;  Location: [facility];  Service: Endoscopy;  Laterality: N/A;  . Breast surgery      biopsy '47   family history includes Hypertension in her son. Current Facility-Administered Medications  Medication Dose Route Frequency Provider Last Rate Last Dose  . 0.9 %  sodium chloride infusion   Intravenous Continuous Louis Meckel, MD      . lactated ringers infusion   Intravenous Continuous Louis Meckel, MD 125 mL/hr at 08/02/15 1039 1,000 mL at 08/02/15 1039    Allergies as of 06/23/2015 - Review Complete 05/10/2015  Allergen Reaction Noted  . Penicillins  02/14/2007    reports that she quit smoking about 36 years ago. She has never used smokeless tobacco. She reports that she drinks alcohol. She reports that she does not use illicit drugs.   Review of Systems: Pertinent positive and negative review of systems were noted in the above HPI section. All other review of systems were otherwise negative.  Vital signs were reviewed in today's medical record Physical Exam: General: Well developed , well nourished, no acute distress Skin: anicteric Head: Normocephalic and atraumatic Eyes:  sclerae anicteric, EOMI Ears: Normal auditory acuity Mouth: No deformity or lesions Neck: Supple, no masses or thyromegaly Lymph Nodes: no lymphadenopathy Lungs: Clear throughout to auscultation Heart: Regular rate and rhythm; no  murmurs, rubs or bruits Gastroinestinal: Soft, non tender and non distended. No masses, hepatosplenomegaly or hernias noted. Normal Bowel sounds Rectal:deferred Musculoskeletal: Symmetrical with no gross deformities  Skin: No lesions on visible extremities Pulses:  Normal pulses noted Extremities: No clubbing, cyanosis, edema or deformities noted Neurological: Alert oriented x 4, grossly nonfocal Cervical Nodes:  No significant cervical adenopathy Inguinal Nodes: No significant inguinal adenopathy Psychological:  Alert and cooperative. Normal mood and affect  Impression-recurrent peptic esophageal stricture  Recommendations-EGD with balloon dilation

## 2015-08-03 ENCOUNTER — Encounter (HOSPITAL_COMMUNITY): Payer: Self-pay | Admitting: Gastroenterology

## 2015-08-09 ENCOUNTER — Telehealth: Payer: Self-pay | Admitting: Internal Medicine

## 2015-08-09 MED ORDER — OMEPRAZOLE 20 MG PO CPDR: 20.0000 mg | DELAYED_RELEASE_CAPSULE | Freq: Every day | ORAL | Status: DC

## 2015-08-09 NOTE — Telephone Encounter (Signed)
Rx sent to pharmacy   

## 2015-08-09 NOTE — Telephone Encounter (Signed)
Pt request refill of the following: omeprazole (PRILOSEC) 20 MG capsule   Phamacy:  [address]

## 2015-09-02 ENCOUNTER — Other Ambulatory Visit: Payer: Self-pay | Admitting: Internal Medicine

## 2015-09-02 ENCOUNTER — Telehealth: Payer: Self-pay | Admitting: Internal Medicine

## 2015-09-02 MED ORDER — IPRATROPIUM-ALBUTEROL 20-100 MCG/ACT IN AERS: 1.0000 | INHALATION_SPRAY | Freq: Four times a day (QID) | RESPIRATORY_TRACT | Status: DC | PRN

## 2015-09-02 NOTE — Telephone Encounter (Signed)
ok 

## 2015-09-02 NOTE — Telephone Encounter (Signed)
Okay to order Combivent Inhaler for pt?

## 2015-09-02 NOTE — Telephone Encounter (Signed)
Left detailed message Rx sent to pharmacy as requested. Any questions please call office.

## 2015-09-02 NOTE — Telephone Encounter (Signed)
Pt request refill of the following: Ipratropium-Albuterol (COMBIVENT) 20-100 MCG/ACT AERS respimat  Pt was given this in the hospital and would like to know if Dr Kirtland Bouchard will refill for her   Phamacy: [address]

## 2015-09-13 ENCOUNTER — Other Ambulatory Visit: Payer: Self-pay | Admitting: Internal Medicine

## 2015-10-02 ENCOUNTER — Other Ambulatory Visit: Payer: Self-pay | Admitting: Internal Medicine

## 2015-10-04 ENCOUNTER — Other Ambulatory Visit: Payer: Self-pay | Admitting: Internal Medicine

## 2015-10-05 ENCOUNTER — Other Ambulatory Visit: Payer: Self-pay

## 2015-10-05 ENCOUNTER — Telehealth: Payer: Self-pay

## 2015-10-05 MED ORDER — ALPRAZOLAM 0.25 MG PO TABS: 0.2500 mg | ORAL_TABLET | Freq: Three times a day (TID) | ORAL | Status: DC | PRN

## 2015-10-05 NOTE — Telephone Encounter (Signed)
Refill request for Alprazolam 0.25mg .

## 2015-10-05 NOTE — Telephone Encounter (Signed)
Rx called in to pharmacy. 

## 2015-10-18 ENCOUNTER — Ambulatory Visit: Payer: Medicare Other | Admitting: Internal Medicine

## 2015-11-15 ENCOUNTER — Other Ambulatory Visit: Payer: Self-pay | Admitting: Internal Medicine

## 2015-12-24 ENCOUNTER — Other Ambulatory Visit: Payer: Self-pay | Admitting: Internal Medicine

## 2016-01-10 ENCOUNTER — Encounter: Payer: Self-pay | Admitting: Internal Medicine

## 2016-01-10 ENCOUNTER — Ambulatory Visit (INDEPENDENT_AMBULATORY_CARE_PROVIDER_SITE_OTHER): Payer: Federal, State, Local not specified - PPO | Admitting: Internal Medicine

## 2016-01-10 VITALS — BP 112/60 | HR 70 | Temp 97.5°F | Resp 18 | Ht 65.0 in | Wt 100.0 lb

## 2016-01-10 DIAGNOSIS — Z23 Encounter for immunization: Secondary | ICD-10-CM | POA: Diagnosis not present

## 2016-01-10 DIAGNOSIS — K222 Esophageal obstruction: Secondary | ICD-10-CM | POA: Diagnosis not present

## 2016-01-10 DIAGNOSIS — J438 Other emphysema: Secondary | ICD-10-CM

## 2016-01-10 NOTE — Progress Notes (Signed)
Subjective:    Patient ID: Sharon [name], female    DOB: May 25, 1923, 82 y.o.   MRN: [id number]  HPI  Wt Readings from Last 3 Encounters:  01/10/16 100 lb (45.36 kg)  08/02/15 98 lb (44.453 kg)  07/09/15 98 lb (44.58 kg)   82 year old patient who is seen today for her six-month follow-up.  She has a history of COPD.  She has done that quite well.  There is been some modest weight gain.  She has a history of osteoporosis, malnutrition, B12 deficiency no real concerns or complaints.  She is accompanied by her daughter.  She was hospitalized last year for to be required pneumonia  Past Medical History  Diagnosis Date  . B12 DEFICIENCY 03/31/2008  . COPD 11/27/2008  . ESOPHAGEAL STRICTURE 01/29/2009  . MASS, LUNG 10/13/2009  . NASAL FRACTURE 08/12/2009  . OSTEOPOROSIS 12/27/2007  . PEDAL EDEMA 12/27/2007  . PULMONARY NODULE 10/21/2009  . RESPIRATORY FAILURE, CHRONIC 06/29/2010  . OSTEOARTHRITIS 03/31/2008    Hips    Social History   Social History  . Marital Status: Widowed    Spouse Name: N/A  . Number of Children: N/A  . Years of Education: N/A   Occupational History  . Not on file.   Social History Main Topics  . Smoking status: Former Smoker    Quit date: 12/18/1978  . Smokeless tobacco: Never Used  . Alcohol Use: 0.0 oz/week    0 Standard drinks or equivalent per week     Comment: couple of gin and tonics daily  . Drug Use: No  . Sexual Activity: Not on file   Other Topics Concern  . Not on file   Social History Narrative    Past Surgical History  Procedure Laterality Date  . Cataract extraction Bilateral     last '05  . Lumbar laminectomy    . Skin graf      '98  . Rectal prolapse repair      '06  . Esophagogastroduodenoscopy      with dilatation x3  . Appendectomy      '38  . Cholecystectomy      laparoscopic'10  . Nasal reconstruction with septal repair      '10  . Esophagogastroduodenoscopy (egd) with propofol N/A 05/10/2015    Procedure:  ESOPHAGOGASTRODUODENOSCOPY (EGD) WITH PROPOFOL;  Surgeon: Louis Meckel, MD;  Location: [facility];  Service: Endoscopy;  Laterality: N/A;  balloon dilation  . Balloon dilation N/A 05/10/2015    Procedure: BALLOON DILATION;  Surgeon: Louis Meckel, MD;  Location: [facility];  Service: Endoscopy;  Laterality: N/A;  . Breast surgery      biopsy '47  . Esophagogastroduodenoscopy (egd) with propofol N/A 08/02/2015    Procedure: ESOPHAGOGASTRODUODENOSCOPY (EGD) WITH PROPOFOL;  Surgeon: Louis Meckel, MD;  Location: [facility];  Service: Endoscopy;  Laterality: N/A;  . Balloon dilation N/A 08/02/2015    Procedure: BALLOON DILATION;  Surgeon: Louis Meckel, MD;  Location: [facility];  Service: Endoscopy;  Laterality: N/A;    Family History  Problem Relation Age of Onset  . Hypertension Son     Allergies  Allergen Reactions  . Penicillins Rash    Also, fever.       Current Outpatient Prescriptions on File Prior to Visit  Medication Sig Dispense Refill  . ALPRAZolam (XANAX) 0.25 MG tablet Take 1 tablet (0.25 mg total) by mouth 3 (three) times daily as needed. 90 tablet 2  . Calcium Carbonate-Vitamin D (CALTRATE  600+D) 600-400 MG-UNIT per chew tablet Chew 1 tablet by mouth every morning.     . cyanocobalamin (,VITAMIN B-12,) 1000 MCG/ML injection INJECT 1 ML IN THE MUSCLE ONCE MONTHLY 10 mL 9  . escitalopram (LEXAPRO) 5 MG tablet TAKE 1 TABLET BY MOUTH DAILY 90 tablet 2  . Ipratropium-Albuterol (COMBIVENT) 20-100 MCG/ACT AERS respimat Inhale 1 puff into the lungs every 6 (six) hours as needed for wheezing or shortness of breath. 1 Inhaler 5  . mirtazapine (REMERON) 15 MG tablet TAKE 1 TABLET BY MOUTH EVERY NIGHT AT BEDTIME 90 tablet 1  . Multiple Vitamins-Minerals (PRESERVISION/LUTEIN) CAPS Take 1 capsule by mouth every morning.     . naproxen sodium (ANAPROX) 220 MG tablet Take 440 mg by mouth 2 (two) times daily as needed (pain).    Bertram Gala Glycol-Propyl Glycol (SYSTANE OP)  Place 1 drop into both eyes as needed (dry eyes.).    Marland Kitchen SYMBICORT 160-4.5 MCG/ACT inhaler INHALE 2 PUFFS BY MOUTH TWICE DAILY. 10.2 g 6   No current facility-administered medications on file prior to visit.    BP 112/60 mmHg  Pulse 70  Temp(Src) 97.5 F (36.4 C) (Oral)  Resp 18  Ht  (1.651 m)  Wt 100 lb (45.36 kg)  BMI 16.64 kg/m2  SpO2 93%      Review of Systems  Constitutional: Negative.   HENT: Negative for congestion, dental problem, hearing loss, rhinorrhea, sinus pressure, sore throat and tinnitus.   Eyes: Negative for pain, discharge and visual disturbance.  Respiratory: Positive for cough and shortness of breath.   Cardiovascular: Negative for chest pain, palpitations and leg swelling.  Gastrointestinal: Negative for nausea, vomiting, abdominal pain, diarrhea, constipation, blood in stool and abdominal distention.  Genitourinary: Negative for dysuria, urgency, frequency, hematuria, flank pain, vaginal bleeding, vaginal discharge, difficulty urinating, vaginal pain and pelvic pain.  Musculoskeletal: Negative for joint swelling, arthralgias and gait problem.  Skin: Negative for rash.  Neurological: Negative for dizziness, syncope, speech difficulty, weakness, numbness and headaches.  Hematological: Negative for adenopathy.  Psychiatric/Behavioral: Negative for behavioral problems, dysphoric mood and agitation. The patient is not nervous/anxious.        Objective:   Physical Exam  Constitutional: She is oriented to person, place, and time. She appears well-developed and well-nourished.  Elderly Frail Weight 100 pounds, O2 saturation 93%.  Pulse rate 70 Blood pressure low normal    HENT:  Head: Normocephalic.  Right Ear: External ear normal.  Left Ear: External ear normal.  Mouth/Throat: Oropharynx is clear and moist.  Eyes: Conjunctivae and EOM are normal. Pupils are equal, round, and reactive to light.  Neck: Normal range of motion. Neck supple. No  thyromegaly present.  Cardiovascular: Normal rate, regular rhythm, normal heart sounds and intact distal pulses.   Pulmonary/Chest: Effort normal. No respiratory distress.  Breath sounds generally diminished Scattered rhonchi.  Her right lower lung field  Abdominal: Soft. Bowel sounds are normal. She exhibits no mass. There is no tenderness.  Musculoskeletal: Normal range of motion.  Lymphadenopathy:    She has no cervical adenopathy.  Neurological: She is alert and oriented to person, place, and time.  Skin: Skin is warm and dry. No rash noted.  Psychiatric: She has a normal mood and affect. Her behavior is normal.          Assessment & Plan:   COPD Malnutrition History dysphagia.  Status post dilatation. Continue PPI therapy  We'll give flu vaccine Medicines updated

## 2016-01-10 NOTE — Patient Instructions (Signed)
Return in 6 months for follow up

## 2016-01-10 NOTE — Progress Notes (Signed)
Pre visit review using our clinic review tool, if applicable. No additional management support is needed unless otherwise documented below in the visit note. 

## 2016-03-13 ENCOUNTER — Other Ambulatory Visit: Payer: Self-pay | Admitting: Internal Medicine

## 2016-05-23 ENCOUNTER — Other Ambulatory Visit: Payer: Self-pay | Admitting: Internal Medicine

## 2016-05-23 NOTE — Telephone Encounter (Signed)
Pt states she is out of town oand has run out of escitalopram (LEXAPRO) 5 MG tablet [facility] [place]. From there she will be going to [place]. May not be back until end of July.

## 2016-05-23 NOTE — Telephone Encounter (Signed)
Pharmacy should be [facility] in  [place] [id number]   [phone]

## 2016-07-09 ENCOUNTER — Emergency Department (HOSPITAL_COMMUNITY): Payer: Medicare Other

## 2016-07-09 ENCOUNTER — Encounter (HOSPITAL_COMMUNITY): Payer: Self-pay

## 2016-07-09 ENCOUNTER — Inpatient Hospital Stay (HOSPITAL_COMMUNITY)
Admission: EM | Admit: 2016-07-09 | Discharge: 2016-07-12 | DRG: 871 | Disposition: A | Discharge status: Home Health | Payer: Medicare Other | Attending: Internal Medicine | Admitting: Internal Medicine

## 2016-07-09 DIAGNOSIS — J440 Chronic obstructive pulmonary disease with acute lower respiratory infection: Secondary | ICD-10-CM | POA: Diagnosis present

## 2016-07-09 DIAGNOSIS — J438 Other emphysema: Secondary | ICD-10-CM

## 2016-07-09 DIAGNOSIS — A419 Sepsis, unspecified organism: Secondary | ICD-10-CM | POA: Diagnosis present

## 2016-07-09 DIAGNOSIS — J9601 Acute respiratory failure with hypoxia: Secondary | ICD-10-CM | POA: Diagnosis not present

## 2016-07-09 DIAGNOSIS — Z9842 Cataract extraction status, left eye: Secondary | ICD-10-CM | POA: Diagnosis not present

## 2016-07-09 DIAGNOSIS — M810 Age-related osteoporosis without current pathological fracture: Secondary | ICD-10-CM | POA: Diagnosis present

## 2016-07-09 DIAGNOSIS — E860 Dehydration: Secondary | ICD-10-CM | POA: Diagnosis present

## 2016-07-09 DIAGNOSIS — E871 Hypo-osmolality and hyponatremia: Secondary | ICD-10-CM | POA: Diagnosis present

## 2016-07-09 DIAGNOSIS — Z9841 Cataract extraction status, right eye: Secondary | ICD-10-CM | POA: Diagnosis not present

## 2016-07-09 DIAGNOSIS — J449 Chronic obstructive pulmonary disease, unspecified: Secondary | ICD-10-CM | POA: Diagnosis present

## 2016-07-09 DIAGNOSIS — F329 Major depressive disorder, single episode, unspecified: Secondary | ICD-10-CM | POA: Diagnosis present

## 2016-07-09 DIAGNOSIS — R7989 Other specified abnormal findings of blood chemistry: Secondary | ICD-10-CM

## 2016-07-09 DIAGNOSIS — F419 Anxiety disorder, unspecified: Secondary | ICD-10-CM | POA: Diagnosis present

## 2016-07-09 DIAGNOSIS — R079 Chest pain, unspecified: Secondary | ICD-10-CM | POA: Diagnosis not present

## 2016-07-09 DIAGNOSIS — F32A Depression, unspecified: Secondary | ICD-10-CM | POA: Diagnosis present

## 2016-07-09 DIAGNOSIS — J189 Pneumonia, unspecified organism: Secondary | ICD-10-CM | POA: Diagnosis present

## 2016-07-09 DIAGNOSIS — I248 Other forms of acute ischemic heart disease: Secondary | ICD-10-CM | POA: Diagnosis present

## 2016-07-09 DIAGNOSIS — D649 Anemia, unspecified: Secondary | ICD-10-CM | POA: Diagnosis present

## 2016-07-09 DIAGNOSIS — E538 Deficiency of other specified B group vitamins: Secondary | ICD-10-CM | POA: Diagnosis present

## 2016-07-09 DIAGNOSIS — M1990 Unspecified osteoarthritis, unspecified site: Secondary | ICD-10-CM | POA: Diagnosis present

## 2016-07-09 DIAGNOSIS — R778 Other specified abnormalities of plasma proteins: Secondary | ICD-10-CM | POA: Insufficient documentation

## 2016-07-09 DIAGNOSIS — Z87891 Personal history of nicotine dependence: Secondary | ICD-10-CM

## 2016-07-09 DIAGNOSIS — J9621 Acute and chronic respiratory failure with hypoxia: Secondary | ICD-10-CM | POA: Diagnosis present

## 2016-07-09 HISTORY — DX: Anemia, unspecified: D64.9

## 2016-07-09 HISTORY — DX: Pneumonia, unspecified organism: J18.9

## 2016-07-09 HISTORY — DX: Reserved for inherently not codable concepts without codable children: IMO0001

## 2016-07-09 LAB — BASIC METABOLIC PANEL
Anion gap: 14 (ref 5–15)
BUN: 27 mg/dL — AB (ref 6–20)
CALCIUM: 9.1 mg/dL (ref 8.9–10.3)
CO2: 23 mmol/L (ref 22–32)
CREATININE: 0.78 mg/dL (ref 0.44–1.00)
Chloride: 91 mmol/L — ABNORMAL LOW (ref 101–111)
GFR calc non Af Amer: >60 mL/min (ref >60)
Glucose, Bld: 100 mg/dL — ABNORMAL HIGH (ref 65–99)
Potassium: 4.3 mmol/L (ref 3.5–5.1)
SODIUM: 128 mmol/L — AB (ref 135–145)

## 2016-07-09 LAB — I-STAT TROPONIN, ED
TROPONIN I, POC: 0.21 ng/mL — AB (ref 0.00–0.08)
Troponin i, poc: 0.02 ng/mL (ref 0.00–0.08)

## 2016-07-09 LAB — CBC
HCT: 34.9 % — ABNORMAL LOW (ref 36.0–46.0)
Hemoglobin: 11.9 g/dL — ABNORMAL LOW (ref 12.0–15.0)
MCH: 31.6 pg (ref 26.0–34.0)
MCHC: 34.1 g/dL (ref 30.0–36.0)
MCV: 92.6 fL (ref 78.0–100.0)
PLATELETS: 267 10*3/uL (ref 150–400)
RBC: 3.77 MIL/uL — AB (ref 3.87–5.11)
RDW: 14 % (ref 11.5–15.5)
WBC: 20 10*3/uL — AB (ref 4.0–10.5)

## 2016-07-09 LAB — I-STAT CG4 LACTIC ACID, ED
LACTIC ACID, VENOUS: 1.03 mmol/L (ref 0.5–1.9)
LACTIC ACID, VENOUS: 1.08 mmol/L (ref 0.5–1.9)

## 2016-07-09 MED ORDER — ASPIRIN EC 81 MG PO TBEC
81.0000 mg | DELAYED_RELEASE_TABLET | Freq: Every day | ORAL | Status: DC
Start: 2016-07-10 — End: 2016-07-12
  Administered 2016-07-10 – 2016-07-12 (×3): 81 mg via ORAL
  Filled 2016-07-09 (×3): qty 1

## 2016-07-09 MED ORDER — IPRATROPIUM-ALBUTEROL 0.5-2.5 (3) MG/3ML IN SOLN: 3.0000 mL | RESPIRATORY_TRACT | Status: DC | PRN

## 2016-07-09 MED ORDER — ASPIRIN 81 MG PO CHEW
324.0000 mg | CHEWABLE_TABLET | Freq: Once | ORAL | Status: AC
  Administered 2016-07-10: 324 mg via ORAL
  Filled 2016-07-09: qty 4

## 2016-07-09 MED ORDER — DEXTROSE 5 % IV SOLN
1.0000 g | Freq: Once | INTRAVENOUS | Status: AC
  Administered 2016-07-09: 1 g via INTRAVENOUS
  Filled 2016-07-09: qty 10

## 2016-07-09 MED ORDER — ATORVASTATIN CALCIUM 40 MG PO TABS
40.0000 mg | ORAL_TABLET | Freq: Every day | ORAL | Status: DC
  Administered 2016-07-10: 40 mg via ORAL
  Filled 2016-07-09 (×4): qty 1

## 2016-07-09 MED ORDER — MOMETASONE FURO-FORMOTEROL FUM 200-5 MCG/ACT IN AERO
2.0000 | INHALATION_SPRAY | Freq: Two times a day (BID) | RESPIRATORY_TRACT | Status: DC
  Administered 2016-07-10 – 2016-07-12 (×5): 2 via RESPIRATORY_TRACT
  Filled 2016-07-09: qty 8.8

## 2016-07-09 MED ORDER — CEFTRIAXONE SODIUM 1 G IJ SOLR
1.0000 g | INTRAMUSCULAR | Status: DC
  Administered 2016-07-10 – 2016-07-11 (×2): 1 g via INTRAVENOUS
  Filled 2016-07-09 (×3): qty 10

## 2016-07-09 MED ORDER — ESCITALOPRAM OXALATE 10 MG PO TABS
5.0000 mg | ORAL_TABLET | Freq: Every day | ORAL | Status: DC
  Administered 2016-07-10 – 2016-07-12 (×3): 5 mg via ORAL
  Filled 2016-07-09 (×3): qty 1

## 2016-07-09 MED ORDER — DEXTROSE 5 % IV SOLN
500.0000 mg | Freq: Once | INTRAVENOUS | Status: AC
  Administered 2016-07-09: 500 mg via INTRAVENOUS
  Filled 2016-07-09: qty 500

## 2016-07-09 MED ORDER — NITROGLYCERIN 0.4 MG SL SUBL: 0.4000 mg | SUBLINGUAL_TABLET | SUBLINGUAL | Status: DC | PRN

## 2016-07-09 MED ORDER — CALCIUM CARBONATE-VITAMIN D 500-200 MG-UNIT PO TABS
1.0000 | ORAL_TABLET | Freq: Every day | ORAL | Status: DC
  Administered 2016-07-10: 1 via ORAL
  Filled 2016-07-09 (×2): qty 1

## 2016-07-09 MED ORDER — AZITHROMYCIN 500 MG IV SOLR
500.0000 mg | INTRAVENOUS | Status: DC
  Administered 2016-07-10 – 2016-07-11 (×2): 500 mg via INTRAVENOUS
  Filled 2016-07-09 (×4): qty 500

## 2016-07-09 MED ORDER — HYPROMELLOSE (GONIOSCOPIC) 2.5 % OP SOLN
1.0000 [drp] | Freq: Four times a day (QID) | OPHTHALMIC | Status: DC | PRN
  Filled 2016-07-09: qty 15

## 2016-07-09 MED ORDER — ONDANSETRON HCL 4 MG/2ML IJ SOLN: 4.0000 mg | Freq: Four times a day (QID) | INTRAMUSCULAR | Status: DC | PRN

## 2016-07-09 MED ORDER — SODIUM CHLORIDE 0.9 % IV SOLN
INTRAVENOUS | Status: AC
Start: 2016-07-09 — End: 2016-07-10
  Administered 2016-07-10 (×2): via INTRAVENOUS

## 2016-07-09 MED ORDER — ENOXAPARIN SODIUM 30 MG/0.3ML ~~LOC~~ SOLN
20.0000 mg | SUBCUTANEOUS | Status: DC
Start: 2016-07-10 — End: 2016-07-12
  Administered 2016-07-10 – 2016-07-12 (×3): 20 mg via SUBCUTANEOUS
  Filled 2016-07-09: qty 0.2
  Filled 2016-07-09 (×3): qty 0.3
  Filled 2016-07-09: qty 0.2
  Filled 2016-07-09: qty 0.3
  Filled 2016-07-09: qty 0.2

## 2016-07-09 MED ORDER — SODIUM CHLORIDE 0.9 % IV BOLUS (SEPSIS)
500.0000 mL | Freq: Once | INTRAVENOUS | Status: AC
  Administered 2016-07-09: 500 mL via INTRAVENOUS

## 2016-07-09 MED ORDER — ACETAMINOPHEN 325 MG PO TABS
650.0000 mg | ORAL_TABLET | ORAL | Status: DC | PRN
  Administered 2016-07-10: 650 mg via ORAL
  Filled 2016-07-09: qty 2

## 2016-07-09 MED ORDER — PROSIGHT PO TABS
1.0000 | ORAL_TABLET | Freq: Two times a day (BID) | ORAL | Status: DC
  Filled 2016-07-09 (×6): qty 1

## 2016-07-09 MED ORDER — ALPRAZOLAM 0.25 MG PO TABS: 0.2500 mg | ORAL_TABLET | Freq: Three times a day (TID) | ORAL | Status: DC | PRN

## 2016-07-09 MED ORDER — MIRTAZAPINE 15 MG PO TABS
15.0000 mg | ORAL_TABLET | Freq: Every day | ORAL | Status: DC
  Administered 2016-07-10 – 2016-07-11 (×3): 15 mg via ORAL
  Filled 2016-07-09 (×4): qty 1

## 2016-07-09 NOTE — H&P (Signed)
History and Physical    Sharon [name] RJJ:[id number] DOB: 1923/04/04 DOA: 07/09/2016  PCP: Rogelia Boga, MD   Patient coming from: Home   Chief Complaint: Fatigue, chills, loss of appetite    HPI: Sharon [name] is a 82 y.o. female with medical history significant for COPD and esophageal stricture status post dilation who presents the emergency department with 3 days of lethargy and loss of appetite with 1 day of fever, chills, and soreness in the chest and back on deep inspiration or coughing. The patient reports that she had been in her usual state of health while traveling in [place] in [place] with her family. Towards the end of the trip, she developed some hoarseness and a mild sore throat which has improved and nearly resolved by the time of their arrival back home on 07/06/2016. The day after their return home, the patient slept most of the day and had no appetite. Initially, her family attributed this to likely jetlagged and did not think too much of it. Over the ensuing days, however, she continued to be sleeping much more than usual and her appetite had not picked up. Today, the patient had subjective fevers and was noted to have rigors by her family. She describes the chest and back pain as soreness, localized to the mid chest and mid back, only noticeable with deep inspiration or cough, and alleviated while at rest. She denies palpitations, lower extremity swelling, orthopnea, or hemoptysis. There is been no personal or family history of VTE. Patient suffered a mechanical fall while in [place] and had subsequent right lower extremity pain that was evaluated there with radiographs and venous Doppler, both reportedly negative. Leg pain has resolved. Patient denies any history of coronary artery disease or angina. She does not require supplemental oxygen at her baseline.  ED Course: Upon arrival to the ED, patient is found to be febrile to 38 C, saturating only 90% on room air, and  with vitals otherwise stable. EKG demonstrates a sinus rhythm with PACs. Chest x-ray features a left upper lobe airspace consolidation most consistent with a pneumonia on a background of emphysematous changes. Chemistry panel features a serum sodium of 128 and a BUN to creatinine ratio of 35. CBC features a leukocytosis to 20,000 and a stable normocytic anemia with hemoglobin of 11.9. Lactic acid is reassuring at 1.03 and troponin is elevated to a value of 0.21. Patient was given a 500 mL normal saline bolus in the emergency department and blood cultures were obtained. She was treated with a full dose aspirin chew and empiric Rocephin and azithromycin. She has remained hemodynamically stable in the emergency department with no ischemic features noted on telemetry monitoring. She will be admitted to the telemetry unit for ongoing evaluation and management of community-acquired pneumonia with elevated troponin suspected secondary to demand ischemia.  Review of Systems:  All other systems reviewed and apart from HPI, are negative.  Past Medical History:  Diagnosis Date  . B12 DEFICIENCY 03/31/2008  . COPD 11/27/2008  . ESOPHAGEAL STRICTURE 01/29/2009  . MASS, LUNG 10/13/2009  . NASAL FRACTURE 08/12/2009  . OSTEOARTHRITIS 03/31/2008   Hips  . OSTEOPOROSIS 12/27/2007  . PEDAL EDEMA 12/27/2007  . PULMONARY NODULE 10/21/2009  . RESPIRATORY FAILURE, CHRONIC 06/29/2010    Past Surgical History:  Procedure Laterality Date  . APPENDECTOMY     '38  . BALLOON DILATION N/A 05/10/2015   Procedure: BALLOON DILATION;  Surgeon: Louis Meckel, MD;  Location: [facility];  Service: Endoscopy;  Laterality: N/A;  . BALLOON DILATION N/A 08/02/2015   Procedure: BALLOON DILATION;  Surgeon: Louis Meckel, MD;  Location: [facility];  Service: Endoscopy;  Laterality: N/A;  . BREAST SURGERY     biopsy '47  . CATARACT EXTRACTION Bilateral    last '05  . CHOLECYSTECTOMY     laparoscopic'10  .  ESOPHAGOGASTRODUODENOSCOPY     with dilatation x3  . ESOPHAGOGASTRODUODENOSCOPY (EGD) WITH PROPOFOL N/A 05/10/2015   Procedure: ESOPHAGOGASTRODUODENOSCOPY (EGD) WITH PROPOFOL;  Surgeon: Louis Meckel, MD;  Location: [facility];  Service: Endoscopy;  Laterality: N/A;  balloon dilation  . ESOPHAGOGASTRODUODENOSCOPY (EGD) WITH PROPOFOL N/A 08/02/2015   Procedure: ESOPHAGOGASTRODUODENOSCOPY (EGD) WITH PROPOFOL;  Surgeon: Louis Meckel, MD;  Location: [facility];  Service: Endoscopy;  Laterality: N/A;  . LUMBAR LAMINECTOMY    . NASAL RECONSTRUCTION WITH SEPTAL REPAIR     '10  . RECTAL PROLAPSE REPAIR     '06  . skin graf     '98     reports that she quit smoking about 37 years ago. She has never used smokeless tobacco. She reports that she drinks alcohol. She reports that she does not use drugs.  Allergies  Allergen Reactions  . Penicillins Rash    Has patient had a PCN reaction causing immediate rash, facial/tongue/throat swelling, SOB or lightheadedness with hypotension: Yes Has patient had a PCN reaction causing severe rash involving mucus membranes or skin necrosis: Unknown Has patient had a PCN reaction that required hospitalization: Unknown Has patient had a PCN reaction occurring within the last 10 years: No If all of the above answers are "NO", then may proceed with Cephalosporin use.     Family History  Problem Relation Age of Onset  . Hypertension Son      Prior to Admission medications   Medication Sig Start Date End Date Taking? Authorizing Provider  ALPRAZolam (XANAX) 0.25 MG tablet Take 1 tablet (0.25 mg total) by mouth 3 (three) times daily as needed. 10/05/15  Yes Gordy Savers, MD  Calcium Carbonate-Vitamin D (CALTRATE 600+D) 600-400 MG-UNIT per chew tablet Chew 1 tablet by mouth every morning.    Yes Historical Provider, MD  cyanocobalamin (,VITAMIN B-12,) 1000 MCG/ML injection INJECT 1 ML IN THE MUSCLE ONCE MONTHLY 10/05/15  Yes Shelva Majestic, MD    escitalopram (LEXAPRO) 5 MG tablet TAKE 1 TABLET BY MOUTH DAILY 05/23/16  Yes Gordy Savers, MD  Ipratropium-Albuterol (COMBIVENT) 20-100 MCG/ACT AERS respimat Inhale 1 puff into the lungs every 6 (six) hours as needed for wheezing or shortness of breath. 09/02/15  Yes Gordy Savers, MD  mirtazapine (REMERON) 15 MG tablet TAKE 1 TABLET BY MOUTH EVERY NIGHT AT BEDTIME 03/14/16  Yes Gordy Savers, MD  Multiple Vitamins-Minerals (ICAPS AREDS 2) CAPS Take 1 capsule by mouth 2 (two) times daily.   Yes Historical Provider, MD  naproxen sodium (ANAPROX) 220 MG tablet Take 440 mg by mouth 2 (two) times daily as needed (pain).   Yes Historical Provider, MD  Polyethyl Glycol-Propyl Glycol (SYSTANE OP) Place 1 drop into both eyes 4 (four) times daily as needed (for dry eyes).    Yes Historical Provider, MD  SYMBICORT 160-4.5 MCG/ACT inhaler INHALE 2 PUFFS BY MOUTH TWICE DAILY. 12/24/15  Yes Gordy Savers, MD  traMADol (ULTRAM) 50 MG tablet Take 50 mg by mouth every 12 (twelve) hours as needed for moderate pain.   Yes Historical Provider, MD  mirtazapine (REMERON) 15 MG tablet TAKE 1 TABLET BY  MOUTH EVERY NIGHT AT BEDTIME Patient not taking: Reported on 07/09/2016 11/15/15   Gordy Savers, MD    Physical Exam: Vitals:   07/09/16 2021 07/09/16 2030 07/09/16 2100 07/09/16 2130  BP:  (!) 109/54 113/66 104/68  Pulse:  75 72 70  Resp:  22 21 16   Temp:      TempSrc:      SpO2:  97% 95% 95%  Weight: 44.2 kg (97 lb 7.1 oz)     Height: 5\' 5"  (1.651 m)         Constitutional: NAD, calm, comfortable Eyes: PERTLA, lids and conjunctivae normal ENMT: Mucous membranes are moist. Posterior pharynx clear of any exudate or lesions.   Neck: normal, supple, no masses, no thyromegaly Respiratory: Rhonchi at left mid-lung zone. No pallor. Normal respiratory effort. No accessory muscle use.  Cardiovascular: S1 & S2 heard, regular rate and rhythm. No extremity edema. No carotid bruits. No  significant JVD. Abdomen: No distension, no tenderness, no masses palpated. Bowel sounds normal.  Musculoskeletal: no clubbing / cyanosis. No joint deformity upper and lower extremities.  Skin: no significant rashes, lesions, ulcers. Warm, dry, well-perfused. Neurologic: CN 2-12 grossly intact. Sensation intact, DTR normal. Strength 5/5 in all 4 limbs.  Psychiatric: Normal judgment and insight. Alert and oriented x 3. Normal mood and affect.     Labs on Admission: I have personally reviewed following labs and imaging studies  CBC:  Recent Labs Lab 07/09/16 1942  WBC 20.0*  HGB 11.9*  HCT 34.9*  MCV 92.6  PLT 267   Basic Metabolic Panel:  Recent Labs Lab 07/09/16 1942  NA 128*  K 4.3  CL 91*  CO2 23  GLUCOSE 100*  BUN 27*  CREATININE 0.78  CALCIUM 9.1   GFR: Estimated Creatinine Clearance: 32 mL/min (by C-G formula based on SCr of 0.8 mg/dL). Liver Function Tests: No results for input(s): AST, ALT, ALKPHOS, BILITOT, PROT, ALBUMIN in the last 168 hours. No results for input(s): LIPASE, AMYLASE in the last 168 hours. No results for input(s): AMMONIA in the last 168 hours. Coagulation Profile: No results for input(s): INR, PROTIME in the last 168 hours. Cardiac Enzymes: No results for input(s): CKTOTAL, CKMB, CKMBINDEX, TROPONINI in the last 168 hours. BNP (last 3 results) No results for input(s): PROBNP in the last 8760 hours. HbA1C: No results for input(s): HGBA1C in the last 72 hours. CBG: No results for input(s): GLUCAP in the last 168 hours. Lipid Profile: No results for input(s): CHOL, HDL, LDLCALC, TRIG, CHOLHDL, LDLDIRECT in the last 72 hours. Thyroid Function Tests: No results for input(s): TSH, T4TOTAL, FREET4, T3FREE, THYROIDAB in the last 72 hours. Anemia Panel: No results for input(s): VITAMINB12, FOLATE, FERRITIN, TIBC, IRON, RETICCTPCT in the last 72 hours. Urine analysis:    Component Value Date/Time   COLORURINE YELLOW 06/05/2010 1739    APPEARANCEUR CLEAR 06/05/2010 1739   LABSPEC 1.018 06/05/2010 1739   PHURINE 7.0 06/05/2010 1739   GLUCOSEU NEGATIVE 06/05/2010 1739   HGBUR NEGATIVE 06/05/2010 1739   HGBUR negative 03/24/2008 1013   BILIRUBINUR n 02/08/2015 1023   KETONESUR 40 (A) 06/05/2010 1739   PROTEINUR n 02/08/2015 1023   PROTEINUR NEGATIVE 06/05/2010 1739   UROBILINOGEN 0.2 02/08/2015 1023   UROBILINOGEN 0.2 06/05/2010 1739   NITRITE n 02/08/2015 1023   NITRITE NEGATIVE 06/05/2010 1739   LEUKOCYTESUR small (1+) 02/08/2015 1023   Sepsis Labs: @LABRCNTIP (procalcitonin:4,lacticidven:4) )No results found for this or any previous visit (from the past 240 hour(s)).  Radiological Exams on Admission: Dg Chest 2 View  Result Date: 07/09/2016 CLINICAL DATA:  Left-sided chest pain. Shortness of breath. History of COPD. EXAM: CHEST  2 VIEW COMPARISON:  PA and lateral chest 06/29/2010. Single-view of the chest 07/02/2015. FINDINGS: The chest is markedly hyperexpanded. Airspace disease identified in the left upper lobe. The right lung is clear. Heart size is normal. No pneumothorax or pleural effusion. Aortic atherosclerosis is noted. IMPRESSION: Left upper lobe airspace disease most consistent with pneumonia. Recommend followup to clearing. Emphysema. Atherosclerosis. Electronically Signed   By: Drusilla Kanner M.D.   On: 07/09/2016 19:55   EKG: Independently reviewed. Sinus rhythm, PAC  Assessment/Plan  1. Sepsis secondary to CAP with acute hypoxic respiratory failure  - Presents with fever, leukocytosis, and PNA on CXR  - Requiring 2 Lpm supplemental O2 to maintain sat >90% - No hx of MDR organisms and no identifiable RF's for such  - Blood cultures incubating, sputum cultures requested  - Check urine antigens for strep pneumo and legionella  - Lactate reassuring at 1.06, will trend PCT  - Empiric treatment initiated with Rocephin and azithromycin, will continue while awaiting culture data  - Continuous pulse  oximetry with titration of FiO2 to maintain sat >92%    2. Elevated troponin  - Troponin elevated to 0.21 on admission - Pt reports chest and back soreness with cough or deep inspiration, likely d/t PNA; denies CP, nausea, diaphoresis, or hx of CAD  - EKG is reassuring with no ischemic features  - Suspect this is secondary to demand ischemia in setting of PNA, rather than ACS  - She was given a full-dose ASA chew in ED, will start a high-intensity statin while ruling-out ACS, beta-blocker held d/t sepsis with marginal BP - Monitor on telemetry for ischemic changes  - Obtain serial cardiac biomarker measurements  - Repeat EKG in am, sooner for any angina  - Check TTE in am, consider cardiology consultation for any wall motion abnormalities  3. COPD - No supplemental O2 requirement at baseline  - No wheezing at time of admission  - Continue inhaled LABA/ICS with Dulera while in hospital  - DuoNeb q4h prn SOB or wheezing  - Continuous pulse oximetry with titration of FiO2 to maintain sat >92%    4. Hyponatremia  - Serum sodium 128 on admission, appears to be a chronic problem  - There is evidence of dehydration on exam, and with BUN:Cr ratio 35  - Given a 500 cc NS bolus in ED, continuing NS at 75 cc/hr overnight  - Repeat chem panel in am    5. Depression, anxiety  - Stable  - Continue current management with Lexapro, Remeron, and prn Xanax   6. Normocytic anemia  - Hgb appears stable at 11.9  - No s/s of active blood loss     DVT prophylaxis: sq Lovenox  Code Status: Full  Family Communication: Children updated at bedside  Disposition Plan: Admit to telemetry  Consults called: None Admission status: Inpatient    Briscoe Deutscher, MD [facility] Pager [phone]  If 7PM-7AM, please contact night-coverage [email or web address] Password [facility]  07/09/2016, 10:54 PM

## 2016-07-09 NOTE — ED Provider Notes (Signed)
[facility]-EMERGENCY DEPT Provider Note   CSN: [id number] Arrival date & time: 07/09/16  4098  First Provider Contact:  First MD Initiated Contact with Patient 07/09/16 1954        History   Chief Complaint Chief Complaint  Patient presents with  . Cough  . Chest Pain  . Back Pain    HPI Sharon [name] is a 82 y.o. female.  HPI Patient's a 82 year old female with requiring daily inhaled glucocorticoid and recent increase in use of albuterol MDI presenting today for increasing sleepiness and decreased appetite over the last 2 days. She recently returned from a trip around the country in which she was gone for 2 months. She was gone she did have increase congestion with voice loss. She endorses chronic cough that has been unchanged recently. She denies any fevers, chills, nausea, vomiting, sore throat, dysuria, headache, vision changes, abdominal pain. She does endorse chest pain in the left center chest that radiates through to her back. This pain is described as a pressure, ache, and sharp pain that gets worse when she takes a deep breath or coughs. He does not have any specific relieving factors. Past Medical History:  Diagnosis Date  . B12 DEFICIENCY 03/31/2008  . COPD 11/27/2008  . ESOPHAGEAL STRICTURE 01/29/2009  . MASS, LUNG 10/13/2009  . NASAL FRACTURE 08/12/2009  . OSTEOARTHRITIS 03/31/2008   Hips  . OSTEOPOROSIS 12/27/2007  . PEDAL EDEMA 12/27/2007  . PULMONARY NODULE 10/21/2009  . RESPIRATORY FAILURE, CHRONIC 06/29/2010    Patient Active Problem List   Diagnosis Date Noted  . Elevated troponin   . Acute respiratory failure with hypoxia (HCC) 07/04/2015  . Hyponatremia 07/04/2015  . Community acquired pneumonia 07/02/2015  . COPD (chronic obstructive pulmonary disease) with emphysema (HCC) 07/02/2015  . Normocytic normochromic anemia 07/02/2015  . CAP (community acquired pneumonia) 07/02/2015  . Sepsis (HCC) 07/02/2015  . Dysphagia 02/25/2015  . Depression 09/25/2013  .  RESPIRATORY FAILURE, CHRONIC 06/29/2010  . PULMONARY NODULE 10/21/2009  . MASS, LUNG 10/13/2009  . NASAL FRACTURE 08/12/2009  . ESOPHAGEAL STRICTURE 01/29/2009  . COPD exacerbation (HCC) 11/27/2008  . B12 DEFICIENCY 03/31/2008  . Osteoarthritis 03/31/2008  . Osteoporosis 12/27/2007  . PEDAL EDEMA 12/27/2007    Past Surgical History:  Procedure Laterality Date  . APPENDECTOMY     '38  . BALLOON DILATION N/A 05/10/2015   Procedure: BALLOON DILATION;  Surgeon: Louis Meckel, MD;  Location: [facility];  Service: Endoscopy;  Laterality: N/A;  . BALLOON DILATION N/A 08/02/2015   Procedure: BALLOON DILATION;  Surgeon: Louis Meckel, MD;  Location: [facility];  Service: Endoscopy;  Laterality: N/A;  . BREAST SURGERY     biopsy '47  . CATARACT EXTRACTION Bilateral    last '05  . CHOLECYSTECTOMY     laparoscopic'10  . ESOPHAGOGASTRODUODENOSCOPY     with dilatation x3  . ESOPHAGOGASTRODUODENOSCOPY (EGD) WITH PROPOFOL N/A 05/10/2015   Procedure: ESOPHAGOGASTRODUODENOSCOPY (EGD) WITH PROPOFOL;  Surgeon: Louis Meckel, MD;  Location: [facility];  Service: Endoscopy;  Laterality: N/A;  balloon dilation  . ESOPHAGOGASTRODUODENOSCOPY (EGD) WITH PROPOFOL N/A 08/02/2015   Procedure: ESOPHAGOGASTRODUODENOSCOPY (EGD) WITH PROPOFOL;  Surgeon: Louis Meckel, MD;  Location: [facility];  Service: Endoscopy;  Laterality: N/A;  . LUMBAR LAMINECTOMY    . NASAL RECONSTRUCTION WITH SEPTAL REPAIR     '10  . RECTAL PROLAPSE REPAIR     '06  . skin graf     '98    OB History  No data available       Home Medications    Prior to Admission medications   Medication Sig Start Date End Date Taking? Authorizing Provider  ALPRAZolam (XANAX) 0.25 MG tablet Take 1 tablet (0.25 mg total) by mouth 3 (three) times daily as needed. Patient taking differently: Take 0.25 mg by mouth 3 (three) times daily as needed for anxiety.  10/05/15  Yes Gordy Savers, MD  Calcium Carbonate-Vitamin D  (CALTRATE 600+D) 600-400 MG-UNIT per chew tablet Chew 1 tablet by mouth every morning.    Yes Historical Provider, MD  cyanocobalamin (,VITAMIN B-12,) 1000 MCG/ML injection INJECT 1 ML IN THE MUSCLE ONCE MONTHLY 10/05/15  Yes Shelva Majestic, MD  escitalopram (LEXAPRO) 5 MG tablet TAKE 1 TABLET BY MOUTH DAILY 05/23/16  Yes Gordy Savers, MD  Ipratropium-Albuterol (COMBIVENT) 20-100 MCG/ACT AERS respimat Inhale 1 puff into the lungs every 6 (six) hours as needed for wheezing or shortness of breath. 09/02/15  Yes Gordy Savers, MD  mirtazapine (REMERON) 15 MG tablet TAKE 1 TABLET BY MOUTH EVERY NIGHT AT BEDTIME 03/14/16  Yes Gordy Savers, MD  Multiple Vitamins-Minerals (ICAPS AREDS 2) CAPS Take 1 capsule by mouth 2 (two) times daily.   Yes Historical Provider, MD  naproxen sodium (ANAPROX) 220 MG tablet Take 220-440 mg by mouth 2 (two) times daily as needed (pain).    Yes Historical Provider, MD  Polyethyl Glycol-Propyl Glycol (SYSTANE OP) Place 1 drop into both eyes 4 (four) times daily as needed (for dry eyes).    Yes Historical Provider, MD  SYMBICORT 160-4.5 MCG/ACT inhaler INHALE 2 PUFFS BY MOUTH TWICE DAILY. 12/24/15  Yes Gordy Savers, MD  traMADol (ULTRAM) 50 MG tablet Take 50 mg by mouth every 12 (twelve) hours as needed for moderate pain.   Yes Historical Provider, MD  mirtazapine (REMERON) 15 MG tablet TAKE 1 TABLET BY MOUTH EVERY NIGHT AT BEDTIME Patient not taking: Reported on 07/09/2016 11/15/15   Gordy Savers, MD    Family History Family History  Problem Relation Age of Onset  . Hypertension Son     Social History Social History  Substance Use Topics  . Smoking status: Former Smoker    Quit date: 12/18/1978  . Smokeless tobacco: Never Used  . Alcohol use 0.0 oz/week     Comment: couple of gin and tonics daily     Allergies   Penicillins   Review of Systems Review of Systems  Constitutional: Positive for fatigue. Negative for chills,  diaphoresis and fever.  HENT: Positive for congestion. Negative for sore throat.   Eyes: Negative for pain and visual disturbance.  Respiratory: Positive for cough. Negative for shortness of breath.   Cardiovascular: Positive for chest pain. Negative for palpitations.  Gastrointestinal: Negative for abdominal pain and vomiting.  Genitourinary: Negative for dysuria and hematuria.  Musculoskeletal: Positive for back pain. Negative for arthralgias.  Skin: Negative for color change and rash.  Neurological: Negative for seizures and syncope.  All other systems reviewed and are negative.    Physical Exam Updated Vital Signs BP (!) 111/50 (BP Location: Right Arm)   Pulse 75   Temp 98.5 F (36.9 C) (Oral)   Resp 18   Ht  (1.651 m)   Wt 45 kg   SpO2 97%   BMI 16.52 kg/m   Physical Exam  Constitutional: She appears well-developed and well-nourished. No distress.  HENT:  Head: Normocephalic and atraumatic.  Eyes: Conjunctivae are normal.  Neck: Neck  supple.  Cardiovascular: Normal rate and regular rhythm.   No murmur heard. Pulmonary/Chest: Effort normal and breath sounds normal. No respiratory distress.  Abdominal: Soft. She exhibits no mass. There is no tenderness. There is no guarding.  Musculoskeletal: She exhibits no edema.  Neurological: She is alert.  Skin: Skin is warm and dry.  Significant bruising to the right lower extremity secondary to fall on her trip.  Psychiatric: She has a normal mood and affect.  Nursing note and vitals reviewed.    ED Treatments / Results  Labs (all labs ordered are listed, but only abnormal results are displayed) Labs Reviewed  BASIC METABOLIC PANEL - Abnormal; Notable for the following:       Result Value   Sodium 128 (*)    Chloride 91 (*)    Glucose, Bld 100 (*)    BUN 27 (*)    All other components within normal limits  CBC - Abnormal; Notable for the following:    WBC 20.0 (*)    RBC 3.77 (*)    Hemoglobin 11.9 (*)     HCT 34.9 (*)    All other components within normal limits  TROPONIN I - Abnormal; Notable for the following:    Troponin I 0.10 (*)    All other components within normal limits  I-STAT TROPOININ, ED - Abnormal; Notable for the following:    Troponin i, poc 0.21 (*)    All other components within normal limits  CULTURE, BLOOD (ROUTINE X 2)  CULTURE, BLOOD (ROUTINE X 2)  CULTURE, EXPECTORATED SPUTUM-ASSESSMENT  GRAM STAIN  HIV ANTIBODY (ROUTINE TESTING)  STREP PNEUMONIAE URINARY ANTIGEN  COMPREHENSIVE METABOLIC PANEL  TROPONIN I  TROPONIN I  CBC WITH DIFFERENTIAL/PLATELET  LEGIONELLA PNEUMOPHILA SEROGP 1 UR AG  I-STAT CG4 LACTIC ACID, ED  I-STAT TROPOININ, ED  I-STAT CG4 LACTIC ACID, ED    EKG  EKG Interpretation  Date/Time:  Sunday July 09 2016 19:30:12 EDT Ventricular Rate:  86 PR Interval:  160 QRS Duration: 74 QT Interval:  356 QTC Calculation: 426 R Axis:   89 Text Interpretation:  Sinus rhythm with Premature atrial complexes Otherwise normal ECG Confirmed by ZAVITZ MD, JOSHUA [phone]) on 07/09/2016 8:54:35 PM       Radiology Dg Chest 2 View  Result Date: 07/09/2016 CLINICAL DATA:  Left-sided chest pain. Shortness of breath. History of COPD. EXAM: CHEST  2 VIEW COMPARISON:  PA and lateral chest 06/29/2010. Single-view of the chest 07/02/2015. FINDINGS: The chest is markedly hyperexpanded. Airspace disease identified in the left upper lobe. The right lung is clear. Heart size is normal. No pneumothorax or pleural effusion. Aortic atherosclerosis is noted. IMPRESSION: Left upper lobe airspace disease most consistent with pneumonia. Recommend followup to clearing. Emphysema. Atherosclerosis. Electronically Signed   By: Drusilla Kanner M.D.   On: 07/09/2016 19:55   Procedures Procedures (including critical care time)  Medications Ordered in ED Medications  aspirin EC tablet 81 mg (not administered)  nitroGLYCERIN (NITROSTAT) SL tablet 0.4 mg (not administered)    acetaminophen (TYLENOL) tablet 650 mg (650 mg Oral Given 07/10/16 0006)  ondansetron (ZOFRAN) injection 4 mg (not administered)  enoxaparin (LOVENOX) injection 20 mg (not administered)  0.9 %  sodium chloride infusion ( Intravenous New Bag/Given 07/10/16 0006)  atorvastatin (LIPITOR) tablet 40 mg (40 mg Oral Given 07/10/16 0006)  cefTRIAXone (ROCEPHIN) 1 g in dextrose 5 % 50 mL IVPB (not administered)  azithromycin (ZITHROMAX) 500 mg in dextrose 5 % 250 mL IVPB (not administered)  ALPRAZolam (XANAX) tablet 0.25 mg (not administered)  calcium-vitamin D (OSCAL WITH D) 500-200 MG-UNIT per tablet 1 tablet (not administered)  escitalopram (LEXAPRO) tablet 5 mg (not administered)  mirtazapine (REMERON) tablet 15 mg (15 mg Oral Given 07/10/16 0029)  multivitamin (PROSIGHT) tablet 1 tablet (not administered)  hydroxypropyl methylcellulose / hypromellose (ISOPTO TEARS / GONIOVISC) 2.5 % ophthalmic solution 1 drop (not administered)  mometasone-formoterol (DULERA) 200-5 MCG/ACT inhaler 2 puff (not administered)  ipratropium-albuterol (DUONEB) 0.5-2.5 (3) MG/3ML nebulizer solution 3 mL (not administered)  sodium chloride 0.9 % bolus 500 mL (0 mLs Intravenous Stopped 07/09/16 2120)  azithromycin (ZITHROMAX) 500 mg in dextrose 5 % 250 mL IVPB (0 mg Intravenous Stopped 07/10/16 0257)  cefTRIAXone (ROCEPHIN) 1 g in dextrose 5 % 50 mL IVPB (0 g Intravenous Stopped 07/09/16 2250)  aspirin chewable tablet 324 mg (324 mg Oral Given 07/10/16 0005)     Initial Impression / Assessment and Plan / ED Course  I have reviewed the triage vital signs and the nursing notes.  Pertinent labs & imaging results that were available during my care of the patient were reviewed by me and considered in my medical decision making (see chart for details).  Clinical Course   Patient found to have pneumonia on chest x-ray with white count of 20. Troponin bumped at 0.21. Code sepsis initiated and patient was given fluid resuscitation  with Rocephin and azithromycin for coverage of community acquired pneumonia. Given chest pain is only made worse with inspiration and cough feel its unlikely related to a primary blockage of a coronary vessel. Her family was updated on the findings and she was admitted to the hospitalist in fair condition.  Final Clinical Impressions(s) / ED Diagnoses   Final diagnoses:  Community acquired pneumonia  Elevated troponin    New Prescriptions Current Discharge Medication List       Levora Angel, MD 07/10/16 1610    Blane Ohara, MD 07/18/16 [phone]

## 2016-07-09 NOTE — ED Notes (Signed)
Paged Code Sepsis @ 2009

## 2016-07-10 ENCOUNTER — Inpatient Hospital Stay (HOSPITAL_COMMUNITY): Payer: Medicare Other

## 2016-07-10 ENCOUNTER — Ambulatory Visit: Payer: Federal, State, Local not specified - PPO | Admitting: Internal Medicine

## 2016-07-10 ENCOUNTER — Encounter (HOSPITAL_COMMUNITY): Payer: Self-pay | Admitting: General Practice

## 2016-07-10 DIAGNOSIS — R079 Chest pain, unspecified: Secondary | ICD-10-CM

## 2016-07-10 DIAGNOSIS — E871 Hypo-osmolality and hyponatremia: Secondary | ICD-10-CM

## 2016-07-10 DIAGNOSIS — J189 Pneumonia, unspecified organism: Secondary | ICD-10-CM

## 2016-07-10 HISTORY — DX: Pneumonia, unspecified organism: J18.9

## 2016-07-10 LAB — ECHOCARDIOGRAM COMPLETE
HEIGHTINCHES: 65 in
Weight: 1588.8 oz

## 2016-07-10 LAB — COMPREHENSIVE METABOLIC PANEL
ALBUMIN: 2.4 g/dL — AB (ref 3.5–5.0)
ALT: 10 U/L — ABNORMAL LOW (ref 14–54)
ANION GAP: 5 (ref 5–15)
AST: 14 U/L — AB (ref 15–41)
Alkaline Phosphatase: 82 U/L (ref 38–126)
BUN: 26 mg/dL — ABNORMAL HIGH (ref 6–20)
CHLORIDE: 98 mmol/L — AB (ref 101–111)
CO2: 26 mmol/L (ref 22–32)
Calcium: 8.6 mg/dL — ABNORMAL LOW (ref 8.9–10.3)
Creatinine, Ser: 0.82 mg/dL (ref 0.44–1.00)
GFR calc Af Amer: >60 mL/min (ref >60)
GFR calc non Af Amer: >60 mL/min (ref >60)
GLUCOSE: 83 mg/dL (ref 65–99)
POTASSIUM: 4.5 mmol/L (ref 3.5–5.1)
SODIUM: 129 mmol/L — AB (ref 135–145)
Total Bilirubin: 0.3 mg/dL (ref 0.3–1.2)
Total Protein: 5.2 g/dL — ABNORMAL LOW (ref 6.5–8.1)

## 2016-07-10 LAB — CBC WITH DIFFERENTIAL/PLATELET
BASOS ABS: 0 10*3/uL (ref 0.0–0.1)
BASOS PCT: 0 %
EOS ABS: 0.1 10*3/uL (ref 0.0–0.7)
Eosinophils Relative: 1 %
HCT: 29.1 % — ABNORMAL LOW (ref 36.0–46.0)
Hemoglobin: 9.7 g/dL — ABNORMAL LOW (ref 12.0–15.0)
LYMPHS ABS: 1.2 10*3/uL (ref 0.7–4.0)
LYMPHS PCT: 8 %
MCH: 31.2 pg (ref 26.0–34.0)
MCHC: 33.3 g/dL (ref 30.0–36.0)
MCV: 93.6 fL (ref 78.0–100.0)
Monocytes Absolute: 1 10*3/uL (ref 0.1–1.0)
Monocytes Relative: 7 %
NEUTROS PCT: 84 %
Neutro Abs: 12.7 10*3/uL — ABNORMAL HIGH (ref 1.7–7.7)
PLATELETS: 233 10*3/uL (ref 150–400)
RBC: 3.11 MIL/uL — ABNORMAL LOW (ref 3.87–5.11)
RDW: 14.2 % (ref 11.5–15.5)
WBC: 15.1 10*3/uL — AB (ref 4.0–10.5)

## 2016-07-10 LAB — TROPONIN I
TROPONIN I: 0.1 ng/mL — AB (ref <0.03)
TROPONIN I: 0.03 ng/mL — AB (ref <0.03)
Troponin I: 0.22 ng/mL (ref <0.03)

## 2016-07-10 LAB — STREP PNEUMONIAE URINARY ANTIGEN: Strep Pneumo Urinary Antigen: NEGATIVE

## 2016-07-10 LAB — HIV ANTIBODY (ROUTINE TESTING W REFLEX): HIV SCREEN 4TH GENERATION: NONREACTIVE

## 2016-07-10 MED ORDER — GUAIFENESIN-DM 100-10 MG/5ML PO SYRP
5.0000 mL | ORAL_SOLUTION | ORAL | Status: DC | PRN
  Administered 2016-07-10 – 2016-07-12 (×4): 5 mL via ORAL
  Filled 2016-07-10 (×4): qty 5

## 2016-07-10 NOTE — Progress Notes (Signed)
  Echocardiogram 2D Echocardiogram has been performed.  Sharon [name] 07/10/2016, 10:55 AM

## 2016-07-10 NOTE — Progress Notes (Signed)
PROGRESS NOTE    Sharon [name]  ZOX:[id number] DOB: 23-Dec-1922 DOA: 07/09/2016 PCP: Rogelia Boga, MD   Brief Narrative:  Sharon [name] is a 82 year old female with a past medical history of chronic obstructive pulmonary disease, admitted to the medicine service on 07/09/2016 when she presented with complaints of generalized weakness, malaise, fevers, pleuritic type chest pain, with initial workup including chest x-ray that showed a left upper lobe airspace consolidation consistent with pneumonia. Last revealed a white count of 20,000. She was started on empiric IV antibiotic therapy with ceftriaxone and azithromycin.    Assessment & Plan:   Principal Problem:   CAP (community acquired pneumonia) Active Problems:   Depression   Community acquired pneumonia   COPD (chronic obstructive pulmonary disease) with emphysema (HCC)   Sepsis (HCC)   Acute respiratory failure with hypoxia (HCC)   Hyponatremia   1.  Community acquire pneumonia -Sharon [name] is a 82 year old female currently residing in the community with her son and daughter-in-law, presented with clinical signs and symptoms suggestive of pneumonia. She had a chest x-ray in the emergency room that didn't fact reveal a left upper lobe infiltrate. -She was started on empiric IV antibiotic therapy with ceftriaxone and azithromycin  -A.m. lab work showing downward trend in white count from 20,000-15,100. she had a lactic acid of 1.08. -Blood cultures obtained on admission are pending -Continue supportive care.  2.  Elevated troponin. -Lab showing troponin of 0.1 and 0.2, which I suspect likely reflects demand ischemia -She presents with community-acquired pneumonia, and reported chest pain more consistent with pleuritic type. -Telemetry stable, showing sinus rhythm -EKG on presentation also showing sinus rhythm, no acute ischemic changes. -We'll continue to monitor.  3.  Chronic obstructive pulmonary disease -She did not  have cervical wheezing on exam, breathing comfortably with supplemental oxygen via nasal cannula -Continue nebulizer treatments as needed  4.  Hyponatremia -Suspect she has chronic hyponatremia, lab work in 2016 showed sodium fluctuated between 128 and 133 -There could also be dehydration component -Provide gentle IV fluid hydration   DVT prophylaxis: Lovenox Code Status: Full code Family Communication:  Disposition Plan:   Consultants:     Procedures:     Antimicrobials:   Ceftriaxone started on 07/09/2016  Azithromycin started on 07/09/2016   Subjective: She reports feeling a little better today, overall think she is doing well  Objective: Vitals:   07/09/16 2230 07/09/16 2300 07/09/16 2345 07/10/16 0409  BP: 122/56 114/58 (!) 111/50 (!) 93/40  Pulse: 70 71 75 60  Resp: (!) 21  Temp:   98.5 F (36.9 C) 98.6 F (37 C)  TempSrc:   Oral Oral  SpO2: 94% 94% 97% 95%  Weight:   45 kg (99 lb 4.8 oz)   Height:       No intake or output data in the 24 hours ending 07/10/16 1149 Filed Weights   07/09/16 2021 07/09/16 2345  Weight: 44.2 kg (97 lb 7.1 oz) 45 kg (99 lb 4.8 oz)    Examination:  General exam: He is awake and alert oriented, nontoxic appearing, responding appropriately to questions Respiratory system: She left-sided rhonchi and crackles, scattered expiratory wheezes Cardiovascular system: S1 & S2 heard, RRR. No JVD, murmurs, rubs, gallops or clicks. No pedal edema. Gastrointestinal system: Abdomen is nondistended, soft and nontender. No organomegaly or masses felt. Normal bowel sounds heard. Central nervous system: Alert and oriented. No focal neurological deficits. Extremities: Symmetric 5 x 5 power. Skin: No rashes, lesions or  ulcers Psychiatry: Judgement and insight appear normal. Mood & affect appropriate.     Data Reviewed: I have personally reviewed following labs and imaging studies  CBC:  Recent Labs Lab 07/09/16 1942  07/10/16 0505  WBC 20.0* 15.1*  NEUTROABS  --  12.7*  HGB 11.9* 9.7*  HCT 34.9* 29.1*  MCV 92.6 93.6  PLT 267 233   Basic Metabolic Panel:  Recent Labs Lab 07/09/16 1942 07/10/16 0505  NA 128* 129*  K 4.3 4.5  CL 91* 98*  CO2 23 26  GLUCOSE 100* 83  BUN 27* 26*  CREATININE 0.78 0.82  CALCIUM 9.1 8.6*   GFR: Estimated Creatinine Clearance: 31.7 mL/min (by C-G formula based on SCr of 0.82 mg/dL). Liver Function Tests:  Recent Labs Lab 07/10/16 0505  AST 14*  ALT 10*  ALKPHOS 82  BILITOT 0.3  PROT 5.2*  ALBUMIN 2.4*   No results for input(s): LIPASE, AMYLASE in the last 168 hours. No results for input(s): AMMONIA in the last 168 hours. Coagulation Profile: No results for input(s): INR, PROTIME in the last 168 hours. Cardiac Enzymes:  Recent Labs Lab 07/09/16 2310 07/10/16 0505  TROPONINI 0.10* 0.22*   BNP (last 3 results) No results for input(s): PROBNP in the last 8760 hours. HbA1C: No results for input(s): HGBA1C in the last 72 hours. CBG: No results for input(s): GLUCAP in the last 168 hours. Lipid Profile: No results for input(s): CHOL, HDL, LDLCALC, TRIG, CHOLHDL, LDLDIRECT in the last 72 hours. Thyroid Function Tests: No results for input(s): TSH, T4TOTAL, FREET4, T3FREE, THYROIDAB in the last 72 hours. Anemia Panel: No results for input(s): VITAMINB12, FOLATE, FERRITIN, TIBC, IRON, RETICCTPCT in the last 72 hours. Sepsis Labs:  Recent Labs Lab 07/09/16 2019 07/09/16 2300  LATICACIDVEN 1.03 1.08    No results found for this or any previous visit (from the past 240 hour(s)).       Radiology Studies: Dg Chest 2 View  Result Date: 07/09/2016 CLINICAL DATA:  Left-sided chest pain. Shortness of breath. History of COPD. EXAM: CHEST  2 VIEW COMPARISON:  PA and lateral chest 06/29/2010. Single-view of the chest 07/02/2015. FINDINGS: The chest is markedly hyperexpanded. Airspace disease identified in the left upper lobe. The right lung is  clear. Heart size is normal. No pneumothorax or pleural effusion. Aortic atherosclerosis is noted. IMPRESSION: Left upper lobe airspace disease most consistent with pneumonia. Recommend followup to clearing. Emphysema. Atherosclerosis. Electronically Signed   By: Drusilla Kanner M.D.   On: 07/09/2016 19:55       Scheduled Meds: . aspirin EC  81 mg Oral Daily  . atorvastatin  40 mg Oral q1800  . azithromycin  500 mg Intravenous Q24H  . calcium-vitamin D  1 tablet Oral Q breakfast  . cefTRIAXone (ROCEPHIN)  IV  1 g Intravenous Q24H  . enoxaparin (LOVENOX) injection  20 mg Subcutaneous Q24H  . escitalopram  5 mg Oral Daily  . mirtazapine  15 mg Oral QHS  . mometasone-formoterol  2 puff Inhalation BID  . multivitamin  1 tablet Oral BID   Continuous Infusions:    LOS: 1 day    Time spent: 35 min    Jeralyn Bennett, MD [facility] Pager [phone]  If 7PM-7AM, please contact night-coverage [email or web address] Password [facility] 07/10/2016, 11:49 AM

## 2016-07-10 NOTE — Progress Notes (Signed)
Utilization review completed.  

## 2016-07-11 DIAGNOSIS — R7989 Other specified abnormal findings of blood chemistry: Secondary | ICD-10-CM

## 2016-07-11 NOTE — Progress Notes (Signed)
PROGRESS NOTE    ARACELYS GLADE  ZOX:[id number] DOB: 26-Feb-1923 DOA: 07/09/2016 PCP: Rogelia Boga, MD   Brief Narrative:  Mrs Roe is a 82 year old female with a past medical history of chronic obstructive pulmonary disease, admitted to the medicine service on 07/09/2016 when she presented with complaints of generalized weakness, malaise, fevers, pleuritic type chest pain, with initial workup including chest x-ray that showed a left upper lobe airspace consolidation consistent with pneumonia. Last revealed a white count of 20,000. She was started on empiric IV antibiotic therapy with ceftriaxone and azithromycin.    Assessment & Plan:   Principal Problem:   CAP (community acquired pneumonia) Active Problems:   Depression   Community acquired pneumonia   COPD (chronic obstructive pulmonary disease) with emphysema (HCC)   Sepsis (HCC)   Acute respiratory failure with hypoxia (HCC)   Hyponatremia   1.  Community acquire pneumonia -Mrs Felber is a 82 year old female currently residing in the community with her son and daughter-in-law, presented with clinical signs and symptoms suggestive of pneumonia. She had a chest x-ray in the emergency room that did in fact reveal a left upper lobe infiltrate. -She was started on empiric IV antibiotic therapy with ceftriaxone and azithromycin  -A.m. lab work showing downward trend in white count from 20,000-15,100. she had a lactic acid of 1.08. -Blood cultures obtained on 07/09/2016 showing no growth to date -Repeat CXR, CBC and BMP in am -Continue supportive care.  2.  Elevated troponin likely secondary to demand ischemia. -Lab showing troponin of 0.1, 0.2 and 0.03, which I suspect likely reflects demand ischemia -She presents with community-acquired pneumonia, and reported chest pain more consistent with pleuritic type. -Telemetry stable, showing sinus rhythm -EKG on presentation also showing sinus rhythm, no acute ischemic  changes. -2D Echo performed on 07/10/2016 showed EF of 60-65% without wall motion abnormalities.   3.  Chronic obstructive pulmonary disease -She did not have cervical wheezing on exam, breathing comfortably with supplemental oxygen via nasal cannula -Continue nebulizer treatments as needed  4.  Hyponatremia -Suspect she has chronic hyponatremia, lab work in 2016 showed sodium fluctuated between 128 and 133 -There could also be dehydration component -Provide gentle IV fluid hydration -Repeat BMP in am   DVT prophylaxis: Lovenox Code Status: Full code Family Communication: I spoke to her son and daughter-in-law on 07/11/2016 Disposition Plan: Anticipate d/c in the next 24-48 hours if she continues to show clinical improvment  Consultants:     Procedures:     Antimicrobials:   Ceftriaxone started on 07/09/2016  Azithromycin started on 07/09/2016   Subjective: She is in good spirits, says she feels much better, denies CP, N/V, SOB  Objective: Vitals:   07/10/16 2134 07/11/16 0432 07/11/16 0900 07/11/16 1051  BP:  (!) 141/60  (!) 161/70  Pulse: 78 69 69 76  Resp: 16 18 18 18   Temp:  97.3 F (36.3 C)  97.5 F (36.4 C)  TempSrc:  Oral  Oral  SpO2: 92% 98% 92% 92%  Weight:  45.2 kg (99 lb 11.2 oz)    Height:        Intake/Output Summary (Last 24 hours) at 07/11/16 1053 Last data filed at 07/11/16 0700  Gross per 24 hour  Intake              420 ml  Output             1050 ml  Net             -  630 ml   Filed Weights   07/09/16 2021 07/09/16 2345 07/11/16 0432  Weight: 44.2 kg (97 lb 7.1 oz) 45 kg (99 lb 4.8 oz) 45.2 kg (99 lb 11.2 oz)    Examination:  General exam: He is awake and alert oriented, nontoxic appearing, responding appropriately to questions Respiratory system: She left-sided rhonchi and crackles, scattered expiratory wheezes Cardiovascular system: S1 & S2 heard, RRR. No JVD, murmurs, rubs, gallops or clicks. No pedal edema. Gastrointestinal  system: Abdomen is nondistended, soft and nontender. No organomegaly or masses felt. Normal bowel sounds heard. Central nervous system: Alert and oriented. No focal neurological deficits. Extremities: Symmetric 5 x 5 power. Skin: No rashes, lesions or ulcers Psychiatry: Judgement and insight appear normal. Mood & affect appropriate.     Data Reviewed: I have personally reviewed following labs and imaging studies  CBC:  Recent Labs Lab 07/09/16 1942 07/10/16 0505  WBC 20.0* 15.1*  NEUTROABS  --  12.7*  HGB 11.9* 9.7*  HCT 34.9* 29.1*  MCV 92.6 93.6  PLT 267 233   Basic Metabolic Panel:  Recent Labs Lab 07/09/16 1942 07/10/16 0505  NA 128* 129*  K 4.3 4.5  CL 91* 98*  CO2 23 26  GLUCOSE 100* 83  BUN 27* 26*  CREATININE 0.78 0.82  CALCIUM 9.1 8.6*   GFR: Estimated Creatinine Clearance: 31.9 mL/min (by C-G formula based on SCr of 0.82 mg/dL). Liver Function Tests:  Recent Labs Lab 07/10/16 0505  AST 14*  ALT 10*  ALKPHOS 82  BILITOT 0.3  PROT 5.2*  ALBUMIN 2.4*   No results for input(s): LIPASE, AMYLASE in the last 168 hours. No results for input(s): AMMONIA in the last 168 hours. Coagulation Profile: No results for input(s): INR, PROTIME in the last 168 hours. Cardiac Enzymes:  Recent Labs Lab 07/09/16 2310 07/10/16 0505 07/10/16 1155  TROPONINI 0.10* 0.22* 0.03*   BNP (last 3 results) No results for input(s): PROBNP in the last 8760 hours. HbA1C: No results for input(s): HGBA1C in the last 72 hours. CBG: No results for input(s): GLUCAP in the last 168 hours. Lipid Profile: No results for input(s): CHOL, HDL, LDLCALC, TRIG, CHOLHDL, LDLDIRECT in the last 72 hours. Thyroid Function Tests: No results for input(s): TSH, T4TOTAL, FREET4, T3FREE, THYROIDAB in the last 72 hours. Anemia Panel: No results for input(s): VITAMINB12, FOLATE, FERRITIN, TIBC, IRON, RETICCTPCT in the last 72 hours. Sepsis Labs:  Recent Labs Lab 07/09/16 2019  07/09/16 2300  LATICACIDVEN 1.03 1.08    Recent Results (from the past 240 hour(s))  Blood Culture (routine x 2)     Status: None (Preliminary result)   Collection Time: 07/09/16  8:10 PM  Result Value Ref Range Status   Specimen Description BLOOD RIGHT ANTECUBITAL  Final   Special Requests BOTTLES DRAWN AEROBIC AND ANAEROBIC 5CC   Final   Culture NO GROWTH < 24 HOURS  Final   Report Status PENDING  Incomplete  Blood Culture (routine x 2)     Status: None (Preliminary result)   Collection Time: 07/09/16  8:13 PM  Result Value Ref Range Status   Specimen Description BLOOD LEFT ARM  Final   Special Requests BOTTLES DRAWN AEROBIC AND ANAEROBIC 5CC   Final   Culture NO GROWTH < 24 HOURS  Final   Report Status PENDING  Incomplete         Radiology Studies: Dg Chest 2 View  Result Date: 07/09/2016 CLINICAL DATA:  Left-sided chest pain. Shortness of breath. History of  COPD. EXAM: CHEST  2 VIEW COMPARISON:  PA and lateral chest 06/29/2010. Single-view of the chest 07/02/2015. FINDINGS: The chest is markedly hyperexpanded. Airspace disease identified in the left upper lobe. The right lung is clear. Heart size is normal. No pneumothorax or pleural effusion. Aortic atherosclerosis is noted. IMPRESSION: Left upper lobe airspace disease most consistent with pneumonia. Recommend followup to clearing. Emphysema. Atherosclerosis. Electronically Signed   By: Drusilla Kanner M.D.   On: 07/09/2016 19:55       Scheduled Meds: . aspirin EC  81 mg Oral Daily  . atorvastatin  40 mg Oral q1800  . azithromycin  500 mg Intravenous Q24H  . calcium-vitamin D  1 tablet Oral Q breakfast  . cefTRIAXone (ROCEPHIN)  IV  1 g Intravenous Q24H  . enoxaparin (LOVENOX) injection  20 mg Subcutaneous Q24H  . escitalopram  5 mg Oral Daily  . mirtazapine  15 mg Oral QHS  . mometasone-formoterol  2 puff Inhalation BID  . multivitamin  1 tablet Oral BID   Continuous Infusions:    LOS: 2 days    Time spent:  35 min    Jeralyn Bennett, MD [facility] Pager [phone]  If 7PM-7AM, please contact night-coverage [email or web address] Password [facility] 07/11/2016, 10:53 AM

## 2016-07-11 NOTE — Progress Notes (Signed)
Patient ambulated to the bathroom four times on this shift, patient encouraged to ambulate in the hallway, but patient refused, will continue to monitor

## 2016-07-12 DIAGNOSIS — J9600 Acute respiratory failure, unspecified whether with hypoxia or hypercapnia: Secondary | ICD-10-CM

## 2016-07-12 LAB — LEGIONELLA PNEUMOPHILA SEROGP 1 UR AG: L. PNEUMOPHILA SEROGP 1 UR AG: NEGATIVE

## 2016-07-12 LAB — BASIC METABOLIC PANEL
ANION GAP: 5 (ref 5–15)
BUN: 7 mg/dL (ref 6–20)
CALCIUM: 8.7 mg/dL — AB (ref 8.9–10.3)
CO2: 27 mmol/L (ref 22–32)
CREATININE: 0.57 mg/dL (ref 0.44–1.00)
Chloride: 101 mmol/L (ref 101–111)
GFR calc non Af Amer: >60 mL/min (ref >60)
GLUCOSE: 91 mg/dL (ref 65–99)
POTASSIUM: 3.7 mmol/L (ref 3.5–5.1)
Sodium: 133 mmol/L — ABNORMAL LOW (ref 135–145)

## 2016-07-12 LAB — CBC
HCT: 34.1 % — ABNORMAL LOW (ref 36.0–46.0)
Hemoglobin: 11.1 g/dL — ABNORMAL LOW (ref 12.0–15.0)
MCH: 30.6 pg (ref 26.0–34.0)
MCHC: 32.6 g/dL (ref 30.0–36.0)
MCV: 93.9 fL (ref 78.0–100.0)
PLATELETS: 320 10*3/uL (ref 150–400)
RBC: 3.63 MIL/uL — ABNORMAL LOW (ref 3.87–5.11)
RDW: 13.8 % (ref 11.5–15.5)
WBC: 8.3 10*3/uL (ref 4.0–10.5)

## 2016-07-12 MED ORDER — LEVOFLOXACIN 500 MG PO TABS: 500.0000 mg | ORAL_TABLET | Freq: Every day | ORAL | 0 refills | Status: AC

## 2016-07-12 NOTE — Discharge Summary (Signed)
Physician Discharge Summary  LISSET KETCHEM ZOX:[id number] DOB: November 09, 1923 DOA: 07/09/2016  PCP: Rogelia Boga, MD  Admit date: 07/09/2016 Discharge date: 07/12/2016  Time spent: 35 minutes  Recommendations for Outpatient Follow-up:  Repeat BMET to follow up on electrolytes and renal function trend Please repeat CBC to follow WBC's trend/stability Repeat CXR in 3-4 weeks to ensure resolution of infiltrates Reassess/weaned oxygen supplementation as needed   Discharge Diagnoses:  Principal Problem:   CAP (community acquired pneumonia) Active Problems:   Depression   Community acquired pneumonia   COPD (chronic obstructive pulmonary disease) with emphysema (HCC)   Sepsis (HCC)   Acute respiratory failure with hypoxia (HCC)   Hyponatremia   Discharge Condition: stable and improved. Discharge home with [facility] and arrangements for Home oxygen. Will follow up with PCP in 10 days.  Diet recommendation: heart healthy diet   Filed Weights   07/09/16 2345 07/11/16 0432 07/12/16 0223  Weight: 45 kg (99 lb 4.8 oz) 45.2 kg (99 lb 11.2 oz) 44.5 kg (98 lb)    History of present illness:  As per Dr. Antionette Char H&P written on 07/09/16 82 y.o. female with medical history significant for COPD and esophageal stricture status post dilation who presents the emergency department with 3 days of lethargy and loss of appetite with 1 day of fever, chills, and soreness in the chest and back on deep inspiration or coughing. The patient reports that she had been in her usual state of health while traveling in [place] in [place] with her family. Towards the end of the trip, she developed some hoarseness and a mild sore throat which has improved and nearly resolved by the time of their arrival back home on 07/06/2016. The day after their return home, the patient slept most of the day and had no appetite. Initially, her family attributed this to likely jetlagged and did not think too much of it. Over the ensuing  days, however, she continued to be sleeping much more than usual and her appetite had not picked up. Today, the patient had subjective fevers and was noted to have rigors by her family. She describes the chest and back pain as soreness, localized to the mid chest and mid back, only noticeable with deep inspiration or cough, and alleviated while at rest. She denies palpitations, lower extremity swelling, orthopnea, or hemoptysis. There is been no personal or family history of VTE. Patient suffered a mechanical fall while in [place] and had subsequent right lower extremity pain that was evaluated there with radiographs and venous Doppler, both reportedly negative. Leg pain has resolved. Patient denies any history of coronary artery disease or angina. She does not require supplemental oxygen at her baseline.  Hospital Course:   1. Sepsis and hypoxia due to Community acquire pneumonia -Mrs Shipp is a 82 year old female currently residing in the community with her son and daughter-in-law, who presented with clinical signs and symptoms suggestive of pneumonia. She had a chest x-ray in the emergency room that did in fact reveal a left upper lobe infiltrate. -She was started on empiric IV antibiotic therapy with ceftriaxone and azithromycin  -afebrile at discharge, WBC's WNL and her RR also WNL -sepsis features resolved at discharge -Blood cultures obtained on 07/09/2016 and w/o any growth up to date -will discharge on levaquin to complete antibiotic therapy at discharge -require oxygen supplementation to assure O2 sat above 90%, will wean as an outpatient as tolerated.  2.  Elevated troponin likely secondary to demand ischemia. -Lab showing troponin of 0.1,  0.2 and 0.03, which was suspected to be secondary to demand ischemia -She presented with community-acquired pneumonia, and reported initial chest pain was pleuritic in nature and associated with coughing. -Telemetry and EKG didn't show any acute ischemic  changes  -2D Echo performed on 07/10/2016 showed EF of 60-65% without wall motion abnormalities (which is reassurance).  -no chest pain at discharge  3.  Chronic obstructive pulmonary disease -no wheezing and good air movement  -Continue nebulizer treatments as needed and daily symbicort  4.  Hyponatremia -Suspected to have mild degree of chronic hyponatremia, lab work in 2016 showed sodium fluctuated between 128 and 133 -There could also be dehydration component and to be secondary from acute lung infection  -Patient received gentle IV fluid hydration and at discharge her sodium level was 133 -Repeat BMP during follow up visit recommended, to assess electrolytes trend   5.  Depression and anxiety -continue home anxiolytic and antidepressant regimen -no hallucinations, no SI and stable mood at discharge   Procedures:  See below for x-ray reports.  2-D echo. 07/10/16 - Left ventricle: The cavity size was normal. Wall thickness was   normal. Systolic function was normal. The estimated ejection   fraction was in the range of 60% to 65%. Wall motion was normal;   there were no regional wall motion abnormalities. Left   ventricular diastolic function parameters were normal. Doppler   parameters are consistent with both elevated ventricular   end-diastolic filling pressure and elevated left atrial filling   pressure. - Atrial septum: No defect or patent foramen ovale was identified. - Pulmonary arteries: PA peak pressure: 52 mm Hg (S).  Consultations:  None   Discharge Exam: Vitals:   07/12/16 1019 07/12/16 1414  BP: (!) 160/55 (!) 151/80  Pulse: 82 81  Resp: 20 20  Temp: 98.3 F (36.8 C)       Discharge Instructions   Discharge Instructions    Diet - low sodium heart healthy    Complete by:  As directed   Discharge instructions    Complete by:  As directed   Keep yourself well hydrated Take medications as prescribed Arrange follow up with PCP in 10 days Follow  heart healthy/low sodium diet   Increase activity slowly    Complete by:  As directed     Current Discharge Medication List    START taking these medications   Details  levofloxacin (LEVAQUIN) 500 MG tablet Take 1 tablet (500 mg total) by mouth daily. Qty: 5 tablet, Refills: 0      CONTINUE these medications which have NOT CHANGED   Details  ALPRAZolam (XANAX) 0.25 MG tablet Take 1 tablet (0.25 mg total) by mouth 3 (three) times daily as needed. Qty: 90 tablet, Refills: 2    Calcium Carbonate-Vitamin D (CALTRATE 600+D) 600-400 MG-UNIT per chew tablet Chew 1 tablet by mouth every morning.     cyanocobalamin (,VITAMIN B-12,) 1000 MCG/ML injection INJECT 1 ML IN THE MUSCLE ONCE MONTHLY Qty: 10 mL, Refills: 9    escitalopram (LEXAPRO) 5 MG tablet TAKE 1 TABLET BY MOUTH DAILY Qty: 90 tablet, Refills: 1    Ipratropium-Albuterol (COMBIVENT) 20-100 MCG/ACT AERS respimat Inhale 1 puff into the lungs every 6 (six) hours as needed for wheezing or shortness of breath. Qty: 1 Inhaler, Refills: 5    mirtazapine (REMERON) 15 MG tablet TAKE 1 TABLET BY MOUTH EVERY NIGHT AT BEDTIME Qty: 90 tablet, Refills: 0    Multiple Vitamins-Minerals (ICAPS AREDS 2) CAPS Take 1 capsule  by mouth 2 (two) times daily.    naproxen sodium (ANAPROX) 220 MG tablet Take 220-440 mg by mouth 2 (two) times daily as needed (pain).     Polyethyl Glycol-Propyl Glycol (SYSTANE OP) Place 1 drop into both eyes 4 (four) times daily as needed (for dry eyes).     SYMBICORT 160-4.5 MCG/ACT inhaler INHALE 2 PUFFS BY MOUTH TWICE DAILY. Qty: 10.2 g, Refills: 6    traMADol (ULTRAM) 50 MG tablet Take 50 mg by mouth every 12 (twelve) hours as needed for moderate pain.       Allergies  Allergen Reactions  . Penicillins Rash    Has patient had a PCN reaction causing immediate rash, facial/tongue/throat swelling, SOB or lightheadedness with hypotension: Yes Has patient had a PCN reaction causing severe rash involving mucus  membranes or skin necrosis: Unknown Has patient had a PCN reaction that required hospitalization: Unknown Has patient had a PCN reaction occurring within the last 10 years: No If all of the above answers are "NO", then may proceed with Cephalosporin use.    Follow-up Information    Rogelia Boga, MD. Schedule an appointment as soon as possible for a visit in 10 day(s).   Specialty:  Internal Medicine Contact information: [address] [place] [address] [phone]           The results of significant diagnostics from this hospitalization (including imaging, microbiology, ancillary and laboratory) are listed below for reference.    Significant Diagnostic Studies: Dg Chest 2 View  Result Date: 07/09/2016 CLINICAL DATA:  Left-sided chest pain. Shortness of breath. History of COPD. EXAM: CHEST  2 VIEW COMPARISON:  PA and lateral chest 06/29/2010. Single-view of the chest 07/02/2015. FINDINGS: The chest is markedly hyperexpanded. Airspace disease identified in the left upper lobe. The right lung is clear. Heart size is normal. No pneumothorax or pleural effusion. Aortic atherosclerosis is noted. IMPRESSION: Left upper lobe airspace disease most consistent with pneumonia. Recommend followup to clearing. Emphysema. Atherosclerosis. Electronically Signed   By: Drusilla Kanner M.D.   On: 07/09/2016 19:55   Microbiology: Recent Results (from the past 240 hour(s))  Blood Culture (routine x 2)     Status: None (Preliminary result)   Collection Time: 07/09/16  8:10 PM  Result Value Ref Range Status   Specimen Description BLOOD RIGHT ANTECUBITAL  Final   Special Requests BOTTLES DRAWN AEROBIC AND ANAEROBIC 5CC   Final   Culture NO GROWTH 2 DAYS  Final   Report Status PENDING  Incomplete  Blood Culture (routine x 2)     Status: None (Preliminary result)   Collection Time: 07/09/16  8:13 PM  Result Value Ref Range Status   Specimen Description BLOOD LEFT ARM  Final    Special Requests BOTTLES DRAWN AEROBIC AND ANAEROBIC 5CC   Final   Culture NO GROWTH 2 DAYS  Final   Report Status PENDING  Incomplete     Labs: Basic Metabolic Panel:  Recent Labs Lab 07/09/16 1942 07/10/16 0505 07/12/16 0254  NA 128* 129* 133*  K 4.3 4.5 3.7  CL 91* 98* 101  CO2 23 26 27   GLUCOSE 100* 83 91  BUN 27* 26* 7  CREATININE 0.78 0.82 0.57  CALCIUM 9.1 8.6* 8.7*   Liver Function Tests:  Recent Labs Lab 07/10/16 0505  AST 14*  ALT 10*  ALKPHOS 82  BILITOT 0.3  PROT 5.2*  ALBUMIN 2.4*   CBC:  Recent Labs Lab 07/09/16 1942 07/10/16 0505 07/12/16 0254  WBC  20.0* 15.1* 8.3  NEUTROABS  --  12.7*  --   HGB 11.9* 9.7* 11.1*  HCT 34.9* 29.1* 34.1*  MCV 92.6 93.6 93.9  PLT 267 233 320   Cardiac Enzymes:  Recent Labs Lab 07/09/16 2310 07/10/16 0505 07/10/16 1155  TROPONINI 0.10* 0.22* 0.03*    Signed:  Vassie Loll MD.  [facility] 07/12/2016, 2:59 PM

## 2016-07-12 NOTE — Care Management Note (Signed)
Case Management Note Donn Pierini RN, BSN Unit 2W-Case Manager [phone]  Patient Details  Name: Sharon [name] MRN: [id number] Date of Birth: January 26, 1923  Subjective/Objective:   Pt admitted with PNA                 Action/Plan: PTA pt lived at home with family, will return home with family- order for [facility], and will need home 55- spoke with pt and family at bedside- list provided for [facility] for [facility] choice- per pt and family would like to use [facility] for services- [facility] and DME- referral called to Darl Pikes with [facility] for [facility], also made call to [facility] with [facility] for home 02 needs- will deliver portable tank to room prior to discharge.   Expected Discharge Date:     07/12/16             Expected Discharge Plan:  Home w Home Health Services  In-House Referral:     Discharge planning Services  CM Consult  Post Acute Care Choice:  Durable Medical Equipment, Home Health Choice offered to:  Patient, Adult Children  DME Arranged:  Oxygen DME Agency:  [facility]  HH Arranged:  RN [facility] Agency:  [facility]  Status of Service:  Completed, signed off  If discussed at Long Length of Stay Meetings, dates discussed:    Additional Comments:  Darrold Span, RN 07/12/2016, 3:36 PM

## 2016-07-12 NOTE — Progress Notes (Signed)
SATURATION QUALIFICATIONS: (This note is used to comply with regulatory documentation for home oxygen)  Patient Saturations on Room Air at Rest 84 %   

## 2016-07-12 NOTE — Progress Notes (Signed)
Pre ambulation patient's O2 was 84% on RA, patient was taught how to do pursed lip breathing and her O2 sat increased to 93% while sitting on RA. On ambulation (14ft)  it dropped to 84%  And patient was put on O2 4L which increased her O2 sat to 96%. Patient returned back to the room, sitting quietly in bed, call light within reach , will continue to monitor

## 2016-07-12 NOTE — Progress Notes (Addendum)
Patient in stable condition, this RN went over discharge teachings with patient and son at bedside, they verbalised understanding, iv removed, tele dc ccmd notified, patient belongings at bedside, oxygen tank at bedside, patient taken off the unit on a wheelchair by a NT

## 2016-07-12 NOTE — Discharge Instructions (Signed)

## 2016-07-12 NOTE — Care Management Important Message (Signed)
Important Message  Patient Details  Name: Sharon [name] MRN: [id number] Date of Birth: February 26, 1923   Medicare Important Message Given:  Yes    Westin Knotts, Stephan Minister 07/12/2016, 1:10 PM

## 2016-07-14 ENCOUNTER — Other Ambulatory Visit: Payer: Self-pay | Admitting: Internal Medicine

## 2016-07-14 LAB — CULTURE, BLOOD (ROUTINE X 2)
CULTURE: NO GROWTH
Culture: NO GROWTH

## 2016-07-17 ENCOUNTER — Telehealth: Payer: Self-pay | Admitting: Internal Medicine

## 2016-07-17 NOTE — Telephone Encounter (Signed)
Refill.  Levaquin 500 No. 5 one daily

## 2016-07-17 NOTE — Telephone Encounter (Signed)
Daughter in law called to request a sooner appt for pt due to being in the hospital with PNA. Pt was prescribed 5 days of  levofloxacin (LEVAQUIN) 500 MG tablet  Pt would like to know if she should get another refill of the abx because they are concerned pt still has PNA. Pt is coughing up phlegm.  Made ov appointment for pt on Tuesday, 8/1. (tomorrow)    ALSO  Pt request refill  ALPRAZolam (XANAX) 0.25 MG tablet  [facility]/ [address]

## 2016-07-18 ENCOUNTER — Ambulatory Visit (INDEPENDENT_AMBULATORY_CARE_PROVIDER_SITE_OTHER): Payer: Federal, State, Local not specified - PPO | Admitting: Internal Medicine

## 2016-07-18 ENCOUNTER — Encounter: Payer: Self-pay | Admitting: Internal Medicine

## 2016-07-18 ENCOUNTER — Other Ambulatory Visit: Payer: Self-pay

## 2016-07-18 VITALS — BP 120/72 | HR 82 | Temp 97.7°F | Wt 96.8 lb

## 2016-07-18 DIAGNOSIS — J9611 Chronic respiratory failure with hypoxia: Secondary | ICD-10-CM

## 2016-07-18 DIAGNOSIS — J189 Pneumonia, unspecified organism: Secondary | ICD-10-CM | POA: Diagnosis not present

## 2016-07-18 DIAGNOSIS — J441 Chronic obstructive pulmonary disease with (acute) exacerbation: Secondary | ICD-10-CM

## 2016-07-18 DIAGNOSIS — J431 Panlobular emphysema: Secondary | ICD-10-CM

## 2016-07-18 DIAGNOSIS — Z5189 Encounter for other specified aftercare: Secondary | ICD-10-CM

## 2016-07-18 DIAGNOSIS — E871 Hypo-osmolality and hyponatremia: Secondary | ICD-10-CM

## 2016-07-18 MED ORDER — ALPRAZOLAM 0.25 MG PO TABS: 0.2500 mg | ORAL_TABLET | Freq: Three times a day (TID) | ORAL | 2 refills | Status: DC | PRN

## 2016-07-18 MED ORDER — LEVOFLOXACIN 500 MG PO TABS: 500.0000 mg | ORAL_TABLET | Freq: Every day | ORAL | 0 refills | Status: DC

## 2016-07-18 NOTE — Patient Instructions (Addendum)
Limit your sodium (Salt) intake  Return in one month for follow-up    It is important that you exercise regularly.  If you develop chest pain or shortness of breath seek  medical attention.

## 2016-07-18 NOTE — Telephone Encounter (Signed)
Prescription sent to pharmacy.

## 2016-07-18 NOTE — Progress Notes (Signed)
Pre visit review using our clinic review tool, if applicable. No additional management support is needed unless otherwise documented below in the visit note. 

## 2016-07-18 NOTE — Progress Notes (Signed)
Subjective:    Patient ID: Sharon [name], female    DOB: April 04, 1923, 82 y.o.   MRN: [id number]  HPI  Admit date: 07/09/2016 Discharge date: 07/12/2016   Recommendations for Outpatient Follow-up:  Repeat BMET to follow up on electrolytes and renal function trend Please repeat CBC to follow WBC's trend/stability Repeat CXR in 3-4 weeks to ensure resolution of infiltrates Reassess/weaned oxygen supplementation as needed   Discharge Diagnoses:  Principal Problem:   CAP (community acquired pneumonia) Active Problems:   Depression   Community acquired pneumonia   COPD (chronic obstructive pulmonary disease) with emphysema (HCC)   Sepsis (HCC)   Acute respiratory failure with hypoxia (HCC)   Hyponatremia  82 year old patient who is seen today following a recent hospital discharge and for transitional care management. She has been home for about 6 days and continues to improve.  She still has some weakness and poor appetite but this seems to be improving.  Her pulmonary status has been stable She was discharged on home O2 that she uses just at night. Denies any wheezing or shortness of breath Patient has completed antibiotic therapy  Hospital records reviewed  Past Medical History:  Diagnosis Date  . Anemia   . B12 DEFICIENCY 03/31/2008  . COPD 11/27/2008  . ESOPHAGEAL STRICTURE 01/29/2009  . MASS, LUNG 10/13/2009  . NASAL FRACTURE 08/12/2009  . OSTEOARTHRITIS 03/31/2008   Hips  . OSTEOPOROSIS 12/27/2007  . PEDAL EDEMA 12/27/2007  . Pneumonia 07/10/2016  . PULMONARY NODULE 10/21/2009  . RESPIRATORY FAILURE, CHRONIC 06/29/2010  . Shortness of breath dyspnea      Social History   Social History  . Marital status: Widowed    Spouse name: N/A  . Number of children: N/A  . Years of education: N/A   Occupational History  . Not on file.   Social History Main Topics  . Smoking status: Former Smoker    Quit date: 12/18/1978  . Smokeless tobacco: Never Used  . Alcohol use  0.0 oz/week     Comment: couple of gin and tonics daily  . Drug use: No  . Sexual activity: Not on file   Other Topics Concern  . Not on file   Social History Narrative  . No narrative on file    Past Surgical History:  Procedure Laterality Date  . APPENDECTOMY     '38  . BALLOON DILATION N/A 05/10/2015   Procedure: BALLOON DILATION;  Surgeon: Louis Meckel, MD;  Location: [facility];  Service: Endoscopy;  Laterality: N/A;  . BALLOON DILATION N/A 08/02/2015   Procedure: BALLOON DILATION;  Surgeon: Louis Meckel, MD;  Location: [facility];  Service: Endoscopy;  Laterality: N/A;  . BREAST SURGERY     biopsy '47  . CATARACT EXTRACTION Bilateral    last '05  . CHOLECYSTECTOMY     laparoscopic'10  . ESOPHAGOGASTRODUODENOSCOPY     with dilatation x3  . ESOPHAGOGASTRODUODENOSCOPY (EGD) WITH PROPOFOL N/A 05/10/2015   Procedure: ESOPHAGOGASTRODUODENOSCOPY (EGD) WITH PROPOFOL;  Surgeon: Louis Meckel, MD;  Location: [facility];  Service: Endoscopy;  Laterality: N/A;  balloon dilation  . ESOPHAGOGASTRODUODENOSCOPY (EGD) WITH PROPOFOL N/A 08/02/2015   Procedure: ESOPHAGOGASTRODUODENOSCOPY (EGD) WITH PROPOFOL;  Surgeon: Louis Meckel, MD;  Location: [facility];  Service: Endoscopy;  Laterality: N/A;  . LUMBAR LAMINECTOMY    . NASAL RECONSTRUCTION WITH SEPTAL REPAIR     '10  . RECTAL PROLAPSE REPAIR     '06  . skin graf     '98  Family History  Problem Relation Age of Onset  . Hypertension Son     Allergies  Allergen Reactions  . Penicillins Rash    Has patient had a PCN reaction causing immediate rash, facial/tongue/throat swelling, SOB or lightheadedness with hypotension: Yes Has patient had a PCN reaction causing severe rash involving mucus membranes or skin necrosis: Unknown Has patient had a PCN reaction that required hospitalization: Unknown Has patient had a PCN reaction occurring within the last 10 years: No If all of the above answers are "NO", then may  proceed with Cephalosporin use.     Current Outpatient Prescriptions on File Prior to Visit  Medication Sig Dispense Refill  . Calcium Carbonate-Vitamin D (CALTRATE 600+D) 600-400 MG-UNIT per chew tablet Chew 1 tablet by mouth every morning.     . cyanocobalamin (,VITAMIN B-12,) 1000 MCG/ML injection INJECT 1 ML IN THE MUSCLE ONCE MONTHLY 10 mL 9  . escitalopram (LEXAPRO) 5 MG tablet TAKE 1 TABLET BY MOUTH DAILY 90 tablet 1  . Ipratropium-Albuterol (COMBIVENT) 20-100 MCG/ACT AERS respimat Inhale 1 puff into the lungs every 6 (six) hours as needed for wheezing or shortness of breath. 1 Inhaler 5  . mirtazapine (REMERON) 15 MG tablet TAKE 1 TABLET BY MOUTH EVERY NIGHT AT BEDTIME 90 tablet 0  . Multiple Vitamins-Minerals (ICAPS AREDS 2) CAPS Take 1 capsule by mouth 2 (two) times daily.    Bertram Gala Glycol-Propyl Glycol (SYSTANE OP) Place 1 drop into both eyes 4 (four) times daily as needed (for dry eyes).     . SYMBICORT 160-4.5 MCG/ACT inhaler INHALE 2 PUFFS BY MOUTH TWICE DAILY. 10.2 g 6  . traMADol (ULTRAM) 50 MG tablet Take 50 mg by mouth every 12 (twelve) hours as needed for moderate pain.     No current facility-administered medications on file prior to visit.     BP 120/72 (BP Location: Left Arm, Patient Position: Sitting, Cuff Size: Normal)   Pulse 82   Temp 97.7 F (36.5 C) (Oral)   Wt 96 lb 12.8 oz (43.9 kg)   SpO2 96%   BMI 16.11 kg/m     Review of Systems  Constitutional: Positive for activity change, appetite change and fatigue. Negative for fever.  HENT: Negative.   Eyes: Negative.   Respiratory: Negative.  Negative for cough and wheezing.   Cardiovascular: Negative.   Neurological: Positive for weakness.       Objective:   Physical Exam  Constitutional: She is oriented to person, place, and time. She appears well-developed and well-nourished.  Alert, no distress Afebrile Blood pressure 120/70 O2 saturation 90-91 at rest  Weight 96.8 Thin, frail but alert   HENT:  Head: Normocephalic.  Right Ear: External ear normal.  Left Ear: External ear normal.  Mouth/Throat: Oropharynx is clear and moist.  Eyes: Conjunctivae and EOM are normal. Pupils are equal, round, and reactive to light.  Neck: Normal range of motion. Neck supple. No thyromegaly present.  Cardiovascular: Normal rate, regular rhythm, normal heart sounds and intact distal pulses.   No tachycardia  Pulmonary/Chest: Effort normal.  Diminished breath sounds Few expiratory rhonchi anteriorly  Abdominal: Soft. Bowel sounds are normal. She exhibits no mass. There is no tenderness.  Musculoskeletal: Normal range of motion.  Lymphadenopathy:    She has no cervical adenopathy.  Neurological: She is alert and oriented to person, place, and time.  Skin: Skin is warm and dry. No rash noted.  Psychiatric: She has a normal mood and affect. Her behavior is normal.  Assessment & Plan:  COPD.  Status post community acquired pneumonia.  Will continue nocturnal O2.  Recheck 1 month with chest x-ray.  Consider discontinuation of oxygen therapy at that time Malnutrition Chronic respiratory insufficiency.  Continue O 2.  Continue present maintenance medications  Recheck one month with follow-up chest x-ray and lab  Rogelia Boga, MD

## 2016-07-25 ENCOUNTER — Ambulatory Visit: Payer: Federal, State, Local not specified - PPO | Admitting: Internal Medicine

## 2016-07-27 ENCOUNTER — Telehealth: Payer: Self-pay | Admitting: *Deleted

## 2016-07-27 NOTE — Telephone Encounter (Signed)
Home health nurse called because concerned about patient. She was feeling good last week but as of yesterday seemed very tired, no energy, reduced appetite. Also c/o pain in left shoulder, arm, chest. Oxygen between 88-95. Called daughter and scheduled follow up appointment with Dr. Kirtland BouchardK for tomorrow at 2:30.

## 2016-07-28 ENCOUNTER — Other Ambulatory Visit: Payer: Self-pay | Admitting: Internal Medicine

## 2016-07-28 ENCOUNTER — Ambulatory Visit (INDEPENDENT_AMBULATORY_CARE_PROVIDER_SITE_OTHER): Payer: Federal, State, Local not specified - PPO | Admitting: Internal Medicine

## 2016-07-28 ENCOUNTER — Encounter: Payer: Self-pay | Admitting: Internal Medicine

## 2016-07-28 ENCOUNTER — Ambulatory Visit (INDEPENDENT_AMBULATORY_CARE_PROVIDER_SITE_OTHER)
Admission: RE | Admit: 2016-07-28 | Discharge: 2016-07-28 | Disposition: A | Discharge status: Home / Self Care | Payer: Federal, State, Local not specified - PPO | Source: Ambulatory Visit | Attending: Internal Medicine | Admitting: Internal Medicine

## 2016-07-28 VITALS — BP 150/60 | HR 109 | Temp 99.3°F | Ht 65.0 in | Wt 99.8 lb

## 2016-07-28 DIAGNOSIS — J9611 Chronic respiratory failure with hypoxia: Secondary | ICD-10-CM | POA: Diagnosis not present

## 2016-07-28 DIAGNOSIS — J90 Pleural effusion, not elsewhere classified: Secondary | ICD-10-CM

## 2016-07-28 DIAGNOSIS — E871 Hypo-osmolality and hyponatremia: Secondary | ICD-10-CM

## 2016-07-28 DIAGNOSIS — M150 Primary generalized (osteo)arthritis: Secondary | ICD-10-CM | POA: Diagnosis not present

## 2016-07-28 DIAGNOSIS — J189 Pneumonia, unspecified organism: Secondary | ICD-10-CM | POA: Diagnosis not present

## 2016-07-28 DIAGNOSIS — K222 Esophageal obstruction: Secondary | ICD-10-CM

## 2016-07-28 DIAGNOSIS — M810 Age-related osteoporosis without current pathological fracture: Secondary | ICD-10-CM | POA: Diagnosis not present

## 2016-07-28 DIAGNOSIS — J948 Other specified pleural conditions: Secondary | ICD-10-CM

## 2016-07-28 DIAGNOSIS — J440 Chronic obstructive pulmonary disease with acute lower respiratory infection: Secondary | ICD-10-CM | POA: Diagnosis not present

## 2016-07-28 LAB — CBC WITH DIFFERENTIAL/PLATELET
BASOS PCT: 0.3 % (ref 0.0–3.0)
Basophils Absolute: 0 10*3/uL (ref 0.0–0.1)
EOS ABS: 0.5 10*3/uL (ref 0.0–0.7)
Eosinophils Relative: 4 % (ref 0.0–5.0)
HEMATOCRIT: 33.8 % — AB (ref 36.0–46.0)
Hemoglobin: 11.3 g/dL — ABNORMAL LOW (ref 12.0–15.0)
LYMPHS ABS: 1.6 10*3/uL (ref 0.7–4.0)
LYMPHS PCT: 12.4 % (ref 12.0–46.0)
MCHC: 33.4 g/dL (ref 30.0–36.0)
MCV: 91.8 fl (ref 78.0–100.0)
Monocytes Absolute: 1.3 10*3/uL — ABNORMAL HIGH (ref 0.1–1.0)
Monocytes Relative: 9.9 % (ref 3.0–12.0)
NEUTROS ABS: 9.5 10*3/uL — AB (ref 1.4–7.7)
Neutrophils Relative %: 73.4 % (ref 43.0–77.0)
PLATELETS: 671 10*3/uL — AB (ref 150.0–400.0)
RBC: 3.68 Mil/uL — ABNORMAL LOW (ref 3.87–5.11)
RDW: 14 % (ref 11.5–15.5)
WBC: 12.9 10*3/uL — ABNORMAL HIGH (ref 4.0–10.5)

## 2016-07-28 LAB — COMPREHENSIVE METABOLIC PANEL
ALT: 19 U/L (ref 0–35)
AST: 18 U/L (ref 0–37)
Albumin: 3.4 g/dL — ABNORMAL LOW (ref 3.5–5.2)
Alkaline Phosphatase: 70 U/L (ref 39–117)
BUN: 19 mg/dL (ref 6–23)
CALCIUM: 9.7 mg/dL (ref 8.4–10.5)
CHLORIDE: 95 meq/L — AB (ref 96–112)
CO2: 32 meq/L (ref 19–32)
CREATININE: 0.53 mg/dL (ref 0.40–1.20)
GFR: 114.86 mL/min (ref >60.00)
Glucose, Bld: 110 mg/dL — ABNORMAL HIGH (ref 70–99)
Potassium: 4.1 mEq/L (ref 3.5–5.1)
Sodium: 134 mEq/L — ABNORMAL LOW (ref 135–145)
Total Bilirubin: 0.4 mg/dL (ref 0.2–1.2)
Total Protein: 7.2 g/dL (ref 6.0–8.3)

## 2016-07-28 MED ORDER — LEVOFLOXACIN 500 MG PO TABS: 500.0000 mg | ORAL_TABLET | Freq: Every day | ORAL | 0 refills | Status: DC

## 2016-07-28 NOTE — Progress Notes (Signed)
Subjective:    Patient ID: Sharon [name], female    DOB: [birth year], 82 y.o.   MRN: [id number]  HPI 82 year old patient who is seen today for follow-up.  She has been hospitalized recently for recurrent community-acquired pneumonia. For the past few days she's had increasing right sided chest pain, shoulder and arm discomfort.  Pain is aggravated by movement She remains quite weak. She has a history of esophageal strictures requiring dilatations in the past.  She is complaining of some worsening swallowing difficulties  Wt Readings from Last 3 Encounters:  07/28/16 99 lb 12.8 oz (45.3 kg)  07/18/16 96 lb 12.8 oz (43.9 kg)  07/12/16 98 lb (44.5 kg)    Past Medical History:  Diagnosis Date  . Anemia   . B12 DEFICIENCY 03/31/2008  . COPD 11/27/2008  . ESOPHAGEAL STRICTURE 01/29/2009  . MASS, LUNG 10/13/2009  . NASAL FRACTURE 08/12/2009  . OSTEOARTHRITIS 03/31/2008   Hips  . OSTEOPOROSIS 12/27/2007  . PEDAL EDEMA 12/27/2007  . Pneumonia 07/10/2016  . PULMONARY NODULE 10/21/2009  . RESPIRATORY FAILURE, CHRONIC 06/29/2010  . Shortness of breath dyspnea      Social History   Social History  . Marital status: Widowed    Spouse name: N/A  . Number of children: N/A  . Years of education: N/A   Occupational History  . Not on file.   Social History Main Topics  . Smoking status: Former Smoker    Quit date: 12/18/1978  . Smokeless tobacco: Never Used  . Alcohol use 0.0 oz/week     Comment: couple of gin and tonics daily  . Drug use: No  . Sexual activity: Not on file   Other Topics Concern  . Not on file   Social History Narrative  . No narrative on file    Past Surgical History:  Procedure Laterality Date  . APPENDECTOMY     '38  . BALLOON DILATION N/A 05/10/2015   Procedure: BALLOON DILATION;  Surgeon: Louis Meckel, MD;  Location: [facility];  Service: Endoscopy;  Laterality: N/A;  . BALLOON DILATION N/A 08/02/2015   Procedure: BALLOON DILATION;  Surgeon: Louis Meckel, MD;  Location: [facility];  Service: Endoscopy;  Laterality: N/A;  . BREAST SURGERY     biopsy '47  . CATARACT EXTRACTION Bilateral    last '05  . CHOLECYSTECTOMY     laparoscopic'10  . ESOPHAGOGASTRODUODENOSCOPY     with dilatation x3  . ESOPHAGOGASTRODUODENOSCOPY (EGD) WITH PROPOFOL N/A 05/10/2015   Procedure: ESOPHAGOGASTRODUODENOSCOPY (EGD) WITH PROPOFOL;  Surgeon: Louis Meckel, MD;  Location: [facility];  Service: Endoscopy;  Laterality: N/A;  balloon dilation  . ESOPHAGOGASTRODUODENOSCOPY (EGD) WITH PROPOFOL N/A 08/02/2015   Procedure: ESOPHAGOGASTRODUODENOSCOPY (EGD) WITH PROPOFOL;  Surgeon: Louis Meckel, MD;  Location: [facility];  Service: Endoscopy;  Laterality: N/A;  . LUMBAR LAMINECTOMY    . NASAL RECONSTRUCTION WITH SEPTAL REPAIR     '10  . RECTAL PROLAPSE REPAIR     '06  . skin graf     '98    Family History  Problem Relation Age of Onset  . Hypertension Son     Allergies  Allergen Reactions  . Penicillins Rash    Has patient had a PCN reaction causing immediate rash, facial/tongue/throat swelling, SOB or lightheadedness with hypotension: Yes Has patient had a PCN reaction causing severe rash involving mucus membranes or skin necrosis: Unknown Has patient had a PCN reaction that required hospitalization: Unknown Has patient had a PCN  reaction occurring within the last 10 years: No If all of the above answers are "NO", then may proceed with Cephalosporin use.     Current Outpatient Prescriptions on File Prior to Visit  Medication Sig Dispense Refill  . ALPRAZolam (XANAX) 0.25 MG tablet Take 1 tablet (0.25 mg total) by mouth 3 (three) times daily as needed. 90 tablet 2  . Calcium Carbonate-Vitamin D (CALTRATE 600+D) 600-400 MG-UNIT per chew tablet Chew 1 tablet by mouth every morning.     . cyanocobalamin (,VITAMIN B-12,) 1000 MCG/ML injection INJECT 1 ML IN THE MUSCLE ONCE MONTHLY 10 mL 9  . escitalopram (LEXAPRO) 5 MG tablet TAKE 1 TABLET BY  MOUTH DAILY 90 tablet 1  . Ipratropium-Albuterol (COMBIVENT) 20-100 MCG/ACT AERS respimat Inhale 1 puff into the lungs every 6 (six) hours as needed for wheezing or shortness of breath. 1 Inhaler 5  . mirtazapine (REMERON) 15 MG tablet TAKE 1 TABLET BY MOUTH EVERY NIGHT AT BEDTIME 90 tablet 0  . Multiple Vitamins-Minerals (ICAPS AREDS 2) CAPS Take 1 capsule by mouth 2 (two) times daily.    Bertram Gala. Polyethyl Glycol-Propyl Glycol (SYSTANE OP) Place 1 drop into both eyes 4 (four) times daily as needed (for dry eyes).     . SYMBICORT 160-4.5 MCG/ACT inhaler INHALE 2 PUFFS BY MOUTH TWICE DAILY. 10.2 g 6   No current facility-administered medications on file prior to visit.     BP (!) 150/60 (BP Location: Right Arm, Patient Position: Sitting, Cuff Size: Normal)   Pulse (!) 109   Temp 99.3 F (37.4 C) (Oral)   Ht 5\' 5"  (1.651 m)   Wt 99 lb 12.8 oz (45.3 kg)   SpO2 90%   BMI 16.61 kg/m      Review of Systems  Constitutional: Positive for activity change and fatigue.  HENT: Negative for congestion, dental problem, hearing loss, rhinorrhea, sinus pressure, sore throat and tinnitus.   Eyes: Negative for pain, discharge and visual disturbance.  Respiratory: Positive for shortness of breath. Negative for cough.   Cardiovascular: Positive for chest pain. Negative for palpitations and leg swelling.  Gastrointestinal: Negative for abdominal distention, abdominal pain, blood in stool, constipation, diarrhea, nausea and vomiting.  Genitourinary: Negative for difficulty urinating, dysuria, flank pain, frequency, hematuria, pelvic pain, urgency, vaginal bleeding, vaginal discharge and vaginal pain.  Musculoskeletal: Negative for arthralgias, gait problem and joint swelling.  Skin: Negative for rash.  Neurological: Positive for weakness. Negative for dizziness, syncope, speech difficulty, numbness and headaches.  Hematological: Negative for adenopathy.  Psychiatric/Behavioral: Negative for agitation,  behavioral problems and dysphoric mood. The patient is not nervous/anxious.        Objective:   Physical Exam  Constitutional: She is oriented to person, place, and time. She appears well-developed and well-nourished.  Weak, frail O2 saturation 90 Blood pressure 140/60 Pulse 90 after rest  HENT:  Head: Normocephalic.  Right Ear: External ear normal.  Left Ear: External ear normal.  Mouth/Throat: Oropharynx is clear and moist.  Eyes: Conjunctivae and EOM are normal. Pupils are equal, round, and reactive to light.  Neck: Normal range of motion. Neck supple. No thyromegaly present.  Cardiovascular: Normal rate, regular rhythm, normal heart sounds and intact distal pulses.   Pulmonary/Chest: Effort normal.  Mild resting tachypnea Right basilar rales Decreased breath sounds and dullness left lower hemithorax  Abdominal: Soft. Bowel sounds are normal. She exhibits no mass. There is no tenderness.  Musculoskeletal: Normal range of motion.  Lymphadenopathy:    She has no cervical  adenopathy.  Neurological: She is alert and oriented to person, place, and time.  Skin: Skin is warm and dry. No rash noted.  Psychiatric: She has a normal mood and affect. Her behavior is normal.          Assessment & Plan:   Left chest wall, arm and back discomfort History community-acquired pneumonia.  Rule out Left sided effusion.  Will check follow-up chest x-ray COPD with chronic respiratory failure.  Continue chronic O2 Weakness, history of hyponatremia.  Will check CBC and electrolytes  Chest wall pain Dysphagia.  Will defer GI consult at this time until clinically more stable or unless symptoms intensify  Recheck 4 weeks  Rogelia Boga, MD

## 2016-07-28 NOTE — Patient Instructions (Signed)
Chest x-ray as discussed  Return in one month for follow-up or as needed  Report any clinical worsening  Continue chronic oxygen therapy

## 2016-07-31 ENCOUNTER — Telehealth: Payer: Self-pay | Admitting: Internal Medicine

## 2016-07-31 NOTE — Telephone Encounter (Signed)
Please obtain earlier.  Pulmonary consultation if available Encourage patient to complete antibiotic therapy  Return office visit here in 2 weeks

## 2016-07-31 NOTE — Telephone Encounter (Signed)
Pt wanted to let you know that pulmonary can not get her in until 9/8 w/Dr. Kendrick FriesMcQuaid and wanted to know if there is another pulmonologist she can see due to her having the fluid on her L lung.

## 2016-08-01 ENCOUNTER — Telehealth: Payer: Self-pay | Admitting: Internal Medicine

## 2016-08-01 ENCOUNTER — Other Ambulatory Visit: Payer: Self-pay | Admitting: Internal Medicine

## 2016-08-01 MED ORDER — LEVOFLOXACIN 500 MG PO TABS: 500.0000 mg | ORAL_TABLET | Freq: Every day | ORAL | 0 refills | Status: DC

## 2016-08-01 NOTE — Telephone Encounter (Signed)
Okay for PT evaluation? 

## 2016-08-01 NOTE — Telephone Encounter (Signed)
Sharon [name] is requesting order for PT evaluation

## 2016-08-01 NOTE — Telephone Encounter (Signed)
Spoke with patient and advised to finish antibiotic. Patient verbalized understanding. Patient was advised by pulmonology office to call daily to check to see if they have had a cancellation so they can see her sooner.

## 2016-08-04 ENCOUNTER — Telehealth: Payer: Self-pay | Admitting: Internal Medicine

## 2016-08-04 NOTE — Telephone Encounter (Signed)
Pt has been evaluated today and amber  would like order for twice a wk for 3 wks.verbal is ok

## 2016-08-07 NOTE — Telephone Encounter (Signed)
Left verbal order on Ambers answering machine. PT okay.

## 2016-08-15 ENCOUNTER — Encounter: Payer: Self-pay | Admitting: Internal Medicine

## 2016-08-15 ENCOUNTER — Ambulatory Visit (INDEPENDENT_AMBULATORY_CARE_PROVIDER_SITE_OTHER): Payer: Medicare Other | Admitting: Internal Medicine

## 2016-08-15 VITALS — BP 100/60 | HR 61 | Temp 98.6°F | Resp 18 | Ht 65.0 in | Wt 101.2 lb

## 2016-08-15 DIAGNOSIS — J9611 Chronic respiratory failure with hypoxia: Secondary | ICD-10-CM | POA: Diagnosis not present

## 2016-08-15 DIAGNOSIS — J189 Pneumonia, unspecified organism: Secondary | ICD-10-CM | POA: Diagnosis not present

## 2016-08-15 NOTE — Progress Notes (Signed)
Pre visit review using our clinic review tool, if applicable. No additional management support is needed unless otherwise documented below in the visit note. 

## 2016-08-15 NOTE — Progress Notes (Signed)
Subjective:    Patient ID: Sharon [name], female    DOB: 07-24-1923, 82 y.o.   MRN: [id number]  HPI  82 year old patient who is seen today in follow-up. She was hospitalized twice in July with complications of CAP.  She has a history of COPD and chronic hypoxic respiratory insufficiency.  She remains on home O2. She was last seen in follow-up on August 11.  At that time she remained quite weak and evaluation revealed a new infiltrate involving the left lower lobe associated with a new pleural effusion.  The patient has been treated with Levaquin for 14 days and has had a nice clinical response. She feels much improved with better appetite, general sense of well-being and has gained weight.  She is quite pleased with her progress. She is scheduled for pulmonary follow-up tomorrow.  She has been faithful with her maintenance medication as well as incentive spirometry.  Past Medical History:  Diagnosis Date  . Anemia   . B12 DEFICIENCY 03/31/2008  . COPD 11/27/2008  . ESOPHAGEAL STRICTURE 01/29/2009  . MASS, LUNG 10/13/2009  . NASAL FRACTURE 08/12/2009  . OSTEOARTHRITIS 03/31/2008   Hips  . OSTEOPOROSIS 12/27/2007  . PEDAL EDEMA 12/27/2007  . Pneumonia 07/10/2016  . PULMONARY NODULE 10/21/2009  . RESPIRATORY FAILURE, CHRONIC 06/29/2010  . Shortness of breath dyspnea      Social History   Social History  . Marital status: Widowed    Spouse name: N/A  . Number of children: N/A  . Years of education: N/A   Occupational History  . Not on file.   Social History Main Topics  . Smoking status: Former Smoker    Quit date: 12/18/1978  . Smokeless tobacco: Never Used  . Alcohol use 0.0 oz/week     Comment: couple of gin and tonics daily  . Drug use: No  . Sexual activity: Not on file   Other Topics Concern  . Not on file   Social History Narrative  . No narrative on file    Past Surgical History:  Procedure Laterality Date  . APPENDECTOMY     '38  . BALLOON DILATION N/A  05/10/2015   Procedure: BALLOON DILATION;  Surgeon: Louis Meckelobert D Kaplan, MD;  [facility];  Service: Endoscopy;  Laterality: N/A;  . BALLOON DILATION N/A 08/02/2015   Procedure: BALLOON DILATION;  Surgeon: Louis Meckelobert D Kaplan, MD;  Location: [facility];  Service: Endoscopy;  Laterality: N/A;  . BREAST SURGERY     biopsy '47  . CATARACT EXTRACTION Bilateral    last '05  . CHOLECYSTECTOMY     laparoscopic'10  . ESOPHAGOGASTRODUODENOSCOPY     with dilatation x3  . ESOPHAGOGASTRODUODENOSCOPY (EGD) WITH PROPOFOL N/A 05/10/2015   Procedure: ESOPHAGOGASTRODUODENOSCOPY (EGD) WITH PROPOFOL;  Surgeon: Louis Meckelobert D Kaplan, MD;  Location: [facility];  Service: Endoscopy;  Laterality: N/A;  balloon dilation  . ESOPHAGOGASTRODUODENOSCOPY (EGD) WITH PROPOFOL N/A 08/02/2015   Procedure: ESOPHAGOGASTRODUODENOSCOPY (EGD) WITH PROPOFOL;  Surgeon: Louis Meckelobert D Kaplan, MD;  Location: [facility];  Service: Endoscopy;  Laterality: N/A;  . LUMBAR LAMINECTOMY    . NASAL RECONSTRUCTION WITH SEPTAL REPAIR     '10  . RECTAL PROLAPSE REPAIR     '06  . skin graf     '98    Family History  Problem Relation Age of Onset  . Hypertension Son     Allergies  Allergen Reactions  . Penicillins Rash    Has patient had a PCN reaction causing immediate rash, facial/tongue/throat swelling,  SOB or lightheadedness with hypotension: Yes Has patient had a PCN reaction causing severe rash involving mucus membranes or skin necrosis: Unknown Has patient had a PCN reaction that required hospitalization: Unknown Has patient had a PCN reaction occurring within the last 10 years: No If all of the above answers are "NO", then may proceed with Cephalosporin use.     Current Outpatient Prescriptions on File Prior to Visit  Medication Sig Dispense Refill  . ALPRAZolam (XANAX) 0.25 MG tablet Take 1 tablet (0.25 mg total) by mouth 3 (three) times daily as needed. 90 tablet 2  . Calcium Carbonate-Vitamin D (CALTRATE 600+D) 600-400  MG-UNIT per chew tablet Chew 1 tablet by mouth every morning.     . cyanocobalamin (,VITAMIN B-12,) 1000 MCG/ML injection INJECT 1 ML IN THE MUSCLE ONCE MONTHLY 10 mL 9  . escitalopram (LEXAPRO) 5 MG tablet TAKE 1 TABLET BY MOUTH DAILY 90 tablet 1  . Ipratropium-Albuterol (COMBIVENT) 20-100 MCG/ACT AERS respimat Inhale 1 puff into the lungs every 6 (six) hours as needed for wheezing or shortness of breath. 1 Inhaler 5  . mirtazapine (REMERON) 15 MG tablet TAKE 1 TABLET BY MOUTH EVERY NIGHT AT BEDTIME 90 tablet 0  . Multiple Vitamins-Minerals (ICAPS AREDS 2) CAPS Take 1 capsule by mouth 2 (two) times daily.    Bertram Gala Glycol-Propyl Glycol (SYSTANE OP) Place 1 drop into both eyes 4 (four) times daily as needed (for dry eyes).     . SYMBICORT 160-4.5 MCG/ACT inhaler INHALE 2 PUFFS BY MOUTH TWICE DAILY. 10.2 g 6   No current facility-administered medications on file prior to visit.     BP 100/60 (BP Location: Left Arm, Patient Position: Sitting, Cuff Size: Normal)   Pulse 61   Temp 98.6 F (37 C) (Oral)   Resp 18   Ht 5\' 5"  (1.651 m)   Wt 101 lb 4 oz (45.9 kg)   SpO2 93%   BMI 16.85 kg/m     Review of Systems  Constitutional: Positive for fatigue.  HENT: Negative for congestion, dental problem, hearing loss, rhinorrhea, sinus pressure, sore throat and tinnitus.   Eyes: Negative for pain, discharge and visual disturbance.  Respiratory: Negative for cough and shortness of breath.   Cardiovascular: Negative for chest pain, palpitations and leg swelling.  Gastrointestinal: Negative for abdominal distention, abdominal pain, blood in stool, constipation, diarrhea, nausea and vomiting.  Genitourinary: Negative for difficulty urinating, dysuria, flank pain, frequency, hematuria, pelvic pain, urgency, vaginal bleeding, vaginal discharge and vaginal pain.  Musculoskeletal: Negative for arthralgias, gait problem and joint swelling.  Skin: Negative for rash.  Neurological: Negative for  dizziness, syncope, speech difficulty, weakness, numbness and headaches.  Hematological: Negative for adenopathy.  Psychiatric/Behavioral: Negative for agitation, behavioral problems and dysphoric mood. The patient is not nervous/anxious.        Objective:   Physical Exam  Constitutional: She is oriented to person, place, and time. She appears well-developed and well-nourished. No distress.  Elderly, frail no acute distress O2 sats ration room air 93% Weight 101.4 Afebrile  HENT:  Head: Normocephalic.  Right Ear: External ear normal.  Left Ear: External ear normal.  Mouth/Throat: Oropharynx is clear and moist.  Eyes: Conjunctivae and EOM are normal. Pupils are equal, round, and reactive to light.  Neck: Neck supple. No JVD present. No thyromegaly present.  Cardiovascular: Normal rate, regular rhythm, normal heart sounds and intact distal pulses.   Pulmonary/Chest: Effort normal. No respiratory distress. She has rales.  Generally diminished breath sounds Few  crackles right base  Rales and diminished breath sounds left lower hemithorax  Abdominal: Soft. Bowel sounds are normal. She exhibits no mass. There is no tenderness.  Musculoskeletal: Normal range of motion.  Lymphadenopathy:    She has no cervical adenopathy.  Neurological: She is alert and oriented to person, place, and time.  Skin: Skin is warm and dry. No rash noted.  Psychiatric: She has a normal mood and affect. Her behavior is normal.          Assessment & Plan:   Recurrent left lower lobe pneumonia with secondary effusion.  Clinically improving Chronic hypoxic respiratory failure.  Improving.  Patient anxious for discontinuation of home O2.  Follow-up pulmonary medicine.   Probably will benefit from at least nocturnal O2  .  Return in 3 months for follow-up or as needed  Rogelia Boga

## 2016-08-15 NOTE — Patient Instructions (Signed)
Pulmonary medicine follow-up as scheduled tomorrow  Continue physical therapy  Return in 3 months for follow-up

## 2016-08-16 ENCOUNTER — Ambulatory Visit (INDEPENDENT_AMBULATORY_CARE_PROVIDER_SITE_OTHER): Payer: Federal, State, Local not specified - PPO | Admitting: Internal Medicine

## 2016-08-16 ENCOUNTER — Telehealth: Payer: Self-pay | Admitting: Internal Medicine

## 2016-08-16 ENCOUNTER — Other Ambulatory Visit (INDEPENDENT_AMBULATORY_CARE_PROVIDER_SITE_OTHER): Payer: Federal, State, Local not specified - PPO

## 2016-08-16 ENCOUNTER — Ambulatory Visit (HOSPITAL_COMMUNITY)
Admission: RE | Admit: 2016-08-16 | Discharge: 2016-08-16 | Disposition: A | Discharge status: Home / Self Care | Payer: Medicare Other | Source: Ambulatory Visit | Attending: Vascular Surgery | Admitting: Vascular Surgery

## 2016-08-16 ENCOUNTER — Encounter: Payer: Self-pay | Admitting: Internal Medicine

## 2016-08-16 ENCOUNTER — Ambulatory Visit (INDEPENDENT_AMBULATORY_CARE_PROVIDER_SITE_OTHER)
Admission: RE | Admit: 2016-08-16 | Discharge: 2016-08-16 | Disposition: A | Discharge status: Home / Self Care | Payer: Federal, State, Local not specified - PPO | Source: Ambulatory Visit | Attending: Internal Medicine | Admitting: Internal Medicine

## 2016-08-16 ENCOUNTER — Telehealth: Payer: Self-pay | Admitting: *Deleted

## 2016-08-16 VITALS — BP 114/62 | HR 71 | Ht 65.0 in | Wt 102.0 lb

## 2016-08-16 DIAGNOSIS — J918 Pleural effusion in other conditions classified elsewhere: Principal | ICD-10-CM

## 2016-08-16 DIAGNOSIS — J189 Pneumonia, unspecified organism: Secondary | ICD-10-CM

## 2016-08-16 DIAGNOSIS — J948 Other specified pleural conditions: Secondary | ICD-10-CM | POA: Diagnosis not present

## 2016-08-16 DIAGNOSIS — R609 Edema, unspecified: Secondary | ICD-10-CM | POA: Insufficient documentation

## 2016-08-16 DIAGNOSIS — J9611 Chronic respiratory failure with hypoxia: Secondary | ICD-10-CM

## 2016-08-16 DIAGNOSIS — J449 Chronic obstructive pulmonary disease, unspecified: Secondary | ICD-10-CM | POA: Diagnosis not present

## 2016-08-16 LAB — SEDIMENTATION RATE: SED RATE: 48 mm/h — AB (ref 0–30)

## 2016-08-16 LAB — CBC WITH DIFFERENTIAL/PLATELET
Basophils Absolute: 0 10*3/uL (ref 0.0–0.1)
Basophils Relative: 0.4 % (ref 0.0–3.0)
EOS PCT: 4.3 % (ref 0.0–5.0)
Eosinophils Absolute: 0.4 10*3/uL (ref 0.0–0.7)
HCT: 34.1 % — ABNORMAL LOW (ref 36.0–46.0)
Hemoglobin: 11.4 g/dL — ABNORMAL LOW (ref 12.0–15.0)
LYMPHS ABS: 1.7 10*3/uL (ref 0.7–4.0)
Lymphocytes Relative: 18.4 % (ref 12.0–46.0)
MCHC: 33.5 g/dL (ref 30.0–36.0)
MCV: 92.3 fl (ref 78.0–100.0)
MONO ABS: 0.8 10*3/uL (ref 0.1–1.0)
Monocytes Relative: 8.8 % (ref 3.0–12.0)
NEUTROS PCT: 68.1 % (ref 43.0–77.0)
Neutro Abs: 6.2 10*3/uL (ref 1.4–7.7)
Platelets: 355 10*3/uL (ref 150.0–400.0)
RBC: 3.7 Mil/uL — AB (ref 3.87–5.11)
RDW: 15.1 % (ref 11.5–15.5)
WBC: 9.2 10*3/uL (ref 4.0–10.5)

## 2016-08-16 NOTE — Assessment & Plan Note (Addendum)
CAP LUL >  admit 07/09/16  - ESR 08/16/2016 = 48   Classic late parapneumonic L effusion s/p CAP in late July with mostly mechanical (not septic) symptoms   Note that pleural effusion and copd have the same effect on insp muscles (shorten their length prior to insp making them weaker with less reserve) so they are synergistic in causing sob.  She is comfortable at rest but now on 02 with activity and hs so probably needs to have this tapped dry if possible.   Discussed in detail all the  indications, usual  risks and alternatives  relative to the benefits with patient who agrees to proceed with conservative f/u as outlined    Total time devoted to counseling  = 35/51mreview case with pt/ daughter in law / discussion of options/alternatives/ personally creating written instructions  in presence of pt  then going over those specific  Instructions directly with the pt including how to use all of the meds but in particular covering each new medication in detail and the difference between the maintenance/automatic meds and the prns using an action plan format for the latter.

## 2016-08-16 NOTE — Telephone Encounter (Signed)
Pt had venous doppler today.  I called her to let her know of thorancentesis appt & that is what she was unaware of.  She wanted nurse to explain thorancentesis to her.

## 2016-08-16 NOTE — Assessment & Plan Note (Signed)
Quit smoking 1980 -  PFT's December 03, 2009 FEV1 67 (40%) with 12% response to B2 and DLC0 55%  Adequate control on present rx, reviewed > no change in rx needed  = continue symbicort 160 2bid and prn combivent

## 2016-08-16 NOTE — Progress Notes (Signed)
Subjective:    Patient ID: Sharon [name], female    DOB: [birth year],    MRN: [id number]  HPI 80  yowf quit smoking in 1980 with no minimal chronic doe until broke nose in August 2010 despite mild chronic nodular infiltrates dating back to 11/23/2008 with documented GOLD III criteria cod    December 03, 2009 1 month followup with cxr and PFT's.  Pt states breathing is fine and denies any complaints today.  No limiting sob, getting better on steps, no cough or early am cough or congestion.  rec symbicort and f/u cxr in 6 month  Fx Right Hip and required ORIF June 19th 2011and placed on 02 post op  June 29, 2010 Post hospital followup.  Pt states that her breathing is fine.  She feels like she does not need to be on supplemental o2.  No compliants today. no cough. not using symbicort.  rec Try  using symbicort 2 puffs first thing  in am and 2 puffs again in pm about 12 hours later prescription and return here as needed Work on inhaler technique:  relax and blow all the way out then take a nice smooth deep breath back in, triggering the inhaler at same time you start breathing in  [facility] to leave 02 off while sitting.   August 11, 2010 6 weeks follow up, pt states breathing is fine, pt states she exercises everyday and has no SOB,  Pt states she has no concerns today. no limiting sob. no cough. no noct c/os on symbicort 2 puffs first thing  in am and 2 puffs again in pm about 12 hours later. Stay on symbicort 160 2 puffs first thing  in am and 2 puffs again in pm about 12 hours later but if you do ok on 02 test we'll ask you to use the symbicort 160  See Patient Care Coordinator before leaving for ONO on Room air  November 03, 2010 ov still wearing 02 at hs only but no limits with daytime activity  rec 1)  ok to stop 02 at this point 2)  ok to change symbicort 160 2 puffs first thing  in am and 2 puffs again in pm about 12 hours later as needed          Admit date: 07/09/2016 Discharge  date: 07/12/2016  Discharge Diagnoses:  Principal Problem:   CAP (community acquired pneumonia)   Depression   Community acquired pneumonia   COPD (chronic obstructive pulmonary disease) with emphysema (HCC)   Sepsis (HCC)   Acute respiratory failure with hypoxia (HCC)   Hyponatremia      08/16/2016 1st [facility] office visit/Epic era/ Bayla Mcgovern   Chief Complaint  Patient presents with  . Pulmonary Consult    Referred by Dr. Eleonore Chiquito for pleural effusion. Pt states that she has been dxed with PNA x 2 since July 2017. She has had cough and SOB. Cough is currently non prod.   baseline = MMRC2 = can't walk a nl pace on a flat grade s sob but does fine slow and flat eg [employer] shopping/ uses [facility] parking on symbicort 160 2bid and rare need for combivent   Acutely ill with hoarseness in [place] p getting off plan from [place] then dry cough and admit:  Now on 02 at 3lpm 24/7  No obvious day to day or daytime variability or assoc excess/ purulent sputum or mucus plugs or hemoptysis or cp or chest tightness, subjective wheeze or overt  sinus or hb symptoms. No unusual exp hx or h/o childhood pna/ asthma or knowledge of premature birth.  Sleeping ok without nocturnal  or early am exacerbation  of respiratory  c/o's or need for noct saba. Also denies any obvious fluctuation of symptoms with weather or environmental changes or other aggravating or alleviating factors except as outlined above   Current Medications, Allergies, Complete Past Medical History, Past Surgical History, Family History, and Social History were reviewed in [employer] record.            Past Medical History: Osteoporosis B12 deficiency Osteoarthritis COPD    - PFT's December 03, 2009 FEV1 67 (40%) with 12% response to B2 and DLC0 55%    - HFA 25% June 29, 2010 > 75% August 11, 2010 > 75% November 03, 2010  Fx Right Hip and required ORIF June 19th 2011> 02 dependent since > d/c  November 03, 2010  history of esophageal stricture...........................................Marland KitchenPatterson Multiple pulmonary nodules..................................................Marland KitchenWert    -CT chest 10/13/09 assoc with bilateral lower lobe bronchiectasis    -CXR December 03, 2009 no change  > no change June 29, 2010     Review of Systems  Constitutional: Negative for chills, fever and unexpected weight change.  HENT: Negative for congestion, dental problem, ear pain, nosebleeds, postnasal drip, rhinorrhea, sinus pressure, sneezing, sore throat, trouble swallowing and voice change.   Eyes: Negative for visual disturbance.  Respiratory: Positive for cough and shortness of breath. Negative for choking.   Cardiovascular: Negative for chest pain and leg swelling.  Gastrointestinal: Negative for abdominal pain, diarrhea and vomiting.  Genitourinary: Negative for difficulty urinating.  Musculoskeletal: Negative for arthralgias.  Skin: Negative for rash.  Neurological: Negative for tremors, syncope and headaches.  Hematological: Does not bruise/bleed easily.       Objective:   Physical Exam   amb wf nad mod hoarse  Wt Readings from Last 3 Encounters:  08/16/16 102 lb (46.3 kg)  08/15/16 101 lb 4 oz (45.9 kg)  07/28/16 99 lb 12.8 oz (45.3 kg)    Vital signs reviewed - note 92% on RA on arrival   HEENT: nl dentition, turbinates, and oropharynx. Nl external ear canals without cough reflex   NECK :  without JVD/Nodes/TM/ nl carotid upstrokes bilaterally   LUNGS: no acc muscle use,  slt barrel contour with hyperresonance except in L base where there is dullness and minimal rhonci s localized wheeze or bronchial changes    CV:  RRR  no s3 or murmur or increase in P2, no edema   ABD:  soft and nontender with nl inspiratory excursion in the supine position. No bruits or organomegaly, bowel sounds nl  MS:  Nl gait/ ext warm without deformities, calf tenderness, cyanosis or  clubbing No obvious joint restrictions - L ankle with 1+ pitting edema/ nl on R   SKIN: warm and dry without lesions    NEURO:  alert, approp, nl sensorium with  no motor deficits     CXR PA and Lateral:   08/16/2016 :    I personally reviewed images and agree with radiology impression as follows:   COPD. Slight interval decrease in the moderate size right pleural effusion. No residual upper lobe pneumonia but pneumonia or other pathology could be obscured at the right lung base.    Lab Results  Component Value Date   WBC 9.2 08/16/2016   HGB 11.4 (L) 08/16/2016   HCT 34.1 (L) 08/16/2016   MCV 92.3 08/16/2016  PLT 355.0 08/16/2016       Lab Results  Component Value Date   ESRSEDRATE 48 (H) 08/16/2016         Assessment & Plan:

## 2016-08-16 NOTE — Telephone Encounter (Signed)
Please see patient coordinator before you leave today  to schedule venous doppler asap on Left  Please remember to go to the lab and x-ray department downstairs for your tests - we will call you with the results when they are available and arrange for fluid removal if needed      I called and advised the pt about what was discussed at her visit today.  Per MW--they did the cxr to see if any fluid needed to be pulled off. cxr results came back and this did need to be done so order was placed.    MW the doppler was not ordered today for the pt.  Did you want to wait until after the thoracentesis is completed to order doppler?  Please advise thanks

## 2016-08-16 NOTE — Telephone Encounter (Signed)
I called spoke with pt. She reports she already knows what this test was for. I did advise again it is where fluid would be drawn off. Nothing further needed

## 2016-08-16 NOTE — Assessment & Plan Note (Signed)
Quit smoking 1980 -  PFT's December 03, 2009 FEV1 67 (40%) with 12% response to B2 and DLC0 55%

## 2016-08-16 NOTE — Patient Instructions (Addendum)
Please see patient coordinator before you leave today  to schedule venous doppler asap on Left  Please remember to go to the lab and x-ray department downstairs for your tests - we will call you with the results when they are available and arrange for fluid removal if needed

## 2016-08-16 NOTE — Telephone Encounter (Signed)
error 

## 2016-08-17 ENCOUNTER — Ambulatory Visit (HOSPITAL_COMMUNITY)
Admission: RE | Admit: 2016-08-17 | Discharge: 2016-08-17 | Disposition: A | Discharge status: Home / Self Care | Payer: Federal, State, Local not specified - PPO | Source: Ambulatory Visit | Attending: Radiology | Admitting: Radiology

## 2016-08-17 ENCOUNTER — Ambulatory Visit (HOSPITAL_COMMUNITY)
Admission: RE | Admit: 2016-08-17 | Discharge: 2016-08-17 | Disposition: A | Discharge status: Home / Self Care | Payer: Federal, State, Local not specified - PPO | Source: Ambulatory Visit | Attending: Internal Medicine | Admitting: Internal Medicine

## 2016-08-17 DIAGNOSIS — J189 Pneumonia, unspecified organism: Secondary | ICD-10-CM

## 2016-08-17 DIAGNOSIS — J948 Other specified pleural conditions: Secondary | ICD-10-CM | POA: Insufficient documentation

## 2016-08-17 DIAGNOSIS — J439 Emphysema, unspecified: Secondary | ICD-10-CM | POA: Diagnosis not present

## 2016-08-17 DIAGNOSIS — J918 Pleural effusion in other conditions classified elsewhere: Secondary | ICD-10-CM

## 2016-08-17 DIAGNOSIS — J90 Pleural effusion, not elsewhere classified: Secondary | ICD-10-CM

## 2016-08-17 LAB — BODY FLUID CELL COUNT WITH DIFFERENTIAL
Eos, Fluid: 1 %
Lymphs, Fluid: 86 %
Monocyte-Macrophage-Serous Fluid: 9 % — ABNORMAL LOW (ref 50–90)
Neutrophil Count, Fluid: 4 % (ref 0–25)
Total Nucleated Cell Count, Fluid: 1020 cu mm — ABNORMAL HIGH (ref 0–1000)

## 2016-08-17 LAB — PROTEIN, BODY FLUID: TOTAL PROTEIN, FLUID: 3.8 g/dL

## 2016-08-17 LAB — LACTATE DEHYDROGENASE, PLEURAL OR PERITONEAL FLUID: LD, Fluid: 66 U/L — ABNORMAL HIGH (ref 3–23)

## 2016-08-17 LAB — GRAM STAIN

## 2016-08-17 LAB — GLUCOSE, SEROUS FLUID: GLUCOSE FL: 91 mg/dL

## 2016-08-17 MED ORDER — LIDOCAINE HCL 1 % IJ SOLN
INTRAMUSCULAR | Status: AC
  Filled 2016-08-17: qty 20

## 2016-08-17 NOTE — Procedures (Signed)
   US guided L thoracentesis  1 liter yellow fluid  Sent for labs  cxr pending

## 2016-08-22 LAB — CULTURE, BODY FLUID-BOTTLE: CULTURE: NO GROWTH

## 2016-08-22 LAB — CULTURE, BODY FLUID W GRAM STAIN -BOTTLE

## 2016-08-22 NOTE — Progress Notes (Signed)
Spoke with pt and notified of results per Dr. Wert. Pt verbalized understanding and denied any questions. 

## 2016-08-25 ENCOUNTER — Institutional Professional Consult (permissible substitution): Payer: Federal, State, Local not specified - PPO | Admitting: Pulmonary Disease

## 2016-08-25 ENCOUNTER — Other Ambulatory Visit: Payer: Self-pay | Admitting: Internal Medicine

## 2016-09-14 ENCOUNTER — Other Ambulatory Visit: Payer: Self-pay | Admitting: Internal Medicine

## 2016-09-21 ENCOUNTER — Ambulatory Visit (INDEPENDENT_AMBULATORY_CARE_PROVIDER_SITE_OTHER): Payer: Federal, State, Local not specified - PPO

## 2016-09-21 DIAGNOSIS — Z23 Encounter for immunization: Secondary | ICD-10-CM | POA: Diagnosis not present

## 2016-10-04 ENCOUNTER — Encounter: Payer: Self-pay | Admitting: Adult Health

## 2016-10-04 ENCOUNTER — Ambulatory Visit (INDEPENDENT_AMBULATORY_CARE_PROVIDER_SITE_OTHER)
Admission: RE | Admit: 2016-10-04 | Discharge: 2016-10-04 | Disposition: A | Discharge status: Home / Self Care | Payer: Federal, State, Local not specified - PPO | Source: Ambulatory Visit | Attending: Adult Health | Admitting: Adult Health

## 2016-10-04 ENCOUNTER — Ambulatory Visit (INDEPENDENT_AMBULATORY_CARE_PROVIDER_SITE_OTHER): Payer: Federal, State, Local not specified - PPO | Admitting: Adult Health

## 2016-10-04 VITALS — BP 126/72 | Temp 98.6°F | Ht 65.0 in | Wt 99.9 lb

## 2016-10-04 DIAGNOSIS — J181 Lobar pneumonia, unspecified organism: Secondary | ICD-10-CM

## 2016-10-04 DIAGNOSIS — J189 Pneumonia, unspecified organism: Secondary | ICD-10-CM

## 2016-10-04 DIAGNOSIS — R05 Cough: Secondary | ICD-10-CM

## 2016-10-04 DIAGNOSIS — R059 Cough, unspecified: Secondary | ICD-10-CM

## 2016-10-04 MED ORDER — PREDNISONE 20 MG PO TABS: 20.0000 mg | ORAL_TABLET | Freq: Every day | ORAL | 0 refills | Status: DC

## 2016-10-04 MED ORDER — LEVOFLOXACIN 750 MG PO TABS: 750.0000 mg | ORAL_TABLET | Freq: Every day | ORAL | 0 refills | Status: DC

## 2016-10-04 NOTE — Patient Instructions (Signed)
It was great meeting you today   I will call you when I get the results of the chest x ray back and we can decide on what we will do.

## 2016-10-04 NOTE — Addendum Note (Signed)
Addended by: Nancy FetterNAFZIGER, Chaz Ronning L on: 10/04/2016 05:33 PM   Modules accepted: Orders

## 2016-10-04 NOTE — Progress Notes (Addendum)
Subjective:    Patient ID: Sharon [name], female    DOB: [birth year], 82 y.o.   MRN: [id number]  HPI  82 year old female who presents to the office today for the complaint of productive cough and low grade fever and fatigue. She has a history of COPD in which she reports while wearing 3 L [place] her oxygen saturation is in the low 90's at baseline.  She reports that about a week ago she started to have a productive cough after she was taking care of small child that had a " cold".   She has recently ( July and August) been treated for pneumonia and had fluid drained from a pleural effusion.   Review of Systems  Constitutional: Negative.   HENT: Negative.   Respiratory: Positive for cough, chest tightness and shortness of breath.   Cardiovascular: Negative.   Neurological: Negative.   Psychiatric/Behavioral: Negative.  Negative for sleep disturbance.  All other systems reviewed and are negative.  Past Medical History:  Diagnosis Date  . Anemia   . B12 DEFICIENCY 03/31/2008  . COPD 11/27/2008  . ESOPHAGEAL STRICTURE 01/29/2009  . MASS, LUNG 10/13/2009  . NASAL FRACTURE 08/12/2009  . OSTEOARTHRITIS 03/31/2008   Hips  . OSTEOPOROSIS 12/27/2007  . PEDAL EDEMA 12/27/2007  . Pneumonia 07/10/2016  . PULMONARY NODULE 10/21/2009  . RESPIRATORY FAILURE, CHRONIC 06/29/2010  . Shortness of breath dyspnea     Social History   Social History  . Marital status: Widowed    Spouse name: N/A  . Number of children: N/A  . Years of education: N/A   Occupational History  . Not on file.   Social History Main Topics  . Smoking status: Former Smoker    Packs/day: 0.50    Years: 35.00    Quit date: 12/18/1978  . Smokeless tobacco: Never Used  . Alcohol use 0.0 oz/week     Comment: couple of gin and tonics daily  . Drug use: No  . Sexual activity: Not on file   Other Topics Concern  . Not on file   Social History Narrative  . No narrative on file    Past Surgical History:  Procedure  Laterality Date  . APPENDECTOMY     '38  . BALLOON DILATION N/A 05/10/2015   Procedure: BALLOON DILATION;  Surgeon: Louis Meckelobert D Kaplan, MD;  [facility];  Service: Endoscopy;  Laterality: N/A;  . BALLOON DILATION N/A 08/02/2015   Procedure: BALLOON DILATION;  Surgeon: Louis Meckelobert D Kaplan, MD;  Location: [facility];  Service: Endoscopy;  Laterality: N/A;  . BREAST SURGERY     biopsy '47  . CATARACT EXTRACTION Bilateral    last '05  . CHOLECYSTECTOMY     laparoscopic'10  . ESOPHAGOGASTRODUODENOSCOPY     with dilatation x3  . ESOPHAGOGASTRODUODENOSCOPY (EGD) WITH PROPOFOL N/A 05/10/2015   Procedure: ESOPHAGOGASTRODUODENOSCOPY (EGD) WITH PROPOFOL;  Surgeon: Louis Meckelobert D Kaplan, MD;  Location: [facility];  Service: Endoscopy;  Laterality: N/A;  balloon dilation  . ESOPHAGOGASTRODUODENOSCOPY (EGD) WITH PROPOFOL N/A 08/02/2015   Procedure: ESOPHAGOGASTRODUODENOSCOPY (EGD) WITH PROPOFOL;  Surgeon: Louis Meckelobert D Kaplan, MD;  Location: [facility];  Service: Endoscopy;  Laterality: N/A;  . LUMBAR LAMINECTOMY    . NASAL RECONSTRUCTION WITH SEPTAL REPAIR     '10  . RECTAL PROLAPSE REPAIR     '06  . skin graf     '98    Family History  Problem Relation Age of Onset  . Hypertension Son   . Asthma  Mother   . Abdominal Wall Hernia Mother   . Stomach cancer Mother     Allergies  Allergen Reactions  . Penicillins Rash    Has patient had a PCN reaction causing immediate rash, facial/tongue/throat swelling, SOB or lightheadedness with hypotension: Yes Has patient had a PCN reaction causing severe rash involving mucus membranes or skin necrosis: Unknown Has patient had a PCN reaction that required hospitalization: Unknown Has patient had a PCN reaction occurring within the last 10 years: No If all of the above answers are "NO", then may proceed with Cephalosporin use.     Current Outpatient Prescriptions on File Prior to Visit  Medication Sig Dispense Refill  . ALPRAZolam (XANAX) 0.25 MG  tablet Take 1 tablet (0.25 mg total) by mouth 3 (three) times daily as needed. 90 tablet 2  . Calcium Carbonate-Vitamin D (CALTRATE 600+D) 600-400 MG-UNIT per chew tablet Chew 1 tablet by mouth every morning.     . cyanocobalamin (,VITAMIN B-12,) 1000 MCG/ML injection INJECT 1 ML IN THE MUSCLE ONCE MONTHLY 10 mL 9  . escitalopram (LEXAPRO) 5 MG tablet TAKE 1 TABLET BY MOUTH DAILY 90 tablet 1  . Ipratropium-Albuterol (COMBIVENT) 20-100 MCG/ACT AERS respimat Inhale 1 puff into the lungs every 6 (six) hours as needed for wheezing or shortness of breath. 1 Inhaler 5  . mirtazapine (REMERON) 15 MG tablet TAKE 1 TABLET BY MOUTH EVERY NIGHT AT BEDTIME 90 tablet 0  . Multiple Vitamins-Minerals (ICAPS AREDS 2) CAPS Take 1 capsule by mouth 2 (two) times daily.    . OXYGEN Oxygen 3lpm 24/7    . Polyethyl Glycol-Propyl Glycol (SYSTANE OP) Place 1 drop into both eyes 4 (four) times daily as needed (for dry eyes).     . SYMBICORT 160-4.5 MCG/ACT inhaler INHALE 2 PUFFS BY MOUTH TWICE DAILY. 10.2 g 5  . naproxen sodium (ALEVE) 220 MG tablet Take 220 mg by mouth as needed.     No current facility-administered medications on file prior to visit.     BP 126/72   Temp 98.6 F (37 C) (Oral)   Ht 5\' 5"  (1.651 m)   Wt 99 lb 14.4 oz (45.3 kg)   BMI 16.62 kg/m       Objective:   Physical Exam  Constitutional: She is oriented to person, place, and time. She appears well-developed and well-nourished. No distress.  Cardiovascular: Normal rate, normal heart sounds and intact distal pulses.  Exam reveals no gallop and no friction rub.   No murmur heard. Pulmonary/Chest: Effort normal and breath sounds normal. No respiratory distress. She exhibits no tenderness.  Decreased breath sounds throughout. No rhonchi or rales  Neurological: She is alert and oriented to person, place, and time.  Skin: Skin is warm and dry. No rash noted. She is not diaphoretic. No erythema. No pallor.  Psychiatric: She has a normal mood  and affect. Her behavior is normal. Judgment and thought content normal.  Vitals reviewed.     Assessment & Plan:  1. Cough - Due to recent pneumonia treatment and co morbidities, while first get chest x ray and then treat depending on findings.  - DG Chest 2 View; Future - Follow up with PCP as needed  * Update:  Chest x ray shows   IMPRESSION: 1. New left basilar opacity, probable bronchopneumonia in this setting. 2. Emphysema.  This is the same area that her previous pneumonia was seen in. I have prescribed Levaquin 750 mg daily x 5 days and prednisone 20 mg  daily x 7days  I advised to follow up with Dr. Kirtland Bouchard in a week. Consider CT of chest for mass causing pneumonia.  - Repeat chest x ray in one month - order entered.   Shirline Frees, NP   Shirline Frees, NP

## 2016-10-12 ENCOUNTER — Ambulatory Visit: Payer: Federal, State, Local not specified - PPO | Admitting: Internal Medicine

## 2016-10-13 ENCOUNTER — Encounter: Payer: Self-pay | Admitting: Internal Medicine

## 2016-10-13 ENCOUNTER — Other Ambulatory Visit (INDEPENDENT_AMBULATORY_CARE_PROVIDER_SITE_OTHER): Payer: Federal, State, Local not specified - PPO

## 2016-10-13 ENCOUNTER — Ambulatory Visit (INDEPENDENT_AMBULATORY_CARE_PROVIDER_SITE_OTHER): Payer: Federal, State, Local not specified - PPO | Admitting: Internal Medicine

## 2016-10-13 ENCOUNTER — Ambulatory Visit (INDEPENDENT_AMBULATORY_CARE_PROVIDER_SITE_OTHER)
Admission: RE | Admit: 2016-10-13 | Discharge: 2016-10-13 | Disposition: A | Discharge status: Home / Self Care | Payer: Federal, State, Local not specified - PPO | Source: Ambulatory Visit | Attending: Internal Medicine | Admitting: Internal Medicine

## 2016-10-13 VITALS — BP 106/60 | HR 69 | Ht 65.0 in | Wt 101.6 lb

## 2016-10-13 DIAGNOSIS — J918 Pleural effusion in other conditions classified elsewhere: Secondary | ICD-10-CM

## 2016-10-13 DIAGNOSIS — J9611 Chronic respiratory failure with hypoxia: Secondary | ICD-10-CM | POA: Diagnosis not present

## 2016-10-13 DIAGNOSIS — J189 Pneumonia, unspecified organism: Secondary | ICD-10-CM

## 2016-10-13 DIAGNOSIS — J479 Bronchiectasis, uncomplicated: Secondary | ICD-10-CM

## 2016-10-13 DIAGNOSIS — J449 Chronic obstructive pulmonary disease, unspecified: Secondary | ICD-10-CM | POA: Diagnosis not present

## 2016-10-13 LAB — CBC WITH DIFFERENTIAL/PLATELET
BASOS PCT: 0.2 % (ref 0.0–3.0)
Basophils Absolute: 0 10*3/uL (ref 0.0–0.1)
EOS ABS: 0.3 10*3/uL (ref 0.0–0.7)
EOS PCT: 1.7 % (ref 0.0–5.0)
HEMATOCRIT: 35.8 % — AB (ref 36.0–46.0)
HEMOGLOBIN: 11.9 g/dL — AB (ref 12.0–15.0)
LYMPHS PCT: 15.2 % (ref 12.0–46.0)
Lymphs Abs: 2.5 10*3/uL (ref 0.7–4.0)
MCHC: 33.3 g/dL (ref 30.0–36.0)
MCV: 92.2 fl (ref 78.0–100.0)
Monocytes Absolute: 1.7 10*3/uL — ABNORMAL HIGH (ref 0.1–1.0)
Monocytes Relative: 10.7 % (ref 3.0–12.0)
Neutro Abs: 11.7 10*3/uL — ABNORMAL HIGH (ref 1.4–7.7)
Neutrophils Relative %: 72.2 % (ref 43.0–77.0)
Platelets: 479 10*3/uL — ABNORMAL HIGH (ref 150.0–400.0)
RBC: 3.88 Mil/uL (ref 3.87–5.11)
RDW: 15.6 % — AB (ref 11.5–15.5)
WBC: 16.3 10*3/uL — AB (ref 4.0–10.5)

## 2016-10-13 LAB — SEDIMENTATION RATE: Sed Rate: 37 mm/hr — ABNORMAL HIGH (ref 0–30)

## 2016-10-13 NOTE — Progress Notes (Signed)
Subjective:    Patient ID: Sharon [name], female    DOB: November 13, 1923,    MRN: [id number]   Brief patient profile:  82  yowf quit smoking in 1980 with no minimal chronic doe assoc mild chronic nodular infiltrates dating back to 11/23/2008 with documented GOLD III criteria copd/ bronchiectasis by CT 09/2009 both lower lobes       Admit date: 07/09/2016 Discharge date: 07/12/2016  Discharge Diagnoses:  Principal Problem:   CAP (community acquired pneumonia)   Depression   Community acquired pneumonia   COPD (chronic obstructive pulmonary disease) with emphysema (HCC)   Sepsis (HCC)   Acute respiratory failure with hypoxia (HCC)   Hyponatremia      08/16/2016 1st [facility] office visit/Epic era/ Dashayla Theissen  Re s/p CAP in pt with bronchiectasis/ GOLD III COPD  Chief Complaint  Patient presents with  . Pulmonary Consult    Referred by Dr. Eleonore Chiquito for pleural effusion. Pt states that she has been dxed with PNA x 2 since July 2017. She has had cough and SOB. Cough is currently non prod.   baseline = MMRC2 = can't walk a nl pace on a flat grade s sob but does fine slow and flat eg [employer] shopping/ uses [facility] parking on symbicort 160 2bid and rare need for combivent  Acutely ill with hoarseness in [place] p getting off plan from [place] then dry cough and admit: Now on 02 at 80lpm 24/7 rec Please see patient coordinator before you leave today  to schedule venous doppler asap on Left> neg     Left  thoracentesis 08/17/16 =  1 liter on Left  WBC 1,020  L > P  Transudate with nl glucose/ cyt neg  Acute 09/26/16 p exp to sick 82 year old > green mucus, chills, fatigue > 10/04/16 ? LLL Pna > rx levaquin and better     10/13/2016  f/u ov/Bowyn Mercier re:  bronchiectasis/ GOLD III COPD f/u L effusion maint on symb 160 2bid and combivent prn  Chief Complaint  Patient presents with  . Follow-up    She states dxed with PNA 10/04/16- txed with levaquin.  She is still feeling very tired. She  has not been coughing much. Breathing is okay "as long as I am sitting".   doe = MMRC2  Main symptom now is fatigue   No obvious day to day or daytime variability or assoc excess/ purulent sputum or mucus plugs or hemoptysis or cp or chest tightness, subjective wheeze or overt sinus or hb symptoms. No unusual exp hx or h/o childhood pna/ asthma or knowledge of premature birth.  Sleeping ok without nocturnal  or early am exacerbation  of respiratory  c/o's or need for noct saba. Also denies any obvious fluctuation of symptoms with weather or environmental changes or other aggravating or alleviating factors except as outlined above   Current Medications, Allergies, Complete Past Medical History, Past Surgical History, Family History, and Social History were reviewed in [employer] record.  ROS  The following are not active complaints unless bolded sore throat, dysphagia, dental problems, itching, sneezing,  nasal congestion or excess/ purulent secretions, ear ache,   fever, chills, sweats, unintended wt loss, classically pleuritic or exertional cp,  orthopnea pnd or leg swelling, presyncope, palpitations, abdominal pain, anorexia, nausea, vomiting, diarrhea  or change in bowel or bladder habits, change in stools or urine, dysuria,hematuria,  rash, arthralgias, visual complaints, headache, numbness, weakness or ataxia or problems with walking or  coordination,  change in mood/affect or memory.                  Past Medical History: Osteoporosis B12 deficiency Osteoarthritis COPD    - PFT's December 03, 2009 FEV1 67 (40%) with 12% response to B2 and DLC0 55%    - HFA 25% June 29, 2010 > 75% August 11, 2010 > 75% November 03, 2010  Fx Right Hip and required ORIF June 19th 2011> 02 dependent since > d/c November 03, 2010  history of esophageal stricture...........................................Marland Kitchen.[place]terson Multiple pulmonary  nodules..................................................Marland Kitchen.Alegandro Macnaughton    -CT chest 10/13/09 assoc with bilateral lower lobe bronchiectasis    -CXR December 03, 2009 no change  > no change June 29, 2010            Objective:   Physical Exam   amb wf    10/13/2016   08/16/16 102 lb (46.3 kg)  08/15/16 101 lb 4 oz (45.9 kg)  07/28/16 99 lb 12.8 oz (45.3 kg)    Vital signs reviewed - note 80  % on RA on arrival   HEENT: nl dentition, turbinates, and oropharynx. Nl external ear canals without cough reflex   NECK :  without JVD/Nodes/TM/ nl carotid upstrokes bilaterally   LUNGS: no acc muscle use, distant bs bilaterally   CV:  RRR  no s3 or murmur or increase in P2, no edema   ABD:  soft and nontender with nl inspiratory excursion in the supine position. No bruits or organomegaly, bowel sounds nl  MS:  Nl gait/ ext warm without deformities, calf tenderness, cyanosis or clubbing No obvious joint restrictions - swelling resolved    SKIN: warm and dry without lesions    NEURO:  alert, approp, nl sensorium with  no motor deficits       I personally reviewed images and agree with radiology impression as follows:  CXR:   10/04/16 1. New left basilar opacity, probable bronchopneumonia in this setting. 2. Emphysema.   CXR PA and Lateral:   10/13/2016 :    I personally reviewed images and agree with radiology impression as follows:   Slight improvement in aeration at the left lung base. Persistent minimal opacity and left pleural effusion.    Labs ordered/ reviewed:      Chemistry      Component Value Date/Time   NA 134 (L) 07/28/2016 1516   K 4.1 07/28/2016 1516   CL 95 (L) 07/28/2016 1516   CO2 32 07/28/2016 1516   BUN 19 07/28/2016 1516   CREATININE 0.53 07/28/2016 1516      Component Value Date/Time   CALCIUM 9.7 07/28/2016 1516   ALKPHOS 70 07/28/2016 1516   AST 18 07/28/2016 1516   ALT 19 07/28/2016 1516   BILITOT 0.4 07/28/2016 1516        Lab  Results  Component Value Date   WBC 16.3 (H) 10/13/2016   HGB 11.9 (L) 10/13/2016   HCT 35.8 (L) 10/13/2016   MCV 92.2 10/13/2016   PLT 479.0 (H) 10/13/2016         Lab Results  Component Value Date   TSH 2.63 02/08/2015         Lab Results  Component Value Date   ESRSEDRATE 37 (H) 10/13/2016   ESRSEDRATE 48 (H) 08/16/2016                   Assessment & Plan:

## 2016-10-13 NOTE — Patient Instructions (Addendum)
Please remember to go to the lab and x-ray department downstairs for your tests - we will call you with the results when they are available.  Please see patient coordinator before you leave today  to schedule overnight pulse ox   If you are satisfied with your treatment plan,  let your doctor know and he/she can either refill your medications or you can return here when your prescription runs out.     If in any way you are not 100% satisfied,  please tell us.  If 100% better, tell your friends!  Pulmonary follow up is as needed

## 2016-10-14 DIAGNOSIS — J479 Bronchiectasis, uncomplicated: Secondary | ICD-10-CM | POA: Insufficient documentation

## 2016-10-14 NOTE — Assessment & Plan Note (Signed)
See CT chest 09/2009   May explain tendency to recurrent pna, utd on all vaccinations/ nothing more needed for now

## 2016-10-14 NOTE — Assessment & Plan Note (Signed)
Quit smoking 1980 -  PFT's December 03, 2009 FEV1 67 (40%) with 12% response to B2 and DLC0 55%  Adequate control on present rx, reviewed > no change in rx needed    I had an extended discussion with the patient reviewing all relevant studies completed to date and  lasting 15 to 20 minutes of a 25 minute visit    Each maintenance medication was reviewed in detail including most importantly the difference between maintenance and prns and under what circumstances the prns are to be triggered using an action plan format that is not reflected in the computer generated alphabetically organized AVS.    Please see instructions for details which were reviewed in writing and the patient given a copy highlighting the part that I personally wrote and discussed at today's ov.

## 2016-10-14 NOTE — Assessment & Plan Note (Signed)
CAP LUL >  admit 07/09/16  - ESR 08/16/2016 = 48  -  L thoracentesis 08/17/16 =  1 liter on Left  WBC 1,020  L > P  Transudate with nl glucose/ cyt neg - ESR 10/13/2016 37   Almost completely resolved with esr trending down so nothing more to do at this point / f/u prn

## 2016-10-14 NOTE — Assessment & Plan Note (Addendum)
On 02 2lpm since d/c from [facility] 07/12/16  sats ok on RA at rest 10/13/2016 so rec 2lpm hs and with activity with goal of keeping > 90%   Will repeat RA ono to see if still needs 02

## 2016-10-16 NOTE — Progress Notes (Signed)
lmtcb

## 2016-10-16 NOTE — Progress Notes (Signed)
Spoke with pt and notified of results per Dr. Wert. Pt verbalized understanding and denied any questions. 

## 2016-10-16 NOTE — Progress Notes (Signed)
LMTCB

## 2016-10-17 ENCOUNTER — Encounter: Payer: Self-pay | Admitting: Internal Medicine

## 2016-10-20 ENCOUNTER — Telehealth: Payer: Self-pay | Admitting: Internal Medicine

## 2016-10-20 MED ORDER — MOMETASONE FURO-FORMOTEROL FUM 200-5 MCG/ACT IN AERO: 2.0000 | INHALATION_SPRAY | Freq: Two times a day (BID) | RESPIRATORY_TRACT | 11 refills | Status: DC

## 2016-10-20 NOTE — Telephone Encounter (Signed)
Per MW- ONO still shows desat, so she needs to continue o2 at 2lpm  Spoke with pt and notified of results per Dr. Sherene SiresWert. Pt verbalized understanding and denied any questions. She is aware of Dulera rec and I have sent rx to pharm per her req

## 2016-10-20 NOTE — Telephone Encounter (Signed)
Patient returning call -pr °

## 2016-10-20 NOTE — Telephone Encounter (Signed)
Spoke with pt, requesting ONO results.  MW please advise on results.  Thanks!   Also, pt received a letter from [employer] stating that her Symbicort will no longer be covered- Covered alternatives- advair hfa, airduo respiclik, and dulera   MW please advise.  Thanks!   Patient Instructions   Please remember to go to the lab and x-ray department downstairs for your tests - we will call you with the results when they are available.   Please see patient coordinator before you leave today  to schedule overnight pulse ox    If you are satisfied with your treatment plan,  let your doctor know and he/she can either refill your medications or you can return here when your prescription runs out.      If in any way you are not 100% satisfied,  please tell us.  If 100% better, tell your friends!   Pulmonary follow up is as needed

## 2016-10-20 NOTE — Telephone Encounter (Signed)
Pt returned phone call. Contact # [phone].Charm RingsErica R Taylor

## 2016-10-20 NOTE — Telephone Encounter (Signed)
lmomtcb x1 

## 2016-10-20 NOTE — Telephone Encounter (Signed)
Haven't seen the ono dulera 200 2bid best fit

## 2016-10-23 ENCOUNTER — Other Ambulatory Visit: Payer: Self-pay | Admitting: Family Medicine

## 2016-10-23 ENCOUNTER — Telehealth: Payer: Self-pay | Admitting: Internal Medicine

## 2016-10-23 DIAGNOSIS — J9611 Chronic respiratory failure with hypoxia: Secondary | ICD-10-CM

## 2016-10-23 MED ORDER — CYANOCOBALAMIN 1000 MCG/ML IJ SOLN: INTRAMUSCULAR | 1 refills | Status: DC

## 2016-10-23 NOTE — Telephone Encounter (Signed)
Called and spoke to pt. Pt is requesting a smaller O2 system due to upcoming travel. Pt is requesting simplyGO POC. Pt uses [facility].   Dr. Sherene SiresWert please advise if ok to order.

## 2016-10-23 NOTE — Telephone Encounter (Signed)
Fine with me

## 2016-10-23 NOTE — Telephone Encounter (Signed)
Pharmacy called for pt to request a refill of cyanocobalamin (,VITAMIN B-12,) 1000 MCG/ML injection  Pt would like asap.

## 2016-10-24 NOTE — Telephone Encounter (Signed)
Order placed.  Pt aware.  Nothing further needed.  

## 2016-10-27 ENCOUNTER — Telehealth: Payer: Self-pay | Admitting: Internal Medicine

## 2016-10-27 NOTE — Telephone Encounter (Signed)
Please see message. °

## 2016-10-27 NOTE — Telephone Encounter (Signed)
Spoke to pt, told her please mail letter from third-party payer and we will send needed information per Dr.K. Pt verbalized understanding. Also asked pt if she had overnight oximetry evaluation as suggested by pulmonary medicine?  This information is needed to assess need for long-term oxygen therapy. Pt said yes she did and she failed. Told pt okay I will keep an eye out for the papers. Pt verbalized understanding.

## 2016-10-27 NOTE — Telephone Encounter (Signed)
Please call patient and ask her to mail letter from third-party payer and we will send needed information  Has patient had overnight oximetry evaluation as suggested by pulmonary medicine?  This information is needed to assess need for long-term oxygen therapy

## 2016-10-27 NOTE — Telephone Encounter (Signed)
Pt states she received a letter requesting the dr to send more information about her oxygen and the medical necessity, length of time to be used and purchase price.  Pt would like a call back from Dr Sharon BouchardK to discuss.

## 2016-11-01 ENCOUNTER — Telehealth: Payer: Self-pay | Admitting: Internal Medicine

## 2016-11-01 NOTE — Telephone Encounter (Signed)
Patient needs a statement from PCP for insurance company.  Patient's son brought in a statement about what is needed from the insurance company.  I placed the statement in the dr's folder.

## 2016-11-02 NOTE — Telephone Encounter (Signed)
Received information from pt. Needing Letter. See other message.

## 2016-11-02 NOTE — Telephone Encounter (Signed)
Explanation paper, needing letter is in your basket.

## 2016-11-14 ENCOUNTER — Encounter: Payer: Self-pay | Admitting: Internal Medicine

## 2016-11-14 ENCOUNTER — Ambulatory Visit (INDEPENDENT_AMBULATORY_CARE_PROVIDER_SITE_OTHER): Payer: Federal, State, Local not specified - PPO | Admitting: Internal Medicine

## 2016-11-14 VITALS — BP 120/76 | HR 67 | Temp 97.7°F | Resp 18 | Ht 65.0 in | Wt 102.2 lb

## 2016-11-14 DIAGNOSIS — J449 Chronic obstructive pulmonary disease, unspecified: Secondary | ICD-10-CM | POA: Diagnosis not present

## 2016-11-14 DIAGNOSIS — F320 Major depressive disorder, single episode, mild: Secondary | ICD-10-CM | POA: Diagnosis not present

## 2016-11-14 DIAGNOSIS — J9611 Chronic respiratory failure with hypoxia: Secondary | ICD-10-CM

## 2016-11-14 DIAGNOSIS — J479 Bronchiectasis, uncomplicated: Secondary | ICD-10-CM | POA: Diagnosis not present

## 2016-11-14 NOTE — Progress Notes (Signed)
Subjective:    Patient ID: Sharon AngerJoanne E Resnick, female    DOB: 02-20-1923, 82 y.o.   MRN: [id number]  HPI  82 year old patient who is seen today in follow-up.  She was seen by pulmonary medicine one month ago. She has chronic respiratory failure secondary to COPD and bronchiectasis.  Remains on chronic nasal O2.  She uses oxygen nocturnally and also with activity due to desaturation. She is quite anxious to discontinue oxygen therapy due to the limits it places on her wishes to travel. Her pulmonary status has been stable. Recent nocturnal oximetry revealed desaturation and need for chronic oxygen therapy.  Past Medical History:  Diagnosis Date  . Anemia   . B12 DEFICIENCY 03/31/2008  . COPD 11/27/2008  . ESOPHAGEAL STRICTURE 01/29/2009  . MASS, LUNG 10/13/2009  . NASAL FRACTURE 08/12/2009  . OSTEOARTHRITIS 03/31/2008   Hips  . OSTEOPOROSIS 12/27/2007  . PEDAL EDEMA 12/27/2007  . Pneumonia 07/10/2016  . PULMONARY NODULE 10/21/2009  . RESPIRATORY FAILURE, CHRONIC 06/29/2010  . Shortness of breath dyspnea      Social History   Social History  . Marital status: Widowed    Spouse name: N/A  . Number of children: N/A  . Years of education: N/A   Occupational History  . Not on file.   Social History Main Topics  . Smoking status: Former Smoker    Packs/day: 0.50    Years: 35.00    Quit date: 12/18/1978  . Smokeless tobacco: Never Used  . Alcohol use 0.0 oz/week     Comment: couple of gin and tonics daily  . Drug use: No  . Sexual activity: Not on file   Other Topics Concern  . Not on file   Social History Narrative  . No narrative on file    Past Surgical History:  Procedure Laterality Date  . APPENDECTOMY     '38  . BALLOON DILATION N/A 05/10/2015   Procedure: BALLOON DILATION;  Surgeon: Louis Meckelobert D Kaplan, MD;  [facility];  Service: Endoscopy;  Laterality: N/A;  . BALLOON DILATION N/A 08/02/2015   Procedure: BALLOON DILATION;  Surgeon: Louis Meckelobert D Kaplan, MD;   Location: [facility];  Service: Endoscopy;  Laterality: N/A;  . BREAST SURGERY     biopsy '47  . CATARACT EXTRACTION Bilateral    last '05  . CHOLECYSTECTOMY     laparoscopic'10  . ESOPHAGOGASTRODUODENOSCOPY     with dilatation x3  . ESOPHAGOGASTRODUODENOSCOPY (EGD) WITH PROPOFOL N/A 05/10/2015   Procedure: ESOPHAGOGASTRODUODENOSCOPY (EGD) WITH PROPOFOL;  Surgeon: Louis Meckelobert D Kaplan, MD;  Location: [facility];  Service: Endoscopy;  Laterality: N/A;  balloon dilation  . ESOPHAGOGASTRODUODENOSCOPY (EGD) WITH PROPOFOL N/A 08/02/2015   Procedure: ESOPHAGOGASTRODUODENOSCOPY (EGD) WITH PROPOFOL;  Surgeon: Louis Meckelobert D Kaplan, MD;  Location: [facility];  Service: Endoscopy;  Laterality: N/A;  . LUMBAR LAMINECTOMY    . NASAL RECONSTRUCTION WITH SEPTAL REPAIR     '10  . RECTAL PROLAPSE REPAIR     '06  . skin graf     '98    Family History  Problem Relation Age of Onset  . Hypertension Son   . Asthma Mother   . Abdominal Wall Hernia Mother   . Stomach cancer Mother     Allergies  Allergen Reactions  . Penicillins Rash    Has patient had a PCN reaction causing immediate rash, facial/tongue/throat swelling, SOB or lightheadedness with hypotension: Yes Has patient had a PCN reaction causing severe rash involving mucus membranes or skin necrosis: Unknown  Has patient had a PCN reaction that required hospitalization: Unknown Has patient had a PCN reaction occurring within the last 10 years: No If all of the above answers are "NO", then may proceed with Cephalosporin use.     Current Outpatient Prescriptions on File Prior to Visit  Medication Sig Dispense Refill  . ALPRAZolam (XANAX) 0.25 MG tablet Take 1 tablet (0.25 mg total) by mouth 3 (three) times daily as needed. 90 tablet 2  . Calcium Carbonate-Vitamin D (CALTRATE 600+D) 600-400 MG-UNIT per chew tablet Chew 1 tablet by mouth every morning.     . cyanocobalamin (,VITAMIN B-12,) 1000 MCG/ML injection INJECT 1 ML IN THE MUSCLE ONCE  MONTHLY 10 mL 1  . escitalopram (LEXAPRO) 5 MG tablet TAKE 1 TABLET BY MOUTH DAILY 90 tablet 1  . Ipratropium-Albuterol (COMBIVENT) 20-100 MCG/ACT AERS respimat Inhale 1 puff into the lungs every 6 (six) hours as needed for wheezing or shortness of breath. 1 Inhaler 5  . mirtazapine (REMERON) 15 MG tablet TAKE 1 TABLET BY MOUTH EVERY NIGHT AT BEDTIME 90 tablet 0  . mometasone-formoterol (DULERA) 200-5 MCG/ACT AERO Inhale 2 puffs into the lungs 2 (two) times daily. 1 Inhaler 11  . Multiple Vitamins-Minerals (ICAPS AREDS 2) CAPS Take 1 capsule by mouth 2 (two) times daily.    . naproxen sodium (ALEVE) 220 MG tablet Take 220 mg by mouth as needed.    . OXYGEN Oxygen 3lpm 24/7    . Polyethyl Glycol-Propyl Glycol (SYSTANE OP) Place 1 drop into both eyes 4 (four) times daily as needed (for dry eyes).     . SYMBICORT 160-4.5 MCG/ACT inhaler INHALE 2 PUFFS BY MOUTH TWICE DAILY. 10.2 g 5   No current facility-administered medications on file prior to visit.     BP 120/76 (BP Location: Left Arm, Patient Position: Sitting, Cuff Size: Normal)   Pulse 67   Temp 97.7 F (36.5 C) (Oral)   Resp 18   Ht 5\' 5"  (1.651 m)   Wt 102 lb 4 oz (46.4 kg)   SpO2 95%   BMI 17.02 kg/m     Review of Systems  Constitutional: Negative.   HENT: Negative for congestion, dental problem, hearing loss, rhinorrhea, sinus pressure, sore throat and tinnitus.   Eyes: Negative for pain, discharge and visual disturbance.  Respiratory: Positive for shortness of breath. Negative for cough.   Cardiovascular: Negative for chest pain, palpitations and leg swelling.  Gastrointestinal: Negative for abdominal distention, abdominal pain, blood in stool, constipation, diarrhea, nausea and vomiting.  Genitourinary: Negative for difficulty urinating, dysuria, flank pain, frequency, hematuria, pelvic pain, urgency, vaginal bleeding, vaginal discharge and vaginal pain.  Musculoskeletal: Negative for arthralgias, gait problem and joint  swelling.  Skin: Negative for rash.  Neurological: Negative for dizziness, syncope, speech difficulty, weakness, numbness and headaches.  Hematological: Negative for adenopathy.  Psychiatric/Behavioral: Negative for agitation, behavioral problems and dysphoric mood. The patient is not nervous/anxious.        Objective:   Physical Exam  Constitutional: She is oriented to person, place, and time. She appears well-developed and well-nourished.  Elderly, frail Alert, no acute distress  Blood pressure 120/76Ulcerated 67 O2 saturation 95% on 2 L of oxygen per minute  HENT:  Head: Normocephalic.  Right Ear: External ear normal.  Left Ear: External ear normal.  Mouth/Throat: Oropharynx is clear and moist.  Eyes: Conjunctivae and EOM are normal. Pupils are equal, round, and reactive to light.  Neck: Normal range of motion. Neck supple. No thyromegaly present.  Cardiovascular: Normal rate, regular rhythm, normal heart sounds and intact distal pulses.   Pulmonary/Chest: Effort normal.  Diminished breath sounds  Abdominal: Soft. Bowel sounds are normal. She exhibits no mass. There is no tenderness.  Musculoskeletal: Normal range of motion. She exhibits edema.  Trace ankle edema  Lymphadenopathy:    She has no cervical adenopathy.  Neurological: She is alert and oriented to person, place, and time.  Skin: Skin is warm and dry. No rash noted.  Psychiatric: She has a normal mood and affect. Her behavior is normal.          Assessment & Plan:   Chronic hypoxic respiratory failure.  Continue maintenance medications as well as chronic oxygen therapy COPD/bronchiectasis Vitamin B12 deficiency History pneumonia History depression, stable  No change in medical regimen Pulmonary follow-up as scheduled Follow-up here 6 months or as needed  NIKEKWIATKOWSKI,Kimberleigh Mehan FRANK

## 2016-11-14 NOTE — Progress Notes (Signed)
Pre visit review using our clinic review tool, if applicable. No additional management support is needed unless otherwise documented below in the visit note.   Pt presented to office with oxygen on a 2 L/min via nasal cannula.

## 2016-11-14 NOTE — Patient Instructions (Signed)
Limit your sodium (Salt) intake  Pulmonary follow-up as scheduled  Continue home oxygen therapy  Follow-up here in 6 months or as needed

## 2016-11-22 ENCOUNTER — Telehealth: Payer: Self-pay | Admitting: Internal Medicine

## 2016-11-22 DIAGNOSIS — J9611 Chronic respiratory failure with hypoxia: Secondary | ICD-10-CM

## 2016-11-22 NOTE — Telephone Encounter (Signed)
Spoke with Sharon [name] is aware that MW has approved for order to be placed for Simply go POC with [facility] to travel with during the holiday season. Sharon FrizzleMatt [facility]. Order placed and nothing more needed at this time.

## 2016-11-22 NOTE — Telephone Encounter (Signed)
Called and spoke with pts son--Sharon [name]--  He stated that the pt is currently on 2 liters of oxygen and they will be traveling---on airplane to visit family this christmas.  Pt is needing to have a POC to travel with.  [employer] advised the pts son that they will cover this if the doctor writes for it.  [facility] and they are interested in the simply go constant flow since the pt is on 2 liters.  MW please advise. thanks

## 2016-11-22 NOTE — Telephone Encounter (Signed)
Fine with me

## 2016-11-23 ENCOUNTER — Telehealth: Payer: Self-pay | Admitting: Internal Medicine

## 2016-11-23 DIAGNOSIS — J9611 Chronic respiratory failure with hypoxia: Secondary | ICD-10-CM

## 2016-11-23 NOTE — Telephone Encounter (Signed)
Spoke with Susy FrizzleMatt, the pt's son  [facility] denied order for simply go for travel b/c the system only goes up to 2 lpm and they have documented that she uses 3lpm  She only uses 2 lpm now, so [facility]  [name] aware and nothing further needed

## 2016-11-28 ENCOUNTER — Other Ambulatory Visit: Payer: Self-pay | Admitting: Internal Medicine

## 2017-03-26 ENCOUNTER — Other Ambulatory Visit: Payer: Self-pay | Admitting: Internal Medicine

## 2017-04-11 ENCOUNTER — Other Ambulatory Visit: Payer: Self-pay | Admitting: Internal Medicine

## 2017-05-15 ENCOUNTER — Ambulatory Visit (INDEPENDENT_AMBULATORY_CARE_PROVIDER_SITE_OTHER): Payer: Federal, State, Local not specified - PPO | Admitting: Internal Medicine

## 2017-05-15 ENCOUNTER — Encounter: Payer: Self-pay | Admitting: Internal Medicine

## 2017-05-15 VITALS — BP 128/78 | HR 78 | Temp 97.7°F | Ht 65.0 in | Wt 98.4 lb

## 2017-05-15 DIAGNOSIS — R600 Localized edema: Secondary | ICD-10-CM | POA: Diagnosis not present

## 2017-05-15 DIAGNOSIS — J9611 Chronic respiratory failure with hypoxia: Secondary | ICD-10-CM | POA: Diagnosis not present

## 2017-05-15 MED ORDER — IPRATROPIUM-ALBUTEROL 20-100 MCG/ACT IN AERS: 1.0000 | INHALATION_SPRAY | Freq: Four times a day (QID) | RESPIRATORY_TRACT | 5 refills | Status: DC | PRN

## 2017-05-15 NOTE — Progress Notes (Signed)
Subjective:    Patient ID: Sharon [name], female    DOB: 08/13/1923, 83 y.o.   MRN: [id number]  HPI  83 year old patient who is seen today for follow-up.  She has a history of chronic respiratory failure with hypoxemia.  Remains on chronic nasal O2.  She has a history of mild depression, which has been stable.  Only complaint today is peeling edema, which has been chronic.  Her pulmonary status has been stable. She is planning on 2 trips later this summer.  She does have a daughter visiting from [place]  Past Medical History:  Diagnosis Date  . Anemia   . B12 DEFICIENCY 03/31/2008  . COPD 11/27/2008  . ESOPHAGEAL STRICTURE 01/29/2009  . MASS, LUNG 10/13/2009  . NASAL FRACTURE 08/12/2009  . OSTEOARTHRITIS 03/31/2008   Hips  . OSTEOPOROSIS 12/27/2007  . PEDAL EDEMA 12/27/2007  . Pneumonia 07/10/2016  . PULMONARY NODULE 10/21/2009  . RESPIRATORY FAILURE, CHRONIC 06/29/2010  . Shortness of breath dyspnea      Social History   Social History  . Marital status: Widowed    Spouse name: N/A  . Number of children: N/A  . Years of education: N/A   Occupational History  . Not on file.   Social History Main Topics  . Smoking status: Former Smoker    Packs/day: 0.50    Years: 35.00    Quit date: 12/18/1978  . Smokeless tobacco: Never Used  . Alcohol use 0.0 oz/week     Comment: couple of gin and tonics daily  . Drug use: No  . Sexual activity: Not on file   Other Topics Concern  . Not on file   Social History Narrative  . No narrative on file    Past Surgical History:  Procedure Laterality Date  . APPENDECTOMY     '38  . BALLOON DILATION N/A 05/10/2015   Procedure: BALLOON DILATION;  Surgeon: Louis Meckel, MD;  [facility];  Service: Endoscopy;  Laterality: N/A;  . BALLOON DILATION N/A 08/02/2015   Procedure: BALLOON DILATION;  Surgeon: Louis Meckel, MD;  Location: [facility];  Service: Endoscopy;  Laterality: N/A;  . BREAST SURGERY     biopsy '47  .  CATARACT EXTRACTION Bilateral    last '05  . CHOLECYSTECTOMY     laparoscopic'10  . ESOPHAGOGASTRODUODENOSCOPY     with dilatation x3  . ESOPHAGOGASTRODUODENOSCOPY (EGD) WITH PROPOFOL N/A 05/10/2015   Procedure: ESOPHAGOGASTRODUODENOSCOPY (EGD) WITH PROPOFOL;  Surgeon: Louis Meckel, MD;  Location: [facility];  Service: Endoscopy;  Laterality: N/A;  balloon dilation  . ESOPHAGOGASTRODUODENOSCOPY (EGD) WITH PROPOFOL N/A 08/02/2015   Procedure: ESOPHAGOGASTRODUODENOSCOPY (EGD) WITH PROPOFOL;  Surgeon: Louis Meckel, MD;  Location: [facility];  Service: Endoscopy;  Laterality: N/A;  . LUMBAR LAMINECTOMY    . NASAL RECONSTRUCTION WITH SEPTAL REPAIR     '10  . RECTAL PROLAPSE REPAIR     '06  . skin graf     '98    Family History  Problem Relation Age of Onset  . Hypertension Son   . Asthma Mother   . Abdominal Wall Hernia Mother   . Stomach cancer Mother     Allergies  Allergen Reactions  . Penicillins Rash    Has patient had a PCN reaction causing immediate rash, facial/tongue/throat swelling, SOB or lightheadedness with hypotension: Yes Has patient had a PCN reaction causing severe rash involving mucus membranes or skin necrosis: Unknown Has patient had a PCN reaction that required hospitalization:  Unknown Has patient had a PCN reaction occurring within the last 10 years: No If all of the above answers are "NO", then may proceed with Cephalosporin use.     Current Outpatient Prescriptions on File Prior to Visit  Medication Sig Dispense Refill  . ALPRAZolam (XANAX) 0.25 MG tablet TAKE 1 TABLET BY MOUTH THREE TIMES DAILY AS NEEDED 90 tablet 0  . Calcium Carbonate-Vitamin D (CALTRATE 600+D) 600-400 MG-UNIT per chew tablet Chew 1 tablet by mouth every morning.     . cyanocobalamin (,VITAMIN B-12,) 1000 MCG/ML injection INJECT 1 ML IN THE MUSCLE ONCE MONTHLY 10 mL 1  . escitalopram (LEXAPRO) 5 MG tablet TAKE 1 TABLET BY MOUTH DAILY 90 tablet 1  . mometasone-formoterol  (DULERA) 200-5 MCG/ACT AERO Inhale 2 puffs into the lungs 2 (two) times daily. 1 Inhaler 11  . Multiple Vitamins-Minerals (ICAPS AREDS 2) CAPS Take 1 capsule by mouth 2 (two) times daily.    . naproxen sodium (ALEVE) 220 MG tablet Take 220 mg by mouth as needed.    . OXYGEN Oxygen 3lpm 24/7    . Polyethyl Glycol-Propyl Glycol (SYSTANE OP) Place 1 drop into both eyes 4 (four) times daily as needed (for dry eyes).     . mirtazapine (REMERON) 15 MG tablet TAKE 1 TABLET BY MOUTH EVERY NIGHT AT BEDTIME (Patient not taking: Reported on 05/15/2017) 90 tablet 1   No current facility-administered medications on file prior to visit.     BP 128/78 (BP Location: Left Arm, Patient Position: Sitting, Cuff Size: Normal)   Pulse 78   Temp 97.7 F (36.5 C) (Oral)   Ht 5\' 5"  (1.651 m)   Wt 98 lb 6.4 oz (44.6 kg)   SpO2 92%   BMI 16.37 kg/m     Review of Systems  Constitutional: Negative.   HENT: Negative for congestion, dental problem, hearing loss, rhinorrhea, sinus pressure, sore throat and tinnitus.   Eyes: Negative for pain, discharge and visual disturbance.  Respiratory: Positive for shortness of breath. Negative for cough.   Cardiovascular: Positive for leg swelling. Negative for chest pain and palpitations.  Gastrointestinal: Negative for abdominal distention, abdominal pain, blood in stool, constipation, diarrhea, nausea and vomiting.  Genitourinary: Negative for difficulty urinating, dysuria, flank pain, frequency, hematuria, pelvic pain, urgency, vaginal bleeding, vaginal discharge and vaginal pain.  Musculoskeletal: Negative for arthralgias, gait problem and joint swelling.  Skin: Negative for rash.  Neurological: Negative for dizziness, syncope, speech difficulty, weakness, numbness and headaches.  Hematological: Negative for adenopathy.  Psychiatric/Behavioral: Negative for agitation, behavioral problems and dysphoric mood. The patient is not nervous/anxious.        Objective:    Physical Exam  Constitutional: She is oriented to person, place, and time. She appears well-developed and well-nourished.  Elderly and frail Alert and in no distress Blood pressure well controlled  HENT:  Head: Normocephalic.  Right Ear: External ear normal.  Left Ear: External ear normal.  Mouth/Throat: Oropharynx is clear and moist.  Eyes: Conjunctivae and EOM are normal. Pupils are equal, round, and reactive to light.  Neck: Normal range of motion. Neck supple. No thyromegaly present.  Cardiovascular: Normal rate, regular rhythm, normal heart sounds and intact distal pulses.   Pulmonary/Chest: Effort normal and breath sounds normal.  Abdominal: Soft. Bowel sounds are normal. She exhibits no mass. There is no tenderness.  Musculoskeletal: Normal range of motion. She exhibits edema.  Left pedal edema Surgical scar involving the left upper medial ankle area  Lymphadenopathy:  She has no cervical adenopathy.  Neurological: She is alert and oriented to person, place, and time.  Skin: Skin is warm and dry. No rash noted.  Psychiatric: She has a normal mood and affect. Her behavior is normal.          Assessment & Plan:   Advanced COPD with chronic respiratory failure.  Continue supplemental O2 Pedal edema Osteoarthritis, stable History of B12 deficiency.  Continue B12 injections monthly  Follow-up 6 months  Sadira Standard Homero FellersFRANK

## 2017-05-15 NOTE — Patient Instructions (Signed)
Limit your sodium (Salt) intake  Keep left leg elevated as much as possible  Return in 6 months for follow-up

## 2017-05-16 ENCOUNTER — Other Ambulatory Visit: Payer: Self-pay | Admitting: Internal Medicine

## 2017-06-05 ENCOUNTER — Other Ambulatory Visit: Payer: Self-pay | Admitting: Internal Medicine

## 2017-06-25 ENCOUNTER — Encounter: Payer: Self-pay | Admitting: Internal Medicine

## 2017-06-25 ENCOUNTER — Ambulatory Visit (INDEPENDENT_AMBULATORY_CARE_PROVIDER_SITE_OTHER): Payer: Federal, State, Local not specified - PPO | Admitting: Internal Medicine

## 2017-06-25 VITALS — BP 122/68 | HR 88 | Temp 98.4°F | Ht 65.0 in | Wt 98.6 lb

## 2017-06-25 DIAGNOSIS — J449 Chronic obstructive pulmonary disease, unspecified: Secondary | ICD-10-CM | POA: Diagnosis not present

## 2017-06-25 DIAGNOSIS — J470 Bronchiectasis with acute lower respiratory infection: Secondary | ICD-10-CM | POA: Diagnosis not present

## 2017-06-25 DIAGNOSIS — J9611 Chronic respiratory failure with hypoxia: Secondary | ICD-10-CM

## 2017-06-25 MED ORDER — LEVOFLOXACIN 500 MG PO TABS: 500.0000 mg | ORAL_TABLET | Freq: Every day | ORAL | 0 refills | Status: DC

## 2017-06-25 NOTE — Patient Instructions (Addendum)
WE NOW OFFER   [facility] FAST TRACK!!!  SAME DAY Appointments for ACUTE CARE  Such as: Sprains, Injuries, cuts, abrasions, rashes, muscle pain, joint pain, back pain Colds, flu, sore throats, headache, allergies, cough, fever  Ear pain, sinus and eye infections Abdominal pain, nausea, vomiting, diarrhea, upset stomach Animal/insect bites  3 Easy Ways to Schedule: Walk-In Scheduling Call in scheduling Mychart Sign-up: [email or web address]  Take your antibiotic as prescribed until ALL of it is gone, but stop if you develop a rash, swelling, or any side effects of the medication.  Contact our office as soon as possible if  there are side effects of the medication.  Report to the hospital emergency department for further evaluation if any clinical worsening   Hydrate and Humidify  Drink enough water to keep your urine clear or pale yellow. Staying hydrated will help to thin your mucus.  Use a cool mist humidifier to keep the humidity level in your home above 50%.  Inhale steam for 10-15 minutes, 3-4 times a day or as told by your health care provider. You can do this in the bathroom while a hot shower is running. Rest  Rest as much as possible.  Sleep with your head raised (elevated).    Take over-the-counter expectorants and cough medications such as  Mucinex DM.  Call if there is no improvement in 5 to 7 days; If  you develop worsening cough, fever, or new symptoms, such as shortness of breath or chest pain report to the emergency department for further evaluation.

## 2017-06-25 NOTE — Progress Notes (Signed)
Subjective:    Patient ID: Sharon [name], female    DOB: Jan 10, 1923, 83 y.o.   MRN: [id number]  HPI  83 year old patient who has a history of COPD GOLD  3, chronic hypoxic respiratory failure on chronic supplemental O2.  She also has a history of bronchiectasis.  She was stable until 2 days ago, when she developed increasing chest congestion, cough, weakness, shortness of breath and chills.  No documented fever.  She has felt unwell and has not been able to sleep due to incessant cough.  Has not had any worsening hypoxemia. Cough is largely nonproductive.  She has recently returned from a family vacation in [place] ( via private automobile).  Past Medical History:  Diagnosis Date  . Anemia   . B12 DEFICIENCY 03/31/2008  . COPD 11/27/2008  . ESOPHAGEAL STRICTURE 01/29/2009  . MASS, LUNG 10/13/2009  . NASAL FRACTURE 08/12/2009  . OSTEOARTHRITIS 03/31/2008   Hips  . OSTEOPOROSIS 12/27/2007  . PEDAL EDEMA 12/27/2007  . Pneumonia 07/10/2016  . PULMONARY NODULE 10/21/2009  . RESPIRATORY FAILURE, CHRONIC 06/29/2010  . Shortness of breath dyspnea      Social History   Social History  . Marital status: Widowed    Spouse name: N/A  . Number of children: N/A  . Years of education: N/A   Occupational History  . Not on file.   Social History Main Topics  . Smoking status: Former Smoker    Packs/day: 0.50    Years: 35.00    Quit date: 12/18/1978  . Smokeless tobacco: Never Used  . Alcohol use 0.0 oz/week     Comment: couple of gin and tonics daily  . Drug use: No  . Sexual activity: Not on file   Other Topics Concern  . Not on file   Social History Narrative  . No narrative on file    Past Surgical History:  Procedure Laterality Date  . APPENDECTOMY     '38  . BALLOON DILATION N/A 05/10/2015   Procedure: BALLOON DILATION;  Surgeon: Louis Meckelobert D Kaplan, MD;  [facility];  Service: Endoscopy;  Laterality: N/A;  . BALLOON DILATION N/A 08/02/2015   Procedure: BALLOON  DILATION;  Surgeon: Louis Meckelobert D Kaplan, MD;  Location: [facility];  Service: Endoscopy;  Laterality: N/A;  . BREAST SURGERY     biopsy '47  . CATARACT EXTRACTION Bilateral    last '05  . CHOLECYSTECTOMY     laparoscopic'10  . ESOPHAGOGASTRODUODENOSCOPY     with dilatation x3  . ESOPHAGOGASTRODUODENOSCOPY (EGD) WITH PROPOFOL N/A 05/10/2015   Procedure: ESOPHAGOGASTRODUODENOSCOPY (EGD) WITH PROPOFOL;  Surgeon: Louis Meckelobert D Kaplan, MD;  Location: [facility];  Service: Endoscopy;  Laterality: N/A;  balloon dilation  . ESOPHAGOGASTRODUODENOSCOPY (EGD) WITH PROPOFOL N/A 08/02/2015   Procedure: ESOPHAGOGASTRODUODENOSCOPY (EGD) WITH PROPOFOL;  Surgeon: Louis Meckelobert D Kaplan, MD;  Location: [facility];  Service: Endoscopy;  Laterality: N/A;  . LUMBAR LAMINECTOMY    . NASAL RECONSTRUCTION WITH SEPTAL REPAIR     '10  . RECTAL PROLAPSE REPAIR     '06  . skin graf     '98    Family History  Problem Relation Age of Onset  . Hypertension Son   . Asthma Mother   . Abdominal Wall Hernia Mother   . Stomach cancer Mother     Allergies  Allergen Reactions  . Penicillins Rash    Has patient had a PCN reaction causing immediate rash, facial/tongue/throat swelling, SOB or lightheadedness with hypotension: Yes Has patient had a  PCN reaction causing severe rash involving mucus membranes or skin necrosis: Unknown Has patient had a PCN reaction that required hospitalization: Unknown Has patient had a PCN reaction occurring within the last 10 years: No If all of the above answers are "NO", then may proceed with Cephalosporin use.     Current Outpatient Prescriptions on File Prior to Visit  Medication Sig Dispense Refill  . ALPRAZolam (XANAX) 0.25 MG tablet TAKE 1 TABLET BY MOUTH THREE TIMES DAILY AS NEEDED 90 tablet 0  . Calcium Carbonate-Vitamin D (CALTRATE 600+D) 600-400 MG-UNIT per chew tablet Chew 1 tablet by mouth every morning.     . cyanocobalamin (,VITAMIN B-12,) 1000 MCG/ML injection INJECT 1 ML IN  THE MUSCLE ONCE MONTHLY 10 mL 1  . escitalopram (LEXAPRO) 5 MG tablet TAKE 1 TABLET BY MOUTH DAILY 90 tablet 1  . Ipratropium-Albuterol (COMBIVENT) 20-100 MCG/ACT AERS respimat Inhale 1 puff into the lungs every 6 (six) hours as needed for wheezing or shortness of breath. 1 Inhaler 5  . mirtazapine (REMERON) 15 MG tablet TAKE 1 TABLET BY MOUTH EVERY NIGHT AT BEDTIME 90 tablet 0  . mometasone-formoterol (DULERA) 200-5 MCG/ACT AERO Inhale 2 puffs into the lungs 2 (two) times daily. 1 Inhaler 11  . Multiple Vitamins-Minerals (ICAPS AREDS 2) CAPS Take 1 capsule by mouth 2 (two) times daily.    . naproxen sodium (ALEVE) 220 MG tablet Take 220 mg by mouth as needed.    . OXYGEN Oxygen 3lpm 24/7    . Polyethyl Glycol-Propyl Glycol (SYSTANE OP) Place 1 drop into both eyes 4 (four) times daily as needed (for dry eyes).      No current facility-administered medications on file prior to visit.     BP 122/68 (BP Location: Left Arm, Patient Position: Sitting, Cuff Size: Normal)   Pulse 88   Temp 98.4 F (36.9 C) (Oral)   Ht 5\' 5"  (1.651 m)   Wt 98 lb 9.6 oz (44.7 kg)   SpO2 94%   BMI 16.41 kg/m    Review of Systems  Constitutional: Positive for activity change, appetite change, chills and fatigue. Negative for diaphoresis and fever.  HENT: Negative for congestion, dental problem, hearing loss, rhinorrhea, sinus pressure, sore throat and tinnitus.   Eyes: Negative for pain, discharge and visual disturbance.  Respiratory: Positive for cough and shortness of breath.   Cardiovascular: Negative for chest pain, palpitations and leg swelling.  Gastrointestinal: Positive for abdominal pain. Negative for abdominal distention, blood in stool, constipation, diarrhea, nausea and vomiting.  Genitourinary: Negative for difficulty urinating, dysuria, flank pain, frequency, hematuria, pelvic pain, urgency, vaginal bleeding, vaginal discharge and vaginal pain.  Musculoskeletal: Negative for arthralgias, gait  problem and joint swelling.  Skin: Negative for rash.  Neurological: Negative for dizziness, syncope, speech difficulty, weakness, numbness and headaches.  Hematological: Negative for adenopathy.  Psychiatric/Behavioral: Negative for agitation, behavioral problems and dysphoric mood. The patient is not nervous/anxious.        Objective:   Physical Exam  Constitutional: She is oriented to person, place, and time. She appears well-developed and well-nourished.  Alert Nasal cannula oxygen in place No distress but frequent paroxysms of congested, coughing O2 saturation 94% on supplemental O2 Pulse rate 88  HENT:  Head: Normocephalic.  Right Ear: External ear normal.  Left Ear: External ear normal.  Mouth/Throat: Oropharynx is clear and moist.  Eyes: Conjunctivae and EOM are normal. Pupils are equal, round, and reactive to light.  Neck: Normal range of motion. Neck supple. No thyromegaly  present.  Cardiovascular: Normal rate, regular rhythm, normal heart sounds and intact distal pulses.   Pulmonary/Chest: Effort normal. No respiratory distress. She has no wheezes.  Coarse rhonchi involving both lower lung fields  Abdominal: Soft. Bowel sounds are normal. She exhibits no mass. There is no tenderness.  Musculoskeletal: Normal range of motion.  Lymphadenopathy:    She has no cervical adenopathy.  Neurological: She is alert and oriented to person, place, and time.  Skin: Skin is warm and dry. No rash noted.  Psychiatric: She has a normal mood and affect. Her behavior is normal.          Assessment & Plan:   Chronic respiratory failure with hypoxia Bronchiectasis with acute exacerbation History of community acquired pneumonia COPD GOLD III   Chest x-ray discussed.  Doubtful.  Will make any change in therapy.  Will treat with Levaquin for 7 days.  Patient aware to report to ED/hospital for further evaluation and consideration for admission if any clinical  worsening   Rogelia Boga

## 2017-07-11 ENCOUNTER — Other Ambulatory Visit: Payer: Self-pay | Admitting: Internal Medicine

## 2017-07-11 NOTE — Telephone Encounter (Signed)
Son states pt still has chest congestion, and he still hears rattling or something.  Pt saw Dr Kirtland Bouchardk 2 wks ago and given  7 days levofloxacin (LEVAQUIN) 500 MG tablet  He would like to know what she should do? Pt surely feels better than she did.   [facility] [phone] - [place], [place] - [address] [facility]

## 2017-07-11 NOTE — Telephone Encounter (Signed)
Spoke with pt and she states that she still has productive cough with green mucus, some wheeze and is very much fatigued. She denies any fever. She did finish the Levaquin as directed. She would like to know what else she should do.   Dr. Kirtland BouchardK - Please advise. Thanks!

## 2017-07-13 MED ORDER — AZITHROMYCIN 250 MG PO TABS: ORAL_TABLET | ORAL | 0 refills | Status: DC

## 2017-07-13 NOTE — Telephone Encounter (Signed)
Spoke to all the Sharon [name] family (pt, daughter and son) and informed them of instructions.  While on the phone they mentioned the pt is waking in the morning with blood on her pillowcase, face and in her mouth.  Concerned it is coming from her lungs.  I spoke to Dr. Kirtland BouchardK and he said to take antibiotic over the weekend to see if there is improvement.  Will have triage call back on Monday.

## 2017-07-13 NOTE — Telephone Encounter (Signed)
Continue Combivent every 6 hours Mucinex one tablet twice daily  Azithromycin 250 mg #6.  2 daily for 3 consecutive days

## 2017-07-16 ENCOUNTER — Ambulatory Visit (INDEPENDENT_AMBULATORY_CARE_PROVIDER_SITE_OTHER)
Admission: RE | Admit: 2017-07-16 | Discharge: 2017-07-16 | Disposition: A | Discharge status: Home / Self Care | Payer: Federal, State, Local not specified - PPO | Source: Ambulatory Visit | Attending: Internal Medicine | Admitting: Internal Medicine

## 2017-07-16 ENCOUNTER — Encounter: Payer: Self-pay | Admitting: Internal Medicine

## 2017-07-16 ENCOUNTER — Ambulatory Visit (INDEPENDENT_AMBULATORY_CARE_PROVIDER_SITE_OTHER): Payer: Federal, State, Local not specified - PPO | Admitting: Internal Medicine

## 2017-07-16 VITALS — BP 158/70 | HR 81 | Temp 98.0°F | Ht 65.0 in | Wt 96.2 lb

## 2017-07-16 DIAGNOSIS — J449 Chronic obstructive pulmonary disease, unspecified: Secondary | ICD-10-CM | POA: Diagnosis not present

## 2017-07-16 DIAGNOSIS — J189 Pneumonia, unspecified organism: Secondary | ICD-10-CM

## 2017-07-16 DIAGNOSIS — J9611 Chronic respiratory failure with hypoxia: Secondary | ICD-10-CM | POA: Diagnosis not present

## 2017-07-16 DIAGNOSIS — J918 Pleural effusion in other conditions classified elsewhere: Secondary | ICD-10-CM

## 2017-07-16 NOTE — Progress Notes (Signed)
Subjective:    Patient ID: Sharon [name], female    DOB: [birth year], 83 y.o.   MRN: [id number]  HPI  83 year old patient who has a history of COPD Gold III and also a history of suspected bronchiectasis based on prior CT scanning.  She was seen earlier with increasing chest congestion and productive cough.  She was initially treated with 7 days of Levaquin and failed to improve.  Yesterday, she completed azithromycin. She is planning on extensive travel in 2 weeks and is concerned about her cough and congestion She remains on chronic home O2  Past Medical History:  Diagnosis Date  . Anemia   . B12 DEFICIENCY 03/31/2008  . COPD 11/27/2008  . ESOPHAGEAL STRICTURE 01/29/2009  . MASS, LUNG 10/13/2009  . NASAL FRACTURE 08/12/2009  . OSTEOARTHRITIS 03/31/2008   Hips  . OSTEOPOROSIS 12/27/2007  . PEDAL EDEMA 12/27/2007  . Pneumonia 07/10/2016  . PULMONARY NODULE 10/21/2009  . RESPIRATORY FAILURE, CHRONIC 06/29/2010  . Shortness of breath dyspnea      Social History   Social History  . Marital status: Widowed    Spouse name: N/A  . Number of children: N/A  . Years of education: N/A   Occupational History  . Not on file.   Social History Main Topics  . Smoking status: Former Smoker    Packs/day: 0.50    Years: 35.00    Quit date: 12/18/1978  . Smokeless tobacco: Never Used  . Alcohol use 0.0 oz/week     Comment: couple of gin and tonics daily  . Drug use: No  . Sexual activity: Not on file   Other Topics Concern  . Not on file   Social History Narrative  . No narrative on file    Past Surgical History:  Procedure Laterality Date  . APPENDECTOMY     '38  . BALLOON DILATION N/A 05/10/2015   Procedure: BALLOON DILATION;  Surgeon: Louis Meckelobert D Kaplan, MD;  [facility];  Service: Endoscopy;  Laterality: N/A;  . BALLOON DILATION N/A 08/02/2015   Procedure: BALLOON DILATION;  Surgeon: Louis Meckelobert D Kaplan, MD;  Location: [facility];  Service: Endoscopy;  Laterality: N/A;  .  BREAST SURGERY     biopsy '47  . CATARACT EXTRACTION Bilateral    last '05  . CHOLECYSTECTOMY     laparoscopic'10  . ESOPHAGOGASTRODUODENOSCOPY     with dilatation x3  . ESOPHAGOGASTRODUODENOSCOPY (EGD) WITH PROPOFOL N/A 05/10/2015   Procedure: ESOPHAGOGASTRODUODENOSCOPY (EGD) WITH PROPOFOL;  Surgeon: Louis Meckelobert D Kaplan, MD;  Location[facility];  Service: Endoscopy;  Laterality: N/A;  balloon dilation  . ESOPHAGOGASTRODUODENOSCOPY (EGD) WITH PROPOFOL N/A 08/02/2015   Procedure: ESOPHAGOGASTRODUODENOSCOPY (EGD) WITH PROPOFOL;  Surgeon: Louis Meckelobert D Kaplan, MD;  Location: [facility];  Service: Endoscopy;  Laterality: N/A;  . LUMBAR LAMINECTOMY    . NASAL RECONSTRUCTION WITH SEPTAL REPAIR     '10  . RECTAL PROLAPSE REPAIR     '06  . skin graf     '98    Family History  Problem Relation Age of Onset  . Hypertension Son   . Asthma Mother   . Abdominal Wall Hernia Mother   . Stomach cancer Mother     Allergies  Allergen Reactions  . Penicillins Rash    Has patient had a PCN reaction causing immediate rash, facial/tongue/throat swelling, SOB or lightheadedness with hypotension: Yes Has patient had a PCN reaction causing severe rash involving mucus membranes or skin necrosis: Unknown Has patient had a PCN reaction  that required hospitalization: Unknown Has patient had a PCN reaction occurring within the last 10 years: No If all of the above answers are "NO", then may proceed with Cephalosporin use.     Current Outpatient Prescriptions on File Prior to Visit  Medication Sig Dispense Refill  . ALPRAZolam (XANAX) 0.25 MG tablet TAKE 1 TABLET BY MOUTH THREE TIMES DAILY AS NEEDED FOR ANXIETY 90 tablet 0  . azithromycin (ZITHROMAX) 250 MG tablet Take 2 tablets daily for three consecutive days 6 tablet 0  . Calcium Carbonate-Vitamin D (CALTRATE 600+D) 600-400 MG-UNIT per chew tablet Chew 1 tablet by mouth every morning.     . cyanocobalamin (,VITAMIN B-12,) 1000 MCG/ML injection INJECT 1  ML IN THE MUSCLE ONCE MONTHLY 10 mL 1  . escitalopram (LEXAPRO) 5 MG tablet TAKE 1 TABLET BY MOUTH DAILY 90 tablet 1  . Ipratropium-Albuterol (COMBIVENT) 20-100 MCG/ACT AERS respimat Inhale 1 puff into the lungs every 6 (six) hours as needed for wheezing or shortness of breath. 1 Inhaler 5  . levofloxacin (LEVAQUIN) 500 MG tablet Take 1 tablet (500 mg total) by mouth daily. 7 tablet 0  . mirtazapine (REMERON) 15 MG tablet TAKE 1 TABLET BY MOUTH EVERY NIGHT AT BEDTIME 90 tablet 0  . mometasone-formoterol (DULERA) 200-5 MCG/ACT AERO Inhale 2 puffs into the lungs 2 (two) times daily. 1 Inhaler 11  . Multiple Vitamins-Minerals (ICAPS AREDS 2) CAPS Take 1 capsule by mouth 2 (two) times daily.    . naproxen sodium (ALEVE) 220 MG tablet Take 220 mg by mouth as needed.    . OXYGEN Oxygen 3lpm 24/7    . Polyethyl Glycol-Propyl Glycol (SYSTANE OP) Place 1 drop into both eyes 4 (four) times daily as needed (for dry eyes).      No current facility-administered medications on file prior to visit.     BP (!) 158/70 (BP Location: Left Arm, Patient Position: Sitting, Cuff Size: Normal)   Pulse 81   Temp 98 F (36.7 C) (Oral)   Ht 5\' 5"  (1.651 m)   Wt 96 lb 3.2 oz (43.6 kg)   SpO2 (!) 87%   BMI 16.01 kg/m     Review of Systems  Constitutional: Positive for activity change, appetite change and fatigue.  HENT: Negative for congestion, dental problem, hearing loss, rhinorrhea, sinus pressure, sore throat and tinnitus.   Eyes: Negative for pain, discharge and visual disturbance.  Respiratory: Positive for cough and shortness of breath.   Cardiovascular: Negative for chest pain, palpitations and leg swelling.  Gastrointestinal: Negative for abdominal distention, abdominal pain, blood in stool, constipation, diarrhea, nausea and vomiting.  Genitourinary: Negative for difficulty urinating, dysuria, flank pain, frequency, hematuria, pelvic pain, urgency, vaginal bleeding, vaginal discharge and vaginal pain.    Musculoskeletal: Negative for arthralgias, gait problem and joint swelling.  Skin: Negative for rash.  Neurological: Positive for weakness. Negative for dizziness, syncope, speech difficulty, numbness and headaches.  Hematological: Negative for adenopathy.  Psychiatric/Behavioral: Negative for agitation, behavioral problems and dysphoric mood. The patient is not nervous/anxious.        Objective:   Physical Exam  Constitutional: She is oriented to person, place, and time. She appears well-developed and well-nourished.  Elderly, frail Repeat blood pressure 132/70 Nasal cannula O2 in place  HENT:  Head: Normocephalic.  Right Ear: External ear normal.  Left Ear: External ear normal.  Mouth/Throat: Oropharynx is clear and moist.  Eyes: Pupils are equal, round, and reactive to light. Conjunctivae and EOM are normal.  Neck: Normal  range of motion. Neck supple. No thyromegaly present.  Cardiovascular: Normal rate, regular rhythm, normal heart sounds and intact distal pulses.   Pulmonary/Chest: Effort normal. No respiratory distress.  Coarse rhonchi involving both lower lung fields Frequent paroxysms of coughing  Abdominal: Soft. Bowel sounds are normal. She exhibits no mass. There is no tenderness.  Musculoskeletal: Normal range of motion.  Lymphadenopathy:    She has no cervical adenopathy.  Neurological: She is alert and oriented to person, place, and time.  Skin: Skin is warm and dry. No rash noted.  Psychiatric: She has a normal mood and affect. Her behavior is normal.          Assessment & Plan:   COPD/bronchiectasis.  Exacerbation.  Patient has just completed azithromycin History of parapneumonic effusion We'll review a chest x-ray Refer back to pulmonary medicine Chronic respiratory failure.  Continue home O2  Patient was given a prescription for a spacer device  Rogelia BogaKWIATKOWSKI,Demetris Capell FRANK

## 2017-07-16 NOTE — Patient Instructions (Addendum)
Take over-the-counter expectorants and cough medications such as  Mucinex DM.  Call if there is no improvement in 5 to 7 days or if  you develop worsening cough, fever, or new symptoms, such as shortness of breath or chest pain.  Hydrate and Humidify  Drink enough water to keep your urine clear or pale yellow. Staying hydrated will help to thin your mucus.  Use a cool mist humidifier to keep the humidity level in your home above 50%.  Inhale steam for 10-15 minutes, 3-4 times a day or as told by your health care provider. You can do this in the bathroom while a hot shower is running.  Limit your exposure to cool or dry air. Rest  Rest as much as possible. Sleep with your head raised (elevated).  Pulmonary follow-up as discussed

## 2017-07-16 NOTE — Telephone Encounter (Signed)
LMTCB

## 2017-07-17 NOTE — Telephone Encounter (Signed)
Sp[facility] with pt [facility]. Pt states that she still has productive cough and feels weak and tired. Advised pt to rest, stay hydrated and call office before end of week if still not improving. Pt voiced understanding. Nothing further needed at this time.

## 2017-07-18 ENCOUNTER — Ambulatory Visit (INDEPENDENT_AMBULATORY_CARE_PROVIDER_SITE_OTHER): Payer: Federal, State, Local not specified - PPO | Admitting: Adult Health

## 2017-07-18 ENCOUNTER — Other Ambulatory Visit (INDEPENDENT_AMBULATORY_CARE_PROVIDER_SITE_OTHER): Payer: Federal, State, Local not specified - PPO

## 2017-07-18 ENCOUNTER — Encounter: Payer: Self-pay | Admitting: Adult Health

## 2017-07-18 VITALS — BP 124/76 | HR 85 | Ht 65.0 in | Wt 95.9 lb

## 2017-07-18 DIAGNOSIS — J9611 Chronic respiratory failure with hypoxia: Secondary | ICD-10-CM | POA: Diagnosis not present

## 2017-07-18 DIAGNOSIS — J449 Chronic obstructive pulmonary disease, unspecified: Secondary | ICD-10-CM | POA: Diagnosis not present

## 2017-07-18 DIAGNOSIS — R0602 Shortness of breath: Secondary | ICD-10-CM | POA: Diagnosis not present

## 2017-07-18 LAB — CBC WITH DIFFERENTIAL/PLATELET
Basophils Absolute: 0.1 10*3/uL (ref 0.0–0.1)
Basophils Relative: 0.5 % (ref 0.0–3.0)
EOS ABS: 0.3 10*3/uL (ref 0.0–0.7)
EOS PCT: 3 % (ref 0.0–5.0)
HCT: 35.1 % — ABNORMAL LOW (ref 36.0–46.0)
Hemoglobin: 11.7 g/dL — ABNORMAL LOW (ref 12.0–15.0)
Lymphocytes Relative: 13.5 % (ref 12.0–46.0)
Lymphs Abs: 1.4 10*3/uL (ref 0.7–4.0)
MCHC: 33.4 g/dL (ref 30.0–36.0)
MCV: 93.5 fl (ref 78.0–100.0)
MONO ABS: 1.1 10*3/uL — AB (ref 0.1–1.0)
Monocytes Relative: 10.3 % (ref 3.0–12.0)
NEUTROS PCT: 72.7 % (ref 43.0–77.0)
Neutro Abs: 7.4 10*3/uL (ref 1.4–7.7)
PLATELETS: 496 10*3/uL — AB (ref 150.0–400.0)
RBC: 3.76 Mil/uL — AB (ref 3.87–5.11)
RDW: 13.7 % (ref 11.5–15.5)
WBC: 10.3 10*3/uL (ref 4.0–10.5)

## 2017-07-18 LAB — BASIC METABOLIC PANEL
BUN: 11 mg/dL (ref 6–23)
CALCIUM: 10.3 mg/dL (ref 8.4–10.5)
CO2: 32 meq/L (ref 19–32)
Chloride: 91 mEq/L — ABNORMAL LOW (ref 96–112)
Creatinine, Ser: 0.57 mg/dL (ref 0.40–1.20)
GFR: 105.38 mL/min (ref >60.00)
GLUCOSE: 105 mg/dL — AB (ref 70–99)
Potassium: 4.2 mEq/L (ref 3.5–5.1)
SODIUM: 130 meq/L — AB (ref 135–145)

## 2017-07-18 LAB — BRAIN NATRIURETIC PEPTIDE: Pro B Natriuretic peptide (BNP): 67 pg/mL (ref 0.0–100.0)

## 2017-07-18 MED ORDER — AZITHROMYCIN 500 MG PO TABS: 500.0000 mg | ORAL_TABLET | Freq: Every day | ORAL | 0 refills | Status: DC

## 2017-07-18 NOTE — Patient Instructions (Signed)
Zithromax 500 mg daily for 1 week. (Probiotic daily for 1 month. Mucinex DM twice daily as needed for cough and congestion. Lab work today Follow-up with one week with Dr. Sherene SiresWert with chest x-ray Please contact office for sooner follow up if symptoms do not improve or worsen or seek emergency care

## 2017-07-18 NOTE — Assessment & Plan Note (Signed)
Cont on O2 .  

## 2017-07-18 NOTE — Assessment & Plan Note (Signed)
Flare with possible PNA /Bronchitis  Check labs with bnp and d dimer  Add abx x 1 wk   Plan  Patient Instructions  Zithromax 500 mg daily for 1 week. (Probiotic daily for 1 month. Mucinex DM twice daily as needed for cough and congestion. Lab work today Follow-up with one week with Dr. Sherene SiresWert with chest x-ray Please contact office for sooner follow up if symptoms do not improve or worsen or seek emergency care

## 2017-07-18 NOTE — Progress Notes (Signed)
Chart and office note reviewed in detail  > agree with a/p as outlined    

## 2017-07-18 NOTE — Progress Notes (Signed)
 @Patient  ID: Sharon AngerJoanne E Soderholm, female    DOB: Feb 14, 1923, 83 y.o.   MRN: [id number]  Chief Complaint  Patient presents with  . Acute Visit    Cough     Referring provider: Gordy SaversKwiatkowski, Peter F, MD  HPI: 83 yo female former smoker followed for GOLD III COPD , Bronchiectasis , Mild chronic nodular infiltrates dating back to 2009.  CAP 07/2016 w/ parapneumonic effusion s/p tap  07/18/2017 Acute OV : Cough  Complains of 4 week of cough, congestion and sob and wheezing .  This occurred after vacation ([place]) . Seen by PCP on 7/9 tx w/ Levaquin .  She had some improvement with persistent symptoms with cough , congestion and thick green mucus . Tx w/ Zithromax 500gm on 7/27 for 3 days . CXR on 7/30 Showed new small left effusion   she denies hemoptysis but did wake up with blood in her mouth last week. Denies calf pain  Has had several long car drive lately.  Has a very rattling cough with thick green mucus .  She denies chest pain, orthopnea, edema , fever or n/v/d.    Allergies  Allergen Reactions  . Penicillins Rash    Has patient had a PCN reaction causing immediate rash, facial/tongue/throat swelling, SOB or lightheadedness with hypotension: Yes Has patient had a PCN reaction causing severe rash involving mucus membranes or skin necrosis: Unknown Has patient had a PCN reaction that required hospitalization: Unknown Has patient had a PCN reaction occurring within the last 10 years: No If all of the above answers are "NO", then may proceed with Cephalosporin use.     Immunization History  Administered Date(s) Administered  . Influenza Split 09/26/2011, 09/27/2012  . Influenza Whole 09/30/2009  . Influenza, High Dose Seasonal PF 09/24/2013, 01/10/2016, 09/21/2016  . Influenza-Unspecified 10/13/2014  . Pneumococcal Conjugate-13 02/11/2014  . Pneumococcal Polysaccharide-23 05/05/2009  . Td 05/05/2009    Past Medical History:  Diagnosis Date  . Anemia   . B12  DEFICIENCY 03/31/2008  . COPD 11/27/2008  . ESOPHAGEAL STRICTURE 01/29/2009  . MASS, LUNG 10/13/2009  . NASAL FRACTURE 08/12/2009  . OSTEOARTHRITIS 03/31/2008   Hips  . OSTEOPOROSIS 12/27/2007  . PEDAL EDEMA 12/27/2007  . Pneumonia 07/10/2016  . PULMONARY NODULE 10/21/2009  . RESPIRATORY FAILURE, CHRONIC 06/29/2010  . Shortness of breath dyspnea     Tobacco History: History  Smoking Status  . Former Smoker  . Packs/day: 0.50  . Years: 35.00  . Quit date: 12/18/1978  Smokeless Tobacco  . Never Used   Counseling given: Not Answered   Outpatient Encounter Prescriptions as of 07/18/2017  Medication Sig  . ALPRAZolam (XANAX) 0.25 MG tablet TAKE 1 TABLET BY MOUTH THREE TIMES DAILY AS NEEDED FOR ANXIETY  . azithromycin (ZITHROMAX) 250 MG tablet Take 2 tablets daily for three consecutive days  . azithromycin (ZITHROMAX) 500 MG tablet Take 1 tablet (500 mg total) by mouth daily.  . Calcium Carbonate-Vitamin D (CALTRATE 600+D) 600-400 MG-UNIT per chew tablet Chew 1 tablet by mouth every morning.   . cyanocobalamin (,VITAMIN B-12,) 1000 MCG/ML injection INJECT 1 ML IN THE MUSCLE ONCE MONTHLY  . escitalopram (LEXAPRO) 5 MG tablet TAKE 1 TABLET BY MOUTH DAILY  . Ipratropium-Albuterol (COMBIVENT) 20-100 MCG/ACT AERS respimat Inhale 1 puff into the lungs every 6 (six) hours as needed for wheezing or shortness of breath.  . mirtazapine (REMERON) 15 MG tablet TAKE 1 TABLET BY MOUTH EVERY NIGHT AT BEDTIME  . mometasone-formoterol (DULERA) 200-5 MCG/ACT AERO  Inhale 2 puffs into the lungs 2 (two) times daily.  . Multiple Vitamins-Minerals (ICAPS AREDS 2) CAPS Take 1 capsule by mouth 2 (two) times daily.  . naproxen sodium (ALEVE) 220 MG tablet Take 220 mg by mouth as needed.  . OXYGEN Oxygen 3lpm 24/7  . Polyethyl Glycol-Propyl Glycol (SYSTANE OP) Place 1 drop into both eyes 4 (four) times daily as needed (for dry eyes).   . [DISCONTINUED] levofloxacin (LEVAQUIN) 500 MG tablet Take 1 tablet (500 mg total)  by mouth daily. (Patient not taking: Reported on 07/18/2017)   No facility-administered encounter medications on file as of 07/18/2017.      Review of Systems  Constitutional:   No  weight loss, night sweats,  Fevers, chills, fatigue, or  lassitude.  HEENT:   No headaches,  Difficulty swallowing,  Tooth/dental problems, or  Sore throat,                No sneezing, itching, ear ache, nasal congestion, post nasal drip,   CV:  No chest pain,  Orthopnea, PND, swelling in lower extremities, anasarca, dizziness, palpitations, syncope.   GI  No heartburn, indigestion, abdominal pain, nausea, vomiting, diarrhea, change in bowel habits, loss of appetite, bloody stools.   Resp: No shortness of breath with exertion or at rest.  No excess mucus, no productive cough,  No non-productive cough,  No coughing up of blood.  No change in color of mucus.  No wheezing.  No chest wall deformity  Skin: no rash or lesions.  GU: no dysuria, change in color of urine, no urgency or frequency.  No flank pain, no hematuria   MS:  No joint pain or swelling.  No decreased range of motion.  No back pain.    Physical Exam  BP 124/76 (BP Location: Right Arm, Cuff Size: Small)   Pulse 85   Ht 5\' 5"  (1.651 m)   Wt 95 lb 14.4 oz (43.5 kg)   SpO2 92%   BMI 15.96 kg/m   GEN: A/Ox3; pleasant , NAD, thin , elderly on o2    HEENT:  [place]/AT,  EACs-clear, TMs-wnl, NOSE-clear, THROAT-clear, no lesions, no postnasal drip or exudate noted.   NECK:  Supple w/ fair ROM; no JVD; normal carotid impulses w/o bruits; no thyromegaly or nodules palpated; no lymphadenopathy.    RESP  Scattered rhonchi . no accessory muscle use, no dullness to percussion  CARD:  RRR, no m/r/g, no peripheral edema, pulses intact, no cyanosis or clubbing.  GI:   Soft & nt; nml bowel sounds; no organomegaly or masses detected.   Musco: Warm bil, no deformities or joint swelling noted.   Neuro: alert, no focal deficits noted.    Skin: Warm, no  lesions or rashes    Lab Results:  CBC  BNP No results found for: BNP  ProBNP No results found for: PROBNP  Imaging: Dg Chest 2 View  Result Date: 07/16/2017 CLINICAL DATA:  83 year old female with increase shortness of breath for 3 weeks. Cough and congestion. EXAM: CHEST  2 VIEW COMPARISON:  10/13/2016 and earlier. FINDINGS: Chronic large lung volumes with coarse interstitial pulmonary opacity. Emphysema and bronchiectasis demonstrated by chest CT on 10/13/2009. Small layering left pleural effusion is new or increased since 2017. Right lung base appears stable. No other acute pulmonary opacity identified. Stable cardiac size and mediastinal contours. Calcified aortic atherosclerosis. Osteopenia. Lumbar fusion hardware is partially visible. No acute osseous abnormality identified. Negative visible bowel gas pattern. IMPRESSION: 1. Small left pleural  effusion is new or increased since 2017. 2. Underlying chronic lung disease with emphysema and Bronchiectasis. 3. Aortic Atherosclerosis (ICD10-I70.0) and Emphysema (ICD10-J43.9). Electronically Signed   By: Odessa Fleming M.D.   On: 07/16/2017 15:22     Assessment & Plan:   COPD GOLD III  Flare with possible PNA /Bronchitis  Check labs with bnp and d dimer  Add abx x 1 wk   Plan  Patient Instructions  Zithromax 500 mg daily for 1 week. (Probiotic daily for 1 month. Mucinex DM twice daily as needed for cough and congestion. Lab work today Follow-up with one week with Dr. Sherene Sires with chest x-ray Please contact office for sooner follow up if symptoms do not improve or worsen or seek emergency care      Chronic respiratory failure with hypoxia (HCC) Cont on O2 .      Rubye Oaks, NP 07/18/2017

## 2017-07-19 ENCOUNTER — Inpatient Hospital Stay (HOSPITAL_COMMUNITY)
Admission: EM | Admit: 2017-07-19 | Discharge: 2017-07-22 | DRG: 193 | Disposition: A | Discharge status: Home / Self Care | Payer: Medicare Other | Attending: Internal Medicine | Admitting: Internal Medicine

## 2017-07-19 ENCOUNTER — Emergency Department (HOSPITAL_COMMUNITY): Payer: Medicare Other

## 2017-07-19 ENCOUNTER — Encounter (HOSPITAL_COMMUNITY): Payer: Self-pay

## 2017-07-19 ENCOUNTER — Telehealth: Payer: Self-pay | Admitting: Adult Health

## 2017-07-19 ENCOUNTER — Inpatient Hospital Stay: Admission: RE | Admit: 2017-07-19 | Payer: Medicare Other | Source: Ambulatory Visit

## 2017-07-19 DIAGNOSIS — J189 Pneumonia, unspecified organism: Secondary | ICD-10-CM | POA: Diagnosis present

## 2017-07-19 DIAGNOSIS — J9811 Atelectasis: Secondary | ICD-10-CM | POA: Diagnosis present

## 2017-07-19 DIAGNOSIS — I2510 Atherosclerotic heart disease of native coronary artery without angina pectoris: Secondary | ICD-10-CM | POA: Diagnosis not present

## 2017-07-19 DIAGNOSIS — F419 Anxiety disorder, unspecified: Secondary | ICD-10-CM | POA: Diagnosis present

## 2017-07-19 DIAGNOSIS — J181 Lobar pneumonia, unspecified organism: Secondary | ICD-10-CM | POA: Diagnosis not present

## 2017-07-19 DIAGNOSIS — R0602 Shortness of breath: Secondary | ICD-10-CM

## 2017-07-19 DIAGNOSIS — R05 Cough: Secondary | ICD-10-CM

## 2017-07-19 DIAGNOSIS — J9611 Chronic respiratory failure with hypoxia: Secondary | ICD-10-CM | POA: Diagnosis not present

## 2017-07-19 DIAGNOSIS — Z9981 Dependence on supplemental oxygen: Secondary | ICD-10-CM

## 2017-07-19 DIAGNOSIS — R1310 Dysphagia, unspecified: Secondary | ICD-10-CM | POA: Diagnosis present

## 2017-07-19 DIAGNOSIS — F329 Major depressive disorder, single episode, unspecified: Secondary | ICD-10-CM | POA: Diagnosis present

## 2017-07-19 DIAGNOSIS — R059 Cough, unspecified: Secondary | ICD-10-CM | POA: Diagnosis present

## 2017-07-19 DIAGNOSIS — J430 Unilateral pulmonary emphysema [MacLeod's syndrome]: Secondary | ICD-10-CM

## 2017-07-19 DIAGNOSIS — J449 Chronic obstructive pulmonary disease, unspecified: Secondary | ICD-10-CM

## 2017-07-19 DIAGNOSIS — Z79899 Other long term (current) drug therapy: Secondary | ICD-10-CM

## 2017-07-19 DIAGNOSIS — Z880 Allergy status to penicillin: Secondary | ICD-10-CM

## 2017-07-19 DIAGNOSIS — R7989 Other specified abnormal findings of blood chemistry: Secondary | ICD-10-CM

## 2017-07-19 DIAGNOSIS — M810 Age-related osteoporosis without current pathological fracture: Secondary | ICD-10-CM | POA: Diagnosis present

## 2017-07-19 DIAGNOSIS — G4700 Insomnia, unspecified: Secondary | ICD-10-CM | POA: Diagnosis present

## 2017-07-19 DIAGNOSIS — M1990 Unspecified osteoarthritis, unspecified site: Secondary | ICD-10-CM | POA: Diagnosis present

## 2017-07-19 DIAGNOSIS — J9620 Acute and chronic respiratory failure, unspecified whether with hypoxia or hypercapnia: Secondary | ICD-10-CM | POA: Diagnosis present

## 2017-07-19 DIAGNOSIS — J9600 Acute respiratory failure, unspecified whether with hypoxia or hypercapnia: Secondary | ICD-10-CM | POA: Diagnosis present

## 2017-07-19 DIAGNOSIS — D649 Anemia, unspecified: Secondary | ICD-10-CM | POA: Diagnosis present

## 2017-07-19 DIAGNOSIS — J90 Pleural effusion, not elsewhere classified: Secondary | ICD-10-CM | POA: Diagnosis present

## 2017-07-19 DIAGNOSIS — F32A Depression, unspecified: Secondary | ICD-10-CM | POA: Diagnosis present

## 2017-07-19 DIAGNOSIS — J440 Chronic obstructive pulmonary disease with acute lower respiratory infection: Secondary | ICD-10-CM | POA: Diagnosis present

## 2017-07-19 LAB — BASIC METABOLIC PANEL
ANION GAP: 10 (ref 5–15)
BUN: 9 mg/dL (ref 6–20)
CALCIUM: 9.6 mg/dL (ref 8.9–10.3)
CO2: 27 mmol/L (ref 22–32)
Chloride: 95 mmol/L — ABNORMAL LOW (ref 101–111)
Creatinine, Ser: 0.54 mg/dL (ref 0.44–1.00)
GLUCOSE: 101 mg/dL — AB (ref 65–99)
POTASSIUM: 4.1 mmol/L (ref 3.5–5.1)
SODIUM: 132 mmol/L — AB (ref 135–145)

## 2017-07-19 LAB — I-STAT TROPONIN, ED: TROPONIN I, POC: 0.01 ng/mL (ref 0.00–0.08)

## 2017-07-19 LAB — CBC
HEMATOCRIT: 33.2 % — AB (ref 36.0–46.0)
HEMOGLOBIN: 10.8 g/dL — AB (ref 12.0–15.0)
MCH: 30 pg (ref 26.0–34.0)
MCHC: 32.5 g/dL (ref 30.0–36.0)
MCV: 92.2 fL (ref 78.0–100.0)
Platelets: 462 10*3/uL — ABNORMAL HIGH (ref 150–400)
RBC: 3.6 MIL/uL — AB (ref 3.87–5.11)
RDW: 13.9 % (ref 11.5–15.5)
WBC: 9.6 10*3/uL (ref 4.0–10.5)

## 2017-07-19 LAB — D-DIMER, QUANTITATIVE: D-Dimer, Quant: 1.88 mcg/mL FEU — ABNORMAL HIGH (ref <0.50)

## 2017-07-19 MED ORDER — ALBUTEROL SULFATE (2.5 MG/3ML) 0.083% IN NEBU
2.5000 mg | INHALATION_SOLUTION | RESPIRATORY_TRACT | Status: DC | PRN
Start: 2017-07-19 — End: 2017-07-20

## 2017-07-19 MED ORDER — GUAIFENESIN ER 600 MG PO TB12
600.0000 mg | ORAL_TABLET | Freq: Two times a day (BID) | ORAL | Status: DC
  Administered 2017-07-19: 600 mg via ORAL
  Filled 2017-07-19 (×2): qty 1

## 2017-07-19 MED ORDER — LEVOFLOXACIN IN D5W 750 MG/150ML IV SOLN
750.0000 mg | INTRAVENOUS | Status: DC
  Administered 2017-07-21: 750 mg via INTRAVENOUS
  Filled 2017-07-19 (×2): qty 150

## 2017-07-19 MED ORDER — ESCITALOPRAM OXALATE 10 MG PO TABS
5.0000 mg | ORAL_TABLET | Freq: Every day | ORAL | Status: DC
  Administered 2017-07-19 – 2017-07-22 (×4): 5 mg via ORAL
  Filled 2017-07-19 (×4): qty 1

## 2017-07-19 MED ORDER — ENOXAPARIN SODIUM 40 MG/0.4ML ~~LOC~~ SOLN: 40.0000 mg | SUBCUTANEOUS | Status: DC

## 2017-07-19 MED ORDER — LEVOFLOXACIN IN D5W 750 MG/150ML IV SOLN: 750.0000 mg | INTRAVENOUS | Status: DC

## 2017-07-19 MED ORDER — PROSIGHT PO TABS
1.0000 | ORAL_TABLET | Freq: Every day | ORAL | Status: DC
  Filled 2017-07-19 (×3): qty 1

## 2017-07-19 MED ORDER — IOPAMIDOL (ISOVUE-370) INJECTION 76%
INTRAVENOUS | Status: AC
  Administered 2017-07-19: 100 mL
  Filled 2017-07-19: qty 100

## 2017-07-19 MED ORDER — MIRTAZAPINE 15 MG PO TABS
15.0000 mg | ORAL_TABLET | Freq: Every day | ORAL | Status: DC
  Administered 2017-07-19 – 2017-07-21 (×3): 15 mg via ORAL
  Filled 2017-07-19 (×3): qty 1

## 2017-07-19 MED ORDER — IPRATROPIUM-ALBUTEROL 0.5-2.5 (3) MG/3ML IN SOLN
3.0000 mL | Freq: Three times a day (TID) | RESPIRATORY_TRACT | Status: DC
  Administered 2017-07-20: 3 mL via RESPIRATORY_TRACT
  Filled 2017-07-19: qty 3

## 2017-07-19 MED ORDER — CALCIUM CARBONATE-VITAMIN D 500-200 MG-UNIT PO TABS
1.0000 | ORAL_TABLET | Freq: Every day | ORAL | Status: DC
  Administered 2017-07-20 – 2017-07-22 (×3): 1 via ORAL
  Filled 2017-07-19 (×3): qty 1

## 2017-07-19 MED ORDER — ARTIFICIAL TEARS OPHTHALMIC OINT
TOPICAL_OINTMENT | Freq: Every day | OPHTHALMIC | Status: DC
  Administered 2017-07-20: 14:00:00 via OPHTHALMIC
  Filled 2017-07-19 (×2): qty 3.5

## 2017-07-19 MED ORDER — POLYETHYL GLYCOL-PROPYL GLYCOL 0.4-0.3 % OP GEL
Freq: Every day | OPHTHALMIC | Status: DC
  Filled 2017-07-19: qty 10

## 2017-07-19 MED ORDER — POTASSIUM CHLORIDE IN NACL 20-0.9 MEQ/L-% IV SOLN
INTRAVENOUS | Status: DC
  Administered 2017-07-19: 22:00:00 via INTRAVENOUS
  Filled 2017-07-19: qty 1000

## 2017-07-19 MED ORDER — LEVOFLOXACIN IN D5W 500 MG/100ML IV SOLN: 500.0000 mg | INTRAVENOUS | Status: DC

## 2017-07-19 MED ORDER — IPRATROPIUM-ALBUTEROL 0.5-2.5 (3) MG/3ML IN SOLN: 3.0000 mL | Freq: Four times a day (QID) | RESPIRATORY_TRACT | Status: DC | PRN

## 2017-07-19 MED ORDER — ALPRAZOLAM 0.25 MG PO TABS
0.2500 mg | ORAL_TABLET | Freq: Three times a day (TID) | ORAL | Status: DC | PRN
  Administered 2017-07-19: 0.25 mg via ORAL
  Filled 2017-07-19: qty 1

## 2017-07-19 MED ORDER — IPRATROPIUM-ALBUTEROL 0.5-2.5 (3) MG/3ML IN SOLN: 3.0000 mL | Freq: Four times a day (QID) | RESPIRATORY_TRACT | Status: DC

## 2017-07-19 MED ORDER — MOMETASONE FURO-FORMOTEROL FUM 200-5 MCG/ACT IN AERO
2.0000 | INHALATION_SPRAY | Freq: Two times a day (BID) | RESPIRATORY_TRACT | Status: DC
  Administered 2017-07-19 – 2017-07-22 (×6): 2 via RESPIRATORY_TRACT
  Filled 2017-07-19: qty 8.8

## 2017-07-19 MED ORDER — ICAPS AREDS 2 PO CAPS: 1.0000 | ORAL_CAPSULE | Freq: Every day | ORAL | Status: DC

## 2017-07-19 MED ORDER — ENOXAPARIN SODIUM 30 MG/0.3ML ~~LOC~~ SOLN
30.0000 mg | SUBCUTANEOUS | Status: DC
  Administered 2017-07-20 – 2017-07-21 (×2): 30 mg via SUBCUTANEOUS
  Filled 2017-07-19 (×2): qty 0.3

## 2017-07-19 MED ORDER — CALCIUM CARBONATE-VITAMIN D 600-400 MG-UNIT PO CHEW: 1.0000 | CHEWABLE_TABLET | Freq: Every morning | ORAL | Status: DC

## 2017-07-19 MED ORDER — LEVOFLOXACIN IN D5W 750 MG/150ML IV SOLN
750.0000 mg | Freq: Once | INTRAVENOUS | Status: AC
  Administered 2017-07-19: 750 mg via INTRAVENOUS
  Filled 2017-07-19: qty 150

## 2017-07-19 NOTE — ED Triage Notes (Signed)
Per Pt, Pt is coming from home with complaint of mid-center lower chest pain where a pill is stuck in her throat. Pt reports having a increase of SOB where she has been being seen by her MD. Pt is on chronic oxygen 2L at home with good saturation upon arrival. Reports MD reporting possible blood clot due to elevated D-Dimer.

## 2017-07-19 NOTE — Telephone Encounter (Signed)
Spoke with pt's daughter, Nicholos JohnsKathleen. States that pt is having chest pain and increased SOB. I have relayed the pt's blood work to Stratford[place]leen which revealed an elevated D-Dimer. I have advised Nicholos JohnsKathleen to take the pt to the nearest ER to be evaluated. Order for CT angio has been placed. Nothing further was needed.

## 2017-07-19 NOTE — H&P (Signed)
[facility] History and Physical  Sharon [name] GNF:[id number] DOB: 1923/08/29 DOA: 07/19/2017  Referring physician: A. Tiburcio PeaHarris, ED PA  PCP: Gordy SaversKwiatkowski, Peter F, MD   Chief Complaint: Cough and SOB  HPI: Sharon [name] is a 83 y.o. female with hx of COPD on home O2 at 3 lpm, chron resp failure, OA, COPD, esophageal stricture w/ chron dysphagia, anemia presented to ED wthi 3 weeks hx of prod cough w greenish sputum, malaise and ^DOE.  Has had two courses of po abx, first levaquin then azithromycin and didn't get better w/ either.  No sig CP, no fevers/ chills, no abd pain , no n/v/d.   CXR shows old pleural effusions L > R vs scarring at bilat bases.  COPD.  CT angio showed no PE and marked COPD changes.   [place]/ [place] and daughter.  Likes to travel a lot.  No tob/ etoh.   ROS  denies CP  no joint pain   no HA  no blurry vision  no rash  no diarrhea  no nausea/ vomiting  no dysuria  no difficulty voiding  no change in urine color    Past Medical History  Past Medical History:  Diagnosis Date  . Anemia   . B12 DEFICIENCY 03/31/2008  . COPD 11/27/2008  . ESOPHAGEAL STRICTURE 01/29/2009  . MASS, LUNG 10/13/2009  . NASAL FRACTURE 08/12/2009  . OSTEOARTHRITIS 03/31/2008   Hips  . OSTEOPOROSIS 12/27/2007  . PEDAL EDEMA 12/27/2007  . Pneumonia 07/10/2016  . PULMONARY NODULE 10/21/2009  . RESPIRATORY FAILURE, CHRONIC 06/29/2010  . Shortness of breath dyspnea    Past Surgical History  Past Surgical History:  Procedure Laterality Date  . APPENDECTOMY     '38  . BALLOON DILATION N/A 05/10/2015   Procedure: BALLOON DILATION;  Surgeon: Louis Meckelobert D Kaplan, MD;  [facility];  Service: Endoscopy;  Laterality: N/A;  . BALLOON DILATION N/A 08/02/2015   Procedure: BALLOON DILATION;  Surgeon: Louis Meckelobert D Kaplan, MD;  Location: [facility];  Service: Endoscopy;  Laterality: N/A;  . BREAST SURGERY     biopsy '47  . CATARACT EXTRACTION Bilateral    last '05  .  CHOLECYSTECTOMY     laparoscopic'10  . ESOPHAGOGASTRODUODENOSCOPY     with dilatation x3  . ESOPHAGOGASTRODUODENOSCOPY (EGD) WITH PROPOFOL N/A 05/10/2015   Procedure: ESOPHAGOGASTRODUODENOSCOPY (EGD) WITH PROPOFOL;  Surgeon: Louis Meckelobert D Kaplan, MD;  Location: [facility];  Service: Endoscopy;  Laterality: N/A;  balloon dilation  . ESOPHAGOGASTRODUODENOSCOPY (EGD) WITH PROPOFOL N/A 08/02/2015   Procedure: ESOPHAGOGASTRODUODENOSCOPY (EGD) WITH PROPOFOL;  Surgeon: Louis Meckelobert D Kaplan, MD;  Location: [facility];  Service: Endoscopy;  Laterality: N/A;  . LUMBAR LAMINECTOMY    . NASAL RECONSTRUCTION WITH SEPTAL REPAIR     '10  . RECTAL PROLAPSE REPAIR     '06  . skin graf     '98   Family History  Family History  Problem Relation Age of Onset  . Hypertension Son   . Asthma Mother   . Abdominal Wall Hernia Mother   . Stomach cancer Mother    Social History  reports that she quit smoking about 38 years ago. She has a 17.50 pack-year smoking history. She has never used smokeless tobacco. She reports that she drinks alcohol. She reports that she does not use drugs. Allergies  Allergies  Allergen Reactions  . Penicillins Rash    Has patient had a PCN reaction causing immediate rash, facial/tongue/throat swelling, SOB or lightheadedness  with hypotension: Yes Has patient had a PCN reaction causing severe rash involving mucus membranes or skin necrosis: Unknown Has patient had a PCN reaction that required hospitalization: Unknown Has patient had a PCN reaction occurring within the last 10 years: No If all of the above answers are "NO", then may proceed with Cephalosporin use.    Home medications Prior to Admission medications   Medication Sig Start Date End Date Taking? Authorizing Provider  ALPRAZolam (XANAX) 0.25 MG tablet TAKE 1 TABLET BY MOUTH THREE TIMES DAILY AS NEEDED FOR ANXIETY Patient taking differently: TAKE 0.25MG  BY MOUTH THREE TIMES DAILY AS NEEDED FOR ANXIETY 07/11/17  Yes  Gordy SaversKwiatkowski, Peter F, MD  azithromycin (ZITHROMAX) 500 MG tablet Take 1 tablet (500 mg total) by mouth daily. 07/18/17  Yes Parrett, Virgel Bouquetammy S, NP  Calcium Carbonate-Vitamin D (CALTRATE 600+D) 600-400 MG-UNIT per chew tablet Chew 1 tablet by mouth every morning.    Yes [provider]  cyanocobalamin (,VITAMIN B-12,) 1000 MCG/ML injection INJECT 1 ML IN THE MUSCLE ONCE MONTHLY 10/23/16  Yes Gordy SaversKwiatkowski, Peter F, MD  escitalopram (LEXAPRO) 5 MG tablet TAKE 1 TABLET BY MOUTH DAILY Patient taking differently: TAKE 5MG  BY MOUTH DAILY 05/16/17  Yes Gordy SaversKwiatkowski, Peter F, MD  guaiFENesin (MUCINEX) 600 MG 12 hr tablet Take 600 mg by mouth 2 (two) times daily.   Yes [provider]  Ipratropium-Albuterol (COMBIVENT) 20-100 MCG/ACT AERS respimat Inhale 1 puff into the lungs every 6 (six) hours as needed for wheezing or shortness of breath. 05/15/17  Yes Gordy SaversKwiatkowski, Peter F, MD  mirtazapine (REMERON) 15 MG tablet TAKE 1 TABLET BY MOUTH EVERY NIGHT AT BEDTIME Patient taking differently: TAKE 15MG  BY MOUTH EVERY NIGHT AT BEDTIME 06/05/17  Yes Gordy SaversKwiatkowski, Peter F, MD  mometasone-formoterol [facility](DULERA) 200-5 MCG/ACT AERO Inhale 2 puffs into the lungs 2 (two) times daily. 10/20/16  Yes Nyoka CowdenWert, Michael B, MD  Multiple Vitamins-Minerals (ICAPS AREDS 2) CAPS Take 1 capsule by mouth daily.    Yes [provider]  naproxen sodium (ALEVE) 220 MG tablet Take 220 mg by mouth as needed (pain).    Yes [provider]  OXYGEN Oxygen 3lpm 24/7   Yes [provider]  Polyethyl Glycol-Propyl Glycol (SYSTANE OP) Place 1 drop into both eyes daily.    Yes [provider]  Probiotic Product (PROBIOTIC PO) Take 1 tablet by mouth daily.   Yes [provider]   Liver Function Tests No results for input(s): AST, ALT, ALKPHOS, BILITOT, PROT, ALBUMIN in the last 168 hours. No results for input(s): LIPASE, AMYLASE in the last 168 hours. CBC  Recent Labs Lab 07/18/17 1241  07/19/17 1132  WBC 10.3 9.6  NEUTROABS 7.4  --   HGB 11.7* 10.8*  HCT 35.1* 33.2*  MCV 93.5 92.2  PLT 496.0* 462*   Basic Metabolic Panel  Recent Labs Lab 07/18/17 1241 07/19/17 1132  NA 130* 132*  K 4.2 4.1  CL 91* 95*  CO2 32 27  GLUCOSE 105* 101*  BUN 11 9  CREATININE 0.57 0.54  CALCIUM 10.3 9.6     Vitals:   07/19/17 1703 07/19/17 1715 07/19/17 1730 07/19/17 1745  BP: 118/67 (!) 121/59 137/62 134/69  Pulse: 72 71 73 72  Resp: 19 (!) 26 (!) 33 (!) 24  Temp:      TempSrc:      SpO2: 98% 99% 96% 96%  Weight:      Height:       Exam: Gen pleasant elderly WF, coughign  off and on, not in distress, nasal O2 No rash, cyanosis or gangrene Sclera anicteric, throat clear  No jvd or bruits Chest dec'd BS throughout, no sig wheezing or bronchial BS RRR no MRG Abd soft ntnd no mass or ascites +bs GU defer MS no joint effusions or deformity Ext no LE or UE edema / no wounds or ulcers Neuro is alert, Ox 3 , nf   Home meds > xanax, lexapro, combivent, remeron, Dulera, MVI, aleve, O2 3 L  WBC 9k  Hb 10.8 Cr 0.54  Glu 101  EKG (independ reviewed) > NSR early repol, 78 bpm    Assessment: 1. Comm-acquired PNA - failed po abx x 2 courses.  PCN allergy.  Plan admit, IV levaquin, O2 and gentle IVF's.  Sig underlying COPD.  2. COPD advanced 3. Anemia - mild , Hb 10.8 4. Chron resp failure - on home O2  Plan - as above      Kazimir Hartnett D [facility] Pager [phone]   If 7PM-7AM, please contact night-coverage [email or web address] Password [facility] 07/19/2017, 7:55 PM

## 2017-07-19 NOTE — Progress Notes (Signed)
LMOMTCB x 1 

## 2017-07-19 NOTE — ED Provider Notes (Signed)
[facility]-EMERGENCY DEPT Provider Note   CSN: [id number] Arrival date & time: 07/19/17  1116     History   Chief Complaint Chief Complaint  Patient presents with  . Shortness of Breath    HPI Sharon [name] is a 83 y.o. female 83 year old female who presents the emergency department with complaint of cough and pill stuck in her throat. States that her chief complaint is that she swallowed half of a 500 mg azithromycin today and got it stuck in her throat. She had gagging and coughing. The patient has a history of a esophageal stricture that requires occasional dilation. She has not had positive for some time. She states that her pain is currently greatly improved and she feels that the pill has moved down toward her stomach. Her daughter was with her dizziness is secondary history. She's had a persistent cough. She has a diagnosis of COPD and is on 2 L via nasal cannula. Patient states that she has had a productive cough for the past month. Even been unable to identify the cause. She is on a second course of azithromycin. She seen by NP Tammy Parrett ad lib. our pulmonology and had an elevated d-dimer. She was told to come to the emergency department for evaluation. I did review the lab values and her elevated d-dimer cannot be age adjusted. She denies hypoxia or changes in her oxygen demand. She denies a history of blood clots, hemoptysis.  HPI  Past Medical History:  Diagnosis Date  . Anemia   . B12 DEFICIENCY 03/31/2008  . COPD 11/27/2008  . ESOPHAGEAL STRICTURE 01/29/2009  . MASS, LUNG 10/13/2009  . NASAL FRACTURE 08/12/2009  . OSTEOARTHRITIS 03/31/2008   Hips  . OSTEOPOROSIS 12/27/2007  . PEDAL EDEMA 12/27/2007  . Pneumonia 07/10/2016  . PULMONARY NODULE 10/21/2009  . RESPIRATORY FAILURE, CHRONIC 06/29/2010  . Shortness of breath dyspnea     Patient Active Problem List   Diagnosis Date Noted  . Pedal edema 05/15/2017  . Bronchiectasis without acute exacerbation (HCC) 10/14/2016  .  Parapneumonic effusion 08/16/2016  . Acute respiratory failure with hypoxia (HCC) 07/04/2015  . Community acquired pneumonia 07/02/2015  . COPD GOLD III  07/02/2015  . Normocytic normochromic anemia 07/02/2015  . Dysphagia 02/25/2015  . Depression 09/25/2013  . Chronic respiratory failure with hypoxia (HCC) 06/29/2010  . PULMONARY NODULE 10/21/2009  . MASS, LUNG 10/13/2009  . NASAL FRACTURE 08/12/2009  . ESOPHAGEAL STRICTURE 01/29/2009  . B12 DEFICIENCY 03/31/2008  . Osteoarthritis 03/31/2008  . Osteoporosis 12/27/2007    Past Surgical History:  Procedure Laterality Date  . APPENDECTOMY     '38  . BALLOON DILATION N/A 05/10/2015   Procedure: BALLOON DILATION;  Surgeon: Louis Meckelobert D Kaplan, MD;  [facility];  Service: Endoscopy;  Laterality: N/A;  . BALLOON DILATION N/A 08/02/2015   Procedure: BALLOON DILATION;  Surgeon: Louis Meckelobert D Kaplan, MD;  Location: [facility];  Service: Endoscopy;  Laterality: N/A;  . BREAST SURGERY     biopsy '47  . CATARACT EXTRACTION Bilateral    last '05  . CHOLECYSTECTOMY     laparoscopic'10  . ESOPHAGOGASTRODUODENOSCOPY     with dilatation x3  . ESOPHAGOGASTRODUODENOSCOPY (EGD) WITH PROPOFOL N/A 05/10/2015   Procedure: ESOPHAGOGASTRODUODENOSCOPY (EGD) WITH PROPOFOL;  Surgeon: Louis Meckelobert D Kaplan, MD;  Location: [facility];  Service: Endoscopy;  Laterality: N/A;  balloon dilation  . ESOPHAGOGASTRODUODENOSCOPY (EGD) WITH PROPOFOL N/A 08/02/2015   Procedure: ESOPHAGOGASTRODUODENOSCOPY (EGD) WITH PROPOFOL;  Surgeon: Louis Meckelobert D Kaplan, MD;  Location: [facility];  Service: Endoscopy;  Laterality: N/A;  . LUMBAR LAMINECTOMY    . NASAL RECONSTRUCTION WITH SEPTAL REPAIR     '10  . RECTAL PROLAPSE REPAIR     '06  . skin graf     '98    OB History    No data available       Home Medications    Prior to Admission medications   Medication Sig Start Date End Date Taking? Authorizing Provider  ALPRAZolam Prudy Feeler(XANAX) 0.25 MG tablet TAKE 1 TABLET BY  MOUTH THREE TIMES DAILY AS NEEDED FOR ANXIETY 07/11/17   Gordy SaversKwiatkowski, Peter F, MD  azithromycin [facility](ZITHROMAX) 250 MG tablet Take 2 tablets daily for three consecutive days 07/13/17   Gordy SaversKwiatkowski, Peter F, MD  azithromycin (ZITHROMAX) 500 MG tablet Take 1 tablet (500 mg total) by mouth daily. 07/18/17   Parrett, Virgel Bouquetammy S, NP  Calcium Carbonate-Vitamin D (CALTRATE 600+D) 600-400 MG-UNIT per chew tablet Chew 1 tablet by mouth every morning.     [provider]  cyanocobalamin (,VITAMIN B-12,) 1000 MCG/ML injection INJECT 1 ML IN THE MUSCLE ONCE MONTHLY 10/23/16   Gordy SaversKwiatkowski, Peter F, MD  escitalopram (LEXAPRO) 5 MG tablet TAKE 1 TABLET BY MOUTH DAILY 05/16/17   Gordy SaversKwiatkowski, Peter F, MD  Ipratropium-Albuterol (COMBIVENT) 20-100 MCG/ACT AERS respimat Inhale 1 puff into the lungs every 6 (six) hours as needed for wheezing or shortness of breath. 05/15/17   Gordy SaversKwiatkowski, Peter F, MD  mirtazapine (REMERON) 15 MG tablet TAKE 1 TABLET BY MOUTH EVERY NIGHT AT BEDTIME 06/05/17   Gordy SaversKwiatkowski, Peter F, MD  mometasone-formoterol (DULERA) 200-5 MCG/ACT AERO Inhale 2 puffs into the lungs 2 (two) times daily. 10/20/16   Nyoka CowdenWert, Michael B, MD  Multiple Vitamins-Minerals (ICAPS AREDS 2) CAPS Take 1 capsule by mouth 2 (two) times daily.    [provider]  naproxen sodium (ALEVE) 220 MG tablet Take 220 mg by mouth as needed.    [provider]  OXYGEN Oxygen 3lpm 24/7    [provider]  Polyethyl Glycol-Propyl Glycol (SYSTANE OP) Place 1 drop into both eyes 4 (four) times daily as needed (for dry eyes).     [provider]    Family History Family History  Problem Relation Age of Onset  . Hypertension Son   . Asthma Mother   . Abdominal Wall Hernia Mother   . Stomach cancer Mother     Social History Social History  Substance Use Topics  . Smoking status: Former Smoker    Packs/day: 0.50    Years: 35.00    Quit date: 12/18/1978  . Smokeless tobacco: Never Used  . Alcohol use  0.0 oz/week     Comment: couple of gin and tonics daily     Allergies   Penicillins   Review of Systems Review of Systems Ten systems reviewed and are negative for acute change, except as noted in the HPI.   Physical Exam Updated Vital Signs BP 131/74   Pulse 72   Temp 98.1 F (36.7 C) (Oral)   Resp 19   Ht 5\' 5"  (1.651 m)   Wt 43.1 kg (95 lb)   SpO2 98%   BMI 15.81 kg/m   Physical Exam  Constitutional: She is oriented to person, place, and time. She appears well-developed and well-nourished. No distress.  Thin elderly female in NAD  HENT:  Head: Normocephalic and atraumatic.  Eyes: Pupils are equal, round, and reactive to light. Conjunctivae and EOM are normal. No scleral icterus.  Neck: Normal range  of motion.  Cardiovascular: Normal rate, regular rhythm and normal heart sounds.  Exam reveals no gallop and no friction rub.   No murmur heard. Pulmonary/Chest: Effort normal. She has no wheezes. She has no rales.  Thick, productive, rattling cough  Abdominal: Soft. Bowel sounds are normal. She exhibits no distension and no mass. There is no tenderness. There is no guarding.  Neurological: She is alert and oriented to person, place, and time.  Skin: Skin is warm and dry. She is not diaphoretic.  Psychiatric: Her behavior is normal.  Nursing note and vitals reviewed.    ED Treatments / Results  Labs (all labs ordered are listed, but only abnormal results are displayed) Labs Reviewed  BASIC METABOLIC PANEL - Abnormal; Notable for the following:       Result Value   Sodium 132 (*)    Chloride 95 (*)    Glucose, Bld 101 (*)    All other components within normal limits  CBC - Abnormal; Notable for the following:    RBC 3.60 (*)    Hemoglobin 10.8 (*)    HCT 33.2 (*)    Platelets 462 (*)    All other components within normal limits  I-STAT TROPONIN, ED    EKG  EKG Interpretation  Date/Time:  Thursday July 19 2017 11:21:37 EDT Ventricular Rate:  78 PR  Interval:  152 QRS Duration: 72 QT Interval:  376 QTC Calculation: 428 R Axis:   87 Text Interpretation:  Sinus rhythm with Premature atrial complexes ST elevation, consider early repolarization, pericarditis, or injury Abnormal ECG No acute changes No significant change since last tracing Confirmed by Derwood Kaplan ([id number]) on 07/19/2017 12:35:00 PM       Radiology Dg Chest 2 View  Result Date: 07/19/2017 CLINICAL DATA:  Shortness of breath and cough EXAM: CHEST  2 VIEW COMPARISON:  July 16, 2017 and October 13, 2016 FINDINGS: There is a persistent fairly small left pleural effusion with left base atelectasis. There is mild chronic scarring in the right base. There is chronic interstitial thickening bilaterally, primarily in the upper lobes, stable. There is no frank edema or consolidation. The heart size and pulmonary vascularity are within normal limits. No evident adenopathy. There is aortic atherosclerosis. There is postoperative change in the upper lumbar region. IMPRESSION: Persistent small left pleural effusion with left base atelectasis. Areas of chronic scarring and fibrotic type change. No frank edema or consolidation noted. It should be noted that mild interstitial edema could be obscured by the overall interstitial prominence elsewhere. Stable cardiac silhouette.  There is aortic atherosclerosis. Aortic Atherosclerosis (ICD10-I70.0). Electronically Signed   By: Bretta Bang III M.D.   On: 07/19/2017 12:07   Ct Angio Chest Pe W And/or Wo Contrast  Result Date: 07/19/2017 CLINICAL DATA:  83 year old female with central chest pain, productive cough and elevated D-dimer EXAM: CT ANGIOGRAPHY CHEST WITH CONTRAST TECHNIQUE: Multidetector CT imaging of the chest was performed using the standard protocol during bolus administration of intravenous contrast. Multiplanar CT image reconstructions and MIPs were obtained to evaluate the vascular anatomy. CONTRAST:  100 mL Isovue 370 COMPARISON:   Prior chest x-ray obtained earlier today FINDINGS: Cardiovascular: Excellent opacification of the pulmonary arteries to the proximal subsegmental level. No evidence of acute pulmonary embolus. Mild cardiomegaly. Trace pericardial fluid versus thickening. Atherosclerotic calcifications noted along the course of the left anterior descending coronary artery. Calcifications are also present throughout the thoracic aorta. Mediastinum/Nodes: Unremarkable CT appearance of the thyroid gland. No suspicious mediastinal or  hilar adenopathy. No soft tissue mediastinal mass. The thoracic esophagus is unremarkable. Lungs/Pleura: Small left pleural effusion. Advanced centrilobular pulmonary emphysema. Respiratory motion artifact slightly limits evaluation for small pulmonary nodules. Multifocal regions of peripheral nodular opacity in the right upper lobe. The largest measures 1.3 cm in diameter on image 24 of series 9. Additional scattered benign calcified granulomas are also present. Patchy airspace opacity is present in the periphery of the right middle lobe. Diffuse mild bronchial wall thickening. Atelectasis and/ or consolidation in the central aspect of the inferior left lower lobe with multiple small air bronchograms. This appears to be secondary to a focal obstruction of the left lower lobe bronchus (image 47 of series 9). 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 Previously this measured 3.1 by 2.1 cm. 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 No acute abnormality. Musculoskeletal: No acute osseous abnormality. Advanced lumbar degenerative disc disease is incompletely imaged. Review of the MIP images confirms the above findings. IMPRESSION: 1. Negative for acute pulmonary embolus. 2. Multifocal peripherally based patchy airspace opacities noted in the right upper and right  middle lobe concerning for a multifocal infectious/inflammatory process. 3. Advanced centrilobular pulmonary emphysema. 4. Chronic atelectasis and/or scarring in the medial aspect of the left lower lobe secondary to partial obstruction of the left lower lobe bronchus. A small amount of superimposed infectious infiltrate is difficult to exclude. This may be secondary to inspissated secretions, scarring from prior infection, or possibly an endobronchial neoplasm. 5. Coronary artery calcifications. 6. Small layering left pleural effusion may be secondary to the likely chronic volume loss in the left lower lobe. 7. 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 8. Additional ancillary findings as above without significant interval change. Aortic Atherosclerosis (ICD10-I70.0) and Emphysema (ICD10-J43.9). Electronically Signed   By: Malachy Moan M.D.   On: 07/19/2017 14:26    Procedures Procedures (including critical care time)  Medications Ordered in ED Medications  levofloxacin (LEVAQUIN) IVPB 750 mg (750 mg Intravenous New Bag/Given 07/19/17 1503)  iopamidol (ISOVUE-370) 76 % injection (100 mLs  Contrast Given 07/19/17 1349)     Initial Impression / Assessment and Plan / ED Course  I have reviewed the triage vital signs and the nursing notes.  Pertinent labs & imaging results that were available during my care of the patient were reviewed by me and considered in my medical decision making (see chart for details).  Clinical Course as of Jul 20 1535  Thu Jul 19, 2017  1442 Patient with   [AH]    Clinical Course User Index [AH] Arthor Captain, PA-C    Patient with multifocal pneumonia, she does not meet sepsis criteria. She has had 1-1/2 rounds of outpatient azithromycin. She will be given IV Levaquin and required admission. Her symptoms of esophageal spasm and he'll impaction R resolve. Incidental findings of coronary atherosclerosis noted and  discussed with patient and family.  Final Clinical Impressions(s) / ED Diagnoses   Final diagnoses:  Multifocal pneumonia  Unilateral emphysema (HCC)  Atherosclerosis of coronary artery of native heart without angina pectoris, unspecified vessel or lesion type  Supplemental oxygen dependent    New Prescriptions New Prescriptions   No medications on file     Arthor Captain, PA-C 07/22/17 1603    Derwood Kaplan, MD 07/23/17 [phone]

## 2017-07-19 NOTE — ED Notes (Signed)
Attempted report 

## 2017-07-19 NOTE — ED Notes (Signed)
Pt wheeled back to room from waiting area. Pt assisted into bed and into gown. Switched pt from home o2 to the room o2, 2L. Pt placed on monitor.

## 2017-07-20 DIAGNOSIS — J9811 Atelectasis: Secondary | ICD-10-CM | POA: Diagnosis present

## 2017-07-20 DIAGNOSIS — J9620 Acute and chronic respiratory failure, unspecified whether with hypoxia or hypercapnia: Secondary | ICD-10-CM | POA: Diagnosis present

## 2017-07-20 DIAGNOSIS — F320 Major depressive disorder, single episode, mild: Secondary | ICD-10-CM

## 2017-07-20 DIAGNOSIS — F329 Major depressive disorder, single episode, unspecified: Secondary | ICD-10-CM | POA: Diagnosis present

## 2017-07-20 DIAGNOSIS — Z79899 Other long term (current) drug therapy: Secondary | ICD-10-CM | POA: Diagnosis not present

## 2017-07-20 DIAGNOSIS — J90 Pleural effusion, not elsewhere classified: Secondary | ICD-10-CM | POA: Diagnosis present

## 2017-07-20 DIAGNOSIS — M810 Age-related osteoporosis without current pathological fracture: Secondary | ICD-10-CM | POA: Diagnosis present

## 2017-07-20 DIAGNOSIS — J449 Chronic obstructive pulmonary disease, unspecified: Secondary | ICD-10-CM | POA: Diagnosis not present

## 2017-07-20 DIAGNOSIS — J430 Unilateral pulmonary emphysema [MacLeod's syndrome]: Secondary | ICD-10-CM | POA: Diagnosis present

## 2017-07-20 DIAGNOSIS — J9600 Acute respiratory failure, unspecified whether with hypoxia or hypercapnia: Secondary | ICD-10-CM | POA: Diagnosis present

## 2017-07-20 DIAGNOSIS — Z9981 Dependence on supplemental oxygen: Secondary | ICD-10-CM

## 2017-07-20 DIAGNOSIS — M1990 Unspecified osteoarthritis, unspecified site: Secondary | ICD-10-CM | POA: Diagnosis present

## 2017-07-20 DIAGNOSIS — J9601 Acute respiratory failure with hypoxia: Secondary | ICD-10-CM

## 2017-07-20 DIAGNOSIS — J181 Lobar pneumonia, unspecified organism: Secondary | ICD-10-CM | POA: Diagnosis not present

## 2017-07-20 DIAGNOSIS — D649 Anemia, unspecified: Secondary | ICD-10-CM | POA: Diagnosis present

## 2017-07-20 DIAGNOSIS — F419 Anxiety disorder, unspecified: Secondary | ICD-10-CM | POA: Diagnosis present

## 2017-07-20 DIAGNOSIS — R1310 Dysphagia, unspecified: Secondary | ICD-10-CM

## 2017-07-20 DIAGNOSIS — J189 Pneumonia, unspecified organism: Secondary | ICD-10-CM | POA: Diagnosis not present

## 2017-07-20 DIAGNOSIS — R05 Cough: Secondary | ICD-10-CM | POA: Diagnosis not present

## 2017-07-20 DIAGNOSIS — J440 Chronic obstructive pulmonary disease with acute lower respiratory infection: Secondary | ICD-10-CM | POA: Diagnosis present

## 2017-07-20 DIAGNOSIS — I2510 Atherosclerotic heart disease of native coronary artery without angina pectoris: Secondary | ICD-10-CM | POA: Diagnosis present

## 2017-07-20 DIAGNOSIS — G4700 Insomnia, unspecified: Secondary | ICD-10-CM | POA: Diagnosis present

## 2017-07-20 DIAGNOSIS — Z880 Allergy status to penicillin: Secondary | ICD-10-CM | POA: Diagnosis not present

## 2017-07-20 LAB — BASIC METABOLIC PANEL
Anion gap: 7 (ref 5–15)
BUN: 8 mg/dL (ref 6–20)
CO2: 26 mmol/L (ref 22–32)
CREATININE: 0.56 mg/dL (ref 0.44–1.00)
Calcium: 8.8 mg/dL — ABNORMAL LOW (ref 8.9–10.3)
Chloride: 100 mmol/L — ABNORMAL LOW (ref 101–111)
GFR calc Af Amer: >60 mL/min (ref >60)
GLUCOSE: 82 mg/dL (ref 65–99)
POTASSIUM: 4.2 mmol/L (ref 3.5–5.1)
SODIUM: 133 mmol/L — AB (ref 135–145)

## 2017-07-20 LAB — CBC
HCT: 28.6 % — ABNORMAL LOW (ref 36.0–46.0)
Hemoglobin: 9.3 g/dL — ABNORMAL LOW (ref 12.0–15.0)
MCH: 29.6 pg (ref 26.0–34.0)
MCHC: 32.5 g/dL (ref 30.0–36.0)
MCV: 91.1 fL (ref 78.0–100.0)
PLATELETS: 353 10*3/uL (ref 150–400)
RBC: 3.14 MIL/uL — AB (ref 3.87–5.11)
RDW: 13.6 % (ref 11.5–15.5)
WBC: 8.8 10*3/uL (ref 4.0–10.5)

## 2017-07-20 LAB — GLUCOSE, CAPILLARY: Glucose-Capillary: 108 mg/dL — ABNORMAL HIGH (ref 65–99)

## 2017-07-20 LAB — EXPECTORATED SPUTUM ASSESSMENT W REFEX TO RESP CULTURE

## 2017-07-20 LAB — STREP PNEUMONIAE URINARY ANTIGEN: Strep Pneumo Urinary Antigen: NEGATIVE

## 2017-07-20 MED ORDER — IPRATROPIUM-ALBUTEROL 0.5-2.5 (3) MG/3ML IN SOLN: 3.0000 mL | RESPIRATORY_TRACT | Status: DC | PRN

## 2017-07-20 MED ORDER — ORAL CARE MOUTH RINSE
15.0000 mL | Freq: Two times a day (BID) | OROMUCOSAL | Status: DC
  Administered 2017-07-20 – 2017-07-22 (×6): 15 mL via OROMUCOSAL

## 2017-07-20 MED ORDER — GUAIFENESIN-DM 100-10 MG/5ML PO SYRP: 10.0000 mL | ORAL_SOLUTION | ORAL | Status: DC | PRN

## 2017-07-20 NOTE — Progress Notes (Signed)
Patient is having a difficult time swallowing PO Mucinex. MD notified. Orders placed. Will continue to monitor.

## 2017-07-20 NOTE — Progress Notes (Signed)
PROGRESS NOTE    Sharon [name]  ZOX:[id number] DOB: 1923-09-02 DOA: 07/19/2017 PCP: Gordy SaversKwiatkowski, Peter F, MD   Chief Complaint  Patient presents with  . Shortness of Breath    Brief Narrative:  83 year old female with history of COPD on 3 L of home oxygen, esophageal stricture with chronic dysphagia, anemia, presented with cough and shortness of breath for 3 weeks. Patient had 2 courses of antibiotics without improvement. Recently saw her pulmonologist who placed her on another course of azithromycin starting 07/18/2017. Assessment & Plan   Acute on chronic respiratory failure/possible community-acquired pneumonia -Chest x-ray showed persistent small left pleural effusion with left base atelectasis, chronic scarring. -CTA chest negative for pulmonary embolism. Multifocal peripheral based patchy airspace opacities in the right upper and middle lobes concerning for multifocal infectious/inflammatory process. -Upon review of patient's records, she recently completed Levaquin seven-day course of the beginning of July 2018. She then completed course of azithromycin. She was thought to have COPD exacerbation. -Currently she is not wheezing.  -Patient presented to her pulmonology office on 07/18/2017, and was given another course of azithromycin. -Currently she is on home oxygen, 3 L -Will continue IV Levaquin (will not switch over to oral at this time given patient has had problems with swallowing- she took a tablet of azithromycin prior to admission, and had problems swallowing the pill)  -Will change oral tablet Mucinex to syrup -Continue nebulizer treatments, dulera -will order incentive spirometry and flutter valve  -sputum culture if possible   Advanced COPD/chronic respiratory failure -Currently patient does not appear to be wheezing, will not add on steroids at this time -Continue neb treatments as needed  Depression/anxiety -Continue Lexapro, Xanax as needed  Insomnia -Continue  Remeron  Normocytic anemia -Baseline hemoglobin approximately 11, currently 9.3 -Suspect drop in hemoglobin secondary to dilutional component as patient was receiving IV fluids  History of esophageal stricture with chronic dysphagia -Will continue to monitor -If necessary, will place patient on dysphagia diet  DVT Prophylaxis  Lovenox  Code Status: Full  Family Communication: Son at bedside  Disposition Plan: Admitted, suspect discharge to home within 24-48 hours pending improvement  Consultants None  Procedures  None  Antibiotics   Anti-infectives    Start     Dose/Rate Route Frequency Ordered Stop   07/21/17 1600  levofloxacin (LEVAQUIN) IVPB 750 mg     750 mg 100 mL/hr over 90 Minutes Intravenous Every 48 hours 07/19/17 2120 07/27/17 1559   07/20/17 1000  levofloxacin (LEVAQUIN) IVPB 500 mg  Status:  Discontinued     500 mg 100 mL/hr over 60 Minutes Intravenous Every 24 hours 07/19/17 2011 07/19/17 2120   07/19/17 2100  levofloxacin (LEVAQUIN) IVPB 750 mg  Status:  Discontinued     750 mg 100 mL/hr over 90 Minutes Intravenous Every 48 hours 07/19/17 1855 07/19/17 2011   07/19/17 1500  levofloxacin (LEVAQUIN) IVPB 750 mg     750 mg 100 mL/hr over 90 Minutes Intravenous  Once 07/19/17 1445 07/19/17 1633      Subjective:   Sharon [name] seen and examined today.  Patient continues to have productive cough with green sputum production. Continues to feel short of breath, particularly after coughing spell and with exertion. Currently denies any chest pain, abdominal pain, nausea vomiting, diarrhea or constipation, headache or dizziness. Admits to having problems with swallowing certain pills and has had to have herself to get stretched the past.  Objective:   Vitals:   07/19/17 1956 07/19/17 2118 07/20/17 0516 07/20/17  0920  BP:  (!) 119/55 138/61 (!) 110/57  Pulse: 74 83 75 75  Resp: (!) 22 20 18 20   Temp:  98 F (36.7 C) 98.4 F (36.9 C) (!) 97.5 F (36.4 C)    TempSrc:  Oral Oral Oral  SpO2: 96% 94% 97% 97%  Weight:  43.8 kg (96 lb 9.6 oz)    Height:        Intake/Output Summary (Last 24 hours) at 07/20/17 1335 Last data filed at 07/20/17 0800  Gross per 24 hour  Intake            642.5 ml  Output              400 ml  Net            242.5 ml   Filed Weights   07/19/17 1122 07/19/17 2118  Weight: 43.1 kg (95 lb) 43.8 kg (96 lb 9.6 oz)    Exam  General: Well developed, elderly, NAD, younger than stated age  HEENT: NCAT, mucous membranes moist.   Cardiovascular: S1 S2 auscultated, no rubs, murmurs or gallops. Regular rate and rhythm.  Respiratory: Diminished breath sounds throughout, no wheezing, +cough  Abdomen: Soft, nontender, nondistended, + bowel sounds  Extremities: warm dry without cyanosis clubbing or edema  Neuro: AAOx3, nonfocal  Psych: Normal affect and demeanor with intact judgement and insight, pleasant   Data Reviewed: I have personally reviewed following labs and imaging studies  CBC:  Recent Labs Lab 07/18/17 1241 07/19/17 1132 07/20/17 0542  WBC 10.3 9.6 8.8  NEUTROABS 7.4  --   --   HGB 11.7* 10.8* 9.3*  HCT 35.1* 33.2* 28.6*  MCV 93.5 92.2 91.1  PLT 496.0* 462* 353   Basic Metabolic Panel:  Recent Labs Lab 07/18/17 1241 07/19/17 1132 07/20/17 0542  NA 130* 132* 133*  K 4.2 4.1 4.2  CL 91* 95* 100*  CO2 32 27 26  GLUCOSE 105* 101* 82  BUN 11 9 8   CREATININE 0.57 0.54 0.56  CALCIUM 10.3 9.6 8.8*   GFR: Estimated Creatinine Clearance: 31 mL/min (by C-G formula based on SCr of 0.56 mg/dL). Liver Function Tests: No results for input(s): AST, ALT, ALKPHOS, BILITOT, PROT, ALBUMIN in the last 168 hours. No results for input(s): LIPASE, AMYLASE in the last 168 hours. No results for input(s): AMMONIA in the last 168 hours. Coagulation Profile: No results for input(s): INR, PROTIME in the last 168 hours. Cardiac Enzymes: No results for input(s): CKTOTAL, CKMB, CKMBINDEX, TROPONINI in the  last 168 hours. BNP (last 3 results)  Recent Labs  07/18/17 1241  PROBNP 67.0   HbA1C: No results for input(s): HGBA1C in the last 72 hours. CBG: No results for input(s): GLUCAP in the last 168 hours. Lipid Profile: No results for input(s): CHOL, HDL, LDLCALC, TRIG, CHOLHDL, LDLDIRECT in the last 72 hours. Thyroid Function Tests: No results for input(s): TSH, T4TOTAL, FREET4, T3FREE, THYROIDAB in the last 72 hours. Anemia Panel: No results for input(s): VITAMINB12, FOLATE, FERRITIN, TIBC, IRON, RETICCTPCT in the last 72 hours. Urine analysis:    Component Value Date/Time   COLORURINE YELLOW 06/05/2010 1739   APPEARANCEUR CLEAR 06/05/2010 1739   LABSPEC 1.018 06/05/2010 1739   PHURINE 7.0 06/05/2010 1739   GLUCOSEU NEGATIVE 06/05/2010 1739   HGBUR NEGATIVE 06/05/2010 1739   HGBUR negative 03/24/2008 1013   BILIRUBINUR n 02/08/2015 1023   KETONESUR 40 (A) 06/05/2010 1739   PROTEINUR n 02/08/2015 1023   PROTEINUR NEGATIVE 06/05/2010 1739  UROBILINOGEN 0.2 02/08/2015 1023   UROBILINOGEN 0.2 06/05/2010 1739   NITRITE n 02/08/2015 1023   NITRITE NEGATIVE 06/05/2010 1739   LEUKOCYTESUR small (1+) 02/08/2015 1023   Sepsis Labs: @LABRCNTIP (procalcitonin:4,lacticidven:4)  )No results found for this or any previous visit (from the past 240 hour(s)).    Radiology Studies: Dg Chest 2 View  Result Date: 07/19/2017 CLINICAL DATA:  Shortness of breath and cough EXAM: CHEST  2 VIEW COMPARISON:  July 16, 2017 and October 13, 2016 FINDINGS: There is a persistent fairly small left pleural effusion with left base atelectasis. There is mild chronic scarring in the right base. There is chronic interstitial thickening bilaterally, primarily in the upper lobes, stable. There is no frank edema or consolidation. The heart size and pulmonary vascularity are within normal limits. No evident adenopathy. There is aortic atherosclerosis. There is postoperative change in the upper lumbar region.  IMPRESSION: Persistent small left pleural effusion with left base atelectasis. Areas of chronic scarring and fibrotic type change. No frank edema or consolidation noted. It should be noted that mild interstitial edema could be obscured by the overall interstitial prominence elsewhere. Stable cardiac silhouette.  There is aortic atherosclerosis. Aortic Atherosclerosis (ICD10-I70.0). Electronically Signed   By: Bretta BangWilliam  Woodruff III M.D.   On: 07/19/2017 12:07   Ct Angio Chest Pe W And/or Wo Contrast  Result Date: 07/19/2017 CLINICAL DATA:  83 year old female with central chest pain, productive cough and elevated D-dimer EXAM: CT ANGIOGRAPHY CHEST WITH CONTRAST TECHNIQUE: Multidetector CT imaging of the chest was performed using the standard protocol during bolus administration of intravenous contrast. Multiplanar CT image reconstructions and MIPs were obtained to evaluate the vascular anatomy. CONTRAST:  100 mL Isovue 370 COMPARISON:  Prior chest x-ray obtained earlier today FINDINGS: Cardiovascular: Excellent opacification of the pulmonary arteries to the proximal subsegmental level. No evidence of acute pulmonary embolus. Mild cardiomegaly. Trace pericardial fluid versus thickening. Atherosclerotic calcifications noted along the course of the left anterior descending coronary artery. Calcifications are also present throughout the thoracic aorta. Mediastinum/Nodes: Unremarkable CT appearance of the thyroid gland. No suspicious mediastinal or hilar adenopathy. No soft tissue mediastinal mass. The thoracic esophagus is unremarkable. Lungs/Pleura: Small left pleural effusion. Advanced centrilobular pulmonary emphysema. Respiratory motion artifact slightly limits evaluation for small pulmonary nodules. Multifocal regions of peripheral nodular opacity in the right upper lobe. The largest measures 1.3 cm in diameter on image 24 of series 9. Additional scattered benign calcified granulomas are also present. Patchy  airspace opacity is present in the periphery of the right middle lobe. Diffuse mild bronchial wall thickening. Atelectasis and/ or consolidation in the central aspect of the inferior left lower lobe with multiple small air bronchograms. This appears to be secondary to a focal obstruction of the left lower lobe bronchus (image 47 of series 9). 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 Previously this measured 3.1 by 2.1 cm. 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 No acute abnormality. Musculoskeletal: No acute osseous abnormality. Advanced lumbar degenerative disc disease is incompletely imaged. Review of the MIP images confirms the above findings. IMPRESSION: 1. Negative for acute pulmonary embolus. 2. Multifocal peripherally based patchy airspace opacities noted in the right upper and right middle lobe concerning for a multifocal infectious/inflammatory process. 3. Advanced centrilobular pulmonary emphysema. 4. Chronic atelectasis and/or scarring in the medial aspect of the left lower lobe secondary to partial obstruction of the  left lower lobe bronchus. A small amount of superimposed infectious infiltrate is difficult to exclude. This may be secondary to inspissated secretions, scarring from prior infection, or possibly an endobronchial neoplasm. 5. Coronary artery calcifications. 6. Small layering left pleural effusion may be secondary to the likely chronic volume loss in the left lower lobe. 7. 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 8. Additional ancillary findings as above without significant interval change. Aortic Atherosclerosis (ICD10-I70.0) and Emphysema (ICD10-J43.9). Electronically Signed   By: Malachy Moan M.D.   On: 07/19/2017 14:26     Scheduled Meds: . artificial tears   Both Eyes Daily  .  calcium-vitamin D  1 tablet Oral Q breakfast  . enoxaparin (LOVENOX) injection  30 mg Subcutaneous Q24H  . escitalopram  5 mg Oral Daily  . mouth rinse  15 mL Mouth Rinse BID  . mirtazapine  15 mg Oral QHS  . mometasone-formoterol  2 puff Inhalation BID  . multivitamin  1 tablet Oral Daily   Continuous Infusions: . [START ON 07/21/2017] levofloxacin (LEVAQUIN) IV       LOS: 0 days   Time Spent in minutes   30 minutes  Jabarie Pop D.O. on 07/20/2017 at 1:35 PM  Between 7am to 7pm - Pager - [phone]  After 7pm go to [email or web address] - password [email or web address]  And look for the night coverage person covering for me after hours  [facility] Office  [phone]

## 2017-07-21 DIAGNOSIS — J189 Pneumonia, unspecified organism: Principal | ICD-10-CM

## 2017-07-21 LAB — BASIC METABOLIC PANEL
ANION GAP: 5 (ref 5–15)
BUN: 8 mg/dL (ref 6–20)
CO2: 27 mmol/L (ref 22–32)
Calcium: 8.7 mg/dL — ABNORMAL LOW (ref 8.9–10.3)
Chloride: 101 mmol/L (ref 101–111)
Creatinine, Ser: 0.51 mg/dL (ref 0.44–1.00)
GFR calc Af Amer: >60 mL/min (ref >60)
GLUCOSE: 92 mg/dL (ref 65–99)
POTASSIUM: 4 mmol/L (ref 3.5–5.1)
Sodium: 133 mmol/L — ABNORMAL LOW (ref 135–145)

## 2017-07-21 LAB — CBC
HEMATOCRIT: 28.5 % — AB (ref 36.0–46.0)
Hemoglobin: 9.6 g/dL — ABNORMAL LOW (ref 12.0–15.0)
MCH: 30.3 pg (ref 26.0–34.0)
MCHC: 33.7 g/dL (ref 30.0–36.0)
MCV: 89.9 fL (ref 78.0–100.0)
PLATELETS: 372 10*3/uL (ref 150–400)
RBC: 3.17 MIL/uL — AB (ref 3.87–5.11)
RDW: 13.5 % (ref 11.5–15.5)
WBC: 8.6 10*3/uL (ref 4.0–10.5)

## 2017-07-21 LAB — GLUCOSE, CAPILLARY
Glucose-Capillary: 95 mg/dL (ref 65–99)
Glucose-Capillary: 94 mg/dL (ref 65–99)

## 2017-07-21 NOTE — Progress Notes (Signed)
PROGRESS NOTE    Sharon [name]  ZOX:[id number] DOB: Nov 11, 1923 DOA: 07/19/2017 PCP: Gordy Savers, MD   Chief Complaint  Patient presents with  . Shortness of Breath    Brief Narrative:  83 year old female with history of COPD on 3 L of home oxygen, esophageal stricture with chronic dysphagia, anemia, presented with cough and shortness of breath for 3 weeks. Patient had 2 courses of antibiotics without improvement. Recently saw her pulmonologist who placed her on another course of azithromycin starting 07/18/2017.  She is admitted for outpatient free treatment, to acquired pneumonia) COPD Assessment & Plan   Acute on chronic respiratory failure/possible community-acquired pneumonia -Chest x-ray- persistent small left pleural effusion with left base atelectasis, chronic scarring. -CTA chest negative for pulmonary embolism. Multifocal peripheral based patchy airspace opacities in the right upper and middle lobes concerning for multifocal infectious/inflammatory process. -Upon review of patient's records, she recently completed Levaquin seven-day course of the beginning of July 2018. She then completed course of azithromycin. She was thought to have COPD exacerbation. -Currently she is not wheezing.  -Patient presented to her pulmonology office on 07/18/2017, and was given another course of azithromycin. -Currently she is on home oxygen, 3 L -Will continue IV Levaquin (will not switch over to oral at this time given patient has had problems with swallowing- she took a tablet of azithromycin prior to admission, and had problems swallowing the pill)  -Will change oral tab muciex changed to  Mucinex syrup -Continue nebulizer treatments, dulera - incentive spirometry and flutter valve  -sputum culture if possible  Advanced COPD/chronic respiratory failure -No wheezing -  Steroid not given -Continue neb treatments as needed  Depression/anxiety -Continue Lexapro, Xanax as  needed  Insomnia -Continue Remeron  Normocytic anemia -Baseline hemoglobin approximately 11, currently 9.3 -Suspect drop in hemoglobin secondary to dilutional component as patient was receiving IV fluids  History of esophageal stricture with chronic dysphagia -Will continue to monitor -If necessary, will place patient on dysphagia diet  DVT Prophylaxis  Lovenox  Code Status: Full  Family Communication: Son at bedside  Disposition Plan: Discharge tomorrow if all is well  Consultants None  Procedures  None  Antibiotics   Anti-infectives    Start     Dose/Rate Route Frequency Ordered Stop   07/21/17 1600  levofloxacin (LEVAQUIN) IVPB 750 mg     750 mg 100 mL/hr over 90 Minutes Intravenous Every 48 hours 07/19/17 2120 07/27/17 1559   07/20/17 1000  levofloxacin (LEVAQUIN) IVPB 500 mg  Status:  Discontinued     500 mg 100 mL/hr over 60 Minutes Intravenous Every 24 hours 07/19/17 2011 07/19/17 2120   07/19/17 2100  levofloxacin (LEVAQUIN) IVPB 750 mg  Status:  Discontinued     750 mg 100 mL/hr over 90 Minutes Intravenous Every 48 hours 07/19/17 1855 07/19/17 2011   07/19/17 1500  levofloxacin (LEVAQUIN) IVPB 750 mg     750 mg 100 mL/hr over 90 Minutes Intravenous  Once 07/19/17 1445 07/19/17 1633      Subjective:   Clarisse Gouge seen and examined today.  Admitting H&P reviewed and noted some updated at bedside with patient. No acute events reported overnight. Patient is improving and will be discharged tomorrow if all is well. No fever or chills.  Objective:   Vitals:   07/20/17 2029 07/21/17 0525 07/21/17 0826 07/21/17 0920  BP:  123/68  132/72  Pulse:  87  73  Resp:  20  18  Temp:  98.5 F (36.9 C)  98.2 F (36.8 C)  TempSrc:  Oral  Oral  SpO2: 97% 98% 96% 95%  Weight:      Height:        Intake/Output Summary (Last 24 hours) at 07/21/17 1427 Last data filed at 07/21/17 1415  Gross per 24 hour  Intake              720 ml  Output              600 ml   Net              120 ml   Filed Weights   07/19/17 1122 07/19/17 2118  Weight: 43.1 kg (95 lb) 43.8 kg (96 lb 9.6 oz)    Exam  General: Resting comfortably, NAD  HEENT: NCAT, mucous membranes moist.   Cardiovascular: RRR,  no rubs, murmurs or gallops.  Respiratory: Diminished breath sounds, no wheezing  Abdomen: Soft, nontender, nondistended, + bowel sounds  Extremities: no cyanosis clubbing or edema  Neuro: AAOx3, nonfocal Psych: Normal affect   Data Reviewed: I have personally reviewed following labs and imaging studies  CBC:  Recent Labs Lab 07/18/17 1241 07/19/17 1132 07/20/17 0542 07/21/17 0623  WBC 10.3 9.6 8.8 8.6  NEUTROABS 7.4  --   --   --   HGB 11.7* 10.8* 9.3* 9.6*  HCT 35.1* 33.2* 28.6* 28.5*  MCV 93.5 92.2 91.1 89.9  PLT 496.0* 462* 353 372   Basic Metabolic Panel:  Recent Labs Lab 07/18/17 1241 07/19/17 1132 07/20/17 0542 07/21/17 0623  NA 130* 132* 133* 133*  K 4.2 4.1 4.2 4.0  CL 91* 95* 100* 101  CO2 32 27 26 27   GLUCOSE 105* 101* 82 92  BUN 11 9 8 8   CREATININE 0.57 0.54 0.56 0.51  CALCIUM 10.3 9.6 8.8* 8.7*   GFR: Estimated Creatinine Clearance: 31 mL/min (by C-G formula based on SCr of 0.51 mg/dL). Liver Function Tests: No results for input(s): AST, ALT, ALKPHOS, BILITOT, PROT, ALBUMIN in the last 168 hours. No results for input(s): LIPASE, AMYLASE in the last 168 hours. No results for input(s): AMMONIA in the last 168 hours. Coagulation Profile: No results for input(s): INR, PROTIME in the last 168 hours. Cardiac Enzymes: No results for input(s): CKTOTAL, CKMB, CKMBINDEX, TROPONINI in the last 168 hours. BNP (last 3 results)  Recent Labs  07/18/17 1241  PROBNP 67.0   HbA1C: No results for input(s): HGBA1C in the last 72 hours. CBG:  Recent Labs Lab 07/20/17 2128 07/21/17 0758 07/21/17 1155  GLUCAP 108* 95 94   Lipid Profile: No results for input(s): CHOL, HDL, LDLCALC, TRIG, CHOLHDL, LDLDIRECT in the last  72 hours. Thyroid Function Tests: No results for input(s): TSH, T4TOTAL, FREET4, T3FREE, THYROIDAB in the last 72 hours. Anemia Panel: No results for input(s): VITAMINB12, FOLATE, FERRITIN, TIBC, IRON, RETICCTPCT in the last 72 hours. Urine analysis:    Component Value Date/Time   COLORURINE YELLOW 06/05/2010 1739   APPEARANCEUR CLEAR 06/05/2010 1739   LABSPEC 1.018 06/05/2010 1739   PHURINE 7.0 06/05/2010 1739   GLUCOSEU NEGATIVE 06/05/2010 1739   HGBUR NEGATIVE 06/05/2010 1739   HGBUR negative 03/24/2008 1013   BILIRUBINUR n 02/08/2015 1023   KETONESUR 40 (A) 06/05/2010 1739   PROTEINUR n 02/08/2015 1023   PROTEINUR NEGATIVE 06/05/2010 1739   UROBILINOGEN 0.2 02/08/2015 1023   UROBILINOGEN 0.2 06/05/2010 1739   NITRITE n 02/08/2015 1023   NITRITE NEGATIVE 06/05/2010 1739   LEUKOCYTESUR small (1+) 02/08/2015 1023  Sepsis Labs: @LABRCNTIP (procalcitonin:4,lacticidven:4)  ) Recent Results (from the past 240 hour(s))  Culture, blood (routine x 2) Call MD if unable to obtain prior to antibiotics being given     Status: None (Preliminary result)   Collection Time: 07/19/17  7:14 PM  Result Value Ref Range Status   Specimen Description BLOOD RIGHT ANTECUBITAL  Final   Special Requests   Final    BOTTLES DRAWN AEROBIC AND ANAEROBIC Blood Culture adequate volume   Culture NO GROWTH < 24 HOURS  Final   Report Status PENDING  Incomplete  Culture, blood (routine x 2) Call MD if unable to obtain prior to antibiotics being given     Status: None (Preliminary result)   Collection Time: 07/19/17  7:23 PM  Result Value Ref Range Status   Specimen Description BLOOD LEFT HAND  Final   Special Requests IN PEDIATRIC BOTTLE Blood Culture adequate volume  Final   Culture NO GROWTH < 24 HOURS  Final   Report Status PENDING  Incomplete  Culture, sputum-assessment     Status: None   Collection Time: 07/20/17  1:45 PM  Result Value Ref Range Status   Specimen Description SPUTUM  Final    Special Requests NONE  Final   Sputum evaluation THIS SPECIMEN IS ACCEPTABLE FOR SPUTUM CULTURE  Final   Report Status 07/20/2017 FINAL  Final  Culture, respiratory (NON-Expectorated)     Status: None (Preliminary result)   Collection Time: 07/20/17  1:45 PM  Result Value Ref Range Status   Specimen Description SPUTUM  Final   Special Requests NONE Reflexed from [id number]  Final   Gram Stain   Final    ABUNDANT WBC PRESENT,BOTH PMN AND MONONUCLEAR FEW GRAM POSITIVE COCCI IN PAIRS RARE YEAST    Culture CULTURE REINCUBATED FOR BETTER GROWTH  Final   Report Status PENDING  Incomplete      Radiology Studies: No results found.   Scheduled Meds: . artificial tears   Both Eyes Daily  . calcium-vitamin D  1 tablet Oral Q breakfast  . enoxaparin (LOVENOX) injection  30 mg Subcutaneous Q24H  . escitalopram  5 mg Oral Daily  . mouth rinse  15 mL Mouth Rinse BID  . mirtazapine  15 mg Oral QHS  . mometasone-formoterol  2 puff Inhalation BID  . multivitamin  1 tablet Oral Daily   Continuous Infusions: . levofloxacin (LEVAQUIN) IV       LOS: 1 day   Time Spent in minutes   30 minutes  OSEI-BONSU,Azaylah Stailey D.O. on 07/21/2017 at 2:27 PM  Between 7am to 7pm - Pager - [phone]  After 7pm go to [email or web address] - password [email or web address]  And look for the night coverage person covering for me after hours  [facility] Office  [phone]

## 2017-07-22 MED ORDER — GUAIFENESIN-DM 100-10 MG/5ML PO SYRP: 10.0000 mL | ORAL_SOLUTION | ORAL | 0 refills | Status: DC | PRN

## 2017-07-22 MED ORDER — LEVOFLOXACIN 250 MG PO TABS: 250.0000 mg | ORAL_TABLET | Freq: Every day | ORAL | 0 refills | Status: DC

## 2017-07-22 MED ORDER — LEVOFLOXACIN 500 MG PO TABS: 250.0000 mg | ORAL_TABLET | Freq: Every day | ORAL | Status: DC

## 2017-07-22 NOTE — Progress Notes (Signed)
Sharon AngerJoanne E Caban to be D/C'd Home per MD order.  Discussed prescriptions and follow up appointments with the patient. Prescriptions given to patient, medication list explained in detail. Pt verbalized understanding.  Allergies as of 07/22/2017      Reactions   Penicillins Rash   Has patient had a PCN reaction causing immediate rash, facial/tongue/throat swelling, SOB or lightheadedness with hypotension: Yes Has patient had a PCN reaction causing severe rash involving mucus membranes or skin necrosis: Unknown Has patient had a PCN reaction that required hospitalization: Unknown Has patient had a PCN reaction occurring within the last 10 years: No If all of the above answers are "NO", then may proceed with Cephalosporin use.      Medication List    STOP taking these medications   azithromycin 500 MG tablet Commonly known as:  ZITHROMAX   guaiFENesin 600 MG 12 hr tablet Commonly known as:  MUCINEX     TAKE these medications   ALEVE 220 MG tablet Generic drug:  naproxen sodium Take 220 mg by mouth as needed (pain).   ALPRAZolam 0.25 MG tablet Commonly known as:  XANAX TAKE 1 TABLET BY MOUTH THREE TIMES DAILY AS NEEDED FOR ANXIETY What changed:  See the new instructions.   CALTRATE 600+D 600-400 MG-UNIT chew tablet Generic drug:  Calcium Carbonate-Vitamin D Chew 1 tablet by mouth every morning.   cyanocobalamin 1000 MCG/ML injection Commonly known as:  (VITAMIN B-12) INJECT 1 ML IN THE MUSCLE ONCE MONTHLY   escitalopram 5 MG tablet Commonly known as:  LEXAPRO TAKE 1 TABLET BY MOUTH DAILY What changed:  See the new instructions.   guaiFENesin-dextromethorphan 100-10 MG/5ML syrup Commonly known as:  ROBITUSSIN DM Take 10 mLs by mouth every 4 (four) hours as needed for cough.   ICAPS AREDS 2 Caps Take 1 capsule by mouth daily.   Ipratropium-Albuterol 20-100 MCG/ACT Aers respimat Commonly known as:  COMBIVENT Inhale 1 puff into the lungs every 6 (six) hours as needed for  wheezing or shortness of breath.   levofloxacin 250 MG tablet Commonly known as:  LEVAQUIN Take 1 tablet (250 mg total) by mouth daily.   mirtazapine 15 MG tablet Commonly known as:  REMERON TAKE 1 TABLET BY MOUTH EVERY NIGHT AT BEDTIME What changed:  See the new instructions.   mometasone-formoterol 200-5 MCG/ACT Aero Commonly known as:  DULERA Inhale 2 puffs into the lungs 2 (two) times daily.   OXYGEN Oxygen 3lpm 24/7   PROBIOTIC PO Take 1 tablet by mouth daily.   SYSTANE OP Place 1 drop into both eyes daily.       Vitals:   07/22/17 0440 07/22/17 1006  BP: (!) 145/76 126/74  Pulse: 69 68  Resp: 18 18  Temp: 97.9 F (36.6 C) (!) 97.5 F (36.4 C)    Skin clean, dry and intact without evidence of skin break down, no evidence of skin tears noted. IV catheter discontinued intact. Site without signs and symptoms of complications. Dressing and pressure applied. Pt denies pain at this time. No complaints noted.  An After Visit Summary was printed and given to the patient. Patient escorted via WC, and D/C home via private auto.  Britt BologneseAnisha Mabe RN, BSN

## 2017-07-22 NOTE — Discharge Summary (Signed)
Sharon [name], is a 83 y.o. female  DOB [birth year]  MRN [id number].  Admission date:  07/19/2017  Admitting Physician  Delano Metz, MD  Discharge Date:  07/22/2017   Primary MD  Gordy Savers, MD  Recommendations for primary care physician for things to follow:  CBC and BMP Final blood culture results   Admission Diagnosis  Unilateral emphysema (HCC) [J43.0] Supplemental oxygen dependent [Z99.81] Multifocal pneumonia [J18.9] Atherosclerosis of coronary artery of native heart without angina pectoris, unspecified vessel or lesion type [I25.10]   Discharge Diagnosis  Unilateral emphysema (HCC) [J43.0] Supplemental oxygen dependent [Z99.81] Multifocal pneumonia [J18.9] Atherosclerosis of coronary artery of native heart without angina pectoris, unspecified vessel or lesion type [I25.10]    Principal Problem:   CAP (community acquired pneumonia) Active Problems:   Depression   COPD GOLD III    Cough   Acute respiratory failure (HCC)   Supplemental oxygen dependent      Past Medical History:  Diagnosis Date  . Anemia   . B12 DEFICIENCY 03/31/2008  . COPD 11/27/2008  . ESOPHAGEAL STRICTURE 01/29/2009  . MASS, LUNG 10/13/2009  . NASAL FRACTURE 08/12/2009  . OSTEOARTHRITIS 03/31/2008   Hips  . OSTEOPOROSIS 12/27/2007  . PEDAL EDEMA 12/27/2007  . Pneumonia 07/10/2016  . PULMONARY NODULE 10/21/2009  . RESPIRATORY FAILURE, CHRONIC 06/29/2010  . Shortness of breath dyspnea     Past Surgical History:  Procedure Laterality Date  . APPENDECTOMY     '38  . BALLOON DILATION N/A 05/10/2015   Procedure: BALLOON DILATION;  Surgeon: Louis Meckel, MD;  Location: [facility];  Service: Endoscopy;  Laterality: N/A;  . BALLOON DILATION N/A 08/02/2015   Procedure: BALLOON DILATION;  Surgeon: Louis Meckel, MD;  Location: [facility];  Service: Endoscopy;  Laterality: N/A;  . BREAST SURGERY     biopsy '47  . CATARACT EXTRACTION Bilateral    last '05  . CHOLECYSTECTOMY     laparoscopic'10  . ESOPHAGOGASTRODUODENOSCOPY     with dilatation x3  . ESOPHAGOGASTRODUODENOSCOPY (EGD) WITH PROPOFOL N/A 05/10/2015   Procedure: ESOPHAGOGASTRODUODENOSCOPY (EGD) WITH PROPOFOL;  Surgeon: Louis Meckel, MD;  Location: [facility];  Service: Endoscopy;  Laterality: N/A;  balloon dilation  . ESOPHAGOGASTRODUODENOSCOPY (EGD) WITH PROPOFOL N/A 08/02/2015   Procedure: ESOPHAGOGASTRODUODENOSCOPY (EGD) WITH PROPOFOL;  Surgeon: Louis Meckel, MD;  Location: [facility];  Service: Endoscopy;  Laterality: N/A;  . LUMBAR LAMINECTOMY    . NASAL RECONSTRUCTION WITH SEPTAL REPAIR     '10  . RECTAL PROLAPSE REPAIR     '06  . skin graf     '98       HPI  from the history and physical done on the day of admission:  Sharon [name] is a 83 y.o. female with hx of COPD on home O2 at 3 lpm, chron resp failure, OA, COPD, esophageal stricture w/ chron dysphagia, anemia presented to ED wthi 3 weeks hx of prod cough w greenish sputum, malaise and ^DOE.  Has had two courses of  po abx, first levaquin then azithromycin and didn't get better w/ either.  No sig CP, no fevers/ chills, no abd pain , no n/v/d.   CXR shows old pleural effusions L > R vs scarring at bilat bases.  COPD.  CT angio showed no PE and marked COPD changes.   [place]/ [place] and daughter.  Likes to travel a lot.  No tob/ etoh.   ROS  denies CP  no joint pain   no HA  no blurry vision  no rash  no diarrhea  no nausea/ vomiting  no dysuria  no difficulty voiding  no change in urine color      Hospital Course:  83 year old female with history of COPD on 3 L of home oxygen, esophageal stricture with chronic dysphagia, anemia, presented with cough and shortness of breath for 3 weeks. Patient had 2 courses of antibiotics without improvement. Recently saw her pulmonologist who placed her on another course of azithromycin starting  07/18/2017.  She is admitted for outpatient free treatment, to acquired pneumonia) COPD   Acute on chronic respiratory failure/possible community-acquired pneumonia -Chest x-ray- persistent small left pleural effusion with left base atelectasis, chronic scarring. -CTA chest negative for pulmonary embolism. Multifocal peripheral based patchy airspace opacities in the right upper and middle lobes concerning for multifocal infectious/inflammatory process. -Upon review of patient's records, she recently completed Levaquin seven-day course of the beginning of July 2018. She then completed course of azithromycin. She was thought to have COPD exacerbation. -Currently she is not wheezing.  -Patient presented to her pulmonology office on 07/18/2017, and was given another course of azithromycin. -Currently she is on home oxygen, 3 L -Will continue IV Levaquin (will not switch over to oral at this time given patient has had problems with swallowing- she took a tablet of azithromycin prior to admission, and had problems swallowing the pill)  -Will change oral tab muciex changed to  Mucinex syrup -Continue nebulizer treatments, dulera - incentive spirometry and flutter valve  -sputum culture if possible  Advanced COPD/chronic respiratory failure -No wheezing -  Steroid not given -Continue neb treatments as needed  Depression/anxiety -Continue Lexapro, Xanax as needed   Insomnia -Continue Remeron  Normocytic anemia -Baseline hemoglobin approximately 11, currently 9.3 -Suspect drop in hemoglobin secondary to dilutional component as patient was receiving IV fluids  History of esophageal stricture with chronic dysphagia -Will continue to monitor -If necessary, will place patient on dysphagia diet     Discharge Condition: Stable and satisfactory  Follow UP -PCP in 1-2 weeks     Consults obtained - NONE  Diet and Activity recommendation:  As advised  Discharge Instructions      Discharge Instructions    Call MD for:  difficulty breathing, headache or visual disturbances    Complete by:  As directed    Call MD for:  extreme fatigue    Complete by:  As directed    Call MD for:  hives    Complete by:  As directed    Call MD for:  persistant dizziness or light-headedness    Complete by:  As directed    Call MD for:  persistant nausea and vomiting    Complete by:  As directed    Call MD for:  redness, tenderness, or signs of infection (pain, swelling, redness, odor or green/yellow discharge around incision site)    Complete by:  As directed    Call MD for:  severe uncontrolled pain  Complete by:  As directed    Call MD for:  temperature >100.4    Complete by:  As directed    Diet - low sodium heart healthy    Complete by:  As directed    Increase activity slowly    Complete by:  As directed         Discharge Medications     Allergies as of 07/22/2017      Reactions   Penicillins Rash   Has patient had a PCN reaction causing immediate rash, facial/tongue/throat swelling, SOB or lightheadedness with hypotension: Yes Has patient had a PCN reaction causing severe rash involving mucus membranes or skin necrosis: Unknown Has patient had a PCN reaction that required hospitalization: Unknown Has patient had a PCN reaction occurring within the last 10 years: No If all of the above answers are "NO", then may proceed with Cephalosporin use.      Medication List    STOP taking these medications   azithromycin 500 MG tablet Commonly known as:  ZITHROMAX   guaiFENesin 600 MG 12 hr tablet Commonly known as:  MUCINEX     TAKE these medications   ALEVE 220 MG tablet Generic drug:  naproxen sodium Take 220 mg by mouth as needed (pain).   ALPRAZolam 0.25 MG tablet Commonly known as:  XANAX TAKE 1 TABLET BY MOUTH THREE TIMES DAILY AS NEEDED FOR ANXIETY What changed:  See the new instructions.   CALTRATE 600+D 600-400 MG-UNIT chew tablet Generic drug:   Calcium Carbonate-Vitamin D Chew 1 tablet by mouth every morning.   cyanocobalamin 1000 MCG/ML injection Commonly known as:  (VITAMIN B-12) INJECT 1 ML IN THE MUSCLE ONCE MONTHLY   escitalopram 5 MG tablet Commonly known as:  LEXAPRO TAKE 1 TABLET BY MOUTH DAILY What changed:  See the new instructions.   guaiFENesin-dextromethorphan 100-10 MG/5ML syrup Commonly known as:  ROBITUSSIN DM Take 10 mLs by mouth every 4 (four) hours as needed for cough.   ICAPS AREDS 2 Caps Take 1 capsule by mouth daily.   Ipratropium-Albuterol 20-100 MCG/ACT Aers respimat Commonly known as:  COMBIVENT Inhale 1 puff into the lungs every 6 (six) hours as needed for wheezing or shortness of breath.   levofloxacin 250 MG tablet Commonly known as:  LEVAQUIN Take 1 tablet (250 mg total) by mouth daily.   mirtazapine 15 MG tablet Commonly known as:  REMERON TAKE 1 TABLET BY MOUTH EVERY NIGHT AT BEDTIME What changed:  See the new instructions.   mometasone-formoterol 200-5 MCG/ACT Aero Commonly known as:  DULERA Inhale 2 puffs into the lungs 2 (two) times daily.   OXYGEN Oxygen 3lpm 24/7   PROBIOTIC PO Take 1 tablet by mouth daily.   SYSTANE OP Place 1 drop into both eyes daily.       Major procedures and Radiology Reports   Dg Chest 2 View  Result Date: 07/19/2017 CLINICAL DATA:  Shortness of breath and cough EXAM: CHEST  2 VIEW COMPARISON:  July 16, 2017 and October 13, 2016 FINDINGS: There is a persistent fairly small left pleural effusion with left base atelectasis. There is mild chronic scarring in the right base. There is chronic interstitial thickening bilaterally, primarily in the upper lobes, stable. There is no frank edema or consolidation. The heart size and pulmonary vascularity are within normal limits. No evident adenopathy. There is aortic atherosclerosis. There is postoperative change in the upper lumbar region. IMPRESSION: Persistent small left pleural effusion with left base  atelectasis. Areas of chronic  scarring and fibrotic type change. No frank edema or consolidation noted. It should be noted that mild interstitial edema could be obscured by the overall interstitial prominence elsewhere. Stable cardiac silhouette.  There is aortic atherosclerosis. Aortic Atherosclerosis (ICD10-I70.0). Electronically Signed   By: Bretta Bang III M.D.   On: 07/19/2017 12:07   Dg Chest 2 View  Result Date: 07/16/2017 CLINICAL DATA:  83 year old female with increase shortness of breath for 3 weeks. Cough and congestion. EXAM: CHEST  2 VIEW COMPARISON:  10/13/2016 and earlier. FINDINGS: Chronic large lung volumes with coarse interstitial pulmonary opacity. Emphysema and bronchiectasis demonstrated by chest CT on 10/13/2009. Small layering left pleural effusion is new or increased since 2017. Right lung base appears stable. No other acute pulmonary opacity identified. Stable cardiac size and mediastinal contours. Calcified aortic atherosclerosis. Osteopenia. Lumbar fusion hardware is partially visible. No acute osseous abnormality identified. Negative visible bowel gas pattern. IMPRESSION: 1. Small left pleural effusion is new or increased since 2017. 2. Underlying chronic lung disease with emphysema and Bronchiectasis. 3. Aortic Atherosclerosis (ICD10-I70.0) and Emphysema (ICD10-J43.9). Electronically Signed   By: Odessa Fleming M.D.   On: 07/16/2017 15:22   Ct Angio Chest Pe W And/or Wo Contrast  Result Date: 07/19/2017 CLINICAL DATA:  82 year old female with central chest pain, productive cough and elevated D-dimer EXAM: CT ANGIOGRAPHY CHEST WITH CONTRAST TECHNIQUE: Multidetector CT imaging of the chest was performed using the standard protocol during bolus administration of intravenous contrast. Multiplanar CT image reconstructions and MIPs were obtained to evaluate the vascular anatomy. CONTRAST:  100 mL Isovue 370 COMPARISON:  Prior chest x-ray obtained earlier today FINDINGS: Cardiovascular:  Excellent opacification of the pulmonary arteries to the proximal subsegmental level. No evidence of acute pulmonary embolus. Mild cardiomegaly. Trace pericardial fluid versus thickening. Atherosclerotic calcifications noted along the course of the left anterior descending coronary artery. Calcifications are also present throughout the thoracic aorta. Mediastinum/Nodes: Unremarkable CT appearance of the thyroid gland. No suspicious mediastinal or hilar adenopathy. No soft tissue mediastinal mass. The thoracic esophagus is unremarkable. Lungs/Pleura: Small left pleural effusion. Advanced centrilobular pulmonary emphysema. Respiratory motion artifact slightly limits evaluation for small pulmonary nodules. Multifocal regions of peripheral nodular opacity in the right upper lobe. The largest measures 1.3 cm in diameter on image 24 of series 9. Additional scattered benign calcified granulomas are also present. Patchy airspace opacity is present in the periphery of the right middle lobe. Diffuse mild bronchial wall thickening. Atelectasis and/ or consolidation in the central aspect of the inferior left lower lobe with multiple small air bronchograms. This appears to be secondary to a focal obstruction of the left lower lobe bronchus (image 47 of series 9). 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 Previously this measured 3.1 by 2.1 cm. 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 No acute abnormality. Musculoskeletal: No acute osseous abnormality. Advanced lumbar degenerative disc disease is incompletely imaged. Review of the MIP images confirms the above findings. IMPRESSION: 1. Negative for acute pulmonary embolus. 2. Multifocal peripherally based patchy airspace opacities noted in the right upper and right middle lobe concerning for a multifocal infectious/inflammatory process.  3. Advanced centrilobular pulmonary emphysema. 4. Chronic atelectasis and/or scarring in the medial aspect of the left lower lobe secondary to partial obstruction of the left lower lobe bronchus. A small amount of superimposed infectious infiltrate is difficult to exclude. This may be secondary to  inspissated secretions, scarring from prior infection, or possibly an endobronchial neoplasm. 5. Coronary artery calcifications. 6. Small layering left pleural effusion may be secondary to the likely chronic volume loss in the left lower lobe. 7. 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 8. Additional ancillary findings as above without significant interval change. Aortic Atherosclerosis (ICD10-I70.0) and Emphysema (ICD10-J43.9). Electronically Signed   By: Malachy Moan M.D.   On: 07/19/2017 14:26    Micro Results    Recent Results (from the past 240 hour(s))  Culture, blood (routine x 2) Call MD if unable to obtain prior to antibiotics being given     Status: None (Preliminary result)   Collection Time: 07/19/17  7:14 PM  Result Value Ref Range Status   Specimen Description BLOOD RIGHT ANTECUBITAL  Final   Special Requests   Final    BOTTLES DRAWN AEROBIC AND ANAEROBIC Blood Culture adequate volume   Culture NO GROWTH 3 DAYS  Final   Report Status PENDING  Incomplete  Culture, blood (routine x 2) Call MD if unable to obtain prior to antibiotics being given     Status: None (Preliminary result)   Collection Time: 07/19/17  7:23 PM  Result Value Ref Range Status   Specimen Description BLOOD LEFT HAND  Final   Special Requests IN PEDIATRIC BOTTLE Blood Culture adequate volume  Final   Culture NO GROWTH 3 DAYS  Final   Report Status PENDING  Incomplete  Culture, sputum-assessment     Status: None   Collection Time: 07/20/17  1:45 PM  Result Value Ref Range Status   Specimen Description SPUTUM  Final   Special Requests NONE  Final   Sputum  evaluation THIS SPECIMEN IS ACCEPTABLE FOR SPUTUM CULTURE  Final   Report Status 07/20/2017 FINAL  Final  Culture, respiratory (NON-Expectorated)     Status: None (Preliminary result)   Collection Time: 07/20/17  1:45 PM  Result Value Ref Range Status   Specimen Description SPUTUM  Final   Special Requests NONE Reflexed from [id number]  Final   Gram Stain   Final    ABUNDANT WBC PRESENT,BOTH PMN AND MONONUCLEAR FEW GRAM POSITIVE COCCI IN PAIRS RARE YEAST    Culture CULTURE REINCUBATED FOR BETTER GROWTH  Final   Report Status PENDING  Incomplete       Today   Subjective    Mirna Sutcliffe today has no Fever or chills. No nausea vomiting. Shortness of breath is a lot better. Her cough is very well controlled as moment. She wants to go home. Some appetite at bedside.         Patient has been seen and examined prior to discharge   Objective   Blood pressure 126/74, pulse 68, temperature (!) 97.5 F (36.4 C), temperature source Oral, resp. rate 18, height 5\' 5"  (1.651 m), weight 38.1 kg (84 lb), SpO2 100 %.   Intake/Output Summary (Last 24 hours) at 07/22/17 1440 Last data filed at 07/22/17 0949  Gross per 24 hour  Intake              840 ml  Output             1200 ml  Net             -360 ml    Exam  General: Resting comfortably, NAD  HEENT: NCAT, mucous membranes moist.   Cardiovascular: RRR,  no rubs, murmurs or gallops.  Respiratory: Diminished breath sounds, no wheezing  Abdomen: Soft, nontender, nondistended, + bowel sounds  Extremities: no cyanosis clubbing or edema  Neuro: AAOx3, nonfocal       Psych: Normal affect      Data Review   CBC w Diff:  Lab Results  Component Value Date   WBC 8.6 07/21/2017   HGB 9.6 (L) 07/21/2017   HCT 28.5 (L) 07/21/2017   PLT 372 07/21/2017   LYMPHOPCT 13.5 07/18/2017   MONOPCT 10.3 07/18/2017   EOSPCT 3.0 07/18/2017   BASOPCT 0.5 07/18/2017    CMP:  Lab Results  Component Value Date   NA 133 (L) 07/21/2017    K 4.0 07/21/2017   CL 101 07/21/2017   CO2 27 07/21/2017   BUN 8 07/21/2017   CREATININE 0.51 07/21/2017   PROT 7.2 07/28/2016   ALBUMIN 3.4 (L) 07/28/2016   BILITOT 0.4 07/28/2016   ALKPHOS 70 07/28/2016   AST 18 07/28/2016   ALT 19 07/28/2016  .   Total Discharge time is about 33 minutes  OSEI-BONSU,Magalene Mclear M.D on 07/22/2017 at 2:40 PM  [facility]   Office  [phone]  Dragon dictation system was used to create this note, attempts have been made to correct errors, however presence of uncorrected errors is not a reflection quality of care provided

## 2017-07-22 NOTE — Progress Notes (Signed)
Family and patient are becoming anxious and wanting to know what time the patient will be D/C'd. I explained that once the MD places the D/C order, then I would print out their paperwork and let them know. Family is upset and requesting to be D/C'd. MD notified. MD called and requested to speak with the family. Son was given the phone to speak with the MD. I explained to the family once again that once the D/C order was in that I would gather her paperwork and they would be D/C'd.

## 2017-07-22 NOTE — Progress Notes (Signed)
[facility] states that the D/C order reconciliation is incomplete. MD notified.

## 2017-07-23 ENCOUNTER — Ambulatory Visit: Payer: Federal, State, Local not specified - PPO | Admitting: Adult Health

## 2017-07-23 LAB — CULTURE, RESPIRATORY W GRAM STAIN

## 2017-07-23 LAB — CULTURE, RESPIRATORY

## 2017-07-24 LAB — CULTURE, BLOOD (ROUTINE X 2)
CULTURE: NO GROWTH
Culture: NO GROWTH
Special Requests: ADEQUATE
Special Requests: ADEQUATE

## 2017-08-04 ENCOUNTER — Other Ambulatory Visit: Payer: Self-pay | Admitting: Internal Medicine

## 2017-08-17 ENCOUNTER — Telehealth: Payer: Self-pay | Admitting: Internal Medicine

## 2017-08-17 MED ORDER — LEVOFLOXACIN 250 MG PO TABS: 250.0000 mg | ORAL_TABLET | Freq: Every day | ORAL | 0 refills | Status: DC

## 2017-08-17 NOTE — Telephone Encounter (Signed)
° ° ° ° °  Pt daughter in law call to ask if a antibiotic can be called in for pt. [place] and she has developed a cough with phelm. Daughter in law would like to have what pt had last time for an antibiotic    She would a call back   [phone]

## 2017-08-17 NOTE — Telephone Encounter (Signed)
Spoke to pt's daughter in law and informed her that Levaquin 250 mg PO QD #10 was called in to [facility] [address] [place] as requested by pt.

## 2017-08-17 NOTE — Telephone Encounter (Signed)
Please advise 

## 2017-08-17 NOTE — Telephone Encounter (Signed)
Levaquin 250 mg #10 one tablet daily.  Please let patient/family aware

## 2017-09-25 ENCOUNTER — Encounter: Payer: Self-pay | Admitting: Internal Medicine

## 2017-09-25 ENCOUNTER — Ambulatory Visit (INDEPENDENT_AMBULATORY_CARE_PROVIDER_SITE_OTHER): Payer: Federal, State, Local not specified - PPO | Admitting: Internal Medicine

## 2017-09-25 VITALS — BP 138/70 | HR 84 | Temp 97.6°F | Ht 65.0 in | Wt 96.4 lb

## 2017-09-25 DIAGNOSIS — Z9981 Dependence on supplemental oxygen: Secondary | ICD-10-CM

## 2017-09-25 DIAGNOSIS — J479 Bronchiectasis, uncomplicated: Secondary | ICD-10-CM

## 2017-09-25 DIAGNOSIS — I2510 Atherosclerotic heart disease of native coronary artery without angina pectoris: Secondary | ICD-10-CM

## 2017-09-25 DIAGNOSIS — Z23 Encounter for immunization: Secondary | ICD-10-CM

## 2017-09-25 DIAGNOSIS — J449 Chronic obstructive pulmonary disease, unspecified: Secondary | ICD-10-CM

## 2017-09-25 NOTE — Patient Instructions (Signed)
Return in 6 months for follow-up  Please report any new or worsening symptoms  No change in your current medical regimen

## 2017-09-25 NOTE — Progress Notes (Signed)
Subjective:    Patient ID: Sharon [name], female    DOB: October 18, 1923, 83 y.o.   MRN: [id number]  HPI  83 year old patient who is seen today for follow-up of advanced oxygen-dependent COPD. She was hospitalized 2 months ago for community acquired pneumonia. She has done reasonably well but does have chronic weakness and dyspnea.  She also has chronic sputum production.  Yielding green sputum.  She does have a history of bronchiectasis She has been compliant with maintenance pulmonary medications She has been remarkably active and has spent as much is 6 weeks out of town visiting.  She plans a trip to [place] in December  Past Medical History:  Diagnosis Date  . Anemia   . B12 DEFICIENCY 03/31/2008  . COPD 11/27/2008  . ESOPHAGEAL STRICTURE 01/29/2009  . MASS, LUNG 10/13/2009  . NASAL FRACTURE 08/12/2009  . OSTEOARTHRITIS 03/31/2008   Hips  . OSTEOPOROSIS 12/27/2007  . PEDAL EDEMA 12/27/2007  . Pneumonia 07/10/2016  . PULMONARY NODULE 10/21/2009  . RESPIRATORY FAILURE, CHRONIC 06/29/2010  . Shortness of breath dyspnea      Social History   Social History  . Marital status: Widowed    Spouse name: N/A  . Number of children: N/A  . Years of education: N/A   Occupational History  . Not on file.   Social History Main Topics  . Smoking status: Former Smoker    Packs/day: 0.50    Years: 35.00    Quit date: 12/18/1978  . Smokeless tobacco: Never Used  . Alcohol use 0.0 oz/week     Comment: couple of gin and tonics daily  . Drug use: No  . Sexual activity: Not on file   Other Topics Concern  . Not on file   Social History Narrative  . No narrative on file    Past Surgical History:  Procedure Laterality Date  . APPENDECTOMY     '38  . BALLOON DILATION N/A 05/10/2015   Procedure: BALLOON DILATION;  Surgeon: Louis Meckel, MD;  Location: [facility];  Service: Endoscopy;  Laterality: N/A;  . BALLOON DILATION N/A 08/02/2015   Procedure: BALLOON DILATION;  Surgeon: Louis Meckel, MD;  Location: [facility];  Service: Endoscopy;  Laterality: N/A;  . BREAST SURGERY     biopsy '47  . CATARACT EXTRACTION Bilateral    last '05  . CHOLECYSTECTOMY     laparoscopic'10  . ESOPHAGOGASTRODUODENOSCOPY     with dilatation x3  . ESOPHAGOGASTRODUODENOSCOPY (EGD) WITH PROPOFOL N/A 05/10/2015   Procedure: ESOPHAGOGASTRODUODENOSCOPY (EGD) WITH PROPOFOL;  Surgeon: Louis Meckel, MD;  Location: [facility];  Service: Endoscopy;  Laterality: N/A;  balloon dilation  . ESOPHAGOGASTRODUODENOSCOPY (EGD) WITH PROPOFOL N/A 08/02/2015   Procedure: ESOPHAGOGASTRODUODENOSCOPY (EGD) WITH PROPOFOL;  Surgeon: Louis Meckel, MD;  Location: [facility];  Service: Endoscopy;  Laterality: N/A;  . LUMBAR LAMINECTOMY    . NASAL RECONSTRUCTION WITH SEPTAL REPAIR     '10  . RECTAL PROLAPSE REPAIR     '06  . skin graf     '98    Family History  Problem Relation Age of Onset  . Hypertension Son   . Asthma Mother   . Abdominal Wall Hernia Mother   . Stomach cancer Mother     Allergies  Allergen Reactions  . Penicillins Rash    Has patient had a PCN reaction causing immediate rash, facial/tongue/throat swelling, SOB or lightheadedness with hypotension: Yes Has patient had a PCN reaction causing severe rash involving mucus  membranes or skin necrosis: Unknown Has patient had a PCN reaction that required hospitalization: Unknown Has patient had a PCN reaction occurring within the last 10 years: No If all of the above answers are "NO", then may proceed with Cephalosporin use.     Current Outpatient Prescriptions on File Prior to Visit  Medication Sig Dispense Refill  . ALPRAZolam (XANAX) 0.25 MG tablet TAKE 1 TABLET BY MOUTH THREE TIMES DAILY AS NEEDED FOR ANXIETY (Patient taking differently: TAKE 0.25MG  BY MOUTH THREE TIMES DAILY AS NEEDED FOR ANXIETY) 90 tablet 0  . Calcium Carbonate-Vitamin D (CALTRATE 600+D) 600-400 MG-UNIT per chew tablet Chew 1 tablet by mouth every morning.      . cyanocobalamin (,VITAMIN B-12,) 1000 MCG/ML injection INJECT 1 ML IN THE MUSCLE ONCE MONTHLY 10 mL 1  . guaiFENesin-dextromethorphan (ROBITUSSIN DM) 100-10 MG/5ML syrup Take 10 mLs by mouth every 4 (four) hours as needed for cough. 118 mL 0  . Ipratropium-Albuterol (COMBIVENT) 20-100 MCG/ACT AERS respimat Inhale 1 puff into the lungs every 6 (six) hours as needed for wheezing or shortness of breath. 1 Inhaler 5  . mirtazapine (REMERON) 15 MG tablet TAKE 1 TABLET BY MOUTH EVERY NIGHT AT BEDTIME 90 tablet 0  . mometasone-formoterol (DULERA) 200-5 MCG/ACT AERO Inhale 2 puffs into the lungs 2 (two) times daily. 1 Inhaler 11  . Multiple Vitamins-Minerals (ICAPS AREDS 2) CAPS Take 1 capsule by mouth daily.     . OXYGEN Oxygen 3lpm 24/7    . Polyethyl Glycol-Propyl Glycol (SYSTANE OP) Place 1 drop into both eyes daily.     . Probiotic Product (PROBIOTIC PO) Take 1 tablet by mouth daily.     No current facility-administered medications on file prior to visit.     BP 138/70 (BP Location: Left Arm, Patient Position: Sitting, Cuff Size: Normal)   Pulse 84   Temp 97.6 F (36.4 C) (Oral)   Ht  (1.651 m)   Wt 96 lb 6.4 oz (43.7 kg)   SpO2 90%   BMI 16.04 kg/m     Review of Systems  Constitutional: Positive for fatigue.  HENT: Negative for congestion, dental problem, hearing loss, rhinorrhea, sinus pressure, sore throat and tinnitus.   Eyes: Negative for pain, discharge and visual disturbance.  Respiratory: Positive for cough, choking and shortness of breath.   Cardiovascular: Negative for chest pain, palpitations and leg swelling.       Chronic swelling dorsum left foot  Gastrointestinal: Negative for abdominal distention, abdominal pain, blood in stool, constipation, diarrhea, nausea and vomiting.  Genitourinary: Negative for difficulty urinating, dysuria, flank pain, frequency, hematuria, pelvic pain, urgency, vaginal bleeding, vaginal discharge and vaginal pain.  Musculoskeletal:  Negative for arthralgias, gait problem and joint swelling.  Skin: Negative for rash.  Neurological: Positive for weakness. Negative for dizziness, syncope, speech difficulty, numbness and headaches.  Hematological: Negative for adenopathy.  Psychiatric/Behavioral: Negative for agitation, behavioral problems and dysphoric mood. The patient is not nervous/anxious.        Objective:   Physical Exam  Constitutional: She is oriented to person, place, and time. She appears well-developed and well-nourished. No distress.  Thin Undernourished Nasal cannula O2 in place Alert and appropriate No distress O2 sat ration 90% on supplemental O2  HENT:  Head: Normocephalic.  Right Ear: External ear normal.  Left Ear: External ear normal.  Mouth/Throat: Oropharynx is clear and moist.  Eyes: Pupils are equal, round, and reactive to light. Conjunctivae and EOM are normal.  Neck: Normal range of motion.  Neck supple. No thyromegaly present.  Cardiovascular: Normal rate, regular rhythm, normal heart sounds and intact distal pulses.   Pulmonary/Chest: Effort normal.  Scattered coarse rhonchi more on the left.  No active wheezing.  No increased work of breathing  Abdominal: Soft. Bowel sounds are normal. She exhibits no mass. There is no tenderness.  Musculoskeletal: Normal range of motion. She exhibits edema.  Slight edema involving the dorsal aspect of the left foot  Lymphadenopathy:    She has no cervical adenopathy.  Neurological: She is alert and oriented to person, place, and time.  Skin: Skin is warm and dry. No rash noted.  Psychiatric: She has a normal mood and affect. Her behavior is normal.          Assessment & Plan:  COPD Gold III Chronic respiratory failure.  Continue chronic O2 History a community-acquired pneumonia History of bronchiectasis  High-dose flu vaccine administered Follow-up 6 months or as needed  NIKE

## 2017-10-08 ENCOUNTER — Other Ambulatory Visit: Payer: Self-pay | Admitting: Internal Medicine

## 2017-10-09 ENCOUNTER — Other Ambulatory Visit: Payer: Self-pay | Admitting: Internal Medicine

## 2017-10-25 ENCOUNTER — Telehealth: Payer: Self-pay | Admitting: Internal Medicine

## 2017-10-25 NOTE — Telephone Encounter (Signed)
Form dropped off to be filled out-- Medical certificate for Portable Oxygen use.  Call [phone] upon completion.

## 2017-10-29 ENCOUNTER — Other Ambulatory Visit: Payer: Self-pay | Admitting: Internal Medicine

## 2017-10-29 NOTE — Telephone Encounter (Signed)
Called pt t o inform that pt's Medical certificate for Portable Oxygen is ready to be picked up.

## 2017-11-03 ENCOUNTER — Other Ambulatory Visit: Payer: Self-pay | Admitting: Internal Medicine

## 2017-11-15 ENCOUNTER — Ambulatory Visit: Payer: Federal, State, Local not specified - PPO | Admitting: Internal Medicine

## 2017-11-19 ENCOUNTER — Other Ambulatory Visit: Payer: Self-pay | Admitting: Internal Medicine

## 2017-12-03 ENCOUNTER — Other Ambulatory Visit: Payer: Self-pay | Admitting: Internal Medicine

## 2017-12-10 ENCOUNTER — Other Ambulatory Visit: Payer: Self-pay

## 2017-12-10 ENCOUNTER — Telehealth: Payer: Self-pay | Admitting: Internal Medicine

## 2017-12-10 MED ORDER — MIRTAZAPINE 15 MG PO TABS: 15.0000 mg | ORAL_TABLET | Freq: Every day | ORAL | 0 refills | Status: DC

## 2017-12-10 NOTE — Telephone Encounter (Signed)
Copied from CRM [id number]. Topic: Inquiry >> Dec 10, 2017 11:48 AM [name], [name] wrote: Reason for CRM: Patient's Daughter in [place] [phone]) called requesting an Urgent Refill of Mirtazapine (REMERON) 15 MG tablet. Nicholos JohnsKathleen states that the patient accidentally disposed of all 30 tablets. Their family is in [place] on vacation for Christmas. Nicholos JohnsKathleen has [facility] [address], [place], [place] [id number]  Phn: [phone] ASAP Today

## 2017-12-10 NOTE — Telephone Encounter (Signed)
Rx sent to pharmacy as requested. Nothing further needed.

## 2017-12-10 NOTE — Telephone Encounter (Signed)
LOV 09/25/17 with Dr. Amador CunasKwiatkowski / Please see request below

## 2017-12-24 ENCOUNTER — Encounter: Payer: Self-pay | Admitting: Internal Medicine

## 2017-12-24 ENCOUNTER — Ambulatory Visit (INDEPENDENT_AMBULATORY_CARE_PROVIDER_SITE_OTHER)
Admission: RE | Admit: 2017-12-24 | Discharge: 2017-12-24 | Disposition: A | Discharge status: Home / Self Care | Payer: Federal, State, Local not specified - PPO | Source: Ambulatory Visit | Attending: Internal Medicine | Admitting: Internal Medicine

## 2017-12-24 ENCOUNTER — Ambulatory Visit: Payer: Federal, State, Local not specified - PPO | Admitting: Internal Medicine

## 2017-12-24 VITALS — BP 112/60 | HR 68 | Ht 65.0 in | Wt 96.6 lb

## 2017-12-24 DIAGNOSIS — J449 Chronic obstructive pulmonary disease, unspecified: Secondary | ICD-10-CM

## 2017-12-24 DIAGNOSIS — J9611 Chronic respiratory failure with hypoxia: Secondary | ICD-10-CM | POA: Diagnosis not present

## 2017-12-24 MED ORDER — PREDNISONE 10 MG PO TABS: ORAL_TABLET | ORAL | 0 refills | Status: DC

## 2017-12-24 MED ORDER — FAMOTIDINE 20 MG PO TABS: ORAL_TABLET | ORAL | 2 refills | Status: DC

## 2017-12-24 MED ORDER — MOMETASONE FURO-FORMOTEROL FUM 200-5 MCG/ACT IN AERO: 2.0000 | INHALATION_SPRAY | Freq: Two times a day (BID) | RESPIRATORY_TRACT | 0 refills | Status: DC

## 2017-12-24 MED ORDER — PANTOPRAZOLE SODIUM 40 MG PO TBEC: 40.0000 mg | DELAYED_RELEASE_TABLET | Freq: Every day | ORAL | 2 refills | Status: DC

## 2017-12-24 NOTE — Progress Notes (Signed)
Subjective:    Patient ID: Sharon [name], female    DOB: 1923/11/05,    MRN: [id number]   Brief patient profile:  89  yowf quit smoking in 1980 with no minimal chronic doe assoc mild chronic nodular infiltrates dating back to 11/23/2008 with documented GOLD III criteria copd/ bronchiectasis by CT 09/2009 both lower lobes       Admit date: 07/09/2016 Discharge date: 07/12/2016  Discharge Diagnoses:  Principal Problem:   CAP (community acquired pneumonia)   Depression   Community acquired pneumonia   COPD (chronic obstructive pulmonary disease) with emphysema (HCC)   Sepsis (HCC)   Acute respiratory failure with hypoxia (HCC)   Hyponatremia      08/16/2016 1st [facility] office visit/Epic era/ Keeva Reisen  Re s/p CAP in pt with bronchiectasis/ GOLD III COPD  Chief Complaint  Patient presents with  . Pulmonary Consult    Referred by Dr. Eleonore ChiquitoPeter Kwiatkowski for pleural effusion. Pt states that she has been dxed with PNA x 2 since July 2017. She has had cough and SOB. Cough is currently non prod.   baseline = MMRC2 = can't walk a nl pace on a flat grade s sob but does fine slow and flat eg [employer] shopping/ uses [facility] parking on [address] 2bid and rare need for combivent  Acutely ill with hoarseness in [place] p getting off plan from [place] then dry cough and admit: Now on 02 at 3lpm 24/7 rec Please see patient coordinator before you leave today  to schedule venous doppler asap on Left> neg  Left  thoracentesis 08/17/16 =  1 liter on Left  WBC 1,020  L > P  Transudate with nl glucose/ cyt neg       12/24/2017  Extended acute  ov/Charlesa Ehle re: re-establish   COPD GOLD III with bronchiectasis  And prior tranusdative efffuson  Chief Complaint  Patient presents with  . Acute Visit    SOB with exertion, non productive cough more in the morning and with talking,nasal congestion,left foot swelling  solid food / pill dysphagia Sleeps left side down horizontally  Worse doe x 07/2017 rx   dulera 200 2bid helped some but still needing combivent maybe once or whice a day but not noct  Doe now = MMRC3 = can't walk 100 yards even at a slow pace at a flat grade s stopping due to sob  ? On approp 02?    No obvious day to day or daytime variability or assoc excess/ purulent sputum or mucus plugs or hemoptysis or cp or chest tightness, subjective wheeze or overt sinus or hb symptoms. No unusual exposure hx or h/o childhood pna/ asthma or knowledge of premature birth.  Sleeping ok flat without nocturnal  or early am exacerbation  of respiratory  c/o's or need for noct saba. Also denies any obvious fluctuation of symptoms with weather or environmental changes or other aggravating or alleviating factors except as outlined above   Current Allergies, Complete Past Medical History, Past Surgical History, Family History, and Social History were reviewed in [facility] electronic medical record.  ROS  The following are not active complaints unless bolded Hoarseness, sore throat, dysphagia, dental problems, itching, sneezing,  nasal congestion or discharge of excess mucus or purulent secretions, ear ache,   fever, chills, sweats, unintended wt loss or wt gain, classically pleuritic or exertional cp,  orthopnea pnd or leg swelling, presyncope, palpitations, abdominal pain, anorexia, nausea, vomiting, diarrhea  or change in bowel habits or  change in bladder habits, change in stools or change in urine, dysuria, hematuria,  rash, arthralgias, visual complaints, headache, numbness, weakness or ataxia or problems with walking or coordination,  change in mood/affect or memory.        Current Meds  Medication Sig  . ALPRAZolam (XANAX) 0.25 MG tablet TAKE 1 TABLET BY MOUTH THREE TIMES DAILY AS NEEDED FOR ANXIETY.  . Calcium Carbonate-Vitamin D (CALTRATE 600+D) 600-400 MG-UNIT per chew tablet Chew 1 tablet by mouth every morning.   . cyanocobalamin (,VITAMIN B-12,) 1000 MCG/ML injection INJECT 1 ML INTO  THE MUSCLE ONCE MONTHLY  . escitalopram (LEXAPRO) 5 MG tablet TAKE 1 TABLET BY MOUTH DAILY  . Ipratropium-Albuterol (COMBIVENT) 20-100 MCG/ACT AERS respimat Inhale 1 puff into the lungs every 6 (six) hours as needed for wheezing or shortness of breath.  . mirtazapine (REMERON) 15 MG tablet Take 1 tablet (15 mg total) by mouth at bedtime.  . mometasone-formoterol (DULERA) 200-5 MCG/ACT AERO Inhale 2 puffs into the lungs 2 (two) times daily.  . Multiple Vitamins-Minerals (ICAPS AREDS 2) CAPS Take 1 capsule by mouth daily.   . OXYGEN Oxygen 3lpm 24/7  . Polyethyl Glycol-Propyl Glycol (SYSTANE OP) Place 1 drop into both eyes daily.   . [DISCONTINUED] guaiFENesin-dextromethorphan (ROBITUSSIN DM) 100-10 MG/5ML syrup Take 10 mLs by mouth every 4 (four) hours as needed for cough.               Past Medical History: Osteoporosis B12 deficiency Osteoarthritis COPD    - PFT's December 03, 2009 FEV1 67 (40%) with 12% response to B2 and DLC0 55%    - HFA 25% June 29, 2010 > 75% August 11, 2010 > 75% November 03, 2010  Fx Right Hip and required ORIF June 19th 2011> 02 dependent since > d/c November 03, 2010  history of esophageal stricture...........................................Marland KitchenPatterson Multiple pulmonary nodules..................................................Marland KitchenWert    -CT chest 10/13/09 assoc with bilateral lower lobe bronchiectasis    -CXR December 03, 2009 no change  > no change June 29, 2010            Objective:   Physical Exam   amb hoarse elderly wf nad   12/24/2017           96   08/16/16 102 lb (46.3 kg)  08/15/16 101 lb 4 oz (45.9 kg)  07/28/16 99 lb 12.8 oz (45.3 kg)    Vital signs reviewed - Note on arrival 02 sats  97% on 2lpm pulsed       HEENT: nl dentition, turbinates bilaterally, and oropharynx. Nl external ear canals without cough reflex   NECK :  without JVD/Nodes/TM/ nl carotid upstrokes bilaterally   LUNGS: no acc muscle use,  Nl contour chest with  distant bs bilaterally s wheeze or dullness at bases    CV:  RRR  no s3 or murmur or increase in P2, and no edema   ABD:  soft and nontender with nl inspiratory excursion in the supine position. No bruits or organomegaly appreciated, bowel sounds nl  MS:  Nl gait/ ext warm without deformities, calf tenderness, cyanosis or clubbing No obvious joint restrictions   SKIN: warm and dry without lesions    NEURO:  alert, approp, nl sensorium with  no motor or cerebellar deficits apparent.              CXR PA and Lateral:   12/24/2017 :    I personally reviewed images and agree with radiology impression as follows:  Severe emphysema without acute disease.              Assessment & Plan:

## 2017-12-24 NOTE — Patient Instructions (Addendum)
Work on inhaler technique:  relax and gently blow all the way out then take a nice smooth deep breath back in, triggering the inhaler at same time you start breathing in.  Hold for up to 5 seconds if you can. Blow out thru nose. Rinse and gargle with water when done.  Prednisone 10 mg take  4 each am x 2 days,   2 each am x 2 days,  1 each am x 2 days and stop    Pantoprazole (protonix) 40 mg   Take  30-60 min before first meal of the day and Pepcid (famotidine)  20 mg one @  bedtime until return to office - this is the best way to tell whether stomach acid is contributing to your problem.     GERD (REFLUX)  is an extremely common cause of respiratory symptoms just like yours , many times with no obvious heartburn at all.    It can be treated with medication, but also with lifestyle changes including elevation of the head of your bed (ideally with 6 inch  bed blocks),  Smoking cessation, avoidance of late meals, excessive alcohol, and avoid fatty foods, chocolate, peppermint, colas, red wine, and acidic juices such as orange juice.  NO MINT OR MENTHOL PRODUCTS SO NO COUGH DROPS   USE SUGARLESS CANDY INSTEAD (Jolley ranchers or Stover's or Life Savers) or even ice chips will also do - the key is to swallow to prevent all throat clearing. NO OIL BASED VITAMINS - use powdered substitutes.  We will have [facility] do a best fit evaluation for your 02 needs    Please schedule a follow up office visit in 4 weeks, sooner if needed with all medications and inhalers to see Tammy NP on return to see if you need to be changed over to a nebulizer for all your medication needs

## 2017-12-26 ENCOUNTER — Encounter: Payer: Self-pay | Admitting: Internal Medicine

## 2017-12-26 NOTE — Assessment & Plan Note (Addendum)
Quit smoking 1980 -  PFT's December 03, 2009 FEV1 67 (40%) with 12% response to B2 and DLC0 55% - 12/24/2017  After extensive coaching inhaler device  effectiveness =    50% from a baseline of 25%    DDX of  difficult airways management almost all start with A and  include Adherence, Ace Inhibitors, Acid Reflux, Active Sinus Disease, Alpha 1 Antitripsin deficiency, Anxiety masquerading as Airways dz,  ABPA,  Allergy(esp in young), Aspiration (esp in elderly), Adverse effects of meds,  Active smokers, A bunch of PE's (a small clot burden can't cause this syndrome unless there is already severe underlying pulm or vascular dz with poor reserve) plus two Bs  = Bronchiectasis and Beta blocker use..and one C= CHF  Adherence is always the initial "prime suspect" and is a multilayered concern that requires a "trust but verify" approach in every patient - starting with knowing how to use medications, especially inhalers, correctly, keeping up with refills and understanding the fundamental difference between maintenance and prns vs those medications only taken for a very short course and then stopped and not refilled.  - see hfa testing  - if not improving consider laba/ics neb  - return with all meds in hand using a trust but verify approach to confirm accurate Medication  Reconciliation The principal here is that until we are certain that the  patients are doing what we've asked, it makes no sense to ask them to do more.    ? Acid (or non-acid) GERD > always difficult to exclude as up to 75% of pts in some series report no assoc GI/ Heartburn symptoms and she has dyshagia with h/o stricture > rec max (24h)  acid suppression and diet restrictions/ reviewed and instructions given in writing.    ? Asp > probably needs Dg Es if not better on gerd Rx  ? Allergy/ asthma component > continue high dose dulera plus Prednisone 10 mg take  4 each am x 2 days,   2 each am x 2 days,  1 each am x 2 days and stop   ?  Anxiety > usually at the bottom of this list of usual suspects but should be much higher on this pt's based on H and P and note already on psychotropics .    ? chf > last echo 07/10/16 c/w diastolic dysfunction but no recurrent effusions/ no significant edema on present rx and able to lie flat at hs so doubt this is contributing at present   I had an extended discussion with the patient reviewing all relevant studies completed to date and  lasting 25 minutes of a 40  minute acute office  visit to re-establish     re  severe non-specific but potentially very serious refractory respiratory symptoms of uncertain and potentially multiple  etiologies.   Each maintenance medication was reviewed in detail including most importantly the difference between maintenance and prns and under what circumstances the prns are to be triggered using an action plan format that is not reflected in the computer generated alphabetically organized AVS.    Please see AVS for specific instructions unique to this office visit that I personally wrote and verbalized to the the pt in detail and then reviewed with pt  by my nurse highlighting any changes in therapy/plan of care  recommended at today's visit.

## 2017-12-26 NOTE — Assessment & Plan Note (Signed)
On 02 2lpm since d/c from CAP 07/12/16 - ono RA  10/17/16  desat < 88% x 114 min so rec 2lpm hs to continue  -12/24/2017 referred for Best fit for 02

## 2018-01-05 ENCOUNTER — Other Ambulatory Visit: Payer: Self-pay | Admitting: Internal Medicine

## 2018-01-08 ENCOUNTER — Other Ambulatory Visit: Payer: Self-pay | Admitting: Internal Medicine

## 2018-01-08 ENCOUNTER — Telehealth: Payer: Self-pay | Admitting: Internal Medicine

## 2018-01-08 NOTE — Telephone Encounter (Signed)
Copied from CRM [id number]. Topic: Quick Communication - Rx Refill/Question >> Jan 08, 2018  2:33 PM Viviann SpareWhite, Selina wrote: Medication: mometasone-formoterol (DULERA) 200-5 MCG/ACT AERO    Has the patient contacted their pharmacy? Yes.     (Agent: If no, request that the patient contact the pharmacy for the refill.)   Preferred Pharmacy (with phone number or street name):  [facility] [phone] - [place] - [address] [facility] [address] [facility] [address] [place] [id number] Phone: [phone] Fax: [phone]     Agent: Please be advised that RX refills may take up to 3 business days. We ask that you follow-up with your pharmacy.

## 2018-01-08 NOTE — Telephone Encounter (Signed)
[facility] Sherene SiresWert. Verbalizes understanding.

## 2018-01-21 ENCOUNTER — Ambulatory Visit: Payer: Federal, State, Local not specified - PPO | Admitting: Adult Health

## 2018-01-21 ENCOUNTER — Encounter: Payer: Self-pay | Admitting: Adult Health

## 2018-01-21 DIAGNOSIS — J449 Chronic obstructive pulmonary disease, unspecified: Secondary | ICD-10-CM | POA: Diagnosis not present

## 2018-01-21 DIAGNOSIS — J9611 Chronic respiratory failure with hypoxia: Secondary | ICD-10-CM

## 2018-01-21 MED ORDER — ACLIDINIUM BROMIDE 400 MCG/ACT IN AEPB: 1.0000 | INHALATION_SPRAY | Freq: Two times a day (BID) | RESPIRATORY_TRACT | 5 refills | Status: DC

## 2018-01-21 MED ORDER — ACLIDINIUM BROMIDE 400 MCG/ACT IN AEPB: 1.0000 | INHALATION_SPRAY | Freq: Two times a day (BID) | RESPIRATORY_TRACT | 0 refills | Status: DC

## 2018-01-21 MED ORDER — PANTOPRAZOLE SODIUM 40 MG PO TBEC: 40.0000 mg | DELAYED_RELEASE_TABLET | Freq: Every day | ORAL | 5 refills | Status: DC

## 2018-01-21 NOTE — Progress Notes (Signed)
Patient seen in the office today and instructed on use of Tudorza.  Patient expressed understanding and demonstrated technique. Boone MasterJessica Jhett Fretwell, CMA 01/21/18

## 2018-01-21 NOTE — Assessment & Plan Note (Signed)
Severe COPD with increased symptom load with doe .  Will add LAMA  Patient's medications were reviewed today and patient education was given. Computerized medication calendar was adjusted/completed   Plan  Patient Instructions  Add Tudorza 1 puff Twice daily  , rinse after use  Follow med calendar closely and bring to each visit.  Follow up with Dr. Sherene SiresWert  In 3 months and As needed

## 2018-01-21 NOTE — Progress Notes (Signed)
Chart and office note reviewed in detail  > agree with a/p as outlined    

## 2018-01-21 NOTE — Progress Notes (Signed)
 @Patient  ID: Sharon [name], female    DOB: 1922/12/19, 84 y.o.   MRN: [id number]  Chief Complaint  Patient presents with  . Follow-up    COPD    Referring provider: Gordy SaversKwiatkowski, Peter F, MD  HPI: 84 yo female former smoker followed for GOLD III COPD/ emphysema, Bronchiectasis , Mild chronic nodular infiltrates dating back to 2009.  CAP 07/2016 w/ parapneumonic effusion s/p tap  01/21/2018 Follow up : COPD , Bronchiectasis , Mild chronic nodular infiltrates dating back to 2009  Patient presents for a one-month follow-up.  Patient was seen last visit for a COPD flare.  She was treated with a prednisone taper.  CXR w /severe emphysema changes . Patient says she is feeling about the same , gets winded with minimal activity . Unclear if prednisone made much difference .  No increased cough but has hard time coughing up thick mucus .  No significant dyspnea at rest.   We reviewed all her medications organized them into a medication calender with patient education inhaler teaching was given  Allergies  Allergen Reactions  . Penicillins Rash    Has patient had a PCN reaction causing immediate rash, facial/tongue/throat swelling, SOB or lightheadedness with hypotension: Yes Has patient had a PCN reaction causing severe rash involving mucus membranes or skin necrosis: Unknown Has patient had a PCN reaction that required hospitalization: Unknown Has patient had a PCN reaction occurring within the last 10 years: No If all of the above answers are "NO", then may proceed with Cephalosporin use.     Immunization History  Administered Date(s) Administered  . Influenza Split 09/26/2011, 09/27/2012  . Influenza Whole 09/30/2009  . Influenza, High Dose Seasonal PF 09/24/2013, 01/10/2016, 09/21/2016, 09/25/2017  . Influenza-Unspecified 10/13/2014  . Pneumococcal Conjugate-13 02/11/2014  . Pneumococcal Polysaccharide-23 05/05/2009  . Td 05/05/2009    Past Medical History:  Diagnosis Date  .  Anemia   . B12 DEFICIENCY 03/31/2008  . COPD 11/27/2008  . ESOPHAGEAL STRICTURE 01/29/2009  . MASS, LUNG 10/13/2009  . NASAL FRACTURE 08/12/2009  . OSTEOARTHRITIS 03/31/2008   Hips  . OSTEOPOROSIS 12/27/2007  . PEDAL EDEMA 12/27/2007  . Pneumonia 07/10/2016  . PULMONARY NODULE 10/21/2009  . RESPIRATORY FAILURE, CHRONIC 06/29/2010  . Shortness of breath dyspnea     Tobacco History: Social History   Tobacco Use  Smoking Status Former Smoker  . Packs/day: 0.50  . Years: 35.00  . Pack years: 17.50  . Last attempt to quit: 12/18/1978  . Years since quitting: 39.1  Smokeless Tobacco Never Used   Counseling given: Not Answered   Outpatient Encounter Medications as of 01/21/2018  Medication Sig  . Aclidinium Bromide (TUDORZA PRESSAIR) 400 MCG/ACT AEPB Inhale 1 puff into the lungs 2 (two) times daily.  . Aclidinium Bromide (TUDORZA PRESSAIR) 400 MCG/ACT AEPB Inhale 1 puff into the lungs 2 (two) times daily.  Marland Kitchen. ALPRAZolam (XANAX) 0.25 MG tablet TAKE 1 TABLET BY MOUTH 3 TIMES DAILY AS NEEDED FOR ANXIETY  . Calcium Carbonate-Vitamin D (CALTRATE 600+D) 600-400 MG-UNIT per chew tablet Chew 1 tablet by mouth every morning.   . cyanocobalamin (,VITAMIN B-12,) 1000 MCG/ML injection INJECT 1 ML INTO THE MUSCLE ONCE MONTHLY  . DULERA 200-5 MCG/ACT AERO INHALE 2 PUFFS INTO THE LUNGS TWICE DAILY  . escitalopram (LEXAPRO) 5 MG tablet TAKE 1 TABLET BY MOUTH DAILY  . famotidine (PEPCID) 20 MG tablet One at bedtime  . Ipratropium-Albuterol (COMBIVENT) 20-100 MCG/ACT AERS respimat Inhale 1 puff into the lungs every 6 (  six) hours as needed for wheezing or shortness of breath.  . mirtazapine (REMERON) 15 MG tablet Take 1 tablet (15 mg total) by mouth at bedtime.  . Multiple Vitamins-Minerals (ICAPS AREDS 2) CAPS Take 1 capsule by mouth daily.   . OXYGEN Oxygen 3lpm 24/7  . pantoprazole (PROTONIX) 40 MG tablet Take 1 tablet (40 mg total) by mouth daily. Take 30-60 min before first meal of the day  . Polyethyl  Glycol-Propyl Glycol (SYSTANE OP) Place 1 drop into both eyes daily.   . [DISCONTINUED] predniSONE (DELTASONE) 10 MG tablet Take  4 each am x 2 days,   2 each am x 2 days,  1 each am x 2 days and stop (Patient not taking: Reported on 01/21/2018)   No facility-administered encounter medications on file as of 01/21/2018.      Review of Systems  Constitutional:   No  weight loss, night sweats,  Fevers, chills, + fatigue, or  lassitude.  HEENT:   No headaches,  Difficulty swallowing,  Tooth/dental problems, or  Sore throat,                No sneezing, itching, ear ache, nasal congestion, post nasal drip,   CV:  No chest pain,  Orthopnea, PND, swelling in lower extremities, anasarca, dizziness, palpitations, syncope.   GI  No heartburn, indigestion, abdominal pain, nausea, vomiting, diarrhea, change in bowel habits, loss of appetite, bloody stools.   Resp:  No chest wall deformity  Skin: no rash or lesions.  GU: no dysuria, change in color of urine, no urgency or frequency.  No flank pain, no hematuria   MS:  No joint pain or swelling.  No decreased range of motion.  No back pain.    Physical Exam  BP 114/68 (BP Location: Left Arm, Cuff Size: Normal)   Pulse 70   Ht 5\' 5"  (1.651 m)   Wt 96 lb (43.5 kg)   SpO2 96%   BMI 15.98 kg/m   GEN: A/Ox3; pleasant , NAD, thin frail in wheelchair   HEENT:  [place]/AT,  EACs-clear, TMs-wnl, NOSE-clear, THROAT-clear, no lesions, no postnasal drip or exudate noted.   NECK:  Supple w/ fair ROM; no JVD; normal carotid impulses w/o bruits; no thyromegaly or nodules palpated; no lymphadenopathy.    RESP scattered rhonchi no accessory muscle use, no dullness to percussion  CARD:  RRR, no m/r/g, no peripheral edema, pulses intact, no cyanosis or clubbing.  GI:   Soft & nt; nml bowel sounds; no organomegaly or masses detected.   Musco: Warm bil, no deformities or joint swelling noted.   Neuro: alert, no focal deficits noted.    Skin: Warm, no  lesions or rashes    Lab Results:   BMET  BNP No results found for: BNP   Imaging: Dg Chest 2 View  Result Date: 12/24/2017 CLINICAL DATA:  Recent worsening of chronic shortness of breath. EXAM: CHEST  2 VIEW COMPARISON:  CT chest and PA and lateral chest 07/19/2017. PA and lateral chest 10/13/2016. FINDINGS: The lungs are severely emphysematous. No consolidative process, nodule or effusion is identified. No pneumothorax. Heart size is normal. Aortic atherosclerosis noted. IMPRESSION: Severe emphysema without acute disease. Electronically Signed   By: Drusilla Kanner M.D.   On: 12/24/2017 15:11     Assessment & Plan:   COPD GOLD III  Severe COPD with increased symptom load with doe .  Will add LAMA  Patient's medications were reviewed today and patient education was given. Computerized  medication calendar was adjusted/completed   Plan  Patient Instructions  Add Tudorza 1 puff Twice daily  , rinse after use  Follow med calendar closely and bring to each visit.  Follow up with Dr. Sherene Sires  In 3 months and As needed        Chronic respiratory failure with hypoxia (HCC) Cont on O2 .      Rubye Oaks, NP 01/21/2018

## 2018-01-21 NOTE — Assessment & Plan Note (Signed)
Cont on O2 .  

## 2018-01-21 NOTE — Addendum Note (Signed)
Addended by: Boone MasterJONES, JESSICA E on: 01/21/2018 05:33 PM   Modules accepted: Orders

## 2018-01-21 NOTE — Patient Instructions (Signed)
Add Tudorza 1 puff Twice daily  , rinse after use  Follow med calendar closely and bring to each visit.  Follow up with Dr. Sherene SiresWert  In 3 months and As needed

## 2018-01-23 IMAGING — DX DG CHEST 2V
2 series · 2 of 2 positions shown · non-contrast
Comparison: 08/17/2016

CLINICAL DATA: Cough. Fever and chills for 6 days. Rule out
pneumonia.

EXAM:
CHEST  2 VIEW

[chest pa]
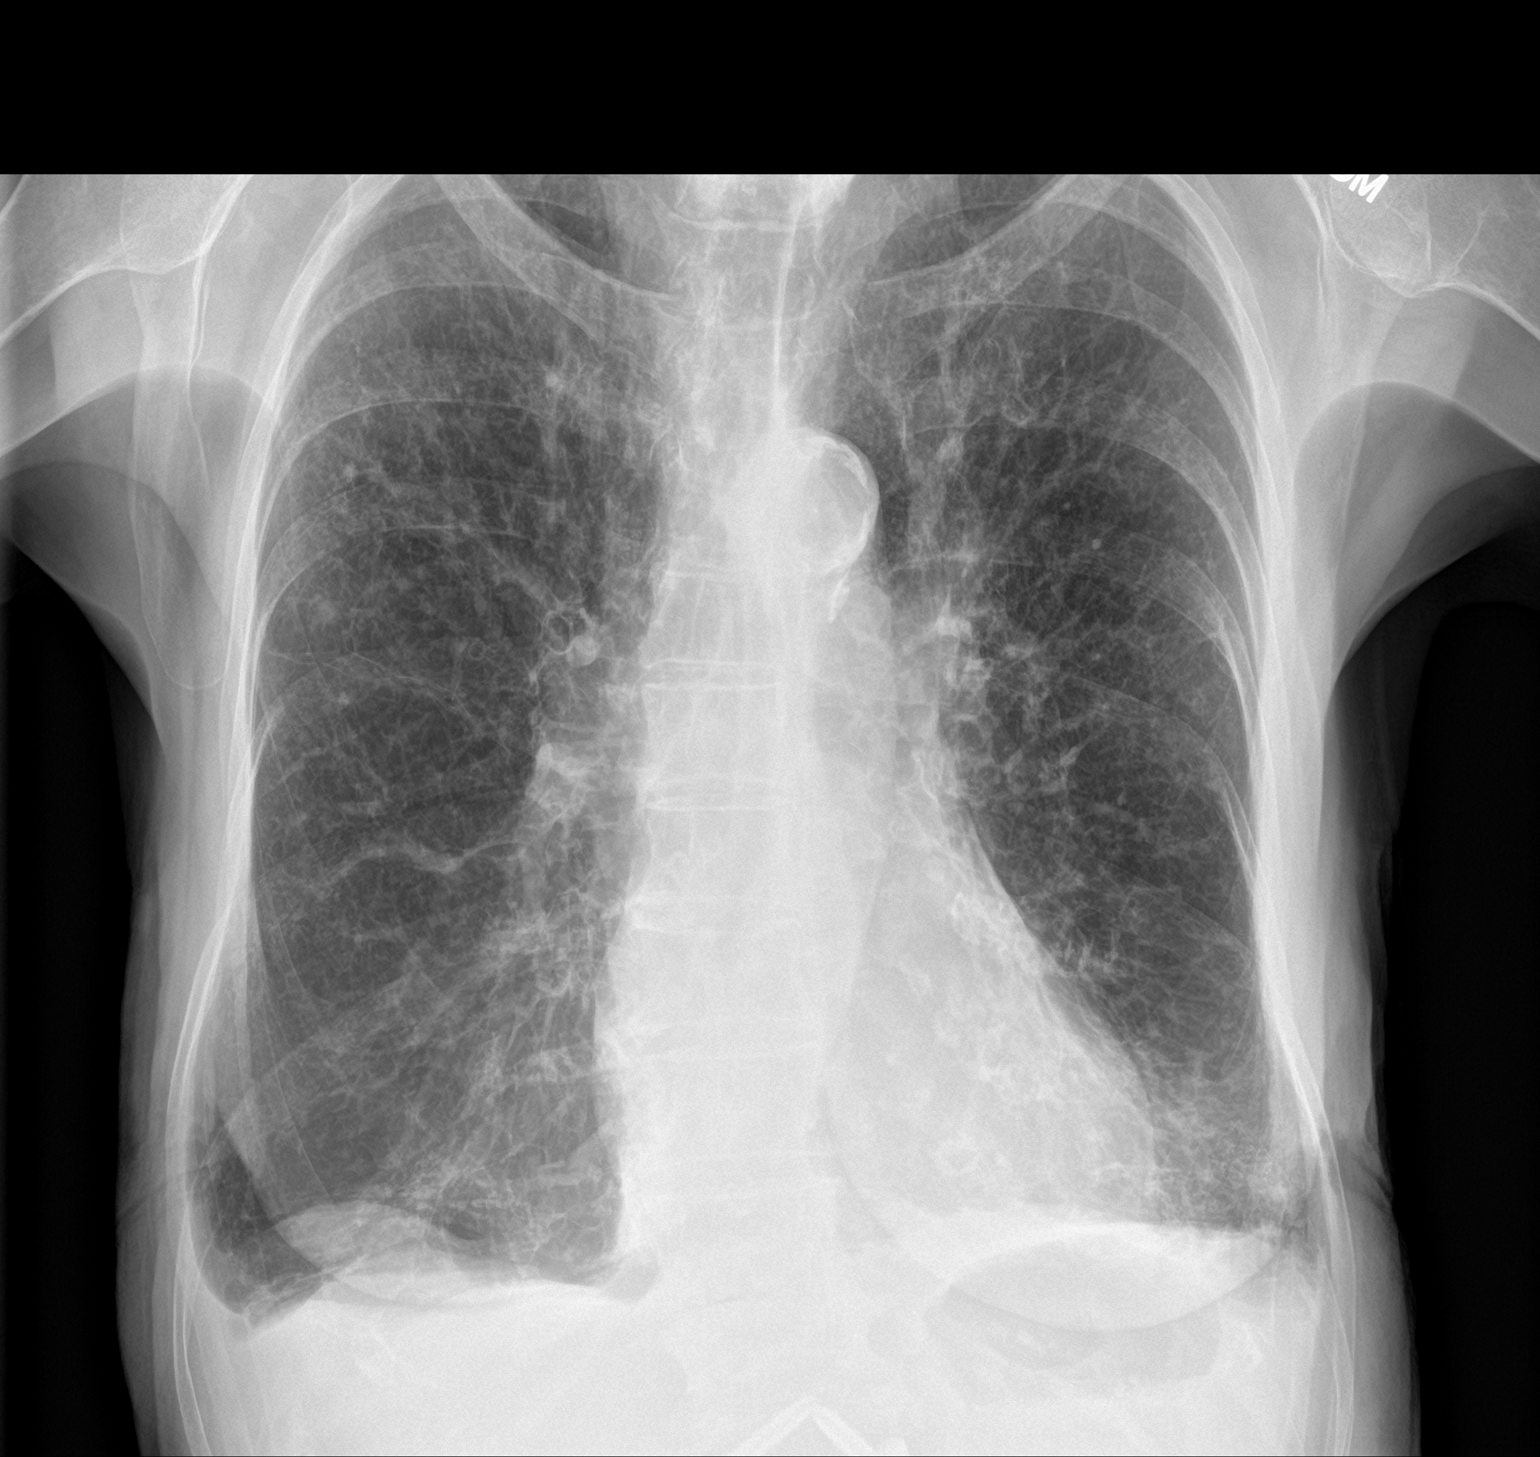

[chest lat]
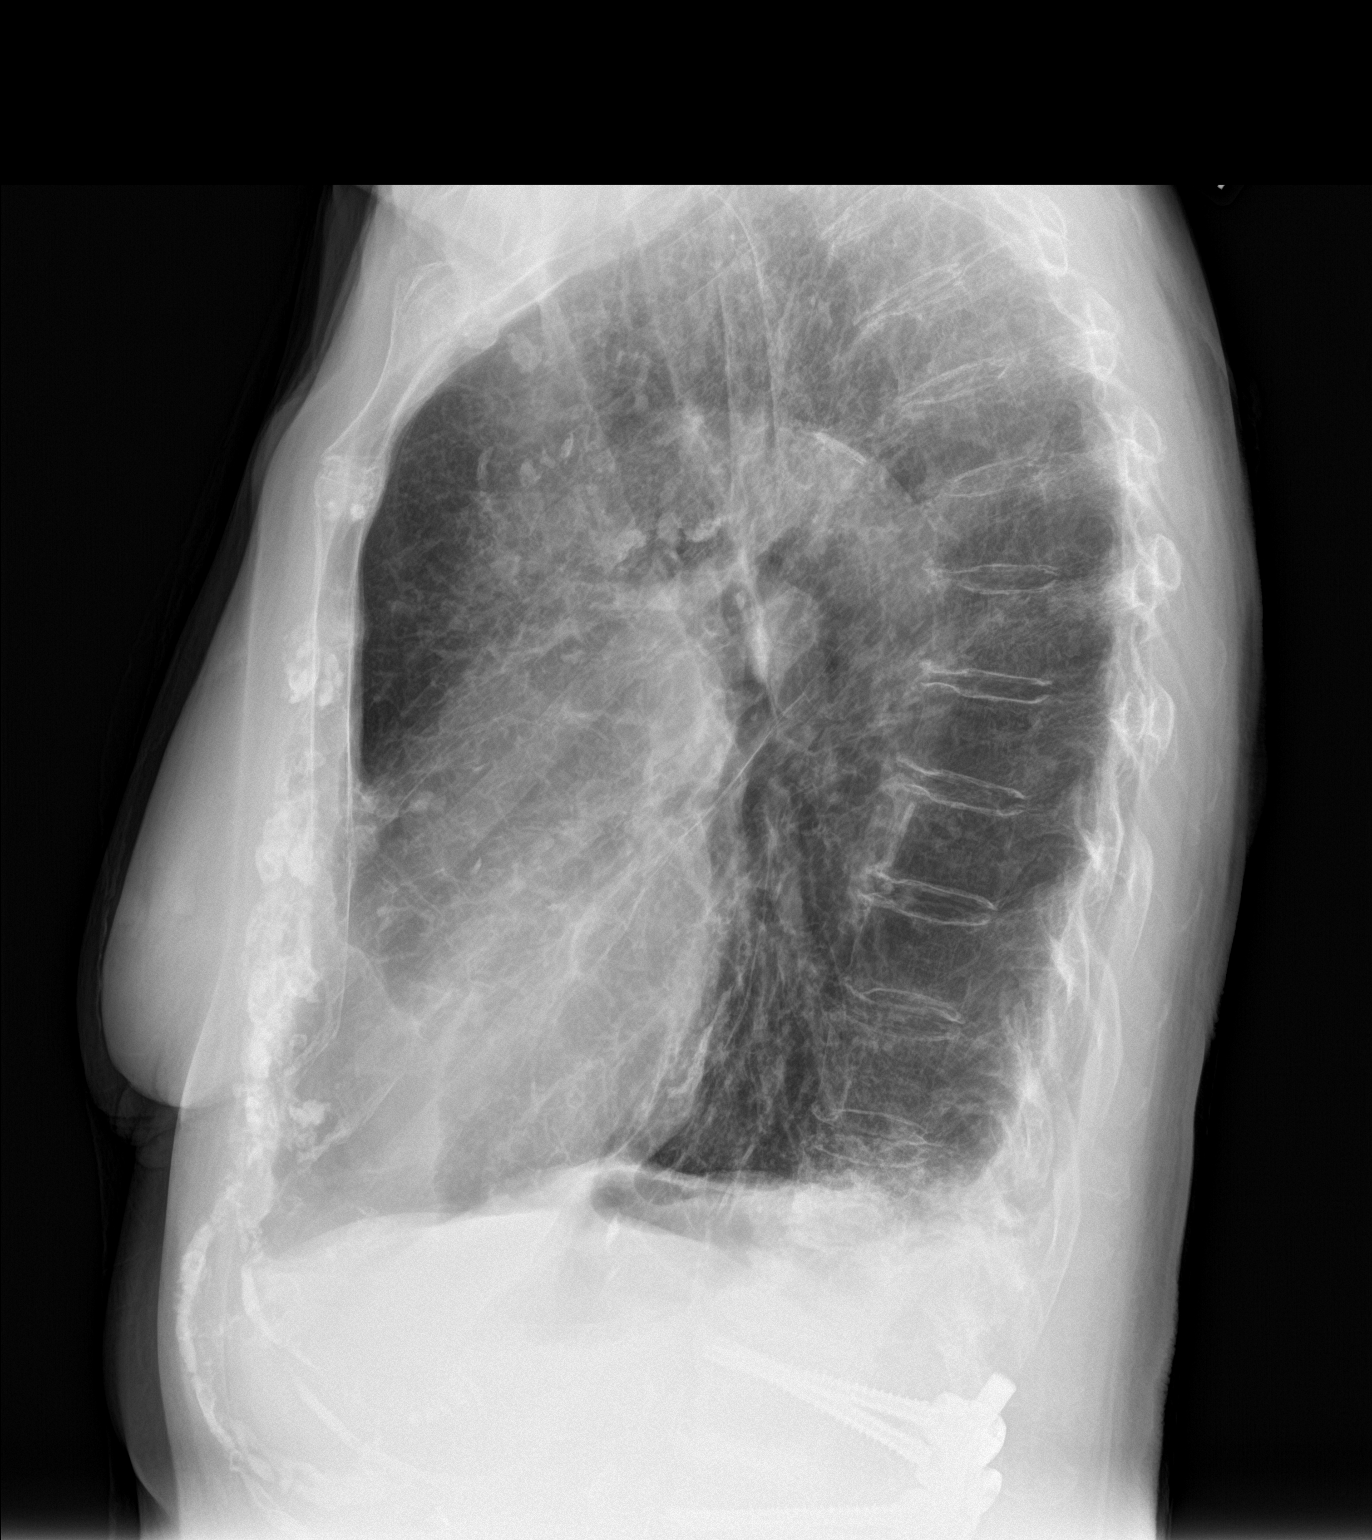

[2 of 2 positions shown; findings below may reference images not displayed]

FINDINGS: Increased opacity at the left base when compared to prior. There is
advanced emphysema with hyperinflation. Stable borderline heart
size. Aortic tortuosity.
IMPRESSION: 1. New left basilar opacity, probable bronchopneumonia in this
setting.
2. Emphysema.

## 2018-01-23 NOTE — Addendum Note (Signed)
Addended by: Boone MasterJONES, Meadow Abramo E on: 01/23/2018 11:45 AM   Modules accepted: Orders

## 2018-01-24 ENCOUNTER — Telehealth: Payer: Self-pay | Admitting: Internal Medicine

## 2018-01-24 NOTE — Telephone Encounter (Signed)
Called and spoke with pt's daughter Sharon [name]. Sharon [name] is aware that we typically suggest waiting 15min in between inhalers.  Sharon [name] also wanted to make PM aware that pt is now taken generic Lexapro 5mg  daily.  Will route to MW as an Financial plannerYI. Thanks  12/24/17 AVS Patient Instructions    Work on inhaler technique:  relax and gently blow all the way out then take a nice smooth deep breath back in, triggering the inhaler at same time you start breathing in.  Hold for up to 5 seconds if you can. Blow out thru nose. Rinse and gargle with water when done.  Prednisone 10 mg take  4 each am x 2 days,   2 each am x 2 days,  1 each am x 2 days and stop    Pantoprazole (protonix) 40 mg   Take  30-60 min before first meal of the day and Pepcid (famotidine)  20 mg one @  bedtime until return to office - this is the best way to tell whether stomach acid is contributing to your problem.     GERD (REFLUX)  is an extremely common cause of respiratory symptoms just like yours , many times with no obvious heartburn at all.    It can be treated with medication, but also with lifestyle changes including elevation of the head of your bed (ideally with 6 inch  bed blocks),  Smoking cessation, avoidance of late meals, excessive alcohol, and avoid fatty foods, chocolate, peppermint, colas, red wine, and acidic juices such as orange juice.  NO MINT OR MENTHOL PRODUCTS SO NO COUGH DROPS   USE SUGARLESS CANDY INSTEAD (Jolley ranchers or Stover's or Life Savers) or even ice chips will also do - the key is to swallow to prevent all throat clearing. NO OIL BASED VITAMINS - use powdered substitutes.  We will have [facility] do a best fit evaluation for your 02 needs    Please schedule a follow up office visit in 4 weeks, sooner if needed with all medications and inhalers to see Tammy NP on return to see if you need to be changed over to a nebulizer for all your medication needs

## 2018-02-14 ENCOUNTER — Other Ambulatory Visit: Payer: Self-pay | Admitting: Internal Medicine

## 2018-03-20 ENCOUNTER — Telehealth: Payer: Self-pay
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
## 2018-03-25 ENCOUNTER — Ambulatory Visit: Payer: Federal, State, Local not specified - PPO | Admitting: Internal Medicine

## 2018-03-28 ENCOUNTER — Telehealth: Payer: Self-pay | Admitting: Family Medicine

## 2018-03-28 NOTE — Telephone Encounter (Signed)
Copied from CRM [id number]. Topic: Appointment Scheduling - Scheduling Inquiry for Clinic >> Mar 28, 2018  8:56 AM Clack, Sharon [name] wrote: Reason for CRM: Pt son Sharon [name] states pt needs [name]. F/u. She will be back in town on Sunday, Dr. Kirtland BouchardK does not have any open slots. Sharon [name] states pt would prefer to see Dr. Kirtland BouchardK.  [facility]  >> Mar 28, 2018  9:07 AM Nimmons, Sharon CritchleySylvia A, RN wrote: Looked at schedule for next week, only same days that are open Wed/Thur, none that we can put together to make [name] 30 minute hospital follow up. I will check with practice administrator for advice and approval. >> Mar 28, 2018 10:19 AM Nimmons, Sharon CritchleySylvia A, RN wrote: Pt has been scheduled for 04/03/2018 Wednesday at 2:15 pm, ok/per provider. I left [name] voice message for pt or Sharon [name] to call back and confirm, advised to keep this appointment, there is no other openings available.  >> Mar 28, 2018  1:15 PM [name], Sharon [name] wrote:  Sharon MondaySpoke with pt son he is  aware of pt appt on 04/03/18   >> Mar 28, 2018  3:59 PM Nimmons, Sharon CritchleySylvia A, RN wrote: I spoke with Sharon [name]/son and confirmed the appointment.

## 2018-03-28 NOTE — Telephone Encounter (Signed)
Pt signed medical release form while out of town, we should be getting records from hospital.

## 2018-04-01 NOTE — Telephone Encounter (Signed)
Noted  

## 2018-04-03 ENCOUNTER — Encounter: Payer: Self-pay | Admitting: Internal Medicine

## 2018-04-03 ENCOUNTER — Ambulatory Visit: Payer: Federal, State, Local not specified - PPO | Admitting: Internal Medicine

## 2018-04-03 ENCOUNTER — Other Ambulatory Visit: Payer: Self-pay | Admitting: Internal Medicine

## 2018-04-03 VITALS — BP 110/70 | HR 76 | Temp 98.8°F | Wt 101.0 lb

## 2018-04-03 DIAGNOSIS — R05 Cough: Secondary | ICD-10-CM | POA: Diagnosis not present

## 2018-04-03 DIAGNOSIS — R600 Localized edema: Secondary | ICD-10-CM | POA: Diagnosis not present

## 2018-04-03 DIAGNOSIS — R5381 Other malaise: Secondary | ICD-10-CM

## 2018-04-03 DIAGNOSIS — Z8781 Personal history of (healed) traumatic fracture: Secondary | ICD-10-CM

## 2018-04-03 DIAGNOSIS — R059 Cough, unspecified: Secondary | ICD-10-CM

## 2018-04-03 DIAGNOSIS — I480 Paroxysmal atrial fibrillation: Secondary | ICD-10-CM

## 2018-04-03 DIAGNOSIS — J9611 Chronic respiratory failure with hypoxia: Secondary | ICD-10-CM

## 2018-04-03 MED ORDER — IPRATROPIUM-ALBUTEROL 0.5-2.5 (3) MG/3ML IN SOLN: 3.0000 mL | Freq: Four times a day (QID) | RESPIRATORY_TRACT | 6 refills | Status: DC | PRN

## 2018-04-03 MED ORDER — MOMETASONE FUROATE 220 MCG/INH IN AEPB: 2.0000 | INHALATION_SPRAY | Freq: Every day | RESPIRATORY_TRACT | 12 refills | Status: DC

## 2018-04-03 NOTE — Patient Instructions (Signed)
Limit your sodium (Salt) intake  Consider Home physical therapy  Return in 3 weeks for follow-up
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
## 2018-04-09 ENCOUNTER — Telehealth: Payer: Self-pay | Admitting: Internal Medicine

## 2018-04-09 ENCOUNTER — Other Ambulatory Visit: Payer: Self-pay

## 2018-04-09 NOTE — Telephone Encounter (Signed)
Copied from CRM [id number]. Topic: Quick Communication - See Telephone Encounter >> Apr 09, 2018  4:19 PM Diana EvesHoyt, Maryann B wrote: CRM for notification. See Telephone encounter for: 04/09/18.  Misty StanleyLisa OT with [facility] calling for verbal orders 1x 1 week and 2x 5 weeks. Call back number [phone].

## 2018-04-09 NOTE — Telephone Encounter (Signed)
Okay for verbal orders. 
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
## 2018-04-09 NOTE — Telephone Encounter (Signed)
Misty StanleyLisa given Verbal orders.

## 2018-04-09 NOTE — Telephone Encounter (Signed)
Patient should be on Xarelto once daily only.  Please call patient and family to clarify

## 2018-04-09 NOTE — Telephone Encounter (Signed)
Copied from CRM [id number]. Topic: Quick Communication - See Telephone Encounter >> Apr 09, 2018  1:06 PM Lorrine KinMcGee, Sophee Mckimmy B, NT wrote: CRM for notification. See Telephone encounter for: 04/09/18. Beaulah DinningKathy Sindt, patient's daughter in law, calling and is needing some clarification on the Rivaroxaban (XARELTO) 15 MG TABS tablet. States that on 4/17 Dr Amador CunasKwiatkowski printed on the med list take 2x a day. Current rx that patient has states to take 1x a day. Does Dr Amador CunasKwiatkowski want to up the dosage to 2x a day? Please clarify. CB#: [phone] Would like a call back.

## 2018-04-09 NOTE — Telephone Encounter (Signed)
[facility] updated on taking Rx 1 time daily.

## 2018-04-09 NOTE — Telephone Encounter (Signed)
Noted  
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
## 2018-04-11 ENCOUNTER — Other Ambulatory Visit: Payer: Self-pay

## 2018-04-11 MED ORDER — ESCITALOPRAM OXALATE 5 MG PO TABS: 5.0000 mg | ORAL_TABLET | Freq: Every day | ORAL | 0 refills | Status: DC

## 2018-04-11 NOTE — Telephone Encounter (Signed)
Spoke to Sharon [name] patient's daughter and she said the patient had some in another bottle that she could open easily however Sharon [name] doesn't know if it is Lexapro. I informed Sharon [name] that I called in another Rx and if pt insurance pays for lost medication the pharmacy should be calling them to pick up Rx. I also advise Sharon [name] not to giver the patient the unknown medication for safety reasons.

## 2018-04-15 NOTE — Telephone Encounter (Signed)
Okay to discontinue Lexapro if she is stable off this medication

## 2018-04-15 NOTE — Telephone Encounter (Signed)
Patient daughter is stating that now she wants her mom to only be on Lexapro and discontinue xanax. Olegario Messier claims that her mother is ice cold to the touch every time she uses xanax. Olegario Messier states she doesn't know what to do.

## 2018-04-15 NOTE — Telephone Encounter (Signed)
Okay to resume Lexapro 5 mg once daily and to discontinue alprazolam

## 2018-04-15 NOTE — Telephone Encounter (Signed)
Noted  

## 2018-04-15 NOTE — Telephone Encounter (Signed)
Spoke to patient daughter Olegario Messier and she stated that the patient hasn't taking the medication in months and she is fine because she is getting her xanax as needed. Olegario Messier found medication and I had her read the indentation on the medication and verfied that the medication was correct and it was. Olegario Messier was informed that I will return phone call after speaking to [facility] to see if she needs to continue Rx.

## 2018-04-16 NOTE — Telephone Encounter (Signed)
Sharon [name] was updated on change in Rx therapy.

## 2018-04-21 ENCOUNTER — Other Ambulatory Visit: Payer: Self-pay | Admitting: Internal Medicine

## 2018-04-22 ENCOUNTER — Encounter: Payer: Self-pay | Admitting: Internal Medicine

## 2018-04-22 ENCOUNTER — Ambulatory Visit (INDEPENDENT_AMBULATORY_CARE_PROVIDER_SITE_OTHER): Payer: Federal, State, Local not specified - PPO | Admitting: Internal Medicine

## 2018-04-22 VITALS — BP 118/60 | HR 69 | Ht 65.0 in | Wt 96.0 lb

## 2018-04-22 DIAGNOSIS — K222 Esophageal obstruction: Secondary | ICD-10-CM | POA: Diagnosis not present

## 2018-04-22 DIAGNOSIS — J449 Chronic obstructive pulmonary disease, unspecified: Secondary | ICD-10-CM | POA: Diagnosis not present

## 2018-04-22 DIAGNOSIS — J9611 Chronic respiratory failure with hypoxia: Secondary | ICD-10-CM

## 2018-04-22 MED ORDER — MOMETASONE FURO-FORMOTEROL FUM 200-5 MCG/ACT IN AERO: INHALATION_SPRAY | RESPIRATORY_TRACT | 11 refills | Status: DC

## 2018-04-22 MED ORDER — TIOTROPIUM BROMIDE MONOHYDRATE 2.5 MCG/ACT IN AERS: 2.0000 | INHALATION_SPRAY | Freq: Every day | RESPIRATORY_TRACT | 11 refills | Status: DC

## 2018-04-22 NOTE — Progress Notes (Signed)
Subjective:    Patient ID: Sharon [name], female    DOB: 09-15-1923,    MRN: [id number]   Brief patient profile:  89  yowf quit smoking in 1980 with no minimal chronic doe assoc mild chronic nodular infiltrates dating back to 11/23/2008 with documented GOLD III criteria copd/ bronchiectasis by CT 09/2009 both lower lobes       Admit date: 07/09/2016 Discharge date: 07/12/2016  Discharge Diagnoses:  Principal Problem:   CAP (community acquired pneumonia)   Depression   Community acquired pneumonia   COPD (chronic obstructive pulmonary disease) with emphysema (HCC)   Sepsis (HCC)   Acute respiratory failure with hypoxia (HCC)   Hyponatremia      08/16/2016 1st [facility] office visit/Epic era/ Nayeliz Hipp  Re s/p CAP in pt with bronchiectasis/ GOLD III COPD  Chief Complaint  Patient presents with  . Pulmonary Consult    Referred by Dr. Eleonore Chiquito for pleural effusion. Pt states that she has been dxed with PNA x 2 since July 2017. She has had cough and SOB. Cough is currently non prod.   baseline = MMRC2 = can't walk a nl pace on a flat grade s sob but does fine slow and flat eg [employer] shopping/ uses [facility] parking on [address] 2bid and rare need for combivent  Acutely ill with hoarseness in [place] p getting off plan from [place] then dry cough and admit: Now on 02 at 3lpm 24/7 rec Please see patient coordinator before you leave today  to schedule venous doppler asap on Left> neg  Left  thoracentesis 08/17/16 =  1 liter on Left  WBC 1,020  L > P  Transudate with nl glucose/ cyt neg       12/24/2017  Extended acute  ov/Kree Armato re: re-establish   COPD GOLD III with bronchiectasis  And prior tranusdative efffuson  Chief Complaint  Patient presents with  . Acute Visit    SOB with exertion, non productive cough more in the morning and with talking,nasal congestion,left foot swelling  solid food / pill dysphagia Sleeps left side down horizontally  Worse doe x 07/2017 rx   dulera 200 2bid helped some but still needing combivent maybe once or whice a day but not noct  Doe now = MMRC3 = can't walk 100 yards even at a slow pace at a flat grade s stopping due to sob  ? On approp 02?  rec Work on inhaler technique:  relax and gently blow all the way out then take a nice smooth deep breath back in, triggering the inhaler at same time you start breathing in.  Hold for up to 5 seconds if you can. Blow out thru nose. Rinse and gargle with water when done. Prednisone 10 mg take  4 each am x 2 days,   2 each am x 2 days,  1 each am x 2 days and stop  Pantoprazole (protonix) 40 mg   Take  30-60 min before first meal of the day and Pepcid (famotidine)  20 mg one @  bedtime until return to office - this is the best way to tell whether stomach acid is contributing to your problem.   GERD   We will have [facility] do a best fit evaluation for your 02 needs     04/22/2018  f/u ov/Towanda Hornstein re:   Copd III / bronchiectasis / on amiodarone now  Chief Complaint  Patient presents with  . Follow-up    Breathing is about the  same. She has good and bad days.    Dyspnea:  50 ft on 3lpm / 2sitting / sleeping Cough: green mucus since admit but improving Sleep: horizontal SABA use:  Just using duoneb   Back at home p hip fx in [place]  / Dr Kirtland Bouchard primary  Dry mouth from spiriva dpi    No  obvious day to day or daytime variability or assoc excess/ purulent sputum or mucus plugs or hemoptysis or cp or chest tightness, subjective wheeze or overt sinus or hb symptoms. No unusual exposure hx or h/o childhood pna/ asthma or knowledge of premature birth.  Sleeping ok flat without nocturnal  or early am exacerbation  of respiratory  c/o's or need for noct saba. Also denies any obvious fluctuation of symptoms with weather or environmental changes or other aggravating or alleviating factors except as outlined above   Current Allergies, Complete Past Medical History, Past Surgical History, Family History,  and Social History were reviewed in [employer] record.  ROS  The following are not active complaints unless bolded Hoarseness, sore throat, dysphagia, dental problems, itching, sneezing,  nasal congestion or discharge of excess mucus or purulent secretions, ear ache,   fever, chills, sweats, unintended wt loss or wt gain, classically pleuritic or exertional cp,  orthopnea pnd or leg swelling on L ever since hip fx , presyncope, palpitations, abdominal pain, anorexia, nausea, vomiting, diarrhea  or change in bowel habits or change in bladder habits, change in stools or change in urine, dysuria, hematuria,  rash, arthralgias, visual complaints, headache, numbness, weakness or ataxia or problems with walking or coordination,  change in mood/affect or memory.        Current Meds  Medication Sig  . ALPRAZolam (XANAX) 0.25 MG tablet TAKE 1 TABLET BY MOUTH 3 TIMES DAILY AS NEEDED FOR ANXIETY  . amiodarone (PACERONE) 200 MG tablet TK 1 T PO D FOR AFIB  . Cholecalciferol 50000 units TABS Take 5,000 Units by mouth.  . cyanocobalamin (,VITAMIN B-12,) 1000 MCG/ML injection INJECT 1 ML INTO THE MUSCLE ONCE MONTHLY  . dextromethorphan-guaiFENesin (MUCINEX DM) 30-600 MG 12hr tablet Take 1 tablet by mouth 2 (two) times daily as needed (cough/congestion).  Marland Kitchen escitalopram (LEXAPRO) 5 MG tablet Take 1 tablet (5 mg total) by mouth daily.  . famotidine (PEPCID) 20 MG tablet One at bedtime  . ipratropium-albuterol (DUONEB) 0.5-2.5 (3) MG/3ML SOLN Take 3 mLs by nebulization every 6 (six) hours as needed.  . mirtazapine (REMERON) 15 MG tablet Take 1 tablet (15 mg total) by mouth at bedtime.  . mometasone (ASMANEX 60 METERED DOSES) 220 MCG/INH inhaler Inhale 2 puffs into the lungs daily.  . Multiple Vitamins-Minerals (ICAPS AREDS 2) CAPS Take 1 capsule by mouth daily.   . OXYGEN Oxygen 3lpm 24/7  . pantoprazole (PROTONIX) 40 MG tablet Take 1 tablet (40 mg total) by mouth daily. Take 30-60 min  before first meal of the day  . Polyethyl Glycol-Propyl Glycol (SYSTANE OP) Place 1 drop into both eyes daily.   Marland Kitchen tiotropium (SPIRIVA) 18 MCG inhalation capsule Place 18 mcg into inhaler and inhale daily.                   Past Medical History: Osteoporosis B12 deficiency Osteoarthritis COPD    - PFT's December 03, 2009 FEV1 67 (40%) with 12% response to B2 and DLC0 55%    - HFA 25% June 29, 2010 > 75% August 11, 2010 > 75% November 03, 2010  Fx  Right Hip and required ORIF June 19th 2011> 02 dependent since > d/c November 03, 2010  history of esophageal stricture...........................................Marland KitchenPatterson Multiple pulmonary nodules..................................................Marland KitchenWert    -CT chest 10/13/09 assoc with bilateral lower lobe bronchiectasis    -CXR December 03, 2009 no change  > no change June 29, 2010            Objective:   Physical Exam   W/c bound elderly wf nad    04/22/2018           96  12/24/2017           96   08/16/16 102 lb (46.3 kg)  08/15/16 101 lb 4 oz (45.9 kg)  07/28/16 99 lb 12.8 oz (45.3 kg)     Vital signs reviewed - Note on arrival 02 sats  93% on 2lpm pulsed            HEENT: nl dentition, turbinates bilaterally, and oropharynx. Nl external ear canals without cough reflex   NECK :  without JVD/Nodes/TM/ nl carotid upstrokes bilaterally   LUNGS: no acc muscle use,  slt kyphotic chest with distant bs bilaterally s wheeze or cough on insp/ exp  maneuvers   CV:  RRR  no s3 or murmur or increase in P2, and  1+ pitting on L , trace on R LE in elastic hose  ABD:  soft and nontender with nl inspiratory excursion in the supine position. No bruits or organomegaly appreciated, bowel sounds nl  MS:   ext warm without deformities, calf tenderness, cyanosis or clubbing    SKIN: warm and dry without lesions    NEURO:  alert, approp, nl sensorium with  no motor or cerebellar deficits apparent.                            Assessment & Plan:

## 2018-04-22 NOTE — Patient Instructions (Addendum)
Try spiriva respimat 2 puffs each am instead of powder form  See calendar for specific medication instructions and bring it back for each and every office visit for every healthcare provider you see.  Without it,  you may not receive the best quality medical care that we feel you deserve.  You will note that the calendar groups together  your maintenance  medications that are timed at particular times of the day.  Think of this as your checklist for what your doctor has instructed you to do until your next evaluation to see what benefit  there is  to staying on a consistent group of medications intended to keep you well.  The other group at the bottom is entirely up to you to use as you see fit  for specific symptoms that may arise between visits that require you to treat them on an as needed basis.  Think of this as your action plan or "what if" list.   Separating the top medications from the bottom group is fundamental to providing you adequate care going forward.      See Tammy NP in 3 months with all your medications, even over the counter meds, separated in two separate bags, the ones you take no matter what vs the ones you stop once you feel better and take only as needed when you feel you need them.   Tammy  will generate for you a new user friendly medication calendar that will put [place] all on the same page re: your medication use.

## 2018-04-23 ENCOUNTER — Encounter: Payer: Self-pay | Admitting: Internal Medicine

## 2018-04-23 NOTE — Assessment & Plan Note (Addendum)
Quit smoking 1980 -  PFT's December 03, 2009 FEV1 67 (40%) with 12% response to B2 and DLC0 55% - 12/24/2017  After extensive coaching inhaler device  effectiveness =    50% from a baseline of 25%   She is doing relatively well given her age and s/p L Hip fx status with main complaint = dry mouth from spiriva dpi so rec try spiriva respimat esp since her rescue is also the respimat device = combivent    I had an extended discussion with the patient reviewing all relevant studies completed to date and  lasting 15 to 20 minutes of a 25 minute visit   Device teaching  /explanation  extended face to face time for this visit    Each maintenance medication was reviewed in detail including most importantly the difference between maintenance and prns and under what circumstances the prns are to be triggered using an action plan format that is not reflected in the computer generated alphabetically organized AVS but trather by a customized med calendar that reflects the AVS meds with confirmed 100% correlation.   In addition, Please see AVS for unique instructions that I personally wrote and verbalized to the the pt in detail and then reviewed with pt  by my nurse highlighting any  changes in therapy recommended at today's visit to their plan of care.

## 2018-04-23 NOTE — Assessment & Plan Note (Addendum)
Note no longer on gerd rx, chronic problem swallowing pills now worse off ppi but advised pt/ fm  would have very low threshold to resume ppi for worse dysphagia, hb or concern with LPR/ cough

## 2018-04-23 NOTE — Assessment & Plan Note (Signed)
On 02 2lpm since d/c from CAP 07/12/16 - ono RA  10/17/16  desat < 88% x 114 min so rec 2lpm hs to continue  -12/24/2017 referred for Best fit for 02 > done 01/02/18 did not qualify for POC  > desat to 84 on setting of 6 pulsed     As of 04/22/2018  On 3lpm with walking o/w 2lpm 24/7

## 2018-04-24 ENCOUNTER — Encounter: Payer: Self-pay | Admitting: Internal Medicine

## 2018-04-24 ENCOUNTER — Ambulatory Visit: Payer: Federal, State, Local not specified - PPO | Admitting: Internal Medicine

## 2018-04-24 VITALS — BP 120/60 | HR 62 | Temp 97.9°F

## 2018-04-24 DIAGNOSIS — J9611 Chronic respiratory failure with hypoxia: Secondary | ICD-10-CM | POA: Diagnosis not present

## 2018-04-24 DIAGNOSIS — J449 Chronic obstructive pulmonary disease, unspecified: Secondary | ICD-10-CM

## 2018-04-24 DIAGNOSIS — Z8781 Personal history of (healed) traumatic fracture: Secondary | ICD-10-CM | POA: Diagnosis not present

## 2018-04-24 NOTE — Progress Notes (Signed)
Subjective:    Patient ID: Sharon [name], female    DOB: 10-05-1923, 84 y.o.   MRN: [id number]  HPI  84 year old patient who has a history of chronic respiratory failure on chronic home O2. She has had a recent pulmonary follow-up visit. She is recovering from a hospital admission in [place] and is status post ORIF left hip fracture she continues to do well and completed postoperative anticoagulation 6 days ago she continues to have some left leg edema which really has not worsened. Immediately postop she had a brief run of atrial fibrillation and she was discharged on amiodarone. She continues to have home physical therapy and continues to improve  Past Medical History:  Diagnosis Date  . Anemia   . B12 DEFICIENCY 03/31/2008  . COPD 11/27/2008  . ESOPHAGEAL STRICTURE 01/29/2009  . MASS, LUNG 10/13/2009  . NASAL FRACTURE 08/12/2009  . OSTEOARTHRITIS 03/31/2008   Hips  . OSTEOPOROSIS 12/27/2007  . PEDAL EDEMA 12/27/2007  . Pneumonia 07/10/2016  . PULMONARY NODULE 10/21/2009  . RESPIRATORY FAILURE, CHRONIC 06/29/2010  . Shortness of breath dyspnea      Social History   Socioeconomic History  . Marital status: Widowed    Spouse name: Not on file  . Number of children: Not on file  . Years of education: Not on file  . Highest education level: Not on file  Occupational History  . Not on file  Social Needs  . Financial resource strain: Not on file  . Food insecurity:    Worry: Not on file    Inability: Not on file  . Transportation needs:    Medical: Not on file    Non-medical: Not on file  Tobacco Use  . Smoking status: Former Smoker    Packs/day: 0.50    Years: 35.00    Pack years: 17.50    Last attempt to quit: 12/18/1978    Years since quitting: 39.3  . Smokeless tobacco: Never Used  Substance and Sexual Activity  . Alcohol use: Yes    Alcohol/week: 0.0 oz    Comment: couple of gin and tonics daily  . Drug use: No  . Sexual activity: Not on file  Lifestyle  .  Physical activity:    Days per week: Not on file    Minutes per session: Not on file  . Stress: Not on file  Relationships  . Social connections:    Talks on phone: Not on file    Gets together: Not on file    Attends religious service: Not on file    Active member of club or organization: Not on file    Attends meetings of clubs or organizations: Not on file    Relationship status: Not on file  . Intimate partner violence:    Fear of current or ex partner: Not on file    Emotionally abused: Not on file    Physically abused: Not on file    Forced sexual activity: Not on file  Other Topics Concern  . Not on file  Social History Narrative  . Not on file    Past Surgical History:  Procedure Laterality Date  . APPENDECTOMY     '38  . BALLOON DILATION N/A 05/10/2015   Procedure: BALLOON DILATION;  Surgeon: Louis Meckel, MD;  Location: [facility];  Service: Endoscopy;  Laterality: N/A;  . BALLOON DILATION N/A 08/02/2015   Procedure: BALLOON DILATION;  Surgeon: Louis Meckel, MD;  Location: [facility];  Service: Endoscopy;  Laterality: N/A;  . BREAST SURGERY     biopsy '47  . CATARACT EXTRACTION Bilateral    last '05  . CHOLECYSTECTOMY     laparoscopic'10  . ESOPHAGOGASTRODUODENOSCOPY     with dilatation x3  . ESOPHAGOGASTRODUODENOSCOPY (EGD) WITH PROPOFOL N/A 05/10/2015   Procedure: ESOPHAGOGASTRODUODENOSCOPY (EGD) WITH PROPOFOL;  Surgeon: Louis Meckel, MD;  Location: [facility];  Service: Endoscopy;  Laterality: N/A;  balloon dilation  . ESOPHAGOGASTRODUODENOSCOPY (EGD) WITH PROPOFOL N/A 08/02/2015   Procedure: ESOPHAGOGASTRODUODENOSCOPY (EGD) WITH PROPOFOL;  Surgeon: Louis Meckel, MD;  Location: [facility];  Service: Endoscopy;  Laterality: N/A;  . LUMBAR LAMINECTOMY    . NASAL RECONSTRUCTION WITH SEPTAL REPAIR     '10  . RECTAL PROLAPSE REPAIR     '06  . skin graf     '98    Family History  Problem Relation Age of Onset  . Hypertension Son   . Asthma  Mother   . Abdominal Wall Hernia Mother   . Stomach cancer Mother     Allergies  Allergen Reactions  . Penicillins Rash    Has patient had a PCN reaction causing immediate rash, facial/tongue/throat swelling, SOB or lightheadedness with hypotension: Yes Has patient had a PCN reaction causing severe rash involving mucus membranes or skin necrosis: Unknown Has patient had a PCN reaction that required hospitalization: Unknown Has patient had a PCN reaction occurring within the last 10 years: No If all of the above answers are "NO", then may proceed with Cephalosporin use.     Current Outpatient Medications on File Prior to Visit  Medication Sig Dispense Refill  . Cholecalciferol 50000 units TABS Take 5,000 Units by mouth.    . cyanocobalamin (,VITAMIN B-12,) 1000 MCG/ML injection INJECT 1 ML INTO THE MUSCLE ONCE MONTHLY 10 mL 0  . dextromethorphan-guaiFENesin (MUCINEX DM) 30-600 MG 12hr tablet Take 1 tablet by mouth 2 (two) times daily as needed (cough/congestion).    Marland Kitchen escitalopram (LEXAPRO) 5 MG tablet Take 1 tablet (5 mg total) by mouth daily. 90 tablet 0  . famotidine (PEPCID) 20 MG tablet One at bedtime 30 tablet 2  . ipratropium-albuterol (DUONEB) 0.5-2.5 (3) MG/3ML SOLN Take 3 mLs by nebulization every 6 (six) hours as needed. 360 mL 6  . mometasone-formoterol (DULERA) 200-5 MCG/ACT AERO Take 2 puffs first thing in am and then another 2 puffs about 12 hours later. 1 Inhaler 11  . Multiple Vitamins-Minerals (ICAPS AREDS 2) CAPS Take 1 capsule by mouth daily.     . OXYGEN Oxygen 3lpm 24/7    . Polyethyl Glycol-Propyl Glycol (SYSTANE OP) Place 1 drop into both eyes daily.     . Tiotropium Bromide Monohydrate (SPIRIVA RESPIMAT) 2.5 MCG/ACT AERS Inhale 2 puffs into the lungs daily. 1 Inhaler 11  . pantoprazole (PROTONIX) 40 MG tablet Take 1 tablet (40 mg total) by mouth daily. Take 30-60 min before first meal of the day (Patient not taking: Reported on 04/24/2018) 30 tablet 5   No current  facility-administered medications on file prior to visit.     BP 120/60 (BP Location: Left Arm, Patient Position: Sitting, Cuff Size: Normal)   Pulse 62   Temp 97.9 F (36.6 C) (Oral)   SpO2 98%      Review of Systems  Constitutional: Positive for fatigue.  HENT: Negative for congestion, dental problem, hearing loss, rhinorrhea, sinus pressure, sore throat and tinnitus.   Eyes: Negative for pain, discharge and visual disturbance.  Respiratory: Positive for cough and shortness of  breath.   Cardiovascular: Negative for chest pain, palpitations and leg swelling.  Gastrointestinal: Negative for abdominal distention, abdominal pain, blood in stool, constipation, diarrhea, nausea and vomiting.  Genitourinary: Negative for difficulty urinating, dysuria, flank pain, frequency, hematuria, pelvic pain, urgency, vaginal bleeding, vaginal discharge and vaginal pain.  Musculoskeletal: Negative for arthralgias, gait problem and joint swelling.  Skin: Negative for rash.  Neurological: Positive for weakness. Negative for dizziness, syncope, speech difficulty, numbness and headaches.  Hematological: Negative for adenopathy.  Psychiatric/Behavioral: Negative for agitation, behavioral problems and dysphoric mood. The patient is not nervous/anxious.        Objective:   Physical Exam  Constitutional: She is oriented to person, place, and time. She appears well-developed and well-nourished.  HENT:  Head: Normocephalic.  Right Ear: External ear normal.  Left Ear: External ear normal.  Mouth/Throat: Oropharynx is clear and moist.  Eyes: Pupils are equal, round, and reactive to light. Conjunctivae and EOM are normal.  Neck: Normal range of motion. Neck supple. No thyromegaly present.  Cardiovascular: Normal rate, regular rhythm, normal heart sounds and intact distal pulses.  Pulmonary/Chest: Effort normal.  Coarse rhonchi most marked at both bases Kyphosis  Nasal cannula O2 in place  Abdominal:  Soft. Bowel sounds are normal. She exhibits no mass. There is no tenderness.  Musculoskeletal: Normal range of motion. She exhibits edema.   Support stockings in place  swelling involving the left calf  Lymphadenopathy:    She has no cervical adenopathy.  Neurological: She is alert and oriented to person, place, and time.  Skin: Skin is warm and dry. No rash noted.  Psychiatric: She has a normal mood and affect. Her behavior is normal.          Assessment & Plan:   Status post left hip fracture Left leg swelling.  Patient has been off anticoagulation for about 6 days we will check a venous Doppler evaluation COPD/bronchiectasis.  Pulmonary follow-up Deconditioning.  Patient is scheduled for an additional week of in-home physical therapy Brief perioperative atrial fibrillation.  Amiodarone will be discontinued  Follow-up 2 months  Sharon [name]

## 2018-04-24 NOTE — Patient Instructions (Signed)
Limit your sodium (Salt) intake   Continue physical therapy  Venous Doppler study of your left leg as discussed  Return in 2 months for follow-up  Pulmonary follow-up as scheduled

## 2018-04-26 ENCOUNTER — Telehealth: Payer: Self-pay | Admitting: Internal Medicine

## 2018-04-26 ENCOUNTER — Ambulatory Visit (HOSPITAL_COMMUNITY)
Admission: RE | Admit: 2018-04-26 | Discharge: 2018-04-26 | Disposition: A | Discharge status: Home / Self Care | Payer: Medicare Other | Source: Ambulatory Visit | Attending: Vascular Surgery | Admitting: Vascular Surgery

## 2018-04-26 DIAGNOSIS — Z8781 Personal history of (healed) traumatic fracture: Secondary | ICD-10-CM | POA: Insufficient documentation

## 2018-04-26 DIAGNOSIS — M7989 Other specified soft tissue disorders: Secondary | ICD-10-CM | POA: Insufficient documentation

## 2018-04-26 NOTE — Telephone Encounter (Signed)
Sharon [name] with Cardiovascular imaging called report of Ultrasound lower extremities negative for DVT, result read back and verified.

## 2018-04-29 NOTE — Telephone Encounter (Signed)
Noted  

## 2018-05-02 ENCOUNTER — Telehealth: Payer: Self-pay | Admitting: Internal Medicine

## 2018-05-02 NOTE — Telephone Encounter (Signed)
Okay for verbal 

## 2018-05-02 NOTE — Telephone Encounter (Signed)
Okay for Pt   

## 2018-05-02 NOTE — Telephone Encounter (Unsigned)
Copied from CRM [id number]. Topic: Quick Communication - See Telephone Encounter >> May 02, 2018  4:23 PM Floria Raveling A wrote: CRM for notification. See Telephone encounter for: 05/02/18. Pete with [facility] [phone] They want to extend PT  2 week 2 1 week 1  Need verbals

## 2018-05-03 NOTE — Telephone Encounter (Signed)
Pete notified of verbal!

## 2018-05-03 NOTE — Telephone Encounter (Signed)
Okay for PT

## 2018-05-20 ENCOUNTER — Ambulatory Visit (INDEPENDENT_AMBULATORY_CARE_PROVIDER_SITE_OTHER): Payer: Federal, State, Local not specified - PPO | Admitting: Adult Health

## 2018-05-20 ENCOUNTER — Encounter: Payer: Self-pay | Admitting: Adult Health

## 2018-05-20 DIAGNOSIS — J449 Chronic obstructive pulmonary disease, unspecified: Secondary | ICD-10-CM | POA: Diagnosis not present

## 2018-05-20 DIAGNOSIS — J9611 Chronic respiratory failure with hypoxia: Secondary | ICD-10-CM | POA: Diagnosis not present

## 2018-05-20 DIAGNOSIS — E46 Unspecified protein-calorie malnutrition: Secondary | ICD-10-CM | POA: Diagnosis not present

## 2018-05-20 NOTE — Progress Notes (Signed)
Chart and office note reviewed in detail  > agree with a/p as outlined    

## 2018-05-20 NOTE — Patient Instructions (Addendum)
Continue on Spiriva daily .  Continue on Oxygen 2l/m .  Follow up with Dr. Sherene SiresWert  In 4 months and As needed

## 2018-05-20 NOTE — Assessment & Plan Note (Signed)
Continue on oxygen goal is to keep O2 saturations greater than 88 to 90% 

## 2018-05-20 NOTE — Assessment & Plan Note (Signed)
Controlled on current regimen.  Increased symptom control on Spiriva  Plan Patient Instructions  Continue on Spiriva daily .  Continue on Oxygen 2l/m .  Follow up with Dr. Sherene SiresWert  In 4 months and As needed

## 2018-05-20 NOTE — Assessment & Plan Note (Signed)
Discussion on high-protein diet.

## 2018-05-20 NOTE — Progress Notes (Signed)
 @Patient  ID: Sharon AngerJoanne E [name], female    DOB: 01/16/1923, 84 y.o.   MRN: [id number]  Chief Complaint  Patient presents with  . Follow-up    COPD    Referring provider: Gordy SaversKwiatkowski, Peter F, MD  HPI: 84 yo female former smoker followed for GOLD III COPD/ emphysema, Bronchiectasis , Mild chronic nodular infiltrates dating back to 2009.  CAP 07/2016 w/ parapneumonic effusion s/p tap  05/20/2018 Follow up : COPD, bronchiectasis Patient presents for a one-month follow-up.Says over all breathing is doing okay  . Last visit was changed to Spiriva .daily. Says she likes this . Has less dry mouth .  Doing PT at home .  She is trying to get stronger. Using O2 2l/m .  Has a portable concentrator.  This helps her to be more independent   Pneumovax and Prevnar vaccines are up-to-date   chest x-ray January 2019 showed COPD changes        Allergies  Allergen Reactions  . Penicillins Rash    Has patient had a PCN reaction causing immediate rash, facial/tongue/throat swelling, SOB or lightheadedness with hypotension: Yes Has patient had a PCN reaction causing severe rash involving mucus membranes or skin necrosis: Unknown Has patient had a PCN reaction that required hospitalization: Unknown Has patient had a PCN reaction occurring within the last 10 years: No If all of the above answers are "NO", then may proceed with Cephalosporin use.     Immunization History  Administered Date(s) Administered  . Influenza Split 09/26/2011, 09/27/2012  . Influenza Whole 09/30/2009  . Influenza, High Dose Seasonal PF 09/24/2013, 01/10/2016, 09/21/2016, 09/25/2017  . Influenza-Unspecified 10/13/2014  . Pneumococcal Conjugate-13 02/11/2014  . Pneumococcal Polysaccharide-23 05/05/2009  . Td 05/05/2009    Past Medical History:  Diagnosis Date  . Anemia   . B12 DEFICIENCY 03/31/2008  . COPD 11/27/2008  . ESOPHAGEAL STRICTURE 01/29/2009  . MASS, LUNG 10/13/2009  . NASAL FRACTURE 08/12/2009  .  OSTEOARTHRITIS 03/31/2008   Hips  . OSTEOPOROSIS 12/27/2007  . PEDAL EDEMA 12/27/2007  . Pneumonia 07/10/2016  . PULMONARY NODULE 10/21/2009  . RESPIRATORY FAILURE, CHRONIC 06/29/2010  . Shortness of breath dyspnea     Tobacco History: Social History   Tobacco Use  Smoking Status Former Smoker  . Packs/day: 0.50  . Years: 35.00  . Pack years: 17.50  . Last attempt to quit: 12/18/1978  . Years since quitting: 39.4  Smokeless Tobacco Never Used   Counseling given: Not Answered   Outpatient Encounter Medications as of 05/20/2018  Medication Sig  . Calcium-Vitamin D-Vitamin K (VIACTIV) 500-500-40 MG-UNT-MCG CHEW Chew by mouth daily.  . Cholecalciferol 50000 units TABS Take 5,000 Units by mouth.  . cyanocobalamin (,VITAMIN B-12,) 1000 MCG/ML injection INJECT 1 ML INTO THE MUSCLE ONCE MONTHLY  . dextromethorphan-guaiFENesin (MUCINEX DM) 30-600 MG 12hr tablet Take 1 tablet by mouth 2 (two) times daily as needed (cough/congestion).  Marland Kitchen. escitalopram (LEXAPRO) 5 MG tablet Take 1 tablet (5 mg total) by mouth daily.  . famotidine (PEPCID) 20 MG tablet One at bedtime  . ipratropium-albuterol (DUONEB) 0.5-2.5 (3) MG/3ML SOLN Take 3 mLs by nebulization every 6 (six) hours as needed.  . mirtazapine (REMERON) 15 MG tablet Take 15 mg by mouth at bedtime.  . mometasone-formoterol (DULERA) 200-5 MCG/ACT AERO Take 2 puffs first thing in am and then another 2 puffs about 12 hours later.  . Multiple Vitamins-Minerals (ICAPS AREDS 2) CAPS Take 1 capsule by mouth daily.   . OXYGEN Oxygen 3lpm 24/7  .  Polyethyl Glycol-Propyl Glycol (SYSTANE OP) Place 1 drop into both eyes daily.   . Tiotropium Bromide Monohydrate (SPIRIVA RESPIMAT) 2.5 MCG/ACT AERS Inhale 2 puffs into the lungs daily.  . pantoprazole (PROTONIX) 40 MG tablet Take 1 tablet (40 mg total) by mouth daily. Take 30-60 min before first meal of the day (Patient not taking: Reported on 04/24/2018)   No facility-administered encounter medications on file as  of 05/20/2018.      Review of Systems  Constitutional:   No  weight loss, night sweats,  Fevers, chills,  +fatigue, or  lassitude.  HEENT:   No headaches,  Difficulty swallowing,  Tooth/dental problems, or  Sore throat,                No sneezing, itching, ear ache, nasal congestion, post nasal drip,   CV:  No chest pain,  Orthopnea, PND, swelling in lower extremities, anasarca, dizziness, palpitations, syncope.   GI  No heartburn, indigestion, abdominal pain, nausea, vomiting, diarrhea, change in bowel habits, loss of appetite, bloody stools.   Resp:  No chest wall deformity  Skin: no rash or lesions.  GU: no dysuria, change in color of urine, no urgency or frequency.  No flank pain, no hematuria   MS:  No joint pain or swelling.  No decreased range of motion.  No back pain.    Physical Exam  BP 114/60 (BP Location: Left Arm, Cuff Size: Small)   Pulse 71   Ht 5\' 5"  (1.651 m)   Wt 98 lb 3.2 oz (44.5 kg)   SpO2 94%   BMI 16.34 kg/m   GEN: A/Ox3; pleasant , NAD, thin, frail, on oxygen in wheelchair   HEENT:  [place]/AT,  EACs-clear, TMs-wnl, NOSE-clear, THROAT-clear, no lesions, no postnasal drip or exudate noted.   NECK:  Supple w/ fair ROM; no JVD; normal carotid impulses w/o bruits; no thyromegaly or nodules palpated; no lymphadenopathy.    RESP decreased breath sounds in the bases ,, no wheezing.  No accessory muscle use, no dullness to percussion  CARD:  RRR, no m/r/g, no peripheral edema, pulses intact, no cyanosis or clubbing.  GI:   Soft & nt; nml bowel sounds; no organomegaly or masses detected.   Musco: Warm bil, no deformities or joint swelling noted.   Neuro: alert, no focal deficits noted.    Skin: Warm, (facial rash from prescription cream)    Lab Results:  CBC  No results found for: BNP  ProBNP    Component Value Date/Time   PROBNP 67.0 07/18/2017 1241    Imaging: No results found.   Assessment & Plan:   COPD GOLD III  Controlled on  current regimen.  Increased symptom control on Spiriva  Plan Patient Instructions  Continue on Spiriva daily .  Continue on Oxygen 2l/m .  Follow up with Dr. Sherene Sires  In 4 months and As needed          Chronic respiratory failure with hypoxia (HCC) Continue on oxygen goal is to keep O2 saturations greater than 88 to 90%.  Protein calorie malnutrition (HCC) Discussion on high-protein diet.     Rubye Oaks, NP 05/20/2018

## 2018-06-26 ENCOUNTER — Encounter: Payer: Self-pay | Admitting: Internal Medicine

## 2018-06-26 ENCOUNTER — Ambulatory Visit: Payer: Federal, State, Local not specified - PPO | Admitting: Internal Medicine

## 2018-06-26 VITALS — BP 160/98 | HR 72 | Temp 97.0°F | Wt 97.0 lb

## 2018-06-26 DIAGNOSIS — I480 Paroxysmal atrial fibrillation: Secondary | ICD-10-CM

## 2018-06-26 DIAGNOSIS — J479 Bronchiectasis, uncomplicated: Secondary | ICD-10-CM | POA: Diagnosis not present

## 2018-06-26 DIAGNOSIS — J9611 Chronic respiratory failure with hypoxia: Secondary | ICD-10-CM

## 2018-06-26 MED ORDER — IPRATROPIUM-ALBUTEROL 0.5-2.5 (3) MG/3ML IN SOLN: 3.0000 mL | Freq: Four times a day (QID) | RESPIRATORY_TRACT | 6 refills | Status: DC | PRN

## 2018-06-26 MED ORDER — MIRTAZAPINE 15 MG PO TABS: 15.0000 mg | ORAL_TABLET | Freq: Every day | ORAL | 3 refills | Status: DC

## 2018-06-26 MED ORDER — MOMETASONE FURO-FORMOTEROL FUM 200-5 MCG/ACT IN AERO: INHALATION_SPRAY | RESPIRATORY_TRACT | 11 refills | Status: DC

## 2018-06-26 MED ORDER — TIOTROPIUM BROMIDE MONOHYDRATE 2.5 MCG/ACT IN AERS: 2.0000 | INHALATION_SPRAY | Freq: Every day | RESPIRATORY_TRACT | 11 refills | Status: DC

## 2018-06-26 MED ORDER — ESCITALOPRAM OXALATE 5 MG PO TABS: 5.0000 mg | ORAL_TABLET | Freq: Every day | ORAL | 3 refills | Status: DC

## 2018-06-26 NOTE — Patient Instructions (Signed)
Limit your sodium (Salt) intake  Return in 3 to 4 months for follow-up  Pulmonary follow-up as scheduled

## 2018-06-26 NOTE — Progress Notes (Signed)
Subjective:    Patient ID: Sharon AngerJoanne E [name], female    DOB: Jul 31, 1923, 84 y.o.   MRN: [id number]  HPI  84 year old patient in today for follow-up.  She has a history of COPD Gold due to chronicIII.  She remains on chronic O2 hypoxic respiratory failure.  She is doing fairly well.  Accompanied by family today She is scheduled for pulmonary follow-up in a couple of months  Past Medical History:  Diagnosis Date  . Anemia   . B12 DEFICIENCY 03/31/2008  . COPD 11/27/2008  . ESOPHAGEAL STRICTURE 01/29/2009  . MASS, LUNG 10/13/2009  . NASAL FRACTURE 08/12/2009  . OSTEOARTHRITIS 03/31/2008   Hips  . OSTEOPOROSIS 12/27/2007  . PEDAL EDEMA 12/27/2007  . Pneumonia 07/10/2016  . PULMONARY NODULE 10/21/2009  . RESPIRATORY FAILURE, CHRONIC 06/29/2010  . Shortness of breath dyspnea      Social History   Socioeconomic History  . Marital status: Widowed    Spouse name: Not on file  . Number of children: Not on file  . Years of education: Not on file  . Highest education level: Not on file  Occupational History  . Not on file  Social Needs  . Financial resource strain: Not on file  . Food insecurity:    Worry: Not on file    Inability: Not on file  . Transportation needs:    Medical: Not on file    Non-medical: Not on file  Tobacco Use  . Smoking status: Former Smoker    Packs/day: 0.50    Years: 35.00    Pack years: 17.50    Last attempt to quit: 12/18/1978    Years since quitting: 39.5  . Smokeless tobacco: Never Used  Substance and Sexual Activity  . Alcohol use: Yes    Alcohol/week: 0.0 oz    Comment: couple of gin and tonics daily  . Drug use: No  . Sexual activity: Not on file  Lifestyle  . Physical activity:    Days per week: Not on file    Minutes per session: Not on file  . Stress: Not on file  Relationships  . Social connections:    Talks on phone: Not on file    Gets together: Not on file    Attends religious service: Not on file    Active member of club or  organization: Not on file    Attends meetings of clubs or organizations: Not on file    Relationship status: Not on file  . Intimate partner violence:    Fear of current or ex partner: Not on file    Emotionally abused: Not on file    Physically abused: Not on file    Forced sexual activity: Not on file  Other Topics Concern  . Not on file  Social History Narrative  . Not on file    Past Surgical History:  Procedure Laterality Date  . APPENDECTOMY     '38  . BALLOON DILATION N/A 05/10/2015   Procedure: BALLOON DILATION;  Surgeon: Louis Meckelobert D Kaplan, MD;  [facility];  Service: Endoscopy;  Laterality: N/A;  . BALLOON DILATION N/A 08/02/2015   Procedure: BALLOON DILATION;  Surgeon: Louis Meckelobert D Kaplan, MD;  Location: [facility];  Service: Endoscopy;  Laterality: N/A;  . BREAST SURGERY     biopsy '47  . CATARACT EXTRACTION Bilateral    last '05  . CHOLECYSTECTOMY     laparoscopic'10  . ESOPHAGOGASTRODUODENOSCOPY     with dilatation x3  . ESOPHAGOGASTRODUODENOSCOPY (  EGD) WITH PROPOFOL N/A 05/10/2015   Procedure: ESOPHAGOGASTRODUODENOSCOPY (EGD) WITH PROPOFOL;  Surgeon: Louis Meckel, MD;  Location: [facility];  Service: Endoscopy;  Laterality: N/A;  balloon dilation  . ESOPHAGOGASTRODUODENOSCOPY (EGD) WITH PROPOFOL N/A 08/02/2015   Procedure: ESOPHAGOGASTRODUODENOSCOPY (EGD) WITH PROPOFOL;  Surgeon: Louis Meckel, MD;  Location: [facility];  Service: Endoscopy;  Laterality: N/A;  . LUMBAR LAMINECTOMY    . NASAL RECONSTRUCTION WITH SEPTAL REPAIR     '10  . RECTAL PROLAPSE REPAIR     '06  . skin graf     '98    Family History  Problem Relation Age of Onset  . Hypertension Son   . Asthma Mother   . Abdominal Wall Hernia Mother   . Stomach cancer Mother     Allergies  Allergen Reactions  . Penicillins Rash    Has patient had a PCN reaction causing immediate rash, facial/tongue/throat swelling, SOB or lightheadedness with hypotension: Yes Has patient had a PCN  reaction causing severe rash involving mucus membranes or skin necrosis: Unknown Has patient had a PCN reaction that required hospitalization: Unknown Has patient had a PCN reaction occurring within the last 10 years: No If all of the above answers are "NO", then may proceed with Cephalosporin use.     Current Outpatient Medications on File Prior to Visit  Medication Sig Dispense Refill  . Calcium-Vitamin D-Vitamin K (VIACTIV) 500-500-40 MG-UNT-MCG CHEW Chew by mouth daily.    . Cholecalciferol 50000 units TABS Take 5,000 Units by mouth.    . cyanocobalamin (,VITAMIN B-12,) 1000 MCG/ML injection INJECT 1 ML INTO THE MUSCLE ONCE MONTHLY 10 mL 0  . dextromethorphan-guaiFENesin (MUCINEX DM) 30-600 MG 12hr tablet Take 1 tablet by mouth 2 (two) times daily as needed (cough/congestion).    . famotidine (PEPCID) 20 MG tablet One at bedtime 30 tablet 2  . Multiple Vitamins-Minerals (ICAPS AREDS 2) CAPS Take 1 capsule by mouth daily.     . OXYGEN Oxygen 3lpm 24/7    . Polyethyl Glycol-Propyl Glycol (SYSTANE OP) Place 1 drop into both eyes daily.      No current facility-administered medications on file prior to visit.     BP (!) 160/98 (BP Location: Left Arm, Patient Position: Sitting, Cuff Size: Small)   Pulse 72   Temp (!) 97 F (36.1 C) (Oral)   Wt 97 lb (44 kg)   SpO2 97%   BMI 16.14 kg/m     Review of Systems  Constitutional: Positive for fatigue.  HENT: Negative for congestion, dental problem, hearing loss, rhinorrhea, sinus pressure, sore throat and tinnitus.   Eyes: Negative for pain, discharge and visual disturbance.  Respiratory: Positive for cough. Negative for shortness of breath.   Cardiovascular: Positive for leg swelling. Negative for chest pain and palpitations.  Gastrointestinal: Negative for abdominal distention, abdominal pain, blood in stool, constipation, diarrhea, nausea and vomiting.  Genitourinary: Negative for difficulty urinating, dysuria, flank pain,  frequency, hematuria, pelvic pain, urgency, vaginal bleeding, vaginal discharge and vaginal pain.  Musculoskeletal: Negative for arthralgias, gait problem and joint swelling.  Skin: Negative for rash.  Neurological: Negative for dizziness, syncope, speech difficulty, weakness, numbness and headaches.  Hematological: Negative for adenopathy. Bruises/bleeds easily.  Psychiatric/Behavioral: Negative for agitation, behavioral problems and dysphoric mood. The patient is not nervous/anxious.        Objective:   Physical Exam  Constitutional: She is oriented to person, place, and time. She appears well-developed and well-nourished. No distress.  Elderly frail no acute distress  Repeat blood pressure 140/80 Nasal cannula oxygen in place  Weight 97 pounds  HENT:  Head: Normocephalic.  Right Ear: External ear normal.  Left Ear: External ear normal.  Mouth/Throat: Oropharynx is clear and moist.  Eyes: Pupils are equal, round, and reactive to light. Conjunctivae and EOM are normal.  Neck: Normal range of motion. Neck supple. No thyromegaly present.  Cardiovascular: Normal rate, regular rhythm, normal heart sounds and intact distal pulses.  Pulmonary/Chest: Effort normal.  Diminished breath sounds.  A few scattered coarse rhonchi loudest at the bases  Abdominal: Soft. Bowel sounds are normal. She exhibits no mass. There is no tenderness.  Musculoskeletal: Normal range of motion. She exhibits edema.  Mild left ankle and pedal edema  Lymphadenopathy:    She has no cervical adenopathy.  Neurological: She is alert and oriented to person, place, and time.  Skin: Skin is warm and dry. No rash noted.  Psychiatric: She has a normal mood and affect. Her behavior is normal.          Assessment & Plan:   COPD Gold III Chronic hypoxic respiratory failure  Continue present bronchodilators.  Follow-up pulmonary medicine History depression stable  Patient will follow up with a new provider in 3  to 4 months All medications refilled  Gordy Savers

## 2018-07-05 ENCOUNTER — Encounter: Payer: Self-pay | Admitting: Internal Medicine

## 2018-08-11 ENCOUNTER — Other Ambulatory Visit: Payer: Self-pay | Admitting: Internal Medicine

## 2018-09-05 ENCOUNTER — Other Ambulatory Visit: Payer: Self-pay | Admitting: Internal Medicine

## 2018-09-10 ENCOUNTER — Telehealth: Payer: Self-pay | Admitting: Internal Medicine

## 2018-09-10 NOTE — Telephone Encounter (Signed)
Called Sharon LamerKathleen [name] and advised her that we would need the summons letter before we would be able to preceded with this. Nothing further needed at this time.

## 2018-09-11 ENCOUNTER — Telehealth: Payer: Self-pay | Admitting: Internal Medicine

## 2018-09-11 ENCOUNTER — Encounter: Payer: Self-pay | Admitting: *Deleted

## 2018-09-11 NOTE — Telephone Encounter (Signed)
Letter done and up front to pick up  Sharon [name] made aware

## 2018-09-11 NOTE — Telephone Encounter (Addendum)
Spoke with pt's daughter in law, Nicholos Johns in the lobby. Nicholos Johns has been summoned for jury duty. She is the pt's sole caregiver and will not be able to go for jury duty. She is requesting to have a letter from Dr. Sherene Sires to excuse her from this.  MW - please advise. Thanks.  **Jury summons has been placed in Dr. Thurston Hole look at cubby above Leslie's desk.**

## 2018-09-11 NOTE — Telephone Encounter (Addendum)
Pt does not have a current PCP per the pt's daughter in law. Letter has been written and placed in MW's look at above his desk. I have paper clipped the pt's daughter in law's jury summons to this letter. Will route to [place] for follow up.

## 2018-09-11 NOTE — Telephone Encounter (Signed)
Ok to write letter if can't contact current pcp (was Dr Kirtland BouchardK but he might be retired) but that should be the job of her pcp as she doesn't need her daughter for copd management per se

## 2018-09-18 ENCOUNTER — Encounter: Payer: Self-pay | Admitting: Internal Medicine

## 2018-09-18 ENCOUNTER — Ambulatory Visit (INDEPENDENT_AMBULATORY_CARE_PROVIDER_SITE_OTHER)
Admission: RE | Admit: 2018-09-18 | Discharge: 2018-09-18 | Disposition: A | Discharge status: Home / Self Care | Payer: Federal, State, Local not specified - PPO | Source: Ambulatory Visit | Attending: Internal Medicine | Admitting: Internal Medicine

## 2018-09-18 ENCOUNTER — Ambulatory Visit: Payer: Federal, State, Local not specified - PPO | Admitting: Internal Medicine

## 2018-09-18 ENCOUNTER — Telehealth: Payer: Self-pay | Admitting: Internal Medicine

## 2018-09-18 VITALS — BP 126/86 | HR 82 | Temp 97.7°F | Ht 65.0 in | Wt 98.2 lb

## 2018-09-18 DIAGNOSIS — J449 Chronic obstructive pulmonary disease, unspecified: Secondary | ICD-10-CM

## 2018-09-18 DIAGNOSIS — J9611 Chronic respiratory failure with hypoxia: Secondary | ICD-10-CM

## 2018-09-18 DIAGNOSIS — Z23 Encounter for immunization: Secondary | ICD-10-CM

## 2018-09-18 MED ORDER — ARFORMOTEROL TARTRATE 15 MCG/2ML IN NEBU: 15.0000 ug | INHALATION_SOLUTION | Freq: Two times a day (BID) | RESPIRATORY_TRACT | 11 refills | Status: DC

## 2018-09-18 MED ORDER — ARFORMOTEROL TARTRATE 15 MCG/2ML IN NEBU: 15.0000 ug | INHALATION_SOLUTION | Freq: Two times a day (BID) | RESPIRATORY_TRACT | 0 refills | Status: DC

## 2018-09-18 MED ORDER — REVEFENACIN 175 MCG/3ML IN SOLN: 1.0000 | Freq: Every day | RESPIRATORY_TRACT | 11 refills | Status: DC

## 2018-09-18 MED ORDER — REVEFENACIN 175 MCG/3ML IN SOLN: 1.0000 | Freq: Every day | RESPIRATORY_TRACT | 0 refills | Status: DC

## 2018-09-18 MED ORDER — ALBUTEROL SULFATE (2.5 MG/3ML) 0.083% IN NEBU: 2.5000 mg | INHALATION_SOLUTION | Freq: Four times a day (QID) | RESPIRATORY_TRACT | 2 refills | Status: DC | PRN

## 2018-09-18 NOTE — Progress Notes (Signed)
Subjective:    Patient ID: Sharon [name], female    DOB: 07/23/1923,    MRN: [id number]   Brief patient profile:  89    yowf quit smoking in 1980 with no minimal chronic doe assoc mild chronic nodular infiltrates dating back to 11/23/2008 with documented GOLD III criteria copd/ bronchiectasis by CT 09/2009 both lower lobes       Admit date: 07/09/2016 Discharge date: 07/12/2016  Discharge Diagnoses:  Principal Problem:   CAP (community acquired pneumonia)   Depression   Community acquired pneumonia   COPD (chronic obstructive pulmonary disease) with emphysema (HCC)   Sepsis (HCC)   Acute respiratory failure with hypoxia (HCC)   Hyponatremia      08/16/2016 1st [facility] office visit/Epic era/ Sharon [name]  Re s/p CAP in pt with bronchiectasis/ GOLD III COPD  Chief Complaint  Patient presents with  . Pulmonary Consult    Referred by Dr. Eleonore Chiquito for pleural effusion. Pt states that she has been dxed with PNA x 2 since July 2017. She has had cough and SOB. Cough is currently non prod.   baseline = MMRC2 = can't walk a nl pace on a flat grade s sob but does fine slow and flat eg [employer] shopping/ uses [facility] parking on [address] 160 2bid and rare need for combivent  Acutely ill with hoarseness in [place] p getting off plan from [place] then dry cough and admit: Now on 02 at 3lpm 24/7 rec Please see patient coordinator before you leave today  to schedule venous doppler asap on Left> neg  Left  thoracentesis 08/17/16 =  1 liter on Left  WBC 1,020  L > P  Transudate with nl glucose/ cyt neg       12/24/2017  Extended acute  ov/Sharon [name] re: re-establish   COPD GOLD III with bronchiectasis  And prior tranusdative efffuson  Chief Complaint  Patient presents with  . Acute Visit    SOB with exertion, non productive cough more in the morning and with talking,nasal congestion,left foot swelling  solid food / pill dysphagia Sleeps left side down horizontally  Worse doe x 07/2017 rx   dulera 200 2bid helped some but still needing combivent maybe once or whice a day but not noct  Doe now = MMRC3 = can't walk 100 yards even at a slow pace at a flat grade s stopping due to sob  ? On approp 02?  rec Work on inhaler technique:  relax and gently blow all the way out then take a nice smooth deep breath back in, triggering the inhaler at same time you start breathing in.  Hold for up to 5 seconds if you can. Blow out thru nose. Rinse and gargle with water when done. Prednisone 10 mg take  4 each am x 2 days,   2 each am x 2 days,  1 each am x 2 days and stop  Pantoprazole (protonix) 40 mg   Take  30-60 min before first meal of the day and Pepcid (famotidine)  20 mg one @  bedtime until return to office - this is the best way to tell whether stomach acid is contributing to your problem.   GERD   We will have [facility] do a best fit evaluation for your 02 needs     04/22/2018  f/u ov/Sharon [name] re:   Copd III / bronchiectasis / on amiodarone now  Chief Complaint  Patient presents with  . Follow-up    Breathing is  about the same. She has good and bad days.    Dyspnea:  50 ft on 3lpm / 2sitting / sleeping Cough: green mucus since admit but improving Sleep: horizontal SABA use:  Just using duoneb  Back at home p hip fx in [place]  / Dr Kirtland Bouchard primary Dry mouth from spiriva dpi  rec Follow med calendar     09/18/2018  f/u ov/Sharon [name] re:  GOLD III/ bronchiectasis, no longer able to use devices  Chief Complaint  Patient presents with  . Follow-up    Pt states she tires easily. She has not been walking much at all for the past month. She has had prod cough with green sputum for several months.   Dyspnea:  Across the room on up to 4lpm Cough: worse x several months, on doxy for skin infection Sleeping: bed flat, on back 2 pillows SABA use: neb every few days 02: 2lpm at rest, 4lpm with activity     No obvious day to day or daytime variability or assoc  mucus plugs or hemoptysis or cp or chest  tightness, subjective wheeze or overt sinus or hb symptoms.   Sleeping as above  without nocturnal  or early am exacerbation  of respiratory  c/o's or need for noct saba. Also denies any obvious fluctuation of symptoms with weather or environmental changes or other aggravating or alleviating factors except as outlined above   No unusual exposure hx or h/o childhood pna/ asthma or knowledge of premature birth.  Current Allergies, Complete Past Medical History, Past Surgical History, Family History, and Social History were reviewed in [employer] record.  ROS  The following are not active complaints unless bolded Hoarseness, sore throat, dysphagia, dental problems, itching, sneezing,  nasal congestion or discharge of excess mucus or purulent secretions, ear ache,   fever, chills, sweats, unintended wt loss or wt gain, classically pleuritic or exertional cp,  orthopnea pnd or arm/hand swelling  or leg swelling, presyncope, palpitations, abdominal pain, anorexia, nausea, vomiting, diarrhea  or change in bowel habits or change in bladder habits, change in stools or change in urine, dysuria, hematuria,  rash, arthralgias, visual complaints, headache, numbness, weakness or ataxia or problems with walking or coordination,  change in mood or  memory.        Current Meds  Medication Sig  . Acetaminophen (TYLENOL) 325 MG CAPS Take 1 capsule by mouth every 6 (six) hours as needed.  . Calcium-Vitamin D-Vitamin K (VIACTIV) 500-500-40 MG-UNT-MCG CHEW Chew by mouth daily.  . Cholecalciferol 50000 units TABS Take 5,000 Units by mouth.  . cyanocobalamin (,VITAMIN B-12,) 1000 MCG/ML injection INJECT 1 ML INTO THE MUSCLE ONCE MONTHLY  . dextromethorphan-guaiFENesin (MUCINEX DM) 30-600 MG 12hr tablet Take 1 tablet by mouth 2 (two) times daily as needed (cough/congestion).  Marland Kitchen doxycycline (VIBRAMYCIN) 100 MG capsule Take 100 mg by mouth 2 (two) times daily.  Marland Kitchen escitalopram (LEXAPRO) 5 MG tablet  Take 1 tablet (5 mg total) by mouth daily.  . famotidine (PEPCID) 20 MG tablet One at bedtime  . mirtazapine (REMERON) 15 MG tablet Take 1 tablet (15 mg total) by mouth at bedtime.  . Multiple Vitamins-Minerals (ICAPS AREDS 2) CAPS Take 1 capsule by mouth daily.   . Naproxen Sodium (ALEVE) 220 MG CAPS Take by mouth 2 (two) times daily as needed.  . OXYGEN Oxygen 2lpm 24/7  . Polyethyl Glycol-Propyl Glycol (SYSTANE OP) Place 1 drop into both eyes daily.   Marland Kitchen senna-docusate (SENOKOT-S) 8.6-50 MG tablet Take  1 tablet by mouth at bedtime as needed for mild constipation.  Marland Kitchen   ipratropium-albuterol (DUONEB) 0.5-2.5 (3) MG/3ML SOLN Take 3 mLs by nebulization every 6 (six) hours as needed.  . [  mometasone-formoterol (DULERA) 200-5 MCG/ACT AERO Take 2 puffs first thing in am and then another 2 puffs about 12 hours later.  . [  Tiotropium Bromide Monohydrate (SPIRIVA RESPIMAT) 2.5 MCG/ACT AERS Inhale 2 puffs into the lungs daily.                  Past Medical History: Osteoporosis B12 deficiency Osteoarthritis COPD    - PFT's December 03, 2009 FEV1 67 (40%) with 12% response to B2 and DLC0 55%    - HFA 25% June 29, 2010 > 75% August 11, 2010 > 75% November 03, 2010  Fx Right Hip and required ORIF June 19th 2011> 02 dependent since > d/c November 03, 2010  history of esophageal stricture...........................................Marland KitchenPatterson Multiple pulmonary nodules..................................................Marland KitchenWert    -CT chest 10/13/09 assoc with bilateral lower lobe bronchiectasis    -CXR December 03, 2009 no change  > no change June 29, 2010            Objective:   Physical Exam   slt hoarse wf nad w/c bound  09/18/2018        98  04/22/2018           96  12/24/2017           96   08/16/16 102 lb (46.3 kg)  08/15/16 101 lb 4 oz (45.9 kg)  07/28/16 99 lb 12.8 oz (45.3 kg)     Vital signs reviewed - Note on arrival 02 sats  95% on 2lpm        HEENT: nl dentition /  oropharynx. Nl external ear canals without cough reflex -  Mild bilateral non-specific turbinate edema     NECK :  without JVD/Nodes/TM/ nl carotid upstrokes bilaterally   LUNGS: no acc muscle use,  Mild barrel  contour chest wall with bilateral exp rhonchi and  without cough on insp or exp maneuver and mild  Hyperresonant  to  percussion bilaterally     CV:  RRR  no s3 or murmur or increase in P2, and no edema   ABD:  soft and nontender  . No bruits or organomegaly appreciated, bowel sounds nl  MS:     ext warm without deformities though there is gen muscle atrophy, calf tenderness, cyanosis or clubbing No obvious joint restrictions   SKIN: warm and dry without lesions    NEURO:  alert, approp, nl sensorium with  no motor or cerebellar deficits apparent.           CXR PA and Lateral:   09/18/2018 :    I personally reviewed images and agree with radiology impression as follows:   No pneumothorax. Emphysematous changes in the lungs. A nodular density projected over the right base may represent a nipple shadow. Mild atelectasis in the left base. No other acute abnormalities.                 Assessment & Plan:

## 2018-09-18 NOTE — Telephone Encounter (Signed)
Called and spoke to pt. Pt stated that per [facility], insurance will only pick up 40 and 50% of yupelri and Brovana cost. I have made pt aware that we could attempt to send Rx to [facility] in hopes of cheaper copay.  Pt agreed with this plan.  Rx for Mikael Spray and Rosalyn Gess has been printed and given to DME to be faxed to [facility]. Nothing further is needed.

## 2018-09-18 NOTE — Patient Instructions (Addendum)
Plan A = Automatic = Brovana  And Yupelri each am  And in evening @ 12 hours later  just the Brovana     Plan B = Backup only use your albuterol nebulizer if you first try Plan B and it fails to help > ok to use the nebulizer up to every 4 hours but if start needing it regularly call for immediate appointment   Finish your antibotics    For cough > max dose of mucinex or mucinex is 1200 mg every 12 hours   Please see patient coordinator before you leave today  to schedule home pulmonary rehab per [facility]    Please remember to go to the  x-ray department downstairs in the basement  for your tests - we will call you with the results when they are available.       See Tammy NP w/in 2 weeks with all your medications, even over the counter meds, separated in two separate bags, the ones you take no matter what vs the ones you stop once you feel better and take only as needed when you feel you need them.   Tammy  will generate for you a new user friendly medication calendar that will put [place] all on the same page re: your medication use.     Without this process, it simply isn't possible to assure that we are providing  your outpatient care  with  the attention to detail we feel you deserve.   If we cannot assure that you're getting that kind of care,  then we cannot manage your problem effectively from this clinic.  Once you have seen Tammy and we are sure that we're all on the same page with your medication use she will arrange follow up with me.

## 2018-09-19 ENCOUNTER — Encounter: Payer: Self-pay | Admitting: Internal Medicine

## 2018-09-19 NOTE — Assessment & Plan Note (Signed)
Quit smoking 1980 -  PFT's December 03, 2009 FEV1 67 (40%) with 12% response to B2 and DLC0 55% - 12/24/2017  After extensive coaching inhaler device  effectiveness =    50% from a baseline of 25% - 09/18/2018  After extensive coaching inhaler device,  effectiveness =    25% from a baseline of 0  With spacer  She is really not able to use her devices correctly and should be considered for neb laba/lama/ics if she can acquire thru her present insurance plan which remains to be seen   Try brovana/ yupelri and return in 2 weeks for  Med rec:  To keep things simple, I have asked the patient to first separate medicines that are perceived as maintenance, that is to be taken daily "no matter what", from those medicines that are taken on only on an as-needed basis and I have given the patient examples of both, and then return to see our NP to generate a  detailed  medication calendar which should be followed until the next physician sees the patient and updates it.     I had an extended discussion with the patient, son and daughter in law  reviewing all relevant studies completed to date and  lasting 15 to 20 minutes of a 25 minute visit    See device teaching which extended face to face time for this visit.  Each maintenance medication was reviewed in detail including emphasizing most importantly the difference between maintenance and prns and under what circumstances the prns are to be triggered using an action plan format that is not reflected in the computer generated alphabetically organized AVS which I have not found useful in most complex patients, especially with respiratory illnesses  Please see AVS for specific instructions unique to this visit that I personally wrote and verbalized to the the pt in detail and then reviewed with pt  by my nurse highlighting any  changes in therapy recommended at today's visit to their plan of care.

## 2018-09-19 NOTE — Assessment & Plan Note (Signed)
On 02 2lpm since d/c from CAP 07/12/16 - ono RA  10/17/16  desat < 88% x 114 min so rec 2lpm hs to continue  -12/24/2017 referred for Best fit for 02 > done 01/02/18 did not qualify for POC  > desat to 84 on setting of 6 pulsed     As of 09/18/2018  Up to 4 lpm with walking o/w 2lpm 24/7   Adequate control on present rx, reviewed in detail with pt > no change in rx needed

## 2018-09-19 NOTE — Progress Notes (Signed)
Spoke with pt and notified of results per Dr. Wert. Pt verbalized understanding and denied any questions. 

## 2018-09-25 ENCOUNTER — Telehealth: Payer: Self-pay | Admitting: Internal Medicine

## 2018-09-25 MED ORDER — CEFDINIR 300 MG PO CAPS: 300.0000 mg | ORAL_CAPSULE | Freq: Two times a day (BID) | ORAL | 0 refills | Status: DC

## 2018-09-25 NOTE — Telephone Encounter (Signed)
Rx cefdinir (OMNICEF) 300 MG capsule but pharmacy states is indirect with penicillin per pt's pharmacy  Dr. Sherene Sires, please advise on this for pt. Thanks!

## 2018-09-25 NOTE — Telephone Encounter (Signed)
See previous note - if only reaction to penicillin was a rash, which is what Epic says, then ok to use omnicef  If fm not still not agreeable, ok to use levaquin 500mg  daily x 10 days but it is expensiv and can cause tendonitis so if any aches in joints, esp ankle, she should stop it

## 2018-09-25 NOTE — Telephone Encounter (Signed)
Called and spoke with Rosanne Ashing from PT for orders for PT and also for orders for OT.  Per Rosanne Ashing, order stated PT two times daily x3 weeks and OT was for an evaluation.  Per jim, pt is having a productive cough with green sputum and wheezing, during coughing spells pt's O2 sats have dropped to 80% on 2L O2.  Rosanne Ashing states when pt stands up, systolic drops 20 points but pt reports no dizziness.  Per Rosanne Ashing, pt just had a spot removed off of her leg due to the skin infection but wanted to know if pt should be put on meds due to the green sputum and wheezing.  Called and spoke with pt who states she has been coughing up the green sputum for weeks but states with the doxy pt was on for the leg has not helped the cough and mucus.   Per Olegario Messier, pt's daughter in law, she thinks pt needs to be put on a different abx other than the doxy that she had been placed on for the cancerous spot that was removed off of her leg to see if it would help with the cough with thick green sputum as well as the wheezing.  Dr. Sherene Sires, please verify orders for PT if twice daily for 3 weeks is what you want and also please advise if pt can be placed on a different abx other than doxy to see if it would help pt's symptoms. Thanks!

## 2018-09-25 NOTE — Telephone Encounter (Signed)
Spoke with Sharon [name] and notified of recs per MW  She verbalized understanding  Rx sent to [facility]

## 2018-09-25 NOTE — Telephone Encounter (Signed)
Yes if yellow mucus not better would stop the doxy and try omnicef 300 mg bid x 10 days (pen allergy was just a rash per records so ok to try omnicef but stop if rash <10% likely)  - omnicef will work for skin too  If mucus still yellow p finish omnicef call back

## 2018-09-25 NOTE — Telephone Encounter (Signed)
Called and spoke to pt's daughter, Sharon [name]. Informed her of the recs per MW. Sharon [name] states that the reaction was so long ago and the rash is the only symptom that the pt presented with. Sharon [name] states she will still give the [facility] to the pt and the she is also aware to take pt to ED if pt were to have increase in SOB, dizziness, lethargy, angioedema, hives, other swelling, chest discomfort. Nothing further needed at this time.

## 2018-09-26 ENCOUNTER — Telehealth: Payer: Self-pay | Admitting: Internal Medicine

## 2018-09-26 NOTE — Telephone Encounter (Signed)
Spoke with Rosanne Ashing with [facility]. I have given the verbal order to continue PT. Nothing further was needed.

## 2018-09-30 ENCOUNTER — Ambulatory Visit: Payer: Federal, State, Local not specified - PPO | Admitting: Family Medicine

## 2018-09-30 ENCOUNTER — Encounter: Payer: Self-pay | Admitting: Family Medicine

## 2018-09-30 VITALS — BP 120/80 | Temp 97.6°F | Wt 101.4 lb

## 2018-09-30 DIAGNOSIS — J9611 Chronic respiratory failure with hypoxia: Secondary | ICD-10-CM

## 2018-09-30 DIAGNOSIS — Z9981 Dependence on supplemental oxygen: Secondary | ICD-10-CM | POA: Diagnosis not present

## 2018-09-30 DIAGNOSIS — R5383 Other fatigue: Secondary | ICD-10-CM | POA: Diagnosis not present

## 2018-09-30 DIAGNOSIS — K5900 Constipation, unspecified: Secondary | ICD-10-CM

## 2018-09-30 LAB — CBC WITH DIFFERENTIAL/PLATELET
BASOS PCT: 1.2 % (ref 0.0–3.0)
Basophils Absolute: 0.1 10*3/uL (ref 0.0–0.1)
EOS PCT: 3.7 % (ref 0.0–5.0)
Eosinophils Absolute: 0.4 10*3/uL (ref 0.0–0.7)
HCT: 32.9 % — ABNORMAL LOW (ref 36.0–46.0)
HEMOGLOBIN: 10.9 g/dL — AB (ref 12.0–15.0)
LYMPHS ABS: 2 10*3/uL (ref 0.7–4.0)
Lymphocytes Relative: 19.5 % (ref 12.0–46.0)
MCHC: 33.1 g/dL (ref 30.0–36.0)
MCV: 91.2 fl (ref 78.0–100.0)
MONO ABS: 1.1 10*3/uL — AB (ref 0.1–1.0)
Monocytes Relative: 10.9 % (ref 3.0–12.0)
NEUTROS PCT: 64.7 % (ref 43.0–77.0)
Neutro Abs: 6.7 10*3/uL (ref 1.4–7.7)
Platelets: 411 10*3/uL — ABNORMAL HIGH (ref 150.0–400.0)
RBC: 3.61 Mil/uL — ABNORMAL LOW (ref 3.87–5.11)
RDW: 14.4 % (ref 11.5–15.5)
WBC: 10.4 10*3/uL (ref 4.0–10.5)

## 2018-09-30 LAB — COMPREHENSIVE METABOLIC PANEL
ALT: 13 U/L (ref 0–35)
AST: 13 U/L (ref 0–37)
Albumin: 3.4 g/dL — ABNORMAL LOW (ref 3.5–5.2)
Alkaline Phosphatase: 84 U/L (ref 39–117)
BUN: 16 mg/dL (ref 6–23)
CHLORIDE: 94 meq/L — AB (ref 96–112)
CO2: 34 meq/L — AB (ref 19–32)
Calcium: 10 mg/dL (ref 8.4–10.5)
Creatinine, Ser: 0.5 mg/dL (ref 0.40–1.20)
GFR: 122.26 mL/min (ref >60.00)
GLUCOSE: 87 mg/dL (ref 70–99)
POTASSIUM: 4.7 meq/L (ref 3.5–5.1)
SODIUM: 133 meq/L — AB (ref 135–145)
Total Bilirubin: 0.3 mg/dL (ref 0.2–1.2)
Total Protein: 6.6 g/dL (ref 6.0–8.3)

## 2018-09-30 LAB — TSH: TSH: 3.56 u[IU]/mL (ref 0.35–4.50)

## 2018-09-30 MED ORDER — SENNOSIDES-DOCUSATE SODIUM 8.6-50 MG PO TABS: 1.0000 | ORAL_TABLET | Freq: Two times a day (BID) | ORAL | 1 refills | Status: DC | PRN

## 2018-09-30 NOTE — Progress Notes (Signed)
BRINKLEE CISSE DOB: 1923/04/15 Encounter date: 09/30/2018  This is a 84 y.o. female who presents to establish care. Chief Complaint  Patient presents with  . Transitions Of Care    no new concerns    History of present illness: Has been regularly going to pulmonary and dermatology.   Seeing derm for skin cancer. Recent left ankle biopsy with some swelling. Better in morning, but difficult to keep elevated. Not wearing compression stockings.   COPD; O2 dependent: Has been stable.  Paoxysmal A fib: no symptoms; no heart racing. This was a more recent diagnosis.  Arthritis: doesn't bother her much.  Esoph stricture: has swallowing problem. Hard time taking larger pills. Needs to crush these. Had therapy in hospital this year for swallowing. Takes time with eating/drinking and uses precautions learned to help with effective swallowing.   Past Medical History:  Diagnosis Date  . Anemia   . B12 DEFICIENCY 03/31/2008  . COPD 11/27/2008  . ESOPHAGEAL STRICTURE 01/29/2009  . MASS, LUNG 10/13/2009  . NASAL FRACTURE 08/12/2009  . OSTEOARTHRITIS 03/31/2008   Hips  . OSTEOPOROSIS 12/27/2007  . PEDAL EDEMA 12/27/2007  . Pneumonia 07/10/2016  . PULMONARY NODULE 10/21/2009  . RESPIRATORY FAILURE, CHRONIC 06/29/2010  . Shortness of breath dyspnea    Past Surgical History:  Procedure Laterality Date  . APPENDECTOMY     '38  . BALLOON DILATION N/A 05/10/2015   Procedure: BALLOON DILATION;  Surgeon: Louis Meckel, MD;  Location: [facility];  Service: Endoscopy;  Laterality: N/A;  . BALLOON DILATION N/A 08/02/2015   Procedure: BALLOON DILATION;  Surgeon: Louis Meckel, MD;  Location: [facility];  Service: Endoscopy;  Laterality: N/A;  . BREAST SURGERY     biopsy '47  . CATARACT EXTRACTION Bilateral    last '05  . CHOLECYSTECTOMY     laparoscopic'10  . ESOPHAGOGASTRODUODENOSCOPY     with dilatation x3  . ESOPHAGOGASTRODUODENOSCOPY (EGD) WITH PROPOFOL N/A 05/10/2015   Procedure:  ESOPHAGOGASTRODUODENOSCOPY (EGD) WITH PROPOFOL;  Surgeon: Louis Meckel, MD;  Location: [facility];  Service: Endoscopy;  Laterality: N/A;  balloon dilation  . ESOPHAGOGASTRODUODENOSCOPY (EGD) WITH PROPOFOL N/A 08/02/2015   Procedure: ESOPHAGOGASTRODUODENOSCOPY (EGD) WITH PROPOFOL;  Surgeon: Louis Meckel, MD;  Location: [facility];  Service: Endoscopy;  Laterality: N/A;  . LUMBAR LAMINECTOMY    . NASAL RECONSTRUCTION WITH SEPTAL REPAIR     '10  . RECTAL PROLAPSE REPAIR     '06  . skin graf     '98   Allergies  Allergen Reactions  . Penicillins Rash    Has patient had a PCN reaction causing immediate rash, facial/tongue/throat swelling, SOB or lightheadedness with hypotension: Yes Has patient had a PCN reaction causing severe rash involving mucus membranes or skin necrosis: Unknown Has patient had a PCN reaction that required hospitalization: Unknown Has patient had a PCN reaction occurring within the last 10 years: No If all of the above answers are "NO", then may proceed with Cephalosporin use.    Current Meds  Medication Sig  . Acetaminophen (TYLENOL) 325 MG CAPS Take 1 capsule by mouth every 6 (six) hours as needed.  Marland Kitchen albuterol (PROVENTIL) (2.5 MG/3ML) 0.083% nebulizer solution Take 3 mLs (2.5 mg total) by nebulization every 6 (six) hours as needed for wheezing or shortness of breath.  Marland Kitchen arformoterol (BROVANA) 15 MCG/2ML NEBU Take 2 mLs (15 mcg total) by nebulization 2 (two) times daily.  . Calcium-Vitamin D-Vitamin K (VIACTIV) 500-500-40 MG-UNT-MCG CHEW Chew by mouth daily.  Marland Kitchen  cefdinir (OMNICEF) 300 MG capsule Take 1 capsule (300 mg total) by mouth 2 (two) times daily.  . Cholecalciferol 50000 units TABS Take 5,000 Units by mouth.  . cyanocobalamin (,VITAMIN B-12,) 1000 MCG/ML injection INJECT 1 ML INTO THE MUSCLE ONCE MONTHLY  . dextromethorphan-guaiFENesin (MUCINEX DM) 30-600 MG 12hr tablet Take 1 tablet by mouth 2 (two) times daily as needed (cough/congestion).  Marland Kitchen  escitalopram (LEXAPRO) 5 MG tablet Take 1 tablet (5 mg total) by mouth daily.  . famotidine (PEPCID) 20 MG tablet One at bedtime  . mirtazapine (REMERON) 15 MG tablet Take 1 tablet (15 mg total) by mouth at bedtime.  . Multiple Vitamins-Minerals (ICAPS AREDS 2) CAPS Take 1 capsule by mouth daily.   . Naproxen Sodium (ALEVE) 220 MG CAPS Take by mouth 2 (two) times daily as needed.  . OXYGEN Oxygen 2lpm 24/7  . Polyethyl Glycol-Propyl Glycol (SYSTANE OP) Place 1 drop into both eyes daily.   . Revefenacin (YUPELRI) 175 MCG/3ML SOLN Take 1 vial by nebulization daily.  Marland Kitchen senna-docusate (SENOKOT-S) 8.6-50 MG tablet Take 1 tablet by mouth 2 (two) times daily as needed for mild constipation.  . [DISCONTINUED] senna-docusate (SENOKOT-S) 8.6-50 MG tablet Take 1 tablet by mouth at bedtime as needed for mild constipation.   Social History   Tobacco Use  . Smoking status: Former Smoker    Packs/day: 0.50    Years: 35.00    Pack years: 17.50    Last attempt to quit: 12/18/1978    Years since quitting: 39.8  . Smokeless tobacco: Never Used  Substance Use Topics  . Alcohol use: Yes    Alcohol/week: 0.0 standard drinks    Comment: couple of gin and tonics daily   Family History  Problem Relation Age of Onset  . Hypertension Son   . Asthma Mother   . Abdominal Wall Hernia Mother   . Stomach cancer Mother      Review of Systems  Constitutional: Positive for fatigue. Negative for activity change (limited activity level), appetite change (has been poor for a long time), chills and fever.  HENT: Negative for congestion.   Respiratory: Positive for cough and shortness of breath (on O2 long term; occasionally with coughing spell will increase to 4L and feels much better. They limit this.).   Cardiovascular: Positive for leg swelling (see HPI). Negative for chest pain and palpitations.  Gastrointestinal: Positive for abdominal pain (occasionally; related to constipation. uses Senna prn) and  constipation. Negative for diarrhea and vomiting.  Genitourinary: Negative for difficulty urinating and dysuria.  Musculoskeletal: Negative for arthralgias. Back pain: back bothers her with leg elevation.  Skin:       Has had multiple skin lesions; frequently following up with dermatology  Psychiatric/Behavioral: Negative for sleep disturbance.    Objective:  BP 120/80 (BP Location: Left Arm, Patient Position: Sitting, Cuff Size: Normal)   Temp 97.6 F (36.4 C) (Oral)   Wt 101 lb 6.4 oz (46 kg)   BMI 16.87 kg/m   Weight: 101 lb 6.4 oz (46 kg)   BP Readings from Last 3 Encounters:  09/30/18 120/80  09/18/18 126/86  06/26/18 (!) 160/98   Wt Readings from Last 3 Encounters:  09/30/18 101 lb 6.4 oz (46 kg)  09/18/18 98 lb 3.2 oz (44.5 kg)  06/26/18 97 lb (44 kg)    Physical Exam  Constitutional: She is oriented to person, place, and time. She appears well-developed. No distress.  She is thin  Cardiovascular: Normal rate, regular rhythm and normal  heart sounds. Exam reveals no friction rub.  No murmur heard. 2+ LLE edema, 1+ RLE edema foot  Pulmonary/Chest: Effort normal. No respiratory distress. She has decreased breath sounds (diffusely diminished). She has no wheezes. She has rales (r basilar >left).  Neurological: She is alert and oriented to person, place, and time.  Psychiatric: She has a normal mood and affect. Her speech is normal and behavior is normal. Cognition and memory are normal.    Assessment/Plan: 1. Fatigue, unspecified type Will get some repeat baseline bloodwork since here today. Suspect fatigue is related to deconditioning and chronic lung disease. - CBC with Differential/Platelet; Future - Comprehensive metabolic panel; Future - TSH; Future - TSH - Comprehensive metabolic panel - CBC with Differential/Platelet  2. Chronic respiratory failure with hypoxia, on home oxygen therapy (HCC) Following with pulm; followup on weds. Encouraged family to discuss  cost concerns with medication with pulmonology.  3. Constipation, unspecified constipation type Ok to add miralax. Make sure keeping up with fluids.  - senna-docusate (SENOKOT-S) 8.6-50 MG tablet; Take 1 tablet by mouth 2 (two) times daily as needed for mild constipation.  Dispense: 90 tablet; Refill: 1  LE edema: elevate legs above heart level and consider wearing compression stockings during day to help keep fluid down. Let us know if not improving or worsening in next couple of weeks.   Return pending bloodwork.  Theodis Shove, MD

## 2018-10-01 ENCOUNTER — Telehealth: Payer: Self-pay | Admitting: Internal Medicine

## 2018-10-01 NOTE — Telephone Encounter (Signed)
Called patient, advised that we have placed two samples up front for pick up. Nothing further needed.

## 2018-10-04 ENCOUNTER — Encounter: Payer: Self-pay | Admitting: Adult Health

## 2018-10-04 ENCOUNTER — Ambulatory Visit (INDEPENDENT_AMBULATORY_CARE_PROVIDER_SITE_OTHER): Payer: Federal, State, Local not specified - PPO | Admitting: Adult Health

## 2018-10-04 DIAGNOSIS — J479 Bronchiectasis, uncomplicated: Secondary | ICD-10-CM | POA: Diagnosis not present

## 2018-10-04 DIAGNOSIS — J9611 Chronic respiratory failure with hypoxia: Secondary | ICD-10-CM

## 2018-10-04 DIAGNOSIS — J449 Chronic obstructive pulmonary disease, unspecified: Secondary | ICD-10-CM

## 2018-10-04 NOTE — Patient Instructions (Addendum)
[name] and [place] , once done resume.  Spiriva 2 puffs daily  And Dulera 2 puffs Twice daily  (Rinse after use)  Follow med calendar closely and bring to each visit.  Continue on Oxygen 2l/m .  Follow up with Dr. Sherene Sires or Reynold Mantell NP  In 4 weeks and As needed   Please contact office for sooner follow up if symptoms do not improve or worsen or seek emergency care

## 2018-10-04 NOTE — Progress Notes (Signed)
@Patient  ID: Sharon [name], female    DOB: 03/09/1923, 84 y.o.   MRN: [id number]    Referring provider: Gordy Savers, MD  HPI: 84 yo female former smoker followed for GOLD III COPD/emphysema, Bronchiectasis , Mild chronic nodular infiltrates dating back to 2009. O2 RF  CAP 07/2016 w/ parapneumonic effusion s/p tap   10/04/2018 Follow up : COPD , Bronchiectasis , O2 RF  Patient presents for a 2-week follow-up.  Last visit was having a slow to resolve bronchiectatic exacerbation.  She was instructed to finish antibiotics.  Chest x-ray last visit showed chronic emphysema without acute abnormality.  Nipple shadow on the right was suspected.  She was changed to [place] and [employer] .  She says these are not affordable .  Has [employer]. Does not have MCR B or D .  Rosalyn Gess is $1600/mon   And Yupelri $1300 /mon Her cost is $1400/mon .  We reviewed all her medications and organize them into a medication count with patient education.  Appears to be taking her medications correctly.  We went over several items and options to help with her medications.  Decided that returning back to her inhalers would be the most affordable and effective regimen..  Since last visit she is feeling some better.  She continues to have productive cough. She remains on oxygen 2 L.  She is accompanied by her family.   Allergies  Allergen Reactions  . Penicillins Rash    Has patient had a PCN reaction causing immediate rash, facial/tongue/throat swelling, SOB or lightheadedness with hypotension: Yes Has patient had a PCN reaction causing severe rash involving mucus membranes or skin necrosis: Unknown Has patient had a PCN reaction that required hospitalization: Unknown Has patient had a PCN reaction occurring within the last 10 years: No If all of the above answers are "NO", then may proceed with Cephalosporin use.     Immunization History  Administered Date(s) Administered  . Influenza  Split 09/26/2011, 09/27/2012  . Influenza Whole 09/30/2009  . Influenza, High Dose Seasonal PF 09/24/2013, 01/10/2016, 09/21/2016, 09/25/2017, 09/18/2018  . Influenza-Unspecified 10/13/2014  . Pneumococcal Conjugate-13 02/11/2014  . Pneumococcal Polysaccharide-23 05/05/2009  . Td 05/05/2009    Past Medical History:  Diagnosis Date  . Anemia   . B12 DEFICIENCY 03/31/2008  . COPD 11/27/2008  . ESOPHAGEAL STRICTURE 01/29/2009  . MASS, LUNG 10/13/2009  . NASAL FRACTURE 08/12/2009  . OSTEOARTHRITIS 03/31/2008   Hips  . OSTEOPOROSIS 12/27/2007  . PEDAL EDEMA 12/27/2007  . Pneumonia 07/10/2016  . PULMONARY NODULE 10/21/2009  . RESPIRATORY FAILURE, CHRONIC 06/29/2010  . Shortness of breath dyspnea     Tobacco History: Social History   Tobacco Use  Smoking Status Former Smoker  . Packs/day: 0.50  . Years: 35.00  . Pack years: 17.50  . Last attempt to quit: 12/18/1978  . Years since quitting: 39.8  Smokeless Tobacco Never Used   Counseling given: Not Answered   Outpatient Medications Prior to Visit  Medication Sig Dispense Refill  . Acetaminophen (TYLENOL) 325 MG CAPS Take 1 capsule by mouth every 6 (six) hours as needed.    Marland Kitchen albuterol (PROVENTIL) (2.5 MG/3ML) 0.083% nebulizer solution Take 3 mLs (2.5 mg total) by nebulization every 6 (six) hours as needed for wheezing or shortness of breath. 150 mL 2  . arformoterol (BROVANA) 15 MCG/2ML NEBU Take 2 mLs (15 mcg total) by nebulization 2 (two) times daily. 120 mL 0  . Calcium-Vitamin D-Vitamin K (VIACTIV) 500-500-40 MG-UNT-MCG  CHEW Chew by mouth daily.    . cefdinir (OMNICEF) 300 MG capsule Take 1 capsule (300 mg total) by mouth 2 (two) times daily. 20 capsule 0  . Cholecalciferol 50000 units TABS Take 5,000 Units by mouth.    . cyanocobalamin (,VITAMIN B-12,) 1000 MCG/ML injection INJECT 1 ML INTO THE MUSCLE ONCE MONTHLY 10 mL 0  . dextromethorphan-guaiFENesin (MUCINEX DM) 30-600 MG 12hr tablet Take 1 tablet by mouth 2 (two) times daily  as needed (cough/congestion).    Marland Kitchen doxycycline (VIBRAMYCIN) 100 MG capsule Take 100 mg by mouth 2 (two) times daily.    Marland Kitchen escitalopram (LEXAPRO) 5 MG tablet Take 1 tablet (5 mg total) by mouth daily. 90 tablet 3  . famotidine (PEPCID) 20 MG tablet One at bedtime 30 tablet 2  . mirtazapine (REMERON) 15 MG tablet Take 1 tablet (15 mg total) by mouth at bedtime. 90 tablet 3  . Multiple Vitamins-Minerals (ICAPS AREDS 2) CAPS Take 1 capsule by mouth daily.     . Naproxen Sodium (ALEVE) 220 MG CAPS Take by mouth 2 (two) times daily as needed.    . OXYGEN Oxygen 2lpm 24/7    . Polyethyl Glycol-Propyl Glycol (SYSTANE OP) Place 1 drop into both eyes daily.     . Revefenacin (YUPELRI) 175 MCG/3ML SOLN Take 1 vial by nebulization daily. 90 mL 0  . senna-docusate (SENOKOT-S) 8.6-50 MG tablet Take 1 tablet by mouth 2 (two) times daily as needed for mild constipation. 90 tablet 1   No facility-administered medications prior to visit.      Review of Systems  Constitutional:   No  weight loss, night sweats,  Fevers, chills,  +fatigue, or  lassitude.  HEENT:   No headaches,  Difficulty swallowing,  Tooth/dental problems, or  Sore throat,                No sneezing, itching, ear ache, nasal congestion, post nasal drip,   CV:  No chest pain,  Orthopnea, PND, swelling in lower extremities, anasarca, dizziness, palpitations, syncope.   GI  No heartburn, indigestion, abdominal pain, nausea, vomiting, diarrhea, change in bowel habits, loss of appetite, bloody stools.   Resp:  No chest wall deformity  Skin: no rash or lesions.  GU: no dysuria, change in color of urine, no urgency or frequency.  No flank pain, no hematuria   MS:  No joint pain or swelling.  No decreased range of motion.  No back pain.    Physical Exam  BP 130/62 (BP Location: Left Arm, Patient Position: Sitting, Cuff Size: Normal)   Pulse 71   Ht 5\' 5"  (1.651 m)   Wt 104 lb 6.4 oz (47.4 kg)   SpO2 95%   BMI 17.37 kg/m   GEN:  A/Ox3; pleasant , NAD, thin and frail , elderly    HEENT:  [place]/AT,  EACs-clear, TMs-wnl, NOSE-clear, THROAT-clear, no lesions, no postnasal drip or exudate noted.   NECK:  Supple w/ fair ROM; no JVD; normal carotid impulses w/o bruits; no thyromegaly or nodules palpated; no lymphadenopathy.    RESP  Few trace rhonchi , . no accessory muscle use, no dullness to percussion  CARD:  RRR, no m/r/g, no peripheral edema, pulses intact, no cyanosis or clubbing.  GI:   Soft & nt; nml bowel sounds; no organomegaly or masses detected.   Musco: Warm bil, no deformities or joint swelling noted.   Neuro: alert, no focal deficits noted.    Skin: Warm, no lesions or rashes  Lab Results:   BMET  BNP No results found for: BNP  Imaging: Dg Chest 2 View  Result Date: 09/18/2018 CLINICAL DATA:  Pneumonia. EXAM: CHEST - 2 VIEW COMPARISON:  CT scan July 19, 2017 and chest x-ray December 24, 2017 FINDINGS: No pneumothorax. Emphysematous changes in the lungs. A nodular density projected over the right base may represent a nipple shadow. Mild atelectasis in the left base. No other acute abnormalities. IMPRESSION: Nodular density over the right base is favored to represent a nipple shadow. Recommend repeat imaging with nipple markers. No other acute abnormalities identified. Emphysematous changes remain in the lungs. Electronically Signed   By: Gerome Sam III M.D   On: 09/18/2018 18:41     Assessment & Plan:   No problem-specific Assessment & Plan notes found for this encounter.     Rubye Oaks, NP 10/04/2018

## 2018-10-07 NOTE — Assessment & Plan Note (Signed)
Recent flare now resolving  Patient's medications were reviewed today and patient education was given. Computerized medication calendar was adjusted/completed   Plan  Patient Instructions  [name] and [place] , once done resume.  Spiriva 2 puffs daily  And Dulera 2 puffs Twice daily  (Rinse after use)  Follow med calendar closely and bring to each visit.  Continue on Oxygen 2l/m .  Follow up with Dr. Sherene Sires or [place] NP  In 4 weeks and As needed   Please contact office for sooner follow up if symptoms do not improve or worsen or seek emergency care

## 2018-10-07 NOTE — Assessment & Plan Note (Signed)
Cont on O2 .  

## 2018-10-07 NOTE — Assessment & Plan Note (Signed)
Recent flare now resolving  Pt teaching on inhaler  Will not be able to afford Mikael Spray and Rosalyn Gess nebs  Plan  Patient Instructions  Sharon [name] and [place] , once done resume.  Spiriva 2 puffs daily  And Dulera 2 puffs Twice daily  (Rinse after use)  Follow med calendar closely and bring to each visit.  Continue on Oxygen 2l/m .  Follow up with Dr. Sherene Sires or Hays Dunnigan NP  In 4 weeks and As needed   Please contact office for sooner follow up if symptoms do not improve or worsen or seek emergency care

## 2018-10-08 MED ORDER — TIOTROPIUM BROMIDE MONOHYDRATE 2.5 MCG/ACT IN AERS: 2.0000 | INHALATION_SPRAY | Freq: Every day | RESPIRATORY_TRACT | 0 refills | Status: DC

## 2018-10-08 NOTE — Addendum Note (Signed)
Addended by: Boone Master E on: 10/08/2018 10:41 AM   Modules accepted: Orders

## 2018-10-11 ENCOUNTER — Telehealth: Payer: Self-pay | Admitting: Internal Medicine

## 2018-10-11 NOTE — Telephone Encounter (Signed)
Called and spoke to patient son, son states they were given temporary samples of Yupelri and Brovana to use and once done to go back to [place]. They wanted to let MW know that the brovana was complete and they had 1 day of yupelri left and they would switch back to the [facility] and Spiriva. Will send to MW as Lorain Childes.   MW see above message. Patient unable to afford yupelri and brovana. Just FYI.

## 2018-10-14 ENCOUNTER — Telehealth: Payer: Self-pay | Admitting: Internal Medicine

## 2018-10-14 NOTE — Telephone Encounter (Signed)
Called and spoke to Sharon [name], she needs a verbal order to extend Physical Therapy for 1 time a week for 2 weeks.   MW please advise if okay to give verbal order.

## 2018-10-14 NOTE — Telephone Encounter (Signed)
Ok

## 2018-10-14 NOTE — Telephone Encounter (Signed)
Called and spoke to [place], verbal order given for PT. Nothing further is needed at this time.

## 2018-10-18 ENCOUNTER — Telehealth: Payer: Self-pay | Admitting: Family Medicine

## 2018-10-18 NOTE — Telephone Encounter (Signed)
Copied from CRM [id number]. Topic: Quick Communication - Home Health Verbal Orders >> Oct 18, 2018  5:23 PM Valora Piccolo wrote: Caller/Agency: [facility] Callback Number: [phone] Requesting OT/PT/Skilled Nursing/Social Work: OT & Home Health 1x  Frequency: 1x Evaluation Patient does not feel that she need OT & Home Health.

## 2018-10-21 NOTE — Telephone Encounter (Signed)
Could you clarify: is she still getting PT (or wanting PT) or any other in home assistance? Just want to make sure she is getting what she needs/what family needs for her to be in best health she can at home. Just clarify with patient please.

## 2018-10-21 NOTE — Telephone Encounter (Signed)
Left message for patient to call back. CRM created 

## 2018-10-22 NOTE — Telephone Encounter (Signed)
Patient stated she is currently only doing PT.

## 2018-10-23 NOTE — Telephone Encounter (Signed)
OK. That is fine. She has good family help/support. We could always restart other services if needed.

## 2018-10-24 NOTE — Telephone Encounter (Signed)
Left a message with Georgiann Hahn giving the ok for PT.

## 2018-11-04 ENCOUNTER — Ambulatory Visit: Payer: Federal, State, Local not specified - PPO | Admitting: Internal Medicine

## 2018-11-04 ENCOUNTER — Encounter: Payer: Self-pay | Admitting: Internal Medicine

## 2018-11-04 VITALS — BP 130/64 | HR 71 | Ht 65.0 in | Wt 105.0 lb

## 2018-11-04 DIAGNOSIS — J9611 Chronic respiratory failure with hypoxia: Secondary | ICD-10-CM

## 2018-11-04 DIAGNOSIS — J449 Chronic obstructive pulmonary disease, unspecified: Secondary | ICD-10-CM

## 2018-11-04 NOTE — Progress Notes (Addendum)
Subjective:   Patient ID: Sharon [name], female    DOB: 10/21/1923     MRN: [id number]   Brief patient profile:  89    yowf quit smoking in 1980 with no minimal chronic doe assoc mild chronic nodular infiltrates dating back to 11/23/2008 with documented GOLD III criteria copd/ bronchiectasis by CT 09/2009 both lower lobes      Admit date: 07/09/2016 Discharge date: 07/12/2016  Discharge Diagnoses:  Principal Problem:   CAP (community acquired pneumonia)   Depression   Community acquired pneumonia   COPD (chronic obstructive pulmonary disease) with emphysema (HCC)   Sepsis (HCC)   Acute respiratory failure with hypoxia (HCC)   Hyponatremia      08/16/2016 1st [facility] office visit/Epic era/ Jacobe Study  Re s/p CAP in pt with bronchiectasis/ GOLD III COPD  Chief Complaint  Patient presents with  . Pulmonary Consult    Referred by Dr. Eleonore ChiquitoPeter Kwiatkowski for pleural effusion. Pt states that she has been dxed with PNA x 2 since July 2017. She has had cough and SOB. Cough is currently non prod.   baseline = MMRC2 = can't walk a nl pace on a flat grade s sob but does fine slow and flat eg [employer] shopping/ uses [facility] parking on symbic[address] 160 2bid and rare need for combivent  Acutely ill with hoarseness in [place] p getting off plan from [place] then dry cough and admit: Now on 02 at 3lpm 24/7 rec Please see patient coordinator before you leave today  to schedule venous doppler asap on Left> neg  Left  thoracentesis 08/17/16 =  1 liter on Left  WBC 1,020  L > P  Transudate with nl glucose/ cyt neg       12/24/2017  Extended acute  ov/Vallorie Niccoli re: re-establish   COPD GOLD III with bronchiectasis  And prior tranusdative efffuson  Chief Complaint  Patient presents with  . Acute Visit    SOB with exertion, non productive cough more in the morning and with talking,nasal congestion,left foot swelling  solid food / pill dysphagia Sleeps left side down horizontally  Worse doe x 07/2017 rx   dulera 200 2bid helped some but still needing combivent maybe once or whice a day but not noct  Doe now = MMRC3 = can't walk 100 yards even at a slow pace at a flat grade s stopping due to sob  ? On approp 02?  rec Work on inhaler technique:  relax and gently blow all the way out then take a nice smooth deep breath back in, triggering the inhaler at same time you start breathing in.  Hold for up to 5 seconds if you can. Blow out thru nose. Rinse and gargle with water when done. Prednisone 10 mg take  4 each am x 2 days,   2 each am x 2 days,  1 each am x 2 days and stop  Pantoprazole (protonix) 40 mg   Take  30-60 min before first meal of the day and Pepcid (famotidine)  20 mg one @  bedtime until return to office - this is the best way to tell whether stomach acid is contributing to your problem.   GERD   We will have [facility] do a best fit evaluation for your 02 needs        09/18/2018  f/u ov/Jestin Burbach re:  GOLD III/ bronchiectasis, no longer able to use devices  Chief Complaint  Patient presents with  . Follow-up  Pt states she tires easily. She has not been walking much at all for the past month. She has had prod cough with green sputum for several months.   Dyspnea:  Across the room on up to 4lpm Cough: worse x several months, on doxy for skin infection Sleeping: bed flat, on back 2 pillows SABA use: neb every few days 02: 2lpm at rest   rec Plan A = Automatic = Brovana  And Yupelri each am  And in evening @ 12 hours later  just the Brovana   Plan B = Backup only use your albuterol nebulizer if you first try Plan B and it fails to help > ok to use the nebulizer up to every 4 hours but if start needing it regularly call for immediate appointment Finish your antibotics  For cough > max dose of mucinex or mucinex is 1200 mg every 12 hours Please see patient coordinator before you leave today  to schedule home pulmonary rehab per [facility]         10/04/18  NP  [name] and  Kincaid , once done resume.  Spiriva 2 puffs daily  And Dulera 2 puffs Twice daily  (Rinse after use)  Follow med calendar closely and bring to each visit.  Continue on Oxygen 2l/m      11/04/2018  f/u ov/Jatavious Peppard re: GOLD III criteria / maint on dulera/ spiriva doing as well as on neb equivalents  Chief Complaint  Patient presents with  . Follow-up    Breathing is unchanged. She has not been using her neb often.   Dyspnea:  Room to  Room on walking  and dropping on 2lpm  Cough: just in am  Sleeping: flat bed/ 2 pillows SABA use: not using much at all  02: 2lpm 24/7    No obvious day to day or daytime variability or assoc excess/ purulent sputum or mucus plugs or hemoptysis or cp or chest tightness, subjective wheeze or overt sinus or hb symptoms.   Sleeping as above  without nocturnal  or early am exacerbation  of respiratory  c/o's or need for noct saba. Also denies any obvious fluctuation of symptoms with weather or environmental changes or other aggravating or alleviating factors except as outlined above   No unusual exposure hx or h/o childhood pna/ asthma or knowledge of premature birth.  Current Allergies, Complete Past Medical History, Past Surgical History, Family History, and Social History were reviewed in [employer] record.  ROS  The following are not active complaints unless bolded Hoarseness, sore throat, dysphagia, dental problems, itching, sneezing,  nasal congestion or discharge of excess mucus or purulent secretions, ear ache,   fever, chills, sweats, unintended wt loss or wt gain, classically pleuritic or exertional cp,  orthopnea pnd or arm/hand swelling  or leg swelling, presyncope, palpitations, abdominal pain, anorexia, nausea, vomiting, diarrhea  or change in bowel habits or change in bladder habits, change in stools or change in urine, dysuria, hematuria,  rash, arthralgias, visual complaints, headache, numbness, weakness or ataxia or problems  with walking or coordination,  change in mood or  memory.        Current Meds  Medication Sig  . Acetaminophen (TYLENOL) 325 MG CAPS Take 1 capsule by mouth every 6 (six) hours as needed.  Marland Kitchen albuterol (PROVENTIL) (2.5 MG/3ML) 0.083% nebulizer solution Take 3 mLs (2.5 mg total) by nebulization every 6 (six) hours as needed for wheezing or shortness of breath.  . Calcium-Vitamin D-Vitamin K (  VIACTIV) 500-500-40 MG-UNT-MCG CHEW Chew by mouth daily.  . Cholecalciferol 50000 units TABS Take 5,000 Units by mouth.  . cyanocobalamin (,VITAMIN B-12,) 1000 MCG/ML injection INJECT 1 ML INTO THE MUSCLE ONCE MONTHLY  . dextromethorphan-guaiFENesin (MUCINEX DM) 30-600 MG 12hr tablet Take 1 tablet by mouth 2 (two) times daily as needed (cough/congestion).  Marland Kitchen. doxycycline (VIBRAMYCIN) 100 MG capsule Take 100 mg by mouth 2 (two) times daily.  Marland Kitchen. escitalopram (LEXAPRO) 5 MG tablet Take 1 tablet (5 mg total) by mouth daily.  . famotidine (PEPCID) 20 MG tablet One at bedtime  . mirtazapine (REMERON) 15 MG tablet Take 1 tablet (15 mg total) by mouth at bedtime.  . Multiple Vitamins-Minerals (ICAPS AREDS 2) CAPS Take 1 capsule by mouth daily.   . OXYGEN Oxygen 2lpm 24/7  . Polyethyl Glycol-Propyl Glycol (SYSTANE OP) Place 1 drop into both eyes daily.   Marland Kitchen. senna-docusate (SENOKOT-S) 8.6-50 MG tablet Take 1 tablet by mouth 2 (two) times daily as needed for mild constipation.  . Tiotropium Bromide Monohydrate (SPIRIVA RESPIMAT) 2.5 MCG/ACT AERS Inhale 2 puffs into the lungs daily.             Past Medical History: Osteoporosis B12 deficiency Osteoarthritis COPD    - PFT's December 03, 2009 FEV1 67 (40%) with 12% response to B2 and DLC0 55%    - HFA 25% June 29, 2010 > 75% August 11, 2010 > 75% November 03, 2010  Fx Right Hip and required ORIF June 19th 2011  history of esophageal stricture...........................................Marland Kitchen.[place] Multiple pulmonary  nodules..................................................Marland Kitchen.Marvel Sapp    -CT chest 10/13/09 assoc with bilateral lower lobe bronchiectasis    -CXR December 03, 2009 no change  > no change June 29, 2010            Objective:   Physical Exam   wc bound elderly wf nad   11/04/2018      105  09/18/2018        98  04/22/2018           96  12/24/2017           96   08/16/16 102 lb (46.3 kg)  08/15/16 101 lb 4 oz (45.9 kg)  07/28/16 99 lb 12.8 oz (45.3 kg)    Vital signs reviewed - Note on arrival 02 sats  93% on 2lpm           Mild bilateral non-specific turbinate edema       HEENT: nl dentition / oropharynx. Nl external ear canals without cough reflex -  Mild bilateral non-specific turbinate edema     NECK :  without JVD/Nodes/TM/ nl carotid upstrokes bilaterally   LUNGS: no acc muscle use,  Mild barrel  contour chest wall with bilateral  Distant bs s audible wheeze and  without cough on insp or exp maneuver and mild  Hyperresonant  to  percussion bilaterally     CV:  RRR  no s3 or murmur or increase in P2, and  Trace pitting edema  on R, 1 + on L   ABD:  soft and nontender with pos mid  insp Hoover's  in the supine position. No bruits or organomegaly appreciated, bowel sounds nl  MS:   Nl gait/  ext warm with muscle atrophy/ diffuse , calf tenderness, cyanosis or clubbing No obvious joint restrictions   SKIN: warm and dry without lesions    NEURO:  alert, approp, nl sensorium with  no motor or cerebellar deficits apparent.  Assessment & Plan:

## 2018-11-04 NOTE — Patient Instructions (Addendum)
Goal with walking is to keep the 02 sats over 90% and if POC not adequate then need continuous flow.   Call if not able to keep over 90% with your level of activity then call me to arrange best fit for ambulatory 02   See calendar for specific medication instructions and bring it back for each and every office visit for every healthcare provider you see.  Without it,  you may not receive the best quality medical care that we feel you deserve.  You will note that the calendar groups together  your maintenance  medications that are timed at particular times of the day.  Think of this as your checklist for what your doctor has instructed you to do until your next evaluation to see what benefit  there is  to staying on a consistent group of medications intended to keep you well.  The other group at the bottom is entirely up to you to use as you see fit  for specific symptoms that may arise between visits that require you to treat them on an as needed basis.  Think of this as your action plan or "what if" list.   Separating the top medications from the bottom group is fundamental to providing you adequate care going forward.    Please schedule a follow up visit in 3 months but call sooner if needed with cxr on return

## 2018-11-04 NOTE — Assessment & Plan Note (Signed)
On 02 2lpm since d/c from CAP 07/12/16 - ono RA  10/17/16  desat < 88% x 114 min so rec 2lpm hs to continue  -12/24/2017 referred for Best fit for 02 > done 01/02/18 did not qualify for POC  > desat to 84 on setting of 6 pulsed     As of 11/04/2018  Up to 4 lpm with walking o/w 2lpm 24/7     I had an extended  discussion with the patient/fm  reviewing all relevant studies completed to date and  lasting 15 to 20 minutes of a 25 minute visit on the following issues:    Emphasized the need to keep sats > 90% while walking by using home pulse ox and calling if needs another source of 02 for ambulation other than POIC which cannot likely deliver enough for this purpose but is much more practical/convenient for freq trips she and fm have in mind (eg [place], but the flight would be at rest and I foresee no problem with that).   .Marland Kitchen

## 2018-11-04 NOTE — Assessment & Plan Note (Signed)
Quit smoking 1980 -  PFT's December 03, 2009 FEV1 67 (40%) with 12% response to B2 and DLC0 55% - 12/24/2017  After extensive coaching inhaler device  effectiveness =    50% from a baseline of 25% - 09/18/2018  After extensive coaching inhaler device,  effectiveness =    25% from a baseline of 0  With spacer  Seems to be doing as well off lama/laba/ics on hfa/ smi and off nebs at this point probably because she is so sedentary   No change in rx needed at this point     Each maintenance medication was reviewed in detail including most importantly the difference between maintenance and as needed and under what circumstances the prns are to be used. This was done in the context of a medication calendar review which provided the patient with a user-friendly unambiguous mechanism for medication administration and reconciliation and provides an action plan for all active problems. It is critical that this be shown to every doctor  for modification during the office visit if necessary so the patient can use it as a working document.

## 2018-12-20 ENCOUNTER — Telehealth: Payer: Self-pay | Admitting: Internal Medicine

## 2018-12-20 NOTE — Telephone Encounter (Signed)
Attempted to call Juliette Alcide with  [facility] but unable to reach her. Left message for Juliette Alcide to return call.

## 2018-12-23 ENCOUNTER — Ambulatory Visit (INDEPENDENT_AMBULATORY_CARE_PROVIDER_SITE_OTHER)
Admission: RE | Admit: 2018-12-23 | Discharge: 2018-12-23 | Disposition: A | Discharge status: Home / Self Care | Payer: Federal, State, Local not specified - PPO | Source: Ambulatory Visit | Attending: Nurse Practitioner | Admitting: Nurse Practitioner

## 2018-12-23 ENCOUNTER — Encounter: Payer: Self-pay | Admitting: Nurse Practitioner

## 2018-12-23 ENCOUNTER — Ambulatory Visit: Payer: Federal, State, Local not specified - PPO | Admitting: Nurse Practitioner

## 2018-12-23 ENCOUNTER — Telehealth: Payer: Self-pay | Admitting: Internal Medicine

## 2018-12-23 VITALS — BP 124/68 | HR 72 | Temp 98.4°F | Ht 65.0 in | Wt 107.0 lb

## 2018-12-23 DIAGNOSIS — J9611 Chronic respiratory failure with hypoxia: Secondary | ICD-10-CM | POA: Diagnosis not present

## 2018-12-23 DIAGNOSIS — J471 Bronchiectasis with (acute) exacerbation: Secondary | ICD-10-CM | POA: Diagnosis not present

## 2018-12-23 DIAGNOSIS — J449 Chronic obstructive pulmonary disease, unspecified: Secondary | ICD-10-CM

## 2018-12-23 MED ORDER — DOXYCYCLINE HYCLATE 100 MG PO TABS: 100.0000 mg | ORAL_TABLET | Freq: Two times a day (BID) | ORAL | 0 refills | Status: DC

## 2018-12-23 NOTE — Progress Notes (Signed)
@Patient  ID: Sharon [name], female    DOB: [birth year], 85 y.o.   MRN: [id number]  Chief Complaint  Patient presents with  . Cough    Referring provider: Wynn Banker, MD  HPI  85 year old female former smoker followed for GOLD III COPD/emphysema, bronchiectasis, mild chronic nodular infiltrates dating back to 2009 who is followed by Dr. Sherene Sires.  Tests/events: Quit smoking 1980 -  PFT's December 03, 2009 FEV1 67 (40%) with 12% response to B2 and DLC0 55% - 12/24/2017  After extensive coaching inhaler device  effectiveness =    50% from a baseline of 25% - 09/18/2018  After extensive coaching inhaler device,  effectiveness =    25% from a baseline of 0  With spacer On 02 2lpm since d/c from CAP 07/12/16 - ono RA  10/17/16  desat < 88% x 114 min so rec 2lpm hs to continue  -12/24/2017 referred for Best fit for 02 > done 01/02/18 did not qualify for POC  > desat to 84 on setting of 6 pulsed  As of 11/04/2018  Up to 4 lpm with walking o/w 2lpm 24/7   OV 12/23/18 - acute cough Patient presents today with cough.  States the cough is been chronic for the last couple weeks cough is increased and become more productive. She states that cough is productive of yellow sputum. She has been using mucinex with minimal relief noted. States that her cough is progressively worsening and O2 sats have been down to 88% with exertion. She is on continuous O2 at 2L [place]. She is compliant with Spiriva and albuterol. She denies any fever, chest pain, or edema.     Allergies  Allergen Reactions  . Penicillins Rash    Has patient had a PCN reaction causing immediate rash, facial/tongue/throat swelling, SOB or lightheadedness with hypotension: Yes Has patient had a PCN reaction causing severe rash involving mucus membranes or skin necrosis: Unknown Has patient had a PCN reaction that required hospitalization: Unknown Has patient had a PCN reaction occurring within the last 10 years: No If all of the above  answers are "NO", then may proceed with Cephalosporin use.     Immunization History  Administered Date(s) Administered  . Influenza Split 09/26/2011, 09/27/2012  . Influenza Whole 09/30/2009  . Influenza, High Dose Seasonal PF 09/24/2013, 01/10/2016, 09/21/2016, 09/25/2017, 09/18/2018  . Influenza-Unspecified 10/13/2014  . Pneumococcal Conjugate-13 02/11/2014  . Pneumococcal Polysaccharide-23 05/05/2009  . Td 05/05/2009    Past Medical History:  Diagnosis Date  . Anemia   . B12 DEFICIENCY 03/31/2008  . COPD 11/27/2008  . ESOPHAGEAL STRICTURE 01/29/2009  . MASS, LUNG 10/13/2009  . NASAL FRACTURE 08/12/2009  . OSTEOARTHRITIS 03/31/2008   Hips  . OSTEOPOROSIS 12/27/2007  . PEDAL EDEMA 12/27/2007  . Pneumonia 07/10/2016  . PULMONARY NODULE 10/21/2009  . RESPIRATORY FAILURE, CHRONIC 06/29/2010  . Shortness of breath dyspnea     Tobacco History: Social History   Tobacco Use  Smoking Status Former Smoker  . Packs/day: 0.50  . Years: 35.00  . Pack years: 17.50  . Last attempt to quit: 12/18/1978  . Years since quitting: 40.0  Smokeless Tobacco Never Used   Counseling given: Yes   Outpatient Encounter Medications as of 12/23/2018  Medication Sig  . Acetaminophen (TYLENOL) 325 MG CAPS Take 1 capsule by mouth every 6 (six) hours as needed.  Marland Kitchen albuterol (PROVENTIL) (2.5 MG/3ML) 0.083% nebulizer solution Take 3 mLs (2.5 mg total) by nebulization every 6 (six) hours as needed  for wheezing or shortness of breath.  . Calcium-Vitamin D-Vitamin K (VIACTIV) 500-500-40 MG-UNT-MCG CHEW Chew by mouth daily.  . Cholecalciferol 50000 units TABS Take 5,000 Units by mouth.  . cyanocobalamin (,VITAMIN B-12,) 1000 MCG/ML injection INJECT 1 ML INTO THE MUSCLE ONCE MONTHLY  . dextromethorphan-guaiFENesin (MUCINEX DM) 30-600 MG 12hr tablet Take 1 tablet by mouth 2 (two) times daily as needed (cough/congestion).  Marland Kitchen. doxycycline (VIBRAMYCIN) 100 MG capsule Take 100 mg by mouth 2 (two) times daily.  Marland Kitchen.  escitalopram (LEXAPRO) 5 MG tablet Take 1 tablet (5 mg total) by mouth daily.  . famotidine (PEPCID) 20 MG tablet One at bedtime  . mirtazapine (REMERON) 15 MG tablet Take 1 tablet (15 mg total) by mouth at bedtime.  . Multiple Vitamins-Minerals (ICAPS AREDS 2) CAPS Take 1 capsule by mouth daily.   . OXYGEN Oxygen 2lpm 24/7  . Polyethyl Glycol-Propyl Glycol (SYSTANE OP) Place 1 drop into both eyes daily.   Marland Kitchen. senna-docusate (SENOKOT-S) 8.6-50 MG tablet Take 1 tablet by mouth 2 (two) times daily as needed for mild constipation.  . Tiotropium Bromide Monohydrate (SPIRIVA RESPIMAT) 2.5 MCG/ACT AERS Inhale 2 puffs into the lungs daily.  Marland Kitchen. doxycycline (VIBRA-TABS) 100 MG tablet Take 1 tablet (100 mg total) by mouth 2 (two) times daily.   No facility-administered encounter medications on file as of 12/23/2018.      Review of Systems  Review of Systems  Constitutional: Negative.  Negative for chills and fever.  HENT: Negative.   Respiratory: Positive for cough and shortness of breath. Negative for wheezing.   Cardiovascular: Negative.  Negative for chest pain, palpitations and leg swelling.  Gastrointestinal: Negative.   Allergic/Immunologic: Negative.   Neurological: Negative.   Psychiatric/Behavioral: Negative.        Physical Exam  BP 124/68 (BP Location: Left Arm, Patient Position: Sitting, Cuff Size: Normal)   Pulse 72   Temp 98.4 F (36.9 C)   Ht 5\' 5"  (1.651 m)   Wt 107 lb (48.5 kg)   SpO2 90% Comment: on 2L pulse  BMI 17.81 kg/m   Wt Readings from Last 5 Encounters:  12/23/18 107 lb (48.5 kg)  11/04/18 105 lb (47.6 kg)  10/04/18 104 lb 6.4 oz (47.4 kg)  09/30/18 101 lb 6.4 oz (46 kg)  09/18/18 98 lb 3.2 oz (44.5 kg)     Physical Exam Vitals signs and nursing note reviewed.  Constitutional:      General: She is not in acute distress.    Appearance: She is well-developed.  Cardiovascular:     Rate and Rhythm: Normal rate and regular rhythm.  Pulmonary:      Effort: Pulmonary effort is normal. No respiratory distress.     Breath sounds: Normal breath sounds. No wheezing or rhonchi.  Neurological:     Mental Status: She is alert and oriented to person, place, and time.      Imaging: Dg Chest 2 View  Result Date: 12/23/2018 CLINICAL DATA:  Cough, bronchiectasis EXAM: CHEST - 2 VIEW COMPARISON:  09/18/2018 FINDINGS: Changes of bronchiectasis noted within the lungs. Diffuse interstitial prominence. No confluent opacities or effusions. Heart is mildly enlarged. No acute bony abnormality. IMPRESSION: Chronic changes with bronchiectasis.  No definite acute process. Electronically Signed   By: Charlett NoseKevin  Dover M.D.   On: 12/23/2018 16:28     Assessment & Plan:   COPD GOLD III  Will treat for possible underlying URI.   Patient Instructions  Will order doxycycline Will order stat chest x ray  and call with results Continue mucinex May take delsym Will give flutter valve Continue Spiriva and dulera Continue O2 at 2 L [place] - may increase to keep sats above 88% Follow up with Dr. Sherene SiresWert at his first available appointment or sooner if needed    Chronic respiratory failure with hypoxia [facility](HCC) Patient Instructions  Will order doxycycline Will order stat chest x ray and call with results Continue mucinex May take delsym Will give flutter valve Continue Spiriva and dulera Continue O2 at 2 L [place] - may increase to keep sats above 88% Follow up with Dr. Sherene SiresWert at his first available appointment or sooner if needed    Bronchiectasis with acute exacerbation [facility](HCC) Patient Instructions  Will order doxycycline Will order stat chest x ray and call with results Continue mucinex May take delsym Will give flutter valve Continue Spiriva and dulera Continue O2 at 2 L  - may increase to keep sats above 88% Follow up with Dr. Sherene SiresWert at his first available appointment or sooner if needed       Ivonne Andrewonya S Kanyia Heaslip, NP 12/24/2018

## 2018-12-23 NOTE — Telephone Encounter (Signed)
Ov with all meds first available but don't overbook for me - ok to see NP

## 2018-12-23 NOTE — Telephone Encounter (Signed)
Spoke with pt's son Susy Frizzle, scheduled to see Tonya today at 2:30.  Nothing further needed.

## 2018-12-23 NOTE — Patient Instructions (Addendum)
Will order doxycycline Will order stat chest x ray and call with results Continue mucinex May take delsym Will give flutter valve Continue Spiriva and dulera Continue O2 at 2 L  - may increase to keep sats above 88% Follow up with Dr. Sherene Sires at his first available appointment or sooner if needed

## 2018-12-23 NOTE — Telephone Encounter (Signed)
Called and spoke with pt's son Susy Frizzle. Susy Frizzle stated that pt has been coughing a lot and cough is mainly a dry cough but pt has gotten up some saliva which is clear in color.   With min. Exertion, pt's O2 sats have been dropping quickly. [name] stated when pt goes from bed to bathroom, which is about 5-6 steps, pt's sats go from 91-92% on her 2L down to the 80's and will continue to drop down to the 70's and pt will begin to gasp for air.  [name] stated that when this happens, they do bump pt's O2 from 2L up to 4L to help get sats back up but he states that this usually takes about 48mn to get pt's sats back up.  Susy Frizzle stated this has been going on for awhile now as it was stated at last OV with MW 11/04/18 but it was determined that pt was improperly using her inhalers. Per Susy Frizzle, pt had begun to properly using her inhalers and was beginning to have a positive impact from her inhalers to the point that she did begin to exercise again.  [name] stated within the last 3 weeks, pt has progressively become worse and is doing a lot of coughing throughout the night. [name] also stated that pt is mainly just either lying around or sitting in her chair now and not doing much of what she had been previously able to do.  Dr. Sherene Sires, please advise on recs for both pt and son Susy Frizzle. Thanks!

## 2018-12-23 NOTE — Telephone Encounter (Signed)
Attempted to contact Melinda with [facility]. I did not receive an answer. I have left a message for her to return our call.  

## 2018-12-24 ENCOUNTER — Encounter: Payer: Self-pay | Admitting: Nurse Practitioner

## 2018-12-24 ENCOUNTER — Telehealth: Payer: Self-pay | Admitting: Internal Medicine

## 2018-12-24 DIAGNOSIS — J471 Bronchiectasis with (acute) exacerbation: Secondary | ICD-10-CM

## 2018-12-24 HISTORY — DX: Bronchiectasis with (acute) exacerbation: J47.1

## 2018-12-24 NOTE — Addendum Note (Signed)
Addended by: Ander Slade on: 12/24/2018 09:19 AM   Modules accepted: Orders

## 2018-12-24 NOTE — Assessment & Plan Note (Signed)
Will treat for possible underlying URI.   Patient Instructions  Will order doxycycline Will order stat chest x ray and call with results Continue mucinex May take delsym Will give flutter valve Continue Spiriva and dulera Continue O2 at 2 L [place] - may increase to keep sats above 88% Follow up with Dr. Sherene Sires at his first available appointment or sooner if needed

## 2018-12-24 NOTE — Assessment & Plan Note (Signed)
Patient Instructions  Will order doxycycline Will order stat chest x ray and call with results Continue mucinex May take delsym Will give flutter valve Continue Spiriva and dulera Continue O2 at 2 L [place] - may increase to keep sats above 88% Follow up with Dr. Wert at his first available appointment or sooner if needed   

## 2018-12-24 NOTE — Telephone Encounter (Signed)
Spoke with patient's son Sharon [name]; he's on DPR to speak too He was concerned about pt's tank that was brought in office yesterday Spoke with Annabelle Harman for clarity on the tank concerns The tank was small 2L tank that would not go any higher then 2L  He advised pt's O2 sats are dropping, advised he needs to move it up one liter to stay above 90% Advised him that Annabelle Harman placed order for a new and more tanks for pt to go out of the home that is more than 2L Pt's son stated that on AVS  Patient Instructions  Will order doxycycline Will order stat chest x ray and call with results Continue mucinex May take delsym Will give flutter valve Continue Spiriva and dulera Continue O2 at 2 L [place] - may increase to keep sats above 88% Follow up with Dr. Sherene Sires at his first available appointment or sooner if needed Scheduled appt for pt next week 12/31/2018 at 2pm per son's request. Pt's son verbalized understanding and had no further concerns Nothing further needed.

## 2018-12-24 NOTE — Assessment & Plan Note (Signed)
Patient Instructions  Will order doxycycline Will order stat chest x ray and call with results Continue mucinex May take delsym Will give flutter valve Continue Spiriva and dulera Continue O2 at 2 L [place] - may increase to keep sats above 88% Follow up with Dr. Sherene Sires at his first available appointment or sooner if needed

## 2018-12-24 NOTE — Telephone Encounter (Signed)
Spoke with [place] with [facility]. She is going to refax this form to [place] to have MW sign. Will await fax.

## 2018-12-25 NOTE — Telephone Encounter (Signed)
Leslie - has this form been received? Thanks. 

## 2018-12-25 NOTE — Telephone Encounter (Signed)
Still never received fax  Left a detailed msg for melinda asking her to refax again to triage [phone]

## 2018-12-25 NOTE — Telephone Encounter (Signed)
Spoke with Sharon [name] and asked her to fax to triage [phone] as I never received original  Will await fax

## 2018-12-26 NOTE — Progress Notes (Signed)
Chart and office note reviewed in detail  > agree with a/p as outlined    

## 2018-12-26 NOTE — Telephone Encounter (Signed)
Form signed and faxed

## 2018-12-27 ENCOUNTER — Telehealth: Payer: Self-pay | Admitting: Internal Medicine

## 2018-12-27 NOTE — Telephone Encounter (Signed)
Spoke with Barbara Cower he wanted to [facility] on this patient and her oxygen. [facility] needed to pick up her POC because her liter flow changed and the POC cannot provide the liter flow she needs. They are going to swap the POC out for the regular tanks. Her son wanted to keep the POC but he was advised that it was required since she needs a higher liter flow. Barbara Cower wanted to let us know that if her son (POA) refuses the exchange, that would be his choice even tho it wouldn't give her what she needs. He then asked could he change DME companies so he may need an order but will await his request for this. This is just FYI.

## 2018-12-31 ENCOUNTER — Encounter: Payer: Self-pay | Admitting: Internal Medicine

## 2018-12-31 ENCOUNTER — Ambulatory Visit: Payer: Federal, State, Local not specified - PPO | Admitting: Internal Medicine

## 2018-12-31 VITALS — BP 140/68 | HR 76 | Ht 65.0 in | Wt 105.0 lb

## 2018-12-31 DIAGNOSIS — J9611 Chronic respiratory failure with hypoxia: Secondary | ICD-10-CM

## 2018-12-31 DIAGNOSIS — J449 Chronic obstructive pulmonary disease, unspecified: Secondary | ICD-10-CM | POA: Diagnosis not present

## 2018-12-31 DIAGNOSIS — J9612 Chronic respiratory failure with hypercapnia: Secondary | ICD-10-CM | POA: Diagnosis not present

## 2018-12-31 MED ORDER — MOMETASONE FURO-FORMOTEROL FUM 200-5 MCG/ACT IN AERO: INHALATION_SPRAY | RESPIRATORY_TRACT | 11 refills | Status: DC

## 2018-12-31 MED ORDER — ALBUTEROL SULFATE (2.5 MG/3ML) 0.083% IN NEBU: 2.5000 mg | INHALATION_SOLUTION | RESPIRATORY_TRACT | 2 refills | Status: DC | PRN

## 2018-12-31 NOTE — Patient Instructions (Addendum)
Please see patient coordinator before you leave today  to schedule BEST fit for ambulatory and humdified 02    See calendar for specific medication instructions and bring it back for each and every office visit for every healthcare provider you see.  Without it,  you may not receive the best quality medical care that we feel you deserve.  You will note that the calendar groups together  your maintenance  medications that are timed at particular times of the day.  Think of this as your checklist for what your doctor has instructed you to do until your next evaluation to see what benefit  there is  to staying on a consistent group of medications intended to keep you well.  The other group at the bottom is entirely up to you to use as you see fit  for specific symptoms that may arise between visits that require you to treat them on an as needed basis.  Think of this as your action plan or "what if" list.   Separating the top medications from the bottom group is fundamental to providing you adequate care going forward.       Return as scheduled

## 2018-12-31 NOTE — Progress Notes (Signed)
Subjective:   Patient ID: Sharon [name], female    DOB: 10/21/1923     MRN: [id number]   Brief patient profile:  89    yowf quit smoking in 1980 with no minimal chronic doe assoc mild chronic nodular infiltrates dating back to 11/23/2008 with documented GOLD III criteria copd/ bronchiectasis by CT 09/2009 both lower lobes      Admit date: 07/09/2016 Discharge date: 07/12/2016  Discharge Diagnoses:  Principal Problem:   CAP (community acquired pneumonia)   Depression   Community acquired pneumonia   COPD (chronic obstructive pulmonary disease) with emphysema (HCC)   Sepsis (HCC)   Acute respiratory failure with hypoxia (HCC)   Hyponatremia      08/16/2016 1st [facility] office visit/Epic era/ Grizel Vesely  Re s/p CAP in pt with bronchiectasis/ GOLD III COPD  Chief Complaint  Patient presents with  . Pulmonary Consult    Referred by Dr. Eleonore ChiquitoPeter Kwiatkowski for pleural effusion. Pt states that she has been dxed with PNA x 2 since July 2017. She has had cough and SOB. Cough is currently non prod.   baseline = MMRC2 = can't walk a nl pace on a flat grade s sob but does fine slow and flat eg [employer] shopping/ uses [facility] parking on [address] 160 2bid and rare need for combivent  Acutely ill with hoarseness in [place] p getting off plan from [place] then dry cough and admit: Now on 02 at 3lpm 24/7 rec Please see patient coordinator before you leave today  to schedule venous doppler asap on Left> neg  Left  thoracentesis 08/17/16 =  1 liter on Left  WBC 1,020  L > P  Transudate with nl glucose/ cyt neg       12/24/2017  Extended acute  ov/Dayn Barich re: re-establish   COPD GOLD III with bronchiectasis  And prior tranusdative efffuson  Chief Complaint  Patient presents with  . Acute Visit    SOB with exertion, non productive cough more in the morning and with talking,nasal congestion,left foot swelling  solid food / pill dysphagia Sleeps left side down horizontally  Worse doe x 07/2017 rx   dulera 200 2bid helped some but still needing combivent maybe once or whice a day but not noct  Doe now = MMRC3 = can't walk 100 yards even at a slow pace at a flat grade s stopping due to sob  ? On approp 02?  rec Work on inhaler technique:  relax and gently blow all the way out then take a nice smooth deep breath back in, triggering the inhaler at same time you start breathing in.  Hold for up to 5 seconds if you can. Blow out thru nose. Rinse and gargle with water when done. Prednisone 10 mg take  4 each am x 2 days,   2 each am x 2 days,  1 each am x 2 days and stop  Pantoprazole (protonix) 40 mg   Take  30-60 min before first meal of the day and Pepcid (famotidine)  20 mg one @  bedtime until return to office - this is the best way to tell whether stomach acid is contributing to your problem.   GERD   We will have [facility] do a best fit evaluation for your 02 needs        09/18/2018  f/u ov/Keldrick Pomplun re:  GOLD III/ bronchiectasis, no longer able to use devices  Chief Complaint  Patient presents with  . Follow-up  Pt states she tires easily. She has not been walking much at all for the past month. She has had prod cough with green sputum for several months.   Dyspnea:  Across the room on up to 4lpm Cough: worse x several months, on doxy for skin infection Sleeping: bed flat, on back 2 pillows SABA use: neb every few days 02: 2lpm at rest   rec Plan A = Automatic = Brovana  And Yupelri each am  And in evening @ 12 hours later  just the Brovana   Plan B = Backup only use your albuterol nebulizer if you first try Plan B and it fails to help > ok to use the nebulizer up to every 4 hours but if start needing it regularly call for immediate appointment Finish your antibotics  For cough > max dose of mucinex or mucinex is 1200 mg every 12 hours Please see patient coordinator before you leave today  to schedule home pulmonary rehab per [facility]         10/04/18  NP  Edmonia JamesFinish Yupelri and  DunbarBrovana , once done resume.  Spiriva 2 puffs daily  And Dulera 2 puffs Twice daily  (Rinse after use)  Follow med calendar closely and bring to each visit.  Continue on Oxygen 2l/m      11/04/2018  f/u ov/Veryl Winemiller re: GOLD III criteria / maint on dulera/ spiriva doing as well as on neb equivalents  Chief Complaint  Patient presents with  . Follow-up    Breathing is unchanged. She has not been using her neb often.   Dyspnea:  Room to  Room on walking  and dropping on 2lpm  Cough: just in am  Sleeping: flat bed/ 2 pillows SABA use: not using much at all  02: 2lpm 24/7  rec Goal with walking is to keep the 02 sats over 90% and if POC not adequate then need continuous flow.  Call if not able to keep over 90% with your level of activity then call me to arrange best fit for ambulatory 02      12/31/2018  f/u ov/Ryon Layton re: GOLD III COPD spirometry / 02 dep / bronchiectasis/ just maint on spiriva now   Chief Complaint  Patient presents with  . Follow-up  Dyspnea:  50 ft on 3lpm home conc Cough: much less now  Sleeping: lie flat/ 2 pillows SABA use: no longer using duoneb  02: 2- 3lpm 24/7    No obvious day to day or daytime variability or assoc excess/ purulent sputum or mucus plugs or hemoptysis or cp or chest tightness, subjective wheeze or overt sinus or hb symptoms.   Sleeping well now  without nocturnal  or early am exacerbation  of respiratory  c/o's or need for noct saba. Also denies any obvious fluctuation of symptoms with weather or environmental changes or other aggravating or alleviating factors except as outlined above   No unusual exposure hx or h/o childhood pna/ asthma or knowledge of premature birth.  Current Allergies, Complete Past Medical History, Past Surgical History, Family History, and Social History were reviewed in [facility] electronic medical record.  ROS  The following are not active complaints unless bolded Hoarseness, sore throat, dysphagia, dental  problems, itching, sneezing,  nasal congestion and dryness this winter on non-humdified 02 or discharge of excess mucus or purulent secretions, ear ache,   fever, chills, sweats, unintended wt loss or wt gain, classically pleuritic or exertional cp,  orthopnea pnd or arm/hand swelling  or leg swelling, presyncope, palpitations, abdominal pain, anorexia, nausea, vomiting, diarrhea  or change in bowel habits or change in bladder habits, change in stools or change in urine, dysuria, hematuria,  rash, arthralgias, visual complaints, headache, numbness, weakness or ataxia or problems with walking or coordination,  change in mood or  memory.        Current Meds  Medication Sig  . Acetaminophen (TYLENOL) 325 MG CAPS Take 1 capsule by mouth every 6 (six) hours as needed.  Marland Kitchen albuterol (PROVENTIL) (2.5 MG/3ML) 0.083% nebulizer solution Take 3 mLs (2.5 mg total) by nebulization every 6 (six) hours as needed for wheezing or shortness of breath.  . Calcium-Vitamin D-Vitamin K (VIACTIV) 500-500-40 MG-UNT-MCG CHEW Chew by mouth daily.  . Cholecalciferol 50000 units TABS Take 5,000 Units by mouth.  . cyanocobalamin (,VITAMIN B-12,) 1000 MCG/ML injection INJECT 1 ML INTO THE MUSCLE ONCE MONTHLY  . dextromethorphan-guaiFENesin (MUCINEX DM) 30-600 MG 12hr tablet Take 1 tablet by mouth 2 (two) times daily as needed (cough/congestion).  Marland Kitchen escitalopram (LEXAPRO) 5 MG tablet Take 1 tablet (5 mg total) by mouth daily.  . famotidine (PEPCID) 20 MG tablet One at bedtime  . mirtazapine (REMERON) 15 MG tablet Take 1 tablet (15 mg total) by mouth at bedtime.  . Multiple Vitamins-Minerals (ICAPS AREDS 2) CAPS Take 1 capsule by mouth daily.   . OXYGEN Oxygen 2-3lpm 24/7  . Polyethyl Glycol-Propyl Glycol (SYSTANE OP) Place 1 drop into both eyes daily.   Marland Kitchen senna-docusate (SENOKOT-S) 8.6-50 MG tablet Take 1 tablet by mouth 2 (two) times daily as needed for mild constipation.  . Tiotropium Bromide Monohydrate (SPIRIVA RESPIMAT) 2.5  MCG/ACT AERS Inhale 2 puffs into the lungs daily.                 Past Medical History: Osteoporosis B12 deficiency Osteoarthritis COPD    - PFT's December 03, 2009 FEV1 67 (40%) with 12% response to B2 and DLC0 55%    - HFA 25% June 29, 2010 > 75% August 11, 2010 > 75% November 03, 2010  Fx Right Hip and required ORIF June 19th 2011  history of esophageal stricture...........................................Marland KitchenPatterson Multiple pulmonary nodules..................................................Marland KitchenWert    -CT chest 10/13/09 assoc with bilateral lower lobe bronchiectasis              Objective:   Physical Exam    w/c bound elderly thin wf nad   12/31/2018       105  11/04/2018      105  09/18/2018        98  04/22/2018           96  12/24/2017           96   08/16/16 102 lb (46.3 kg)  08/15/16 101 lb 4 oz (45.9 kg)  07/28/16 99 lb 12.8 oz (45.3 kg)     Vital signs reviewed - Note on arrival 02 sats  99% on 3lpm  Continuous      HEENT: nl dentition / oropharynx. Nl external ear canals without cough reflex -  Moderate bilateral non-specific turbinate swelling/ dryness  NECK :  without JVD/Nodes/TM/ nl carotid upstrokes bilaterally   LUNGS: no acc muscle use,  Mild barrel  contour chest wall with bilateral  Distant bs s audible wheeze and  without cough on insp or exp maneuver and mild  Hyperresonant  to  percussion bilaterally     CV:  RRR  no s3 or murmur or increase in P2, and trace  pitting L >R both LEs  ABD:  soft and nontender with pos mid insp Hoover's  in the supine position. No bruits or organomegaly appreciated, bowel sounds nl  MS:      ext warm without deformities, calf tenderness, cyanosis or clubbing No obvious joint restrictions   SKIN: warm and dry without lesions    NEURO:  alert, approp, nl sensorium with  no motor or cerebellar deficits apparent.           I personally reviewed images and agree with radiology impression as follows:  CXR:    12/23/2018 Chronic changes with bronchiectasis.  No definite acute process.          Assessment & Plan:

## 2019-01-01 ENCOUNTER — Encounter: Payer: Self-pay | Admitting: Internal Medicine

## 2019-01-01 DIAGNOSIS — J9611 Chronic respiratory failure with hypoxia: Secondary | ICD-10-CM | POA: Insufficient documentation

## 2019-01-01 DIAGNOSIS — J9612 Chronic respiratory failure with hypercapnia: Secondary | ICD-10-CM

## 2019-01-01 NOTE — Assessment & Plan Note (Signed)
Quit smoking 1980 -  PFT's December 03, 2009 FEV1 67 (40%) with 12% response to B2 and DLC0 55% - 12/24/2017  After extensive coaching inhaler device  effectiveness =    50% from a baseline of 25%  Did not do any better on neb laba/lama and appears to be doing just as well on spiriva  - The proper method of use, as well as anticipated side effects, of a smooth mist  inhaler were discussed and demonstrated to the patient.  Advised to bring hers on next ov to confirm still using correctly

## 2019-01-01 NOTE — Assessment & Plan Note (Addendum)
On 02 2lpm since d/c from CAP 07/12/16 - ono RA  10/17/16  desat < 88% x 114 min so rec 2lpm hs to continue  -12/24/2017 referred for Best fit for 02 > done 01/02/18 did not qualify for POC  > desat to 84 on setting of 6 pulsed  - HC03   09/30/18 = 34    Advised that due to hypercarbia really want to target sats in lower 90s so for now rec 2lpm 24/7 but ok to titrate up with activity to reach this goal and referred back to DME for best fit for ambulatory purposes and humidified 02 to help with nasal symptoms of dryness and congestion.     I had an extended discussion with the patient/fm  reviewing all relevant studies completed to date and  lasting 15 to 20 minutes of a 25 minute visit    See device teaching which extended face to face time for this visit.  Each maintenance medication was reviewed in detail including emphasizing most importantly the difference between maintenance and prns and under what circumstances the prns are to be triggered using an action plan format that is not reflected in the computer generated alphabetically organized AVS which I have not found useful in most complex patients, especially with respiratory illnesses  Please see AVS for specific instructions unique to this visit that I personally wrote and verbalized to the the pt in detail and then reviewed with pt  by my nurse highlighting any  changes in therapy recommended at today's visit to their plan of care.

## 2019-01-09 ENCOUNTER — Telehealth: Payer: Self-pay

## 2019-01-09 NOTE — Telephone Encounter (Signed)
Please advise 

## 2019-01-09 NOTE — Telephone Encounter (Signed)
Copied from CRM [id number]. Topic: General - Other >> Jan 09, 2019  4:29 PM Herby Abraham C wrote: Reason for CRM: pt's son Susy Frizzle called in to be advised. Pt use to see Dr. Kirtland Bouchard, Per son Dr. Kirtland Bouchard would follow up with pt every 2-3 months. Pt's son says that [facility] didn't advise on follow up visit, son would like to know when should they follow up with her?

## 2019-01-10 NOTE — Telephone Encounter (Signed)
Spoke with patient, appointment for 3 month follow up has been scheduled for 01/15/2019.

## 2019-01-10 NOTE — Telephone Encounter (Signed)
I was supposed to advise after bloodwork so I'm sorry! q 3 months is good which means she is due here in next month.

## 2019-01-15 ENCOUNTER — Ambulatory Visit: Payer: Federal, State, Local not specified - PPO | Admitting: Family Medicine

## 2019-01-15 ENCOUNTER — Encounter: Payer: Self-pay | Admitting: Family Medicine

## 2019-01-15 VITALS — BP 120/62 | HR 70

## 2019-01-15 DIAGNOSIS — R0981 Nasal congestion: Secondary | ICD-10-CM

## 2019-01-15 DIAGNOSIS — M810 Age-related osteoporosis without current pathological fracture: Secondary | ICD-10-CM

## 2019-01-15 DIAGNOSIS — D649 Anemia, unspecified: Secondary | ICD-10-CM | POA: Diagnosis not present

## 2019-01-15 DIAGNOSIS — E538 Deficiency of other specified B group vitamins: Secondary | ICD-10-CM | POA: Diagnosis not present

## 2019-01-15 DIAGNOSIS — J9611 Chronic respiratory failure with hypoxia: Secondary | ICD-10-CM | POA: Diagnosis not present

## 2019-01-15 LAB — COMPREHENSIVE METABOLIC PANEL
ALBUMIN: 3.9 g/dL (ref 3.5–5.2)
ALT: 16 U/L (ref 0–35)
AST: 17 U/L (ref 0–37)
Alkaline Phosphatase: 62 U/L (ref 39–117)
BUN: 17 mg/dL (ref 6–23)
CO2: 31 mEq/L (ref 19–32)
Calcium: 10.3 mg/dL (ref 8.4–10.5)
Chloride: 93 mEq/L — ABNORMAL LOW (ref 96–112)
Creatinine, Ser: 0.52 mg/dL (ref 0.40–1.20)
GFR: 109.87 mL/min (ref >60.00)
Glucose, Bld: 85 mg/dL (ref 70–99)
Potassium: 4.6 mEq/L (ref 3.5–5.1)
Sodium: 132 mEq/L — ABNORMAL LOW (ref 135–145)
Total Bilirubin: 0.3 mg/dL (ref 0.2–1.2)
Total Protein: 7.1 g/dL (ref 6.0–8.3)

## 2019-01-15 LAB — CBC WITH DIFFERENTIAL/PLATELET
Basophils Absolute: 0.1 10*3/uL (ref 0.0–0.1)
Basophils Relative: 0.7 % (ref 0.0–3.0)
Eosinophils Absolute: 0.3 10*3/uL (ref 0.0–0.7)
Eosinophils Relative: 3.3 % (ref 0.0–5.0)
HCT: 36.1 % (ref 36.0–46.0)
HEMOGLOBIN: 11.9 g/dL — AB (ref 12.0–15.0)
LYMPHS PCT: 18.7 % (ref 12.0–46.0)
Lymphs Abs: 1.8 10*3/uL (ref 0.7–4.0)
MCHC: 33 g/dL (ref 30.0–36.0)
MCV: 90.6 fl (ref 78.0–100.0)
Monocytes Absolute: 0.9 10*3/uL (ref 0.1–1.0)
Monocytes Relative: 9.3 % (ref 3.0–12.0)
NEUTROS ABS: 6.4 10*3/uL (ref 1.4–7.7)
Neutrophils Relative %: 68 % (ref 43.0–77.0)
Platelets: 386 10*3/uL (ref 150.0–400.0)
RBC: 3.98 Mil/uL (ref 3.87–5.11)
RDW: 14.5 % (ref 11.5–15.5)
WBC: 9.4 10*3/uL (ref 4.0–10.5)

## 2019-01-15 LAB — FOLATE: Folate: >24 ng/mL (ref >5.9)

## 2019-01-15 LAB — VITAMIN B12: Vitamin B-12: 508 pg/mL (ref 211–911)

## 2019-01-15 NOTE — Patient Instructions (Signed)
Try nasal gel saline twice daily.   If that is not effective, then consider flonase.

## 2019-01-15 NOTE — Progress Notes (Signed)
Sharon [name] DOB: [birth year] Encounter date: 01/15/2019  This is a 85 y.o. female who presents with No chief complaint on file.   History of present illness: Last pulm visit 1/15 with Dr. Sherene SiresWert. On 2L O2 pm since 06/2016. Desat on RA. Discussed target sats in lower 90's. Humidified O2 for nasal sx and congestion. Had bad respiratory infection in December so was following with them a little more often.   Last seen here 09/2018 to establish care. Thyroid wnl. Electrolytes stable. Hb slightly improved, but still with anemia. Not had hematology visit in past.   Nose is constantly stuffy, so difficult to breathe through. Wants to breathe through nose because she is on the oxygen. Oxygen is dropping because of this, esp with activity. Has tried mucinex. Have tried increasing the oxygen flow rate to 3L. Has new system now that can deliver this volume. Not not portable. Congestion has been issue for year for her. Even had surgery on nose but just didn't get better. Nose runs sometimes, but not running out. Hasn't tried nasal sprays.   Osteoporosis: states that DEXAs were done years ago in [place]. Was on pill for this in the past. (on record review, it was noted that she completed oral bisphosphonate tx for over 10 years so tx was stopped around 2014-5.  He feels tired all the time.  Cough is stable, but exhausting for her.  She has a difficult time with coughing especially when she lays down.  She has coughing fits in the middle of the night where she coughs for prolonged periods of time.  She is following regularly with pulmonology. She uses inhalers regularly.    Allergies  Allergen Reactions  . Penicillins Rash    Has patient had a PCN reaction causing immediate rash, facial/tongue/throat swelling, SOB or lightheadedness with hypotension: Yes Has patient had a PCN reaction causing severe rash involving mucus membranes or skin necrosis: Unknown Has patient had a PCN reaction that required  hospitalization: Unknown Has patient had a PCN reaction occurring within the last 10 years: No If all of the above answers are "NO", then may proceed with Cephalosporin use.    Current Meds  Medication Sig  . Acetaminophen (TYLENOL) 325 MG CAPS Take 1 capsule by mouth every 6 (six) hours as needed.  Marland Kitchen. albuterol (PROVENTIL) (2.5 MG/3ML) 0.083% nebulizer solution Take 3 mLs (2.5 mg total) by nebulization every 4 (four) hours as needed for wheezing or shortness of breath.  . Calcium-Vitamin D-Vitamin K (VIACTIV) 500-500-40 MG-UNT-MCG CHEW Chew by mouth daily.  . Cholecalciferol 50000 units TABS Take 5,000 Units by mouth.  . cyanocobalamin (,VITAMIN B-12,) 1000 MCG/ML injection INJECT 1 ML INTO THE MUSCLE ONCE MONTHLY  . dextromethorphan-guaiFENesin (MUCINEX DM) 30-600 MG 12hr tablet Take 1 tablet by mouth 2 (two) times daily as needed (cough/congestion).  Marland Kitchen. escitalopram (LEXAPRO) 5 MG tablet Take 1 tablet (5 mg total) by mouth daily.  . famotidine (PEPCID) 20 MG tablet One at bedtime  . mirtazapine (REMERON) 15 MG tablet Take 1 tablet (15 mg total) by mouth at bedtime.  . mometasone-formoterol (DULERA) 200-5 MCG/ACT AERO Take 2 puffs first thing in am and then another 2 puffs about 12 hours later.  . Multiple Vitamins-Minerals (ICAPS AREDS 2) CAPS Take 1 capsule by mouth daily.   . OXYGEN Oxygen 2-3lpm 24/7  . Polyethyl Glycol-Propyl Glycol (SYSTANE OP) Place 1 drop into both eyes daily.   Marland Kitchen. senna-docusate (SENOKOT-S) 8.6-50 MG tablet Take 1 tablet by mouth 2 (two)  times daily as needed for mild constipation.  . Tiotropium Bromide Monohydrate (SPIRIVA RESPIMAT) 2.5 MCG/ACT AERS Inhale 2 puffs into the lungs daily.    Review of Systems  Constitutional: Positive for activity change (unable to be very active with lung limitations), appetite change (decreased) and fatigue. Negative for chills and fever.  HENT: Positive for congestion. Negative for postnasal drip, sinus pressure and sinus pain.    Respiratory: Positive for cough and shortness of breath. Negative for chest tightness and wheezing.   Cardiovascular: Positive for leg swelling (improved from previous). Negative for chest pain and palpitations.    Objective:  BP 120/62 (BP Location: Left Arm, Patient Position: Sitting, Cuff Size: Normal)   Pulse 70   SpO2 99%       BP Readings from Last 3 Encounters:  01/15/19 120/62  12/31/18 140/68  12/23/18 124/68   Wt Readings from Last 3 Encounters:  12/31/18 105 lb (47.6 kg)  12/23/18 107 lb (48.5 kg)  11/04/18 105 lb (47.6 kg)    Physical Exam Constitutional:      General: She is not in acute distress.    Appearance: She is well-developed.  HENT:     Nose: Nose normal.     Right Nostril: No epistaxis or occlusion.     Left Nostril: No epistaxis or occlusion.     Right Turbinates: Pale. Not enlarged or swollen.     Left Turbinates: Pale. Not enlarged or swollen.     Comments: There is some crusting of nasal secretions and bilateral nares.  There is not notable turbinate hypertrophy. Cardiovascular:     Rate and Rhythm: Normal rate and regular rhythm.     Heart sounds: Normal heart sounds. No murmur. No friction rub.  Pulmonary:     Effort: Pulmonary effort is normal. No respiratory distress.     Breath sounds: Decreased air movement present. Decreased breath sounds present. No wheezing, rhonchi or rales.  Musculoskeletal:     Right lower leg: No edema (trace).     Left lower leg: No edema (trace).  Skin:    Comments: Senile purpura arms, hands. There are chronic venous stasis changes to bilateral lower extremities.  Skin is quite thin on lower extremities.  She has area of previous derma biopsy on the left lateral ankle that is healing, although slowly.  There is no significant erythema or warmth in this area.  Skin is intact, but there is central punctum where the wound was previously draining.  This is approximately 2 to 3 mm in size.  Neurological:     Mental  Status: She is alert and oriented to person, place, and time.  Psychiatric:        Behavior: Behavior normal.     Assessment/Plan 1. Chronic respiratory failure with hypoxia [facility]) She is following regularly with pulmonology.  See additional notes below.   - Comprehensive metabolic panel; Future - Comprehensive metabolic panel  2. Osteoporosis, unspecified osteoporosis type, unspecified pathological fracture presence She was treated for over 10 years with bisphosphonates.  Although I do not have previous DEXA results, treatment was stopped by previous physician with documentation of adequate historical treatment.    3. Vitamin B12 deficiency He is currently getting injections.  Will recheck a B12 level. - Vitamin B12; Future - Folate - Vitamin B12  4. Anemia, unspecified type We discussed doing some additional anemia studies today.  I feel there is any deficits, she would benefit from even subtle improvements in her anemia.  Follow-up for  change in treatment pending these results. - CBC with Differential/Platelet; Future - Iron, TIBC and Ferritin Panel - CBC with Differential/Platelet  5. Nasal congestion Recommended nasal saline gel twice a day to help keep out some of nasal congestion and hopefully open up nasal passages.  Additionally recommended using a modifier for oxygen as I think this will help keep the nose more moisturized and may help prevent some of the congestion she is experiencing.  We did discuss consideration for Flonase, but she does occasionally have nosebleeds and I worry that this would dry out her nose to excessively.  Additionally, she does not have large and boggy turbinates so I am not sure how much added benefit the Flonase would give.  Return in about 3 months (around 04/16/2019) for Chronic condition visit.    Theodis Shove, MD

## 2019-01-16 LAB — IRON,TIBC AND FERRITIN PANEL
%SAT: 14 % (calc) — ABNORMAL LOW (ref 16–45)
Ferritin: 49 ng/mL (ref 16–288)
Iron: 45 ug/dL (ref 45–160)
TIBC: 318 mcg/dL (calc) (ref 250–450)

## 2019-01-17 ENCOUNTER — Ambulatory Visit: Payer: Self-pay | Admitting: *Deleted

## 2019-01-17 NOTE — Telephone Encounter (Signed)
Patient's son reports pt received lab results today and was advised to take Ferrous Sulfate. He would like to know how much she needs to take, they picked up 65 mg. States Not sure of dosage or frequency.  Please advise:  [phone]    Reason for Disposition . Caller has NON-URGENT medication question about med that PCP prescribed and triager unable to answer question    OTC supplement.  Answer Assessment - Initial Assessment Questions 1. SYMPTOMS: "Do you have any symptoms?"     n0 2. SEVERITY: If symptoms are present, ask "Are they mild, moderate or severe?"     NA  Protocols used: MEDICATION QUESTION CALL-A-AH

## 2019-01-17 NOTE — Telephone Encounter (Signed)
Patient's son has been notified.

## 2019-01-17 NOTE — Telephone Encounter (Signed)
The 65mg  that they picked up is perfect (that is the standard dose). I would just recommend once weekly for this because I worry about constipation if they do more.

## 2019-01-17 NOTE — Telephone Encounter (Signed)
Please advise 

## 2019-02-05 ENCOUNTER — Ambulatory Visit: Payer: Federal, State, Local not specified - PPO | Admitting: Internal Medicine

## 2019-02-05 ENCOUNTER — Encounter: Payer: Self-pay | Admitting: Internal Medicine

## 2019-02-05 DIAGNOSIS — J9611 Chronic respiratory failure with hypoxia: Secondary | ICD-10-CM | POA: Diagnosis not present

## 2019-02-05 DIAGNOSIS — J449 Chronic obstructive pulmonary disease, unspecified: Secondary | ICD-10-CM

## 2019-02-05 DIAGNOSIS — J479 Bronchiectasis, uncomplicated: Secondary | ICD-10-CM | POA: Diagnosis not present

## 2019-02-05 DIAGNOSIS — J9612 Chronic respiratory failure with hypercapnia: Secondary | ICD-10-CM | POA: Diagnosis not present

## 2019-02-05 MED ORDER — TIOTROPIUM BROMIDE MONOHYDRATE 2.5 MCG/ACT IN AERS: 2.0000 | INHALATION_SPRAY | Freq: Every day | RESPIRATORY_TRACT | 0 refills | Status: DC

## 2019-02-05 NOTE — Patient Instructions (Signed)
Ok to adjust the 02 with walking if needed to keep 02 sats above 90%   For cough/ congestion >  mucinex dm up to 1200 mg every 12 hours and use the flutter valve as much as possible   See calendar for specific medication instructions and bring it back for each and every office visit for every healthcare provider you see.  Without it,  you may not receive the best quality medical care that we feel you deserve.  You will note that the calendar groups together  your maintenance  medications that are timed at particular times of the day.  Think of this as your checklist for what your doctor has instructed you to do until your next evaluation to see what benefit  there is  to staying on a consistent group of medications intended to keep you well.  The other group at the bottom is entirely up to you to use as you see fit  for specific symptoms that may arise between visits that require you to treat them on an as needed basis.  Think of this as your action plan or "what if" list.   Separating the top medications from the bottom group is fundamental to providing you adequate care going forward.     Please schedule a follow up visit in 3 months but call sooner if needed

## 2019-02-05 NOTE — Progress Notes (Signed)
Subjective:   Patient ID: Sharon [name], female    DOB: 10/11/1923     MRN: [id number]   Brief patient profile:  89  yowf quit smoking in 1980 with no minimal chronic doe assoc mild chronic nodular infiltrates dating back to 11/23/2008 with documented GOLD III criteria copd/ bronchiectasis by CT 09/2009 both lower lobes      Admit date: 07/09/2016 Discharge date: 07/12/2016  Discharge Diagnoses:  Principal Problem:   CAP (community acquired pneumonia)   Depression   Community acquired pneumonia   COPD (chronic obstructive pulmonary disease) with emphysema (HCC)   Sepsis (HCC)   Acute respiratory failure with hypoxia (HCC)   Hyponatremia      08/16/2016 1st [facility] office visit/Epic era/ Sharon [name]  Re s/p CAP in pt with bronchiectasis/ GOLD III COPD  Chief Complaint  Patient presents with  . Pulmonary Consult    Referred by Dr. Eleonore ChiquitoPeter Kwiatkowski for pleural effusion. Pt states that she has been dxed with PNA x 2 since July 2017. She has had cough and SOB. Cough is currently non prod.   baseline = MMRC2 = can't walk a nl pace on a flat grade s sob but does fine slow and flat eg [employer] shopping/ uses [employer] parking on symbicort 160 2bid and rare need for combivent  Acutely ill with hoarseness in [place] p getting off plan from [place] then dry cough and admit: Now on 02 at 3lpm 24/7 rec Please see patient coordinator before you leave today  to schedule venous doppler asap on Left> neg  Left  thoracentesis 08/17/16 =  1 liter on Left  WBC 1,020  L > P  Transudate with nl glucose/ cyt neg       12/24/2017  Extended acute  ov/Sharon [name] re: re-establish   COPD GOLD III with bronchiectasis  And prior tranusdative efffuson  Chief Complaint  Patient presents with  . Acute Visit    SOB with exertion, non productive cough more in the morning and with talking,nasal congestion,left foot swelling  solid food / pill dysphagia Sleeps left side down horizontally  Worse doe x 07/2017 rx   dulera 200 2bid helped some but still needing combivent maybe once or whice a day but not noct  Doe now = MMRC3 = can't walk 100 yards even at a slow pace at a flat grade s stopping due to sob  ? On approp 02?  rec Work on inhaler technique:  relax and gently blow all the way out then take a nice smooth deep breath back in, triggering the inhaler at same time you start breathing in.  Hold for up to 5 seconds if you can. Blow out thru nose. Rinse and gargle with water when done. Prednisone 10 mg take  4 each am x 2 days,   2 each am x 2 days,  1 each am x 2 days and stop  Pantoprazole (protonix) 40 mg   Take  30-60 min before first meal of the day and Pepcid (famotidine)  20 mg one @  bedtime until return to office - this is the best way to tell whether stomach acid is contributing to your problem.   GERD   We will have [facility] do a best fit evaluation for your 02 needs        09/18/2018  f/u ov/Sharon [name] re:  GOLD III/ bronchiectasis, no longer able to use devices  Chief Complaint  Patient presents with  . Follow-up    Pt states  she tires easily. She has not been walking much at all for the past month. She has had prod cough with green sputum for several months.   Dyspnea:  Across the room on up to 4lpm Cough: worse x several months, on doxy for skin infection Sleeping: bed flat, on back 2 pillows SABA use: neb every few days 02: 2lpm at rest   rec Plan A = Automatic = Brovana  And Yupelri each am  And in evening @ 12 hours later  just the Brovana   Plan B = Backup only use your albuterol nebulizer if you first try Plan B and it fails to help > ok to use the nebulizer up to every 4 hours but if start needing it regularly call for immediate appointment Finish your antibotics  For cough > max dose of mucinex or mucinex is 1200 mg every 12 hours Please see patient coordinator before you leave today  to schedule home pulmonary rehab per [facility]         10/04/18  NP  [name] and  [place] , once done resume.  Spiriva 2 puffs daily  And Dulera 2 puffs Twice daily  (Rinse after use)  Follow med calendar closely and bring to each visit.  Continue on Oxygen 2l/m      11/04/2018  f/u ov/Sharon [name] re: GOLD III criteria / maint on dulera/ spiriva doing as well as on neb equivalents  Chief Complaint  Patient presents with  . Follow-up    Breathing is unchanged. She has not been using her neb often.   Dyspnea:  Room to  Room on walking  and dropping on 2lpm  Cough: just in am  Sleeping: flat bed/ 2 pillows SABA use: not using much at all  02: 2lpm 24/7  rec Goal with walking is to keep the 02 sats over 90% and if POC not adequate then need continuous flow.  Call if not able to keep over 90% with your level of activity then call me to arrange best fit for ambulatory 02      12/31/2018  f/u ov/Sharon [name] re: GOLD III COPD spirometry / 02 dep / bronchiectasis/ just maint on spiriva now   Chief Complaint  Patient presents with  . Follow-up  Dyspnea:  50 ft on 3lpm home conc Cough: much less now  Sleeping: lie flat/ 2 pillows SABA use: no longer using duoneb  02: 2- 3lpm 24/7  rec Please see patient coordinator before you leave today  to schedule BEST fit for ambulatory and humdified 02  See calendar for specific medication instructions and bring it back for each and every office visit for every healthcare provider you see.  Without it,  you may not receive the best quality medical care that we feel you deserve.   02/05/2019  f/u ov/Sharon [name] re:  Copd III COPD spirometry/ 02 dep/ bronhiectasis / maint on dulera and spiriva  Chief Complaint  Patient presents with  . Follow-up    Breathing is unchanged. She rarely uses her albuterol neb.  Dyspnea:  Room to room on 3lpm  Cough: some flares in am / no excess mucus Sleeping: flat bed 2 pillows  SABA use: no ned for neb  02: 3 lpm    No obvious day to day or daytime variability or assoc excess/ purulent sputum or mucus plugs or  hemoptysis or cp or chest tightness, subjective wheeze or overt sinus or hb symptoms.   Sleeping  without nocturnal  or early am  exacerbation  of respiratory  c/o's or need for noct saba. Also denies any obvious fluctuation of symptoms with weather or environmental changes or other aggravating or alleviating factors except as outlined above   No unusual exposure hx or h/o childhood pna/ asthma or knowledge of premature birth.  Current Allergies, Complete Past Medical History, Past Surgical History, Family History, and Social History were reviewed in [employer] record.  ROS  The following are not active complaints unless bolded Hoarseness, sore throat, dysphagia, dental problems, itching, sneezing,  nasal congestion or discharge of excess mucus or purulent secretions, ear ache,   fever, chills, sweats, unintended wt loss or wt gain, classically pleuritic or exertional cp,  orthopnea pnd or arm/hand swelling  or leg swelling, presyncope, palpitations, abdominal pain, anorexia, nausea, vomiting, diarrhea  or change in bowel habits or change in bladder habits, change in stools or change in urine, dysuria, hematuria,  rash, arthralgias, visual complaints, headache, numbness, weakness or ataxia or problems with walking or coordination,  change in mood or  memory.        Current Meds  Medication Sig  . Acetaminophen (TYLENOL) 325 MG CAPS Take 1 capsule by mouth every 6 (six) hours as needed.  Marland Kitchen albuterol (PROVENTIL) (2.5 MG/3ML) 0.083% nebulizer solution Take 3 mLs (2.5 mg total) by nebulization every 4 (four) hours as needed for wheezing or shortness of breath.  . Calcium-Vitamin D-Vitamin K (VIACTIV) 500-500-40 MG-UNT-MCG CHEW Chew by mouth daily.  . Cholecalciferol 50000 units TABS Take 5,000 Units by mouth.  . cyanocobalamin (,VITAMIN B-12,) 1000 MCG/ML injection INJECT 1 ML INTO THE MUSCLE ONCE MONTHLY  . dextromethorphan-guaiFENesin (MUCINEX DM) 30-600 MG 12hr tablet Take 1  tablet by mouth 2 (two) times daily as needed (cough/congestion).  Marland Kitchen escitalopram (LEXAPRO) 5 MG tablet Take 1 tablet (5 mg total) by mouth daily.  . famotidine (PEPCID) 20 MG tablet One at bedtime  . mirtazapine (REMERON) 15 MG tablet Take 1 tablet (15 mg total) by mouth at bedtime.  . mometasone-formoterol (DULERA) 200-5 MCG/ACT AERO Take 2 puffs first thing in am and then another 2 puffs about 12 hours later.  . Multiple Vitamins-Minerals (ICAPS AREDS 2) CAPS Take 1 capsule by mouth daily.   . OXYGEN Oxygen 3lpm 24/7  . Polyethyl Glycol-Propyl Glycol (SYSTANE OP) Place 1 drop into both eyes daily.   Marland Kitchen senna-docusate (SENOKOT-S) 8.6-50 MG tablet Take 1 tablet by mouth 2 (two) times daily as needed for mild constipation.  . Tiotropium Bromide Monohydrate (SPIRIVA RESPIMAT) 2.5 MCG/ACT AERS Inhale 2 puffs into the lungs daily.          Past Medical History: Osteoporosis B12 deficiency Osteoarthritis COPD    - PFT's December 03, 2009 FEV1 67 (40%) with 12% response to B2 and DLC0 55%    - HFA 25% June 29, 2010 > 75% August 11, 2010 > 75% November 03, 2010  Fx Right Hip and required ORIF June 19th 2011  history of esophageal stricture...........................................Marland KitchenPatterson Multiple pulmonary nodules..................................................Marland KitchenWert    -CT chest 10/13/09 assoc with bilateral lower lobe bronchiectasis              Objective:  Physical Exam   W/c bound elderly wf nad   02/05/2019       106  12/31/2018       105  11/04/2018      105  09/18/2018        98  04/22/2018  96  12/24/2017           96   08/16/16 102 lb (46.3 kg)  08/15/16 101 lb 4 oz (45.9 kg)  07/28/16 99 lb 12.8 oz (45.3 kg)     Vital signs reviewed - Note on arrival 02 sats  95% on 3lpm cont      W/c bound elderly wf nad    HEENT: nl dentition / oropharynx. Nl external ear canals without cough reflex -  Mild bilateral non-specific turbinate edema     NECK :   without JVD/Nodes/TM/ nl carotid upstrokes bilaterally   LUNGS: no acc muscle use,  Mod barrel  contour chest wall with bilateral  Distant bs s audible wheeze and  without cough on insp or exp maneuver and mod  Hyperresonant  to  percussion bilaterally     CV:  RRR  no s3 or murmur or increase in P2, and trace to 1+  bilateral LE edema despite tight elastic hose  ABD:  soft and nontender with pos mid insp Hoover's  in the supine position. No bruits or organomegaly appreciated, bowel sounds nl  MS:   Very slow gait   ext warm without deformities, calf tenderness, cyanosis or clubbing No obvious joint restrictions   SKIN: warm and dry without lesions    NEURO:  alert, approp, nl sensorium with  no motor or cerebellar deficits apparent.                     Assessment & Plan:

## 2019-02-10 ENCOUNTER — Encounter: Payer: Self-pay | Admitting: Internal Medicine

## 2019-02-10 MED ORDER — TIOTROPIUM BROMIDE MONOHYDRATE 2.5 MCG/ACT IN AERS: 2.0000 | INHALATION_SPRAY | Freq: Every day | RESPIRATORY_TRACT | 0 refills | Status: DC

## 2019-02-10 NOTE — Assessment & Plan Note (Addendum)
See CT chest 09/2009   No recent flares/ no change rx/ action plans reviewed on med calendar provided    I had an extended discussion with the patient reviewing all relevant studies completed to date and  lasting 15 to 20 minutes of a 25 minute visit    See device teaching which extended face to face time for this visit   See amb 02 study which I personally observed as well.   Each maintenance medication was reviewed in detail including most importantly the difference between maintenance and prns and under what circumstances the prns are to be triggered using an action plan format that is not reflected in the computer generated alphabetically organized AVS but trather by a customized med calendar that reflects the AVS meds with confirmed 100% correlation.   In addition, Please see AVS for unique instructions that I personally wrote and verbalized to the the pt in detail and then reviewed with pt  by my nurse highlighting any  changes in therapy recommended at today's visit to their plan of care.

## 2019-02-10 NOTE — Assessment & Plan Note (Addendum)
On 02 2lpm since d/c from CAP 07/12/16 - ono RA  10/17/16  desat < 88% x 114 min so rec 2lpm hs to continue  -12/24/2017 referred for Best fit for 02 > done 01/02/18 did not qualify for POC  > desat to 84 on setting of 6 pulsed  - HC03   09/30/18 = 34  - 01/22/19 walking sats on pulse of up to 6 still desats so did not qualify for 02 saving device   - 02/05/2019   Walked 3lpm cont  =  approx  50 ft - stopped due to  Sob s desats at slow pace with legs weak   Adequate control on present rx, reviewed in detail with pt > no change in rx needed  = 3lpm cont 24/7

## 2019-02-10 NOTE — Assessment & Plan Note (Signed)
Quit smoking 1980 -  PFT's December 03, 2009 FEV1 67 (40%) with 12% response to B2 and DLC0 55%  - 02/05/2019  After extensive coaching inhaler device,  effectiveness =    75% from a baseline of 50% with hfa and smi     Group D in terms of symptom/risk and laba/lama/ICS  therefore appropriate rx at this point >>>  Continue dulera/ spiriva but work on optimizing hfa/smi techniques or consider alternative in neb form

## 2019-04-16 ENCOUNTER — Other Ambulatory Visit: Payer: Self-pay

## 2019-04-16 ENCOUNTER — Encounter: Payer: Self-pay | Admitting: Family Medicine

## 2019-04-16 ENCOUNTER — Ambulatory Visit (INDEPENDENT_AMBULATORY_CARE_PROVIDER_SITE_OTHER): Payer: Federal, State, Local not specified - PPO | Admitting: Family Medicine

## 2019-04-16 VITALS — BP 130/62 | HR 88 | Temp 98.8°F

## 2019-04-16 DIAGNOSIS — R059 Cough, unspecified: Secondary | ICD-10-CM

## 2019-04-16 DIAGNOSIS — J9611 Chronic respiratory failure with hypoxia: Secondary | ICD-10-CM | POA: Diagnosis not present

## 2019-04-16 DIAGNOSIS — R1310 Dysphagia, unspecified: Secondary | ICD-10-CM | POA: Diagnosis not present

## 2019-04-16 DIAGNOSIS — Z9981 Dependence on supplemental oxygen: Secondary | ICD-10-CM

## 2019-04-16 DIAGNOSIS — R05 Cough: Secondary | ICD-10-CM | POA: Diagnosis not present

## 2019-04-16 DIAGNOSIS — J9612 Chronic respiratory failure with hypercapnia: Secondary | ICD-10-CM

## 2019-04-16 DIAGNOSIS — R1319 Other dysphagia: Secondary | ICD-10-CM

## 2019-04-16 MED ORDER — DOXYCYCLINE HYCLATE 50 MG PO CAPS: 100.0000 mg | ORAL_CAPSULE | Freq: Two times a day (BID) | ORAL | 0 refills | Status: DC

## 2019-04-16 NOTE — Progress Notes (Signed)
Virtual Visit via Video Note  I connected with Sharon [name]  on 04/16/19 at  1:30 PM EDT by a video enabled telemedicine application and verified that I am speaking with the correct person using two identifiers.  Location patient: home Location provider:work or home office Persons participating in the virtual visit: patient, provider  I discussed the limitations of evaluation and management by telemedicine and the availability of in person appointments. The patient expressed understanding and agreed to proceed.   Sharon [name] DOB: 05-13-1923 Encounter date: 04/16/2019  This is a 85 y.o. female who presents with No chief complaint on file.   History of present illness: Last visit with Dr. Sherene Sires was 02/05/19: continue with O2 and ok to adjust to keep O2 sats over 90%; cont with mucinex as needed along with flutter valve.  Last visit with me was 01/15/19. Complained of nasal congestion at that time. Started nasal saline gel and recommended using humidified O2.  HPI  Extremely tired today. Just not feeling well. Son states that she started feeling bad about a week and a half ago. Has had ups and down days. Her pretty bad days are more obvious by amount of coughing she is doing. Coughing is always there, but has been worse and around the clock with more mucous and more overnight. More foamy looking. Green mucous. She is needing 4L around clock to maintain and it is about 90 but will drop off dramatically with any exertion. 30 steps back to bathroom and O2 will drop into low 80's or high 70's. It is quick on rebound once she rests again. Moving from lying position to sitting or standing position she starts coughing more.   She does have chills, but no fever. Last temp was 98.9. Haven't noted fever.   She does have her baseline nasal congestion but not worse than normal.   Had appt with Dr. Sherene Sires just before the COVID19 pandemic and she has follow up scheduled in about 3 weeks.   As spasms  has increased she has also been using the albuterol neb which does seem to help. Coughs during entire neb. She does feel more short of breath than normal.   spiriva and dulera tend to keep symptoms stable.   Has had some acid reflux which seems to respond to pepcid; just wondering if they could do this twice daily.   Noting some rattle after showers (daugher notes this)  Hard time eating; coughing inhibits her from eating well. She slept all day a couple of days ago so missed breakfast 2 days ago; but otherwise is trying to get meals in. Appetite is down, but she is working on eating. Has to take small bites due to narrow esophagus so has to eat carefully. Takes her a long time to eat a meal this way. Tried to eat a lot of fruit.    Allergies  Allergen Reactions  . Penicillins Rash    Has patient had a PCN reaction causing immediate rash, facial/tongue/throat swelling, SOB or lightheadedness with hypotension: Yes Has patient had a PCN reaction causing severe rash involving mucus membranes or skin necrosis: Unknown Has patient had a PCN reaction that required hospitalization: Unknown Has patient had a PCN reaction occurring within the last 10 years: No If all of the above answers are "NO", then may proceed with Cephalosporin use.    Current Meds  Medication Sig  . Acetaminophen (TYLENOL) 325 MG CAPS Take 1 capsule by mouth every 6 (six) hours as  needed.  Marland Kitchen albuterol (PROVENTIL) (2.5 MG/3ML) 0.083% nebulizer solution Take 3 mLs (2.5 mg total) by nebulization every 4 (four) hours as needed for wheezing or shortness of breath.  . Calcium-Vitamin D-Vitamin K (VIACTIV) 500-500-40 MG-UNT-MCG CHEW Chew by mouth daily.  . Cholecalciferol 50000 units TABS Take 5,000 Units by mouth.  . cyanocobalamin (,VITAMIN B-12,) 1000 MCG/ML injection INJECT 1 ML INTO THE MUSCLE ONCE MONTHLY  . dextromethorphan-guaiFENesin (MUCINEX DM) 30-600 MG 12hr tablet Take 1 tablet by mouth 2 (two) times daily as needed  (cough/congestion).  Marland Kitchen escitalopram (LEXAPRO) 5 MG tablet Take 1 tablet (5 mg total) by mouth daily.  . famotidine (PEPCID) 20 MG tablet One at bedtime  . mirtazapine (REMERON) 15 MG tablet Take 1 tablet (15 mg total) by mouth at bedtime.  . mometasone-formoterol (DULERA) 200-5 MCG/ACT AERO Take 2 puffs first thing in am and then another 2 puffs about 12 hours later.  . Multiple Vitamins-Minerals (ICAPS AREDS 2) CAPS Take 1 capsule by mouth daily.   . OXYGEN Oxygen 3lpm 24/7  . Polyethyl Glycol-Propyl Glycol (SYSTANE OP) Place 1 drop into both eyes daily.   Marland Kitchen senna-docusate (SENOKOT-S) 8.6-50 MG tablet Take 1 tablet by mouth 2 (two) times daily as needed for mild constipation.  . Tiotropium Bromide Monohydrate (SPIRIVA RESPIMAT) 2.5 MCG/ACT AERS Inhale 2 puffs into the lungs daily.    Review of Systems  Constitutional: Positive for activity change (not able to do much), appetite change and fatigue. Negative for chills and fever.  HENT: Positive for congestion. Negative for sinus pressure and sinus pain.   Respiratory: Positive for cough and shortness of breath. Negative for chest tightness and wheezing.   Cardiovascular: Negative for chest pain, palpitations and leg swelling.    Objective:  BP 130/62 Comment: taken by pt-jaf  Pulse 88 Comment: taken by pt-jaf  Temp 98.8 F (37.1 C) Comment: taken by pt-jaf  SpO2 99%       BP Readings from Last 3 Encounters:  04/16/19 130/62  02/05/19 108/64  01/15/19 120/62   Wt Readings from Last 3 Encounters:  02/05/19 106 lb (48.1 kg)  12/31/18 105 lb (47.6 kg)  12/23/18 107 lb (48.5 kg)    EXAM:  GENERAL: she appears tired. She is alert, oriented, no acute distress  HEENT: atraumatic, conjunctiva clear, no obvious abnormalities on inspection of external nose and ears  NECK: normal movements of the head and neck  LUNGS: breathing rate appears increased, she coughs frequently during conversation. She is wearing O2 currently.  CV: no  obvious cyanosis  MS: moves all visible extremities without noticeable abnormality  PSYCH/NEURO: pleasant and cooperative, no obvious depression or anxiety, speech and thought processing grossly intact  Assessment/Plan  1. Cough I am concerned for secondary bacterial infection.  We will cover with doxycycline which she has tolerated well in the past.  I encouraged her to continue use of her regular inhalers as well as albuterol nebulizers twice daily.  Encouraged her to use her flutter device, which she has not been using regularly.  I did review a website patient handout that explains proper use of the flutter device since they had questions regarding this.  Let us know if any worsening of symptoms otherwise we will check in with her on Friday and again next week to ensure she is improving. - doxycycline (VIBRAMYCIN) 50 MG capsule; Take 2 capsules (100 mg total) by mouth 2 (two) times daily.  Dispense: 28 capsule; Refill: 0  2. Chronic respiratory failure with  hypoxia and hypercapnia (HCC) Follows with pulmonology.  She does have an appointment later this month.  I have encouraged her to continue with checking oxygen levels and monitoring symptoms.  3. Esophageal dysphagia She manages this by taking small bites and making sure to chew food completely before swallowing.  Due to issues with swallowing, smaller sized antibiotic was requested.  I encouraged them to check the size and make sure she will be able to swallow before picking up from pharmacy.  4. Supplemental oxygen dependent currently on 4 L of oxygen continuously.    Return if symptoms worsen or fail to improve.   I discussed the assessment and treatment plan with the patient. The patient was provided an opportunity to ask questions and all were answered. The patient agreed with the plan and demonstrated an understanding of the instructions.   The patient was advised to call back or seek an in-person evaluation if the symptoms  worsen or if the condition fails to improve as anticipated.  I provided 32 minutes of non-face-to-face time during this encounter.   Theodis ShoveJunell Koberlein, MD

## 2019-04-18 ENCOUNTER — Telehealth: Payer: Self-pay | Admitting: *Deleted

## 2019-04-18 NOTE — Telephone Encounter (Signed)
I left a detailed message for the pts son to return my call.

## 2019-04-18 NOTE — Telephone Encounter (Signed)
Mr Baumgarner, pts son called back and was informed of the message below. In regards to Prednsione, he stated he is reluctant to start any new medications at this point as he does not recall her taking this previously and agreed to call pulmonary.  Mr Simpson stated the pt has an appt coming up with Dr Sherene Sires and I advised him I did not see this in her apt and he agreed to call.

## 2019-04-18 NOTE — Telephone Encounter (Signed)
-----   Message from Wynn Banker, MD sent at 04/16/2019  2:09 PM EDT ----- Please check in on Friday with her and see how breathing and cough are doing?

## 2019-04-18 NOTE — Telephone Encounter (Signed)
Ask them to check in with pulmonology at this point. I would like their opinion if they are able to connect with them.   I would suggest keeping her in more upright position where she is comfortable. Antibiotic has just been started so it will take some time to kick in, but I also want them to really closely monitor.   If they feel like she is worsening, she needs to be evaluated. She is already trying to function with decreased lung capacity and has been sick for some time now, so she is using up a lot of reserves.   If she has done well with prednisone burst in past, we could do 40mg  daily x 5 days to help open up chest some. I haven't given her this in past, so I'm not sure how she has tolerated before.

## 2019-04-18 NOTE — Telephone Encounter (Signed)
Patients son called back and stated the pt complains of a productive cough with clear-foamy looking sputum which is worse while lying down.  States she is not feeling well and thought she felt better until getting up, feels weak and tired with any activity even walking across the room.  Denies any shortness of breath, chills, nor fever and is taking Mucinex which makes her "burp" or feel uncomfortable.  Message sent to Dr Hassan Rowan.

## 2019-04-18 NOTE — Telephone Encounter (Signed)
-----   Message from Junell C Koberlein, MD sent at 04/16/2019  2:09 PM EDT ----- Please check in on Friday with her and see how breathing and cough are doing?  

## 2019-04-22 ENCOUNTER — Ambulatory Visit (INDEPENDENT_AMBULATORY_CARE_PROVIDER_SITE_OTHER): Payer: Federal, State, Local not specified - PPO

## 2019-04-22 ENCOUNTER — Ambulatory Visit: Payer: Federal, State, Local not specified - PPO

## 2019-04-22 ENCOUNTER — Ambulatory Visit: Payer: Federal, State, Local not specified - PPO | Admitting: Internal Medicine

## 2019-04-22 ENCOUNTER — Other Ambulatory Visit: Payer: Self-pay

## 2019-04-22 ENCOUNTER — Encounter: Payer: Self-pay | Admitting: Internal Medicine

## 2019-04-22 VITALS — BP 136/76 | HR 75 | Temp 97.7°F | Ht 65.0 in | Wt 106.0 lb

## 2019-04-22 DIAGNOSIS — J9611 Chronic respiratory failure with hypoxia: Secondary | ICD-10-CM

## 2019-04-22 DIAGNOSIS — J471 Bronchiectasis with (acute) exacerbation: Secondary | ICD-10-CM | POA: Diagnosis not present

## 2019-04-22 DIAGNOSIS — J9612 Chronic respiratory failure with hypercapnia: Secondary | ICD-10-CM | POA: Diagnosis not present

## 2019-04-22 DIAGNOSIS — J441 Chronic obstructive pulmonary disease with (acute) exacerbation: Secondary | ICD-10-CM

## 2019-04-22 MED ORDER — LEVOFLOXACIN 500 MG PO TABS: 500.0000 mg | ORAL_TABLET | Freq: Every day | ORAL | 0 refills | Status: DC

## 2019-04-22 MED ORDER — PREDNISONE 10 MG PO TABS: ORAL_TABLET | ORAL | 0 refills | Status: DC

## 2019-04-22 MED ORDER — PANTOPRAZOLE SODIUM 40 MG PO TBEC: 40.0000 mg | DELAYED_RELEASE_TABLET | Freq: Every day | ORAL | 2 refills | Status: DC

## 2019-04-22 NOTE — Patient Instructions (Addendum)
If green mucus persists or if condition worsens > levaquin 500 mg one daily x 7 days   Prednisone 10 mg take  4 each am x 2 days,   2 each am x 2 days,  1 each am x 2 days and stop    Please remember to go to the  x-ray department  for your tests - we will call you with the results when they are available     Whenever cough flares > guaifenesin= mucinex  1200  And  dm = dextromethorphan  60 mg =>   maximum dose  every 12 hours and use the flutter as much as possible     See Tammy NP in 6 weeks weeks with all your medications, even over the counter meds, separated in two separate bags, the ones you take no matter what vs the ones you stop once you feel better and take only as needed when you feel you need them.   Tammy  will generate for you a new user friendly medication calendar that will put [place] all on the same page re: your medication use.     Without this process, it simply isn't possible to assure that we are providing  your outpatient care  with  the attention to detail we feel you deserve.   If we cannot assure that you're getting that kind of care,  then we cannot manage your problem effectively from this clinic.  Once you have seen Tammy and we are sure that we're all on the same page with your medication use she will arrange follow up with me.  Add: She has a vague infiltrate in the right upper lobe and therefore asked to go ahead and complete the Levaquin as above and then follow-up with a chest x-ray in 4 weeks

## 2019-04-22 NOTE — Progress Notes (Signed)
Subjective:   Patient ID: Sharon [name], female    DOB: 01/23/1923     MRN: [id number]   Brief patient profile:  89  yowf quit smoking in 1980 with   minimal chronic doe assoc mild chronic nodular infiltrates dating back to 11/23/2008 with documented GOLD III criteria copd/ bronchiectasis by CT 09/2009 both lower lobes      Admit date: 07/09/2016 Discharge date: 07/12/2016  Discharge Diagnoses:  Principal Problem:   CAP (community acquired pneumonia)   Depression   Community acquired pneumonia   COPD (chronic obstructive pulmonary disease) with emphysema (HCC)   Sepsis (HCC)   Acute respiratory failure with hypoxia (HCC)   Hyponatremia      08/16/2016 1st  Pulmonary office visit/Epic era/   Re s/p CAP in pt with bronchiectasis/ GOLD III COPD  Chief Complaint  Patient presents with  . Pulmonary Consult    Referred by Dr. Eleonore Chiquito for pleural effusion. Pt states that she has been dxed with PNA x 2 since July 2017. She has had cough and SOB. Cough is currently non prod.   baseline = MMRC2 = can't walk a nl pace on a flat grade s sob but does fine slow and flat eg [employer] shopping/ uses [facility] parking on symbicort 160 2bid and rare need for combivent  Acutely ill with hoarseness in [place] p getting off plan from [place] then dry cough and admit: Now on 02 at 3lpm 24/7 rec Please see patient coordinator before you leave today  to schedule venous doppler asap on Left> neg  Left  thoracentesis 08/17/16 =  1 liter on Left  WBC 1,020  L > P  Transudate with nl glucose/ cyt neg       12/24/2017  Extended acute  ov/ re: re-establish   COPD GOLD III with bronchiectasis  And prior tranusdative efffuson  Chief Complaint  Patient presents with  . Acute Visit    SOB with exertion, non productive cough more in the morning and with talking,nasal congestion,left foot swelling  solid food / pill dysphagia Sleeps left side down horizontally  Worse doe x 07/2017 rx   dulera 200 2bid helped some but still needing combivent maybe once or whice a day but not noct  Doe now = MMRC3 = can't walk 100 yards even at a slow pace at a flat grade s stopping due to sob  ? On approp 02?  rec Work on inhaler technique:  relax and gently blow all the way out then take a nice smooth deep breath back in, triggering the inhaler at same time you start breathing in.  Hold for up to 5 seconds if you can. Blow out thru nose. Rinse and gargle with water when done. Prednisone 10 mg take  4 each am x 2 days,   2 each am x 2 days,  1 each am x 2 days and stop  Pantoprazole (protonix) 40 mg   Take  30-60 min before first meal of the day and Pepcid (famotidine)  20 mg one @  bedtime until return to office - this is the best way to tell whether stomach acid is contributing to your problem.   GERD   We will have [facility] do a best fit evaluation for your 02 needs        09/18/2018  f/u ov/ re:  GOLD III/ bronchiectasis, no longer able to use devices  Chief Complaint  Patient presents with  . Follow-up    Pt  states she tires easily. She has not been walking much at all for the past month. She has had prod cough with green sputum for several months.   Dyspnea:  Across the room on up to 4lpm Cough: worse x several months, on doxy for skin infection Sleeping: bed flat, on back 2 pillows SABA use: neb every few days 02: 2lpm at rest   rec Plan A = Automatic = Brovana  And Yupelri each am  And in evening @ 12 hours later  just the Brovana   Plan B = Backup only use your albuterol nebulizer if you first try Plan B and it fails to help > ok to use the nebulizer up to every 4 hours but if start needing it regularly call for immediate appointment Finish your antibotics  For cough > max dose of mucinex or mucinex is 1200 mg every 12 hours Please see patient coordinator before you leave today  to schedule home pulmonary rehab per [facility]         10/04/18  NP  Edmonia JamesFinish Yupelri and  MadisonBrovana , once done resume.  Spiriva 2 puffs daily  And Dulera 2 puffs Twice daily  (Rinse after use)  Follow med calendar closely and bring to each visit.  Continue on Oxygen 2l/m      11/04/2018  f/u ov/ re: GOLD III criteria / maint on dulera/ spiriva doing as well as on neb equivalents  Chief Complaint  Patient presents with  . Follow-up    Breathing is unchanged. She has not been using her neb often.   Dyspnea:  Room to  Room on walking  and dropping on 2lpm  Cough: just in am  Sleeping: flat bed/ 2 pillows SABA use: not using much at all  02: 2lpm 24/7  rec Goal with walking is to keep the 02 sats over 90% and if POC not adequate then need continuous flow.  Call if not able to keep over 90% with your level of activity then call me to arrange best fit for ambulatory 02      12/31/2018  f/u ov/ re: GOLD III COPD spirometry / 02 dep / bronchiectasis/ just maint on spiriva now   Chief Complaint  Patient presents with  . Follow-up  Dyspnea:  50 ft on 3lpm home conc Cough: much less now  Sleeping: lie flat/ 2 pillows SABA use: no longer using duoneb  02: 2- 3lpm 24/7  rec Please see patient coordinator before you leave today  to schedule BEST fit for ambulatory and humdified 02      02/05/2019  f/u ov/ re:  Copd III COPD spirometry/ 02 dep/ bronhiectasis / maint on dulera and spiriva  Chief Complaint  Patient presents with  . Follow-up    Breathing is unchanged. She rarely uses her albuterol neb.  Dyspnea:  Room to room on 3lpm  Cough: some flares in am / no excess mucus Sleeping: flat bed 2 pillows  SABA use: no ned for neb  02: 3 lpm  rec Ok to adjust the 02 with walking if needed to keep 02 sats above 90%   For cough/ congestion >  mucinex dm up to 1200 mg every 12 hours and use the flutter valve as much as possible    04/22/2019 acute extended ov/ re:  aecopd maint on duelra/ spiriva  Chief Complaint  Patient presents with  . Follow-up     Increased cough x 2 wks- on doxy per PCP. Her cough is  prod cough with foamy white to green sputum. She has been using her neb 2 x daily on average.   doxy 04/16/19 last dose 04/22/2019 less green but still def discolored esp in  am  Sleeping at 45 degrees ok  02 still 3lpm 24/7    No obvious day to day or daytime variability or assoc   mucus plugs or hemoptysis or cp or chest tightness, subjective wheeze or overt sinus or hb symptoms.    . Also denies any obvious fluctuation of symptoms with weather or environmental changes or other aggravating or alleviating factors except as outlined above   No unusual exposure hx or h/o childhood pna/ asthma or knowledge of premature birth.  Current Allergies, Complete Past Medical History, Past Surgical History, Family History, and Social History were reviewed in [employer] record.  ROS  The following are not active complaints unless bolded Hoarseness, sore throat, dysphagia, dental problems, itching, sneezing,  nasal congestion or discharge of excess mucus or purulent secretions, ear ache,   fever, chills, sweats, unintended wt loss or wt gain, classically pleuritic or exertional cp,  orthopnea pnd or arm/hand swelling  or leg swelling, presyncope, palpitations, abdominal pain, anorexia, nausea, vomiting, diarrhea  or change in bowel habits or change in bladder habits, change in stools or change in urine, dysuria, hematuria,  rash, arthralgias, visual complaints, headache, numbness, weakness or ataxia or problems with walking or coordination,  change in mood or  memory.        Current Meds  Medication Sig  . Acetaminophen (TYLENOL) 325 MG CAPS Take 1 capsule by mouth every 6 (six) hours as needed.  Marland Kitchen albuterol (PROVENTIL) (2.5 MG/3ML) 0.083% nebulizer solution Take 3 mLs (2.5 mg total) by nebulization every 4 (four) hours as needed for wheezing or shortness of breath.  . Calcium-Vitamin D-Vitamin K (VIACTIV) 500-500-40 MG-UNT-MCG CHEW  Chew by mouth daily.  . Cholecalciferol 50000 units TABS Take 5,000 Units by mouth.  . cyanocobalamin (,VITAMIN B-12,) 1000 MCG/ML injection INJECT 1 ML INTO THE MUSCLE ONCE MONTHLY  . dextromethorphan-guaiFENesin (MUCINEX DM) 30-600 MG 12hr tablet Take 1 tablet by mouth 2 (two) times daily as needed (cough/congestion).  Marland Kitchen doxycycline (VIBRAMYCIN) 50 MG capsule Take 2 capsules (100 mg total) by mouth 2 (two) times daily.  Marland Kitchen escitalopram (LEXAPRO) 5 MG tablet Take 1 tablet (5 mg total) by mouth daily.  . famotidine (PEPCID) 20 MG tablet One at bedtime  . mirtazapine (REMERON) 15 MG tablet Take 1 tablet (15 mg total) by mouth at bedtime.  . mometasone-formoterol (DULERA) 200-5 MCG/ACT AERO Take 2 puffs first thing in am and then another 2 puffs about 12 hours later.  . Multiple Vitamins-Minerals (ICAPS AREDS 2) CAPS Take 1 capsule by mouth daily.   . OXYGEN Oxygen 3lpm 24/7  . Polyethyl Glycol-Propyl Glycol (SYSTANE OP) Place 1 drop into both eyes daily.   Marland Kitchen senna-docusate (SENOKOT-S) 8.6-50 MG tablet Take 1 tablet by mouth 2 (two) times daily as needed for mild constipation.  . Tiotropium Bromide Monohydrate (SPIRIVA RESPIMAT) 2.5 MCG/ACT AERS Inhale 2 puffs into the lungs daily.               Past Medical History: Osteoporosis B12 deficiency Osteoarthritis COPD    - PFT's December 03, 2009 FEV1 67 (40%) with 12% response to B2 and DLC0 55%    - HFA 25% June 29, 2010 > 75% August 11, 2010 > 75% November 03, 2010  Fx Right Hip and required ORIF  June 19th 2011  history of esophageal stricture...........................................Marland KitchenPatterson Multiple pulmonary nodules..................................................Marland KitchenWert    -CT chest 10/13/09 assoc with bilateral lower lobe bronchiectasis              Objective:  Physical Exam  04/22/2019         106  02/05/2019       106  12/31/2018       105  11/04/2018      105  09/18/2018        98  04/22/2018           96  12/24/2017            96   08/16/16 102 lb (46.3 kg)  08/15/16 101 lb 4 oz (45.9 kg)  07/28/16 99 lb 12.8 oz (45.3 kg)      Vital signs reviewed - Note on arrival 02 sats  95% on  3lpm      W/c bound elderly slt hoarse wf     HEENT: nl dentition / oropharynx. Nl external ear canals without cough reflex -  Mild bilateral non-specific turbinate edema     NECK :  without JVD/Nodes/TM/ nl carotid upstrokes bilaterally   LUNGS: no acc muscle use,  Mild/mod barrel  contour chest wall with bilateral  Distant bs s audible wheeze and  without cough on insp or exp maneuver and mild/mod  Hyperresonant  to  percussion bilaterally     CV:  RRR  no s3 or murmur or increase in P2, and trace pitting both LE's   ABD:  soft and nontender with pos mid/late  insp Hoover's  in the supine position. No bruits or organomegaly appreciated, bowel sounds nl  MS:   Nl gait/  ext warm without deformities, calf tenderness, cyanosis or clubbing No obvious joint restrictions   SKIN: warm and dry without lesions    NEURO:  alert, approp, nl sensorium with  no motor or cerebellar deficits apparent.        CXR PA and Lateral:   04/22/2019 :    I personally reviewed images and agree with radiology impression as follows:   15 mm area of nodular airspace disease in the right upper lobe which may reflect developing round pneumonia versus a pulmonary nodule. My review: Not able to see anything on the corresponding lateral that would indicate a spherical lesion so most likely this is not a nodule.             Assessment & Plan:

## 2019-04-23 NOTE — Progress Notes (Signed)
Spoke with Sharon [name] and notified of recs per MW  She verbalized understanding  Rx was send and rov with cxr was scheduled

## 2019-04-24 ENCOUNTER — Encounter: Payer: Self-pay | Admitting: Internal Medicine

## 2019-04-24 NOTE — Assessment & Plan Note (Signed)
On 02 2lpm since d/c from CAP 07/12/16 - ono RA  10/17/16  desat < 88% x 114 min so rec 2lpm hs to continue  -12/24/2017 referred for Best fit for 02 > done 01/02/18 did not qualify for POC  > desat to 84 on setting of 6 pulsed  - HC03   09/30/18 = 34  - 01/22/19 walking sats on pulse of up to 6 still desats so did not qualify for 02 saving device  - 02/05/2019   Walked 3lpm cont  =  approx  50 ft - stopped due to  Sob s desats   Despite acute exacerbation she is well compensated on her present regimen which I did not change.

## 2019-04-24 NOTE — Assessment & Plan Note (Addendum)
Since she is already taken doxycycline and continues to have green sputum with a probable infiltrate in the right upper lobe most likely this is due to bronchiectasis exacerbation and appropriate therefore for Levaquin but only 5 mg x 7 days with caution re stopping if any tendinitis-like symptoms develop.    Discussed in detail all the  indications, usual  risks and alternatives  relative to the benefits with patient who agrees to proceed with rxas outlined.   >>> f/u with cxr in 4 weeks    I had an extended discussion with the patient and daughter Nicholos Johns reviewing all relevant studies completed to date and  lasting 15 to 20 minutes of a 25 minute acute office visit      Each maintenance medication was reviewed in detail including most importantly the difference between maintenance and as needed and under what circumstances the prns are to be used. This was done in the context of a medication calendar review which provided the patient with a user-friendly unambiguous mechanism for medication administration and reconciliation and provides an action plan for all active problems. It is critical that this be shown to every doctor  for modification during the office visit if necessary so the patient can use it as a working document.      Have asked her to return for follow-up to see our nurse practitioner for a new medicine calendar at the next routine  visit.

## 2019-04-24 NOTE — Assessment & Plan Note (Addendum)
Onset end April 2020 > rx doxy complete 04/22/19 > rx levaquin 04/23/19 x 500 mg/d x 7 days  And Prednisone 10 mg take  4 each am x 2 days,   2 each am x 2 days,  1 each am x 2 days and stop    Advised on the use of Mucinex products and the flutter valve.

## 2019-04-25 ENCOUNTER — Telehealth: Payer: Self-pay | Admitting: Internal Medicine

## 2019-04-25 NOTE — Telephone Encounter (Signed)
Called and spoke with Maisie Fus [facility]) who states pt has sensitivity on her heals to the point when someone touches them, she jumps in pain. Maisie Fus is not sure if this could be coming from the levaquin as Maisie Fus stated the sensitivity has been going on for months but it has gotten worse over the last few days. Maisie Fus stated after reading the warning that came with the levaquin, since the sensitivity has become worse, he wanted to make sure it is not due to her being on the levaquin and wants to make sure that it is okay for pt to still continue taking the Rx.  Dr. Sherene Sires, please advise on this for pt and Maisie Fus. Thanks!

## 2019-04-25 NOTE — Telephone Encounter (Signed)
If her heels are the problem and not the tendons (achilles) and the heels hurt before the first dose it's very unlikey this has anything to do with levaquin as she's only had a few doses but best bet is leave off for weekend and reassess p 48-72 hours off paying close attention to sensitive of her achilles/ not her heels, over the next few days and check back with [place] on Monday May 11

## 2019-04-25 NOTE — Telephone Encounter (Signed)
Called and spoke with Maisie Fus stating to him the info per MW and for pt not to take any more levaquin over the weekend and closely monitor sensitivity off of the levaquin to see if it is infact her heals or if it is coming from the achilles tendon. Stated to Maisie Fus to call us back on Monday 5/11 to reassess pt. Maisie Fus verbalized understanding. Nothing further needed.

## 2019-05-07 ENCOUNTER — Ambulatory Visit: Payer: Federal, State, Local not specified - PPO | Admitting: Internal Medicine

## 2019-05-20 ENCOUNTER — Ambulatory Visit: Payer: Federal, State, Local not specified - PPO

## 2019-05-20 ENCOUNTER — Other Ambulatory Visit: Payer: Self-pay

## 2019-05-20 DIAGNOSIS — J449 Chronic obstructive pulmonary disease, unspecified: Secondary | ICD-10-CM

## 2019-05-21 ENCOUNTER — Other Ambulatory Visit: Payer: Self-pay

## 2019-05-21 ENCOUNTER — Encounter: Payer: Self-pay | Admitting: Internal Medicine

## 2019-05-21 ENCOUNTER — Ambulatory Visit: Payer: Federal, State, Local not specified - PPO | Admitting: Internal Medicine

## 2019-05-21 ENCOUNTER — Ambulatory Visit (INDEPENDENT_AMBULATORY_CARE_PROVIDER_SITE_OTHER): Payer: Federal, State, Local not specified - PPO

## 2019-05-21 VITALS — BP 104/62 | HR 69 | Temp 98.0°F | Ht 65.0 in

## 2019-05-21 DIAGNOSIS — J9612 Chronic respiratory failure with hypercapnia: Secondary | ICD-10-CM | POA: Diagnosis not present

## 2019-05-21 DIAGNOSIS — J449 Chronic obstructive pulmonary disease, unspecified: Secondary | ICD-10-CM | POA: Diagnosis not present

## 2019-05-21 DIAGNOSIS — J471 Bronchiectasis with (acute) exacerbation: Secondary | ICD-10-CM | POA: Diagnosis not present

## 2019-05-21 DIAGNOSIS — J9611 Chronic respiratory failure with hypoxia: Secondary | ICD-10-CM

## 2019-05-21 MED ORDER — DOXYCYCLINE HYCLATE 100 MG PO TABS: 100.0000 mg | ORAL_TABLET | Freq: Two times a day (BID) | ORAL | 11 refills | Status: DC

## 2019-05-21 NOTE — Patient Instructions (Addendum)
Adjust the 02 to keep 02 sats over 90% otherwise leave at 3lpm  Change the Tammy NP appt to 3 months - call sooner if needed

## 2019-05-21 NOTE — Progress Notes (Signed)
Subjective:   Patient ID: Sharon [name], female    DOB: 07/17/1923     MRN: [id number]   Brief patient profile:  89  yowf quit smoking in 1980 with   minimal chronic doe assoc mild chronic nodular infiltrates dating back to 11/23/2008 with documented GOLD III criteria copd/ bronchiectasis by CT 09/2009 both lower lobes      Admit date: 07/09/2016 Discharge date: 07/12/2016  Discharge Diagnoses:  Principal Problem:   CAP (community acquired pneumonia)   Depression   Community acquired pneumonia   COPD (chronic obstructive pulmonary disease) with emphysema (HCC)   Sepsis (HCC)   Acute respiratory failure with hypoxia (HCC)   Hyponatremia      08/16/2016 1st  Pulmonary office visit/Epic era/ Wert  Re s/p CAP in pt with bronchiectasis/ GOLD III COPD  Chief Complaint  Patient presents with  . Pulmonary Consult    Referred by Dr. Eleonore Chiquito for pleural effusion. Pt states that she has been dxed with PNA x 2 since July 2017. She has had cough and SOB. Cough is currently non prod.   baseline = MMRC2 = can't walk a nl pace on a flat grade s sob but does fine slow and flat eg [employer] shopping/ uses [facility] parking on [address] 160 2bid and rare need for combivent  Acutely ill with hoarseness in [place] p getting off plan from [place] then dry cough and admit: Now on 02 at 3lpm 24/7 rec Please see patient coordinator before you leave today  to schedule venous doppler asap on Left> neg  Left  thoracentesis 08/17/16 =  1 liter on Left  WBC 1,020  L > P  Transudate with nl glucose/ cyt neg       12/24/2017  Extended acute  ov/Wert re: re-establish   COPD GOLD III with bronchiectasis  And prior tranusdative efffuson  Chief Complaint  Patient presents with  . Acute Visit    SOB with exertion, non productive cough more in the morning and with talking,nasal congestion,left foot swelling  solid food / pill dysphagia Sleeps left side down horizontally  Worse doe x 07/2017 rx   dulera 200 2bid helped some but still needing combivent maybe once or whice a day but not noct  Doe now = MMRC3 = can't walk 100 yards even at a slow pace at a flat grade s stopping due to sob  ? On approp 02?  rec Work on inhaler technique:  relax and gently blow all the way out then take a nice smooth deep breath back in, triggering the inhaler at same time you start breathing in.  Hold for up to 5 seconds if you can. Blow out thru nose. Rinse and gargle with water when done. Prednisone 10 mg take  4 each am x 2 days,   2 each am x 2 days,  1 each am x 2 days and stop  Pantoprazole (protonix) 40 mg   Take  30-60 min before first meal of the day and Pepcid (famotidine)  20 mg one @  bedtime until return to office - this is the best way to tell whether stomach acid is contributing to your problem.   GERD   We will have [facility] do a best fit evaluation for your 02 needs        09/18/2018  f/u ov/Wert re:  GOLD III/ bronchiectasis, no longer able to use devices  Chief Complaint  Patient presents with  . Follow-up    Pt  states she tires easily. She has not been walking much at all for the past month. She has had prod cough with green sputum for several months.   Dyspnea:  Across the room on up to 4lpm Cough: worse x several months, on doxy for skin infection Sleeping: bed flat, on back 2 pillows SABA use: neb every few days 02: 2lpm at rest   rec Plan A = Automatic = Brovana  And Yupelri each am  And in evening @ 12 hours later  just the Brovana   Plan B = Backup only use your albuterol nebulizer if you first try Plan B and it fails to help > ok to use the nebulizer up to every 4 hours but if start needing it regularly call for immediate appointment Finish your antibotics  For cough > max dose of mucinex or mucinex is 1200 mg every 12 hours Please see patient coordinator before you leave today  to schedule home pulmonary rehab per [facility]       10/04/18  NP  [name] and  [place] , once done resume.  Spiriva 2 puffs daily  And Dulera 2 puffs Twice daily  (Rinse after use)  Follow med calendar closely and bring to each visit.  Continue on Oxygen 2l/m     04/22/2019 acute extended ov/Wert re:  aecopd maint on dulera200/ spiriva  Already on doxy 4/29 Chief Complaint  Patient presents with  . Follow-up    Increased cough x 2 wks- on doxy per PCP. Her cough is prod cough with foamy white to green sputum. She has been using her neb 2 x daily on average.   doxy 04/16/19 last dose 04/22/2019 less green but still def discolored esp in  am  Sleeping at 45 degrees ok  02 still 3lpm 24/7  rec If green mucus persists or if condition worsens > levaquin 500 mg one daily x 7 days Prednisone 10 mg take  4 each am x 2 days,   2 each am x 2 days,  1 each am x 2 days and stop  Please remember to go to the  x-ray department  for your tests - we will call you with the results when they are available   Whenever cough flares > guaifenesin= mucinex  1200  And  dm = dextromethorphan  60 mg =>   maximum dose  every 12 hours and use the flutter as much as possible     05/21/2019  f/u ov/Wert re:  Copd III with bronhciectasis - f/u ? pna on last ov  Chief Complaint  Patient presents with  . Follow-up    Patient states still having productive cough with yellow sputum. Has not had to use rescue nebulizer in 2 weeks.    Dyspnea:  One room to next is all she can do even on  3lpm 24/7 / not checking sats Cough: minimal / not using flutter much at all now  Sleeping: 45 degrees electric bed  SABA use: none x 2 weeks 02: 3lpm 24/7    No obvious day to day or daytime variability or assoc excess/ purulent sputum or mucus plugs or hemoptysis or cp or chest tightness, subjective wheeze or overt sinus or hb symptoms.   Sleeping as above without nocturnal  or early am exacerbation  of respiratory  c/o's or need for noct saba. Also denies any obvious fluctuation of symptoms with weather or  environmental changes or other aggravating or alleviating factors except as outlined above  No unusual exposure hx or h/o childhood pna/ asthma or knowledge of premature birth.  Current Allergies, Complete Past Medical History, Past Surgical History, Family History, and Social History were reviewed in [employer] record.  ROS  The following are not active complaints unless bolded Hoarseness, sore throat, dysphagia, dental problems, itching, sneezing,  nasal congestion or discharge of excess mucus or purulent secretions, ear ache,   fever, chills, sweats, unintended wt loss or wt gain, classically pleuritic or exertional cp,  orthopnea pnd or arm/hand swelling  or leg swelling, presyncope, palpitations, abdominal pain, anorexia, nausea, vomiting, diarrhea  or change in bowel habits or change in bladder habits, change in stools or change in urine, dysuria, hematuria,  rash, arthralgias, visual complaints, headache, numbness, weakness or ataxia or problems with walking or coordination,  change in mood or  memory.        Current Meds  Medication Sig  . Acetaminophen (TYLENOL) 325 MG CAPS Take 1 capsule by mouth every 6 (six) hours as needed.  Marland Kitchen albuterol (PROVENTIL) (2.5 MG/3ML) 0.083% nebulizer solution Take 3 mLs (2.5 mg total) by nebulization every 4 (four) hours as needed for wheezing or shortness of breath.  . ALPRAZolam (XANAX) 0.25 MG tablet Take 0.25 mg by mouth 3 (three) times daily as needed for anxiety.  . Calcium-Vitamin D-Vitamin K (VIACTIV) 500-500-40 MG-UNT-MCG CHEW Chew by mouth daily.  . Cholecalciferol 50000 units TABS Take 5,000 Units by mouth.  . cyanocobalamin (,VITAMIN B-12,) 1000 MCG/ML injection INJECT 1 ML INTO THE MUSCLE ONCE MONTHLY  . dextromethorphan-guaiFENesin (MUCINEX DM) 30-600 MG 12hr tablet Take 1 tablet by mouth 2 (two) times daily as needed (cough/congestion).  Marland Kitchen escitalopram (LEXAPRO) 5 MG tablet Take 1 tablet (5 mg total) by mouth daily.   . famotidine (PEPCID) 20 MG tablet One at bedtime  . mirtazapine (REMERON) 15 MG tablet Take 1 tablet (15 mg total) by mouth at bedtime.  . mometasone-formoterol (DULERA) 200-5 MCG/ACT AERO Take 2 puffs first thing in am and then another 2 puffs about 12 hours later.  . OXYGEN Oxygen 3lpm 24/7  . pantoprazole (PROTONIX) 40 MG tablet Take 1 tablet (40 mg total) by mouth daily. Take 30-60 min before first meal of the day  . Polyethyl Glycol-Propyl Glycol (SYSTANE OP) Place 1 drop into both eyes daily.   Marland Kitchen senna-docusate (SENOKOT-S) 8.6-50 MG tablet Take 1 tablet by mouth 2 (two) times daily as needed for mild constipation.  . Tiotropium Bromide Monohydrate (SPIRIVA RESPIMAT) 2.5 MCG/ACT AERS Inhale 2 puffs into the lungs daily.             Past Medical History: Osteoporosis B12 deficiency Osteoarthritis COPD    - PFT's December 03, 2009 FEV1 67 (40%) with 12% response to B2 and DLC0 55%    - HFA 25% June 29, 2010 > 75% August 11, 2010 > 75% November 03, 2010  Fx Right Hip and required ORIF June 19th 2011  history of esophageal stricture...........................................Marland KitchenPatterson Multiple pulmonary nodules..................................................Marland KitchenWert    -CT chest 10/13/09 assoc with bilateral lower lobe bronchiectasis              Objective:  Physical Exam    05/21/2019         Did not weigh        04/22/2019         106  02/05/2019       106  12/31/2018       105  11/04/2018      105  09/18/2018        98  04/22/2018           96  12/24/2017           96   08/16/16 102 lb (46.3 kg)  08/15/16 101 lb 4 oz (45.9 kg)  07/28/16 99 lb 12.8 oz (45.3 kg)      W/c bound hoarse wf      HEENT: nl dentition / oropharynx. Nl external ear canals without cough reflex -  Mild bilateral non-specific turbinate edema     NECK :  without JVD/Nodes/TM/ nl carotid upstrokes bilaterally   LUNGS: no acc muscle use,  Mild/mod barrel contour and kyphotic chest wall with  bilateral  Distant bs s audible wheeze and  without cough on insp or exp maneuver and mild  Hyperresonant  to  percussion bilaterally     CV:  RRR  no s3 or murmur or increase in P2, and no edema   ABD:  soft and nontender with pos late insp Hoover's  in the supine position. No bruits or organomegaly appreciated, bowel sounds nl  MS:   Nl gait/  ext warm without deformities, calf tenderness, cyanosis or clubbing No obvious joint restrictions   SKIN: warm and dry without lesions    NEURO:  alert, approp, nl sensorium with  no motor or cerebellar deficits apparent.        CXR PA and Lateral:   05/21/2019 :    I personally reviewed images and agree with radiology impression as follows:    1. Interim clearing of rounded density in the right upper lung. No evidence of pneumonia.  2. Stable changes of chronic interstitial lung disease and pleural-parenchymal scarring         Assessment & Plan:

## 2019-05-27 ENCOUNTER — Encounter: Payer: Self-pay | Admitting: Internal Medicine

## 2019-05-27 NOTE — Assessment & Plan Note (Signed)
Quit smoking 1980 -  PFT's December 03, 2009 FEV1 67 (40%) with 12% response to B2 and DLC0 55% - 05/21/2019  After extensive coaching inhaler device,  effectiveness =    75%    Adequate control on present rx, reviewed in detail with pt > no change in rx needed

## 2019-05-27 NOTE — Assessment & Plan Note (Addendum)
On 02 2lpm since d/c from CAP 07/12/16 - ono RA  10/17/16  desat < 88% x 114 min so rec 2lpm hs to continue  -12/24/2017 referred for Best fit for 02 > done 01/02/18 did not qualify for POC  > desat to 84 on setting of 6 pulsed  - HC03   09/30/18 = 34  - 01/22/19 walking sats on pulse of up to 6 still desats so did not qualify for 02 saving device  - 02/05/2019   Walked 3lpm cont  =  approx  50 ft - stopped due to  Sob s desats    Needs re-eval for portable system / ambulatory 02 titration to see if eligible for POC or alternative though note is becoming more and more housebound with gen geriatric decline  so time will tell if this is benefit to her or not.

## 2019-05-27 NOTE — Assessment & Plan Note (Addendum)
See CT chest 09/2009  - 05/21/2019  rx doxycline 100 mg one twice daily x10 days prn flare  cxr  ? pna RUL > resolved   Improved on present rx with mucinex/ flutter > no change rx     I had an extended discussion with the patient/daughter reviewing all relevant studies completed to date and  lasting 15 to 20 minutes of a 25 minute visit    See device teaching which extended face to face time for this visit.  Each maintenance medication was reviewed in detail including emphasizing most importantly the difference between maintenance and prns and under what circumstances the prns are to be triggered using an action plan format that is not reflected in the computer generated alphabetically organized AVS which I have not found useful in most complex patients, especially with respiratory illnesses  Please see AVS for specific instructions unique to this visit that I personally wrote and verbalized to the the pt in detail and then reviewed with pt  by my nurse highlighting any  changes in therapy recommended at today's visit to their plan of care.

## 2019-05-27 NOTE — Assessment & Plan Note (Signed)
See CT chest 09/2009  - 05/21/2019  rx doxycline 100 mg one twice daily x10 days prn flare  Improved on present rx with mucinex/ flutter > no change rx

## 2019-06-04 ENCOUNTER — Ambulatory Visit: Payer: Federal, State, Local not specified - PPO | Admitting: Adult Health

## 2019-06-18 ENCOUNTER — Other Ambulatory Visit: Payer: Self-pay | Admitting: Family Medicine

## 2019-06-23 ENCOUNTER — Telehealth: Payer: Self-pay | Admitting: Family Medicine

## 2019-06-23 ENCOUNTER — Other Ambulatory Visit: Payer: Self-pay | Admitting: Family Medicine

## 2019-06-23 MED ORDER — ESCITALOPRAM OXALATE 5 MG PO TABS: 5.0000 mg | ORAL_TABLET | Freq: Every day | ORAL | 3 refills | Status: DC

## 2019-06-23 NOTE — Telephone Encounter (Signed)
done

## 2019-06-23 NOTE — Telephone Encounter (Signed)
Medication Refill - Medication:escitalopram (LEXAPRO) 5 MG tablet [[id number]]   escitalopram (LEXAPRO) 5 MG tablet [[id number]]    Has the patient contacted their pharmacy? yes  Preferred Pharmacy (with phone number or street name): [facility] [place], [place] [phone] (Phone) [phone] (Fax)     Agent: Please be advised that RX refills may take up to 3 business days. We ask that you follow-up with your pharmacy.

## 2019-06-27 ENCOUNTER — Ambulatory Visit: Payer: Federal, State, Local not specified - PPO | Admitting: Internal Medicine

## 2019-07-04 ENCOUNTER — Other Ambulatory Visit: Payer: Self-pay | Admitting: Family Medicine

## 2019-07-05 ENCOUNTER — Telehealth: Payer: Self-pay | Admitting: Family Medicine

## 2019-07-05 ENCOUNTER — Other Ambulatory Visit: Payer: Self-pay | Admitting: Family Medicine

## 2019-07-05 MED ORDER — SPIRIVA RESPIMAT 2.5 MCG/ACT IN AERS: 2.0000 | INHALATION_SPRAY | Freq: Every day | RESPIRATORY_TRACT | 11 refills | Status: DC

## 2019-07-05 NOTE — Telephone Encounter (Signed)
Last Spiriva rx from Dr. Melvyn Novas but we received call on PCP after hours line. I allowed 1x refill- forwarding to Dr. Melvyn Novas and Dr. Ethlyn Gallery- to decide who will manage ongoing after that or if patient needs additional refill.

## 2019-07-05 NOTE — Telephone Encounter (Signed)
Refilled x1 year

## 2019-07-05 NOTE — Progress Notes (Signed)
Last Spiriva rx from Dr. Wert but we received call on PCP after hours line. I allowed 1x refill- forwarding to Dr. Wert and Dr. Koberlein- to decide who will manage ongoing after that or if patient needs additional refill.  

## 2019-07-05 NOTE — Addendum Note (Signed)
Addended by: Christinia Gully B on: 07/05/2019 04:15 PM   Modules accepted: Orders

## 2019-07-18 ENCOUNTER — Telehealth: Payer: Self-pay | Admitting: *Deleted

## 2019-07-18 ENCOUNTER — Other Ambulatory Visit: Payer: Self-pay | Admitting: Family Medicine

## 2019-07-18 ENCOUNTER — Encounter: Payer: Self-pay | Admitting: Family Medicine

## 2019-07-18 ENCOUNTER — Other Ambulatory Visit: Payer: Self-pay

## 2019-07-18 ENCOUNTER — Ambulatory Visit (INDEPENDENT_AMBULATORY_CARE_PROVIDER_SITE_OTHER): Payer: Federal, State, Local not specified - PPO | Admitting: Family Medicine

## 2019-07-18 VITALS — HR 73 | Temp 98.0°F

## 2019-07-18 DIAGNOSIS — L89601 Pressure ulcer of unspecified heel, stage 1: Secondary | ICD-10-CM

## 2019-07-18 DIAGNOSIS — E538 Deficiency of other specified B group vitamins: Secondary | ICD-10-CM

## 2019-07-18 DIAGNOSIS — J449 Chronic obstructive pulmonary disease, unspecified: Secondary | ICD-10-CM | POA: Diagnosis not present

## 2019-07-18 DIAGNOSIS — F320 Major depressive disorder, single episode, mild: Secondary | ICD-10-CM | POA: Diagnosis not present

## 2019-07-18 DIAGNOSIS — F419 Anxiety disorder, unspecified: Secondary | ICD-10-CM | POA: Diagnosis not present

## 2019-07-18 DIAGNOSIS — R600 Localized edema: Secondary | ICD-10-CM

## 2019-07-18 DIAGNOSIS — Z9981 Dependence on supplemental oxygen: Secondary | ICD-10-CM | POA: Diagnosis not present

## 2019-07-18 MED ORDER — MIRTAZAPINE 15 MG PO TABS: 15.0000 mg | ORAL_TABLET | Freq: Every day | ORAL | 3 refills | Status: DC

## 2019-07-18 MED ORDER — ALPRAZOLAM 0.25 MG PO TABS: 0.2500 mg | ORAL_TABLET | Freq: Two times a day (BID) | ORAL | 0 refills | Status: DC | PRN

## 2019-07-18 MED ORDER — CYANOCOBALAMIN 1000 MCG/ML IJ SOLN: INTRAMUSCULAR | 3 refills | Status: DC

## 2019-07-18 MED ORDER — CLOTRIMAZOLE 1 % EX CREA: 1.0000 "application " | TOPICAL_CREAM | Freq: Two times a day (BID) | CUTANEOUS | 0 refills | Status: DC

## 2019-07-18 MED ORDER — FUROSEMIDE 20 MG PO TABS: 20.0000 mg | ORAL_TABLET | Freq: Every day | ORAL | 1 refills | Status: DC | PRN

## 2019-07-18 MED ORDER — PANTOPRAZOLE SODIUM 40 MG PO TBEC: 40.0000 mg | DELAYED_RELEASE_TABLET | Freq: Every day | ORAL | 3 refills | Status: DC

## 2019-07-18 MED ORDER — TRIAMCINOLONE ACETONIDE 0.1 % EX OINT: 1.0000 "application " | TOPICAL_OINTMENT | Freq: Two times a day (BID) | CUTANEOUS | 1 refills | Status: DC

## 2019-07-18 NOTE — Telephone Encounter (Signed)
I called [facility] and spoke with [place]. Aloha Gell stated the Rx for Spiriva was picked up on 7/18 and Dulera on 7/24 which were filled by Dr Melvyn Novas. I called the pts daughter and informed her daughter of this and advised she contact the pharmacist Tamera Punt  with further questions.

## 2019-07-18 NOTE — Telephone Encounter (Signed)
-----   Message from Caren Macadam, MD sent at 07/18/2019  3:10 PM EDT ----- Can you call her pharmacy and see if they have spiriva called by Dr. Melvyn Novas last week (shoud be like a year supply) and dulera (should be good for refills until 2021?). They were having issues getting these refilled. Thanks!

## 2019-07-18 NOTE — Progress Notes (Signed)
Virtual Visit via Video Note  I connected with Sharon AngerJoanne E Madera  on 07/19/19 at  2:00 PM EDT by a video enabled telemedicine application and verified that I am speaking with the correct person using two identifiers.  Location patient: home Location provider:work or home office Persons participating in the virtual visit: patient, provider  I discussed the limitations of evaluation and management by telemedicine and the availability of in person appointments. The patient expressed understanding and agreed to proceed.  Video feed not working properly so completed  Sharon [name] DOB: 08-Dec-1923 Encounter date: 07/18/2019  This is a 85 y.o. female who presents with Chief Complaint  Patient presents with  . Follow-up  . Cough    recurrent with dark green sputum "for a while", denies a fever or chills  . Foot Swelling    also complains of heel pain x1 year since left hip fracture    History of present illness: Temp: 98.0, O2 91. They were not able to get bp; squeezes too much.   Heels are tender, flaking. Has been going on for months. Did go to dermatologist and suggested cream OTC - peeling all the time. amlactin ultra smoothing ultra hydroxy therapy. Putting in soft socks when in bed - still has ridge of swelling above stocking line. Left foot swollen more than right foot but right foot hurts more than left. Heels are red, raw. Position her without heels touching bed/chair but this made it feel worse. No drainage from heels. Throb, feel raw.   Swelling in foot more in left than right leg. Swells at night. Wears compression stockings and elevates legs in bed. Has been over a year since she had hip surgery.   Dr. Sherene SiresWert suggested for renewing rx when mucous dark. Started doxycycline again 6/15 then off 6/25. Not better over weekend. Then restarted 7/1 and taken off 7/11. Didn't seem to do job completely. Helped a little bit. Not cleared. Cough more dry now. No fevers. Breathing didn't seem to  improve much with these courses.   O2 at 3 resting and then up to 6L with movement otherwise she is struggling. Really just sitting most of day. Doesn't like the flutter valve because it makes her cough; so she doesn't use this. Still taking mucinex. Follow up with Dr. Sherene SiresWert is 08/22/19.     Allergies  Allergen Reactions  . Penicillins Rash    Has patient had a PCN reaction causing immediate rash, facial/tongue/throat swelling, SOB or lightheadedness with hypotension: Yes Has patient had a PCN reaction causing severe rash involving mucus membranes or skin necrosis: Unknown Has patient had a PCN reaction that required hospitalization: Unknown Has patient had a PCN reaction occurring within the last 10 years: No If all of the above answers are "NO", then may proceed with Cephalosporin use.    Current Meds  Medication Sig  . Acetaminophen (TYLENOL) 325 MG CAPS Take 1 capsule by mouth every 6 (six) hours as needed.  Marland Kitchen. albuterol (PROVENTIL) (2.5 MG/3ML) 0.083% nebulizer solution Take 3 mLs (2.5 mg total) by nebulization every 4 (four) hours as needed for wheezing or shortness of breath.  . ALPRAZolam (XANAX) 0.25 MG tablet Take 1 tablet (0.25 mg total) by mouth 2 (two) times daily as needed for anxiety.  . Calcium-Vitamin D-Vitamin K (VIACTIV) 500-500-40 MG-UNT-MCG CHEW Chew by mouth daily.  . Cholecalciferol 50000 units TABS Take 5,000 Units by mouth.  . cyanocobalamin (,VITAMIN B-12,) 1000 MCG/ML injection Inject 1ml monthly into muscle  . dextromethorphan-guaiFENesin (  MUCINEX DM) 30-600 MG 12hr tablet Take 1 tablet by mouth 2 (two) times daily as needed (cough/congestion).  Marland Kitchen escitalopram (LEXAPRO) 5 MG tablet Take 1 tablet (5 mg total) by mouth daily.  . famotidine (PEPCID) 20 MG tablet One at bedtime  . mirtazapine (REMERON) 15 MG tablet Take 1 tablet (15 mg total) by mouth at bedtime.  . mometasone-formoterol (DULERA) 200-5 MCG/ACT AERO Take 2 puffs first thing in am and then another 2  puffs about 12 hours later.  . OXYGEN Oxygen 3lpm 24/7  . pantoprazole (PROTONIX) 40 MG tablet Take 1 tablet (40 mg total) by mouth daily. Take 30-60 min before first meal of the day  . Polyethyl Glycol-Propyl Glycol (SYSTANE OP) Place 1 drop into both eyes daily.   Marland Kitchen senna-docusate (SENOKOT-S) 8.6-50 MG tablet Take 1 tablet by mouth 2 (two) times daily as needed for mild constipation.  . Tiotropium Bromide Monohydrate (SPIRIVA RESPIMAT) 2.5 MCG/ACT AERS Inhale 2 puffs into the lungs daily.  . [DISCONTINUED] ALPRAZolam (XANAX) 0.25 MG tablet Take 0.25 mg by mouth 3 (three) times daily as needed for anxiety.  . [DISCONTINUED] cyanocobalamin (,VITAMIN B-12,) 1000 MCG/ML injection INJECT 1 ML INTO THE MUSCLE ONCE MONTHLY  . [DISCONTINUED] doxycycline (VIBRA-TABS) 100 MG tablet Take 1 tablet (100 mg total) by mouth 2 (two) times daily.  . [DISCONTINUED] mirtazapine (REMERON) 15 MG tablet Take 1 tablet (15 mg total) by mouth at bedtime.  . [DISCONTINUED] pantoprazole (PROTONIX) 40 MG tablet Take 1 tablet (40 mg total) by mouth daily. Take 30-60 min before first meal of the day    Review of Systems  Constitutional: Positive for activity change (decreased) and fatigue. Negative for appetite change, chills and fever.  HENT: Negative for congestion, sinus pressure and sinus pain.   Respiratory: Positive for cough and shortness of breath. Negative for chest tightness and wheezing.   Cardiovascular: Positive for leg swelling. Negative for chest pain and palpitations.  Skin: Positive for color change and rash.       Heels red, not peeling but skin sloughing. No blisters. Not warm. Skin feels like burning. No drainage. There is swelling bilat feet up to ankles. Heels tender, hurt with any pressure.    Objective:  Pulse 73   Temp 98 F (36.7 C)   SpO2 93%       BP Readings from Last 3 Encounters:  05/21/19 104/62  04/22/19 136/76  04/16/19 130/62   Wt Readings from Last 3 Encounters:  04/22/19  106 lb (48.1 kg)  02/05/19 106 lb (48.1 kg)  12/31/18 105 lb (47.6 kg)    EXAM:  GENERAL: alert, oriented.  LUNGS: She coughs frequently during conversation.  I speaking in more than a couple of sentences she will start a dry cough.  PSYCH/NEURO: pleasant and cooperative, no obvious depression or anxiety, speech and thought processing grossly intact.  SKIN: able to examine pictures of legs and feet through my chart result.  She has some pitting edema up to ankles.  Heels are erythematous.  Pictures are somewhat blurry but appears to be large flakes of scale posterior heels.  Assessment/Plan 1. COPD  [facility]) She is following with pulmonology.  I have CCed Dr. Melvyn Novas on this chart to see if he would have any additional recommendations prior to her follow-up visit with him in September.  She is using inhalers as directed.  She is not using her flutter valve because it triggers coughing spells.  She did have some improvement in color and consistency  of phlegm after taking antibiotics, but it did not help with her overall coughing amount.  Breathing does feel more comfortable when on oxygen, but she seems to feel need for increasing amounts of oxygen.  - pantoprazole (PROTONIX) 40 MG tablet; Take 1 tablet (40 mg total) by mouth daily. Take 30-60 min before first meal of the day  Dispense: 90 tablet; Refill: 3  2. Supplemental oxygen dependent Currently using 3 L at rest nasal cannula with increase up to 6 L with activity including showering, transfers, toileting.  Maintaining oxygen saturation with nasal cannula oxygen at mentioned levels in the low 90s.  Family was asking if she could potentially get too much oxygen today so we did discuss that with her chronic condition maintaining oxygen levels in the high 80s over 90s at the ideal range.   3. Anxiety Uses alprazolam rarely. - ALPRAZolam (XANAX) 0.25 MG tablet; Take 1 tablet (0.25 mg total) by mouth 2 (two) times daily as needed for anxiety.   Dispense: 30 tablet; Refill: 0  4. Current mild episode of major depressive disorder without prior episode (HCC) Stable. - mirtazapine (REMERON) 15 MG tablet; Take 1 tablet (15 mg total) by mouth at bedtime.  Dispense: 90 tablet; Refill: 3  5. Vitamin B12 deficiency  - cyanocobalamin (,VITAMIN B-12,) 1000 MCG/ML injection; Inject 1ml monthly into muscle  Dispense: 3 mL; Refill: 3  6. Lower extremity edema We discussed that this is likely multifactorial.  She is not able to be very physically active secondary to her current respiratory status.  Additionally, I suspect that protein levels may be optimal.  We discussed elevating legs above heart level whenever possible.  She did try some to see if she can more quickly relieve the swelling.  We are going to try short-term Lasix to see if she gets some relief with this.  7. Pressure injury of heel, stage 1, unspecified laterality Encouraged the family to look into heel pillows to help relieve pressure on the heels with lying down or sitting.  I suspect if pressure can be relieved that she may be able to have improved circulation and healing.  I also sent in topical creams to see if we can help with dryness and irritation of the heels.  Rash does not appear fungal, but due to peeling nature and some scaling on sides of foot, I have added an antifungal in addition to steroid.  We did discuss doing occlusive dressing with steroid and hydrating lotion on top of this to help with skin healing.  Let me know if any worsening in appearance or discomfort.  Return update in 2 weeks via mychart.    I discussed the assessment and treatment plan with the patient. The patient was provided an opportunity to ask questions and all were answered. The patient agreed with the plan and demonstrated an understanding of the instructions.   The patient was advised to call back or seek an in-person evaluation if the symptoms worsen or if the condition fails to improve as  anticipated.  I provided 35 minutes of non-face-to-face time during this encounter.   Theodis ShoveJunell Rayhaan Huster, MD

## 2019-07-21 ENCOUNTER — Other Ambulatory Visit: Payer: Self-pay | Admitting: Internal Medicine

## 2019-07-21 ENCOUNTER — Telehealth: Payer: Self-pay | Admitting: Internal Medicine

## 2019-07-21 DIAGNOSIS — J449 Chronic obstructive pulmonary disease, unspecified: Secondary | ICD-10-CM

## 2019-07-21 MED ORDER — PANTOPRAZOLE SODIUM 40 MG PO TBEC: 40.0000 mg | DELAYED_RELEASE_TABLET | Freq: Every day | ORAL | 1 refills | Status: DC

## 2019-07-21 MED ORDER — PANTOPRAZOLE SODIUM 40 MG PO TBEC: 40.0000 mg | DELAYED_RELEASE_TABLET | Freq: Every day | ORAL | 3 refills | Status: DC

## 2019-07-21 NOTE — Telephone Encounter (Signed)
Checked the Rx for pantoprazole and for some reason it had a print status on there from 7/31 by pt's PCP. For some reason it did not get sent to pharmacy. Called pt's daughter-n-law Nunzio Cory and stated this info to her. Nunzio Cory said that MW was the one who had put her on the pantoprazole. Due to the rx having a print status, it did not get sent to the pharmacy for pt. I have fixed the Rx with a status of normal and sent it to pharmacy for pt. Made Nunzio Cory aware of this while speaking to her and she verbalized understanding. Nothing further needed.

## 2019-07-25 ENCOUNTER — Ambulatory Visit: Payer: Federal, State, Local not specified - PPO | Admitting: Internal Medicine

## 2019-07-25 ENCOUNTER — Telehealth: Payer: Self-pay | Admitting: Internal Medicine

## 2019-07-25 ENCOUNTER — Encounter: Payer: Self-pay | Admitting: Internal Medicine

## 2019-07-25 ENCOUNTER — Other Ambulatory Visit: Payer: Self-pay

## 2019-07-25 DIAGNOSIS — J479 Bronchiectasis, uncomplicated: Secondary | ICD-10-CM | POA: Diagnosis not present

## 2019-07-25 DIAGNOSIS — J9611 Chronic respiratory failure with hypoxia: Secondary | ICD-10-CM

## 2019-07-25 DIAGNOSIS — J9612 Chronic respiratory failure with hypercapnia: Secondary | ICD-10-CM

## 2019-07-25 DIAGNOSIS — J449 Chronic obstructive pulmonary disease, unspecified: Secondary | ICD-10-CM

## 2019-07-25 MED ORDER — PREDNISONE 10 MG PO TABS: ORAL_TABLET | ORAL | 0 refills | Status: DC

## 2019-07-25 NOTE — Telephone Encounter (Signed)
Per CMM, PA has been approved until 07/24/20. Spoke with [employer] with [facility], he was able to run medication through insurance.   Nothing further needed at time of call.

## 2019-07-25 NOTE — Telephone Encounter (Signed)
Spoke with the Nunzio Cory  I received a refill request for pt's pantoprazole on 07/21/19 and this was sent to pharm  Nothing further needed

## 2019-07-25 NOTE — Telephone Encounter (Signed)
Spoke with Nunzio Cory and notified we are working on [place] for pantoprazole  She verbalized understanding

## 2019-07-25 NOTE — Progress Notes (Addendum)
Subjective:   Patient ID: Sharon [name], female    DOB: 07/17/1923     MRN: [id number]   Brief patient profile:  89  yowf quit smoking in 1980 with   minimal chronic doe assoc mild chronic nodular infiltrates dating back to 11/23/2008 with documented GOLD III criteria copd/ bronchiectasis by CT 09/2009 both lower lobes      Admit date: 07/09/2016 Discharge date: 07/12/2016  Discharge Diagnoses:  Principal Problem:   CAP (community acquired pneumonia)   Depression   Community acquired pneumonia   COPD (chronic obstructive pulmonary disease) with emphysema (HCC)   Sepsis (HCC)   Acute respiratory failure with hypoxia (HCC)   Hyponatremia      08/16/2016 1st  Pulmonary office visit/Epic era/ Sharon [name]  Re s/p CAP in pt with bronchiectasis/ GOLD III COPD  Chief Complaint  Patient presents with  . Pulmonary Consult    Referred by Dr. Eleonore Chiquito for pleural effusion. Pt states that she has been dxed with PNA x 2 since July 2017. She has had cough and SOB. Cough is currently non prod.   baseline = MMRC2 = can't walk a nl pace on a flat grade s sob but does fine slow and flat eg [employer] shopping/ uses [employer] parking on symbicort 160 2bid and rare need for combivent  Acutely ill with hoarseness in [place] p getting off plan from [place] then dry cough and admit: Now on 02 at 3lpm 24/7 rec Please see patient coordinator before you leave today  to schedule venous doppler asap on Left> neg  Left  thoracentesis 08/17/16 =  1 liter on Left  WBC 1,020  L > P  Transudate with nl glucose/ cyt neg       12/24/2017  Extended acute  ov/Sharon [name] re: re-establish   COPD GOLD III with bronchiectasis  And prior tranusdative efffuson  Chief Complaint  Patient presents with  . Acute Visit    SOB with exertion, non productive cough more in the morning and with talking,nasal congestion,left foot swelling  solid food / pill dysphagia Sleeps left side down horizontally  Worse doe x 07/2017 rx   dulera 200 2bid helped some but still needing combivent maybe once or whice a day but not noct  Doe now = MMRC3 = can't walk 100 yards even at a slow pace at a flat grade s stopping due to sob  ? On approp 02?  rec Work on inhaler technique:  relax and gently blow all the way out then take a nice smooth deep breath back in, triggering the inhaler at same time you start breathing in.  Hold for up to 5 seconds if you can. Blow out thru nose. Rinse and gargle with water when done. Prednisone 10 mg take  4 each am x 2 days,   2 each am x 2 days,  1 each am x 2 days and stop  Pantoprazole (protonix) 40 mg   Take  30-60 min before first meal of the day and Pepcid (famotidine)  20 mg one @  bedtime until return to office - this is the best way to tell whether stomach acid is contributing to your problem.   GERD   We will have [facility] do a best fit evaluation for your 02 needs        09/18/2018  f/u ov/Sharon [name] re:  GOLD III/ bronchiectasis, no longer able to use devices  Chief Complaint  Patient presents with  . Follow-up    Pt  states she tires easily. She has not been walking much at all for the past month. She has had prod cough with green sputum for several months.   Dyspnea:  Across the room on up to 4lpm Cough: worse x several months, on doxy for skin infection Sleeping: bed flat, on back 2 pillows SABA use: neb every few days 02: 2lpm at rest   rec Plan A = Automatic = Brovana  And Yupelri each am  And in evening @ 12 hours later  just the Brovana   Plan B = Backup only use your albuterol nebulizer if you first try Plan B and it fails to help > ok to use the nebulizer up to every 4 hours but if start needing it regularly call for immediate appointment Finish your antibotics  For cough > max dose of mucinex or mucinex is 1200 mg every 12 hours Please see patient coordinator before you leave today  to schedule home pulmonary rehab per [facility]       10/04/18  NP  Sharon JamesFinish Yupelri and  RickettsBrovana , once done resume.  Spiriva 2 puffs daily  And Dulera 2 puffs Twice daily  (Rinse after use)  Follow med calendar closely and bring to each visit.  Continue on Oxygen 2l/m     04/22/2019 acute extended ov/Sharon [name] re:  aecopd maint on dulera200/ spiriva  Already on doxy 4/29 Chief Complaint  Patient presents with  . Follow-up    Increased cough x 2 wks- on doxy per PCP. Her cough is prod cough with foamy white to green sputum. She has been using her neb 2 x daily on average.   doxy 04/16/19 last dose 04/22/2019 less green but still def discolored esp in  am  Sleeping at 45 degrees ok  02 still 3lpm 24/7  rec If green mucus persists or if condition worsens > levaquin 500 mg one daily x 7 days Prednisone 10 mg take  4 each am x 2 days,   2 each am x 2 days,  1 each am x 2 days and stop  Please remember to go to the  x-ray department  for your tests - we will call you with the results when they are available   Whenever cough flares > guaifenesin= mucinex  1200  And  dm = dextromethorphan  60 mg =>   maximum dose  every 12 hours and use the flutter as much as possible     05/21/2019  f/u ov/Sharon [name] re:  Copd III with bronhciectasis - f/u ? pna on last ov  Chief Complaint  Patient presents with  . Follow-up    Patient states still having productive cough with yellow sputum. Has not had to use rescue nebulizer in 2 weeks.    Dyspnea:  One room to next is all she can do even on  3lpm 24/7 / not checking sats Cough: minimal / not using flutter much at all now  Sleeping: 45 degrees electric bed  SABA use: none x 2 weeks 02: 3lpm 24/7  rec Adjust the 02 to keep 02 sats over 90% otherwise leave at 3lpm   07/25/2019   ov/Sharon [name] re: copd GOLD IIII/bronchiectasis  Chief Complaint  Patient presents with  . Follow-up    Patient reports that she still has sob and cough.   Dyspnea:  Room to room is about the same but requires more 02  Cough: pale /not bloody no am flare but freq daytime struggles to  get mucus up  and was not using fllutter valve correctly nor using contingency correctly  Sleeping: 45 degree electric bed  SABA use: hardly at all  02:  3-4 lpm hs    No obvious day to day or daytime variability or assoc excess/ purulent sputum or mucus plugs or hemoptysis or cp or chest tightness, subjective wheeze or overt sinus or hb symptoms.   Sleeping ok as above without nocturnal  or early am exacerbation  of respiratory  c/o's or need for noct saba. Also denies any obvious fluctuation of symptoms with weather or environmental changes or other aggravating or alleviating factors except as outlined above   No unusual exposure hx or h/o childhood pna/ asthma or knowledge of premature birth.  Current Allergies, Complete Past Medical History, Past Surgical History, Family History, and Social History were reviewed in [employer] record.  ROS  The following are not active complaints unless bolded Hoarseness, sore throat, dysphagia, dental problems, itching, sneezing,  nasal congestion or discharge of excess mucus or purulent secretions, ear ache,   fever, chills, sweats, unintended wt loss or wt gain, classically pleuritic or exertional cp,  orthopnea pnd or arm/hand swelling  or leg swelling, presyncope, palpitations, abdominal pain, anorexia, nausea, vomiting, diarrhea  or change in bowel habits or change in bladder habits, change in stools or change in urine, dysuria, hematuria,  rash, arthralgias, visual complaints, headache, numbness, weakness or ataxia or problems with walking or coordination= uses walker, did not bring,  change in mood or  memory.        Current Meds  Medication Sig  . Acetaminophen (TYLENOL) 325 MG CAPS Take 1 capsule by mouth every 6 (six) hours as needed.  Marland Kitchen albuterol (PROVENTIL) (2.5 MG/3ML) 0.083% nebulizer solution Take 3 mLs (2.5 mg total) by nebulization every 4 (four) hours as needed for wheezing or shortness of breath.  . ALPRAZolam  (XANAX) 0.25 MG tablet Take 1 tablet (0.25 mg total) by mouth 2 (two) times daily as needed for anxiety.  . Calcium-Vitamin D-Vitamin K (VIACTIV) 696-295-28 MG-UNT-MCG CHEW Chew by mouth daily.  . Cholecalciferol 50000 units TABS Take 5,000 Units by mouth.  . clotrimazole (LOTRIMIN) 1 % cream Apply 1 application topically 2 (two) times daily.  . cyanocobalamin (,VITAMIN B-12,) 1000 MCG/ML injection Inject 62ml monthly into muscle  . dextromethorphan-guaiFENesin (MUCINEX DM) 30-600 MG 12hr tablet Take 1 tablet by mouth 2 (two) times daily as needed (cough/congestion).  Marland Kitchen escitalopram (LEXAPRO) 5 MG tablet Take 1 tablet (5 mg total) by mouth daily.  . famotidine (PEPCID) 20 MG tablet One at bedtime  . furosemide (LASIX) 20 MG tablet TAKE 1 TABLET(20 MG) BY MOUTH DAILY AS NEEDED FOR SWELLING  . mirtazapine (REMERON) 15 MG tablet Take 1 tablet (15 mg total) by mouth at bedtime.  . mometasone-formoterol (DULERA) 200-5 MCG/ACT AERO Take 2 puffs first thing in am and then another 2 puffs about 12 hours later.  . OXYGEN Oxygen 3lpm 24/7  . pantoprazole (PROTONIX) 40 MG tablet Take 1 tablet (40 mg total) by mouth daily. Take 30-60 min before first meal of the day  . Polyethyl Glycol-Propyl Glycol (SYSTANE OP) Place 1 drop into both eyes daily.   Marland Kitchen senna-docusate (SENOKOT-S) 8.6-50 MG tablet Take 1 tablet by mouth 2 (two) times daily as needed for mild constipation.  . Tiotropium Bromide Monohydrate (SPIRIVA RESPIMAT) 2.5 MCG/ACT AERS Inhale 2 puffs into the lungs daily.  Marland Kitchen triamcinolone ointment (KENALOG) 0.1 % Apply 1 application topically 2 (two) times daily.  Past Medical History: Osteoporosis B12 deficiency Osteoarthritis COPD    - PFT's December 03, 2009 FEV1 67 (40%) with 12% response to B2 and DLC0 55%    - HFA 25% June 29, 2010 > 75% August 11, 2010 > 75% November 03, 2010  Fx Right Hip and required ORIF June 19th 2011  history of esophageal  stricture...........................................Marland Kitchen.[place] Multiple pulmonary nodules..................................................Marland Kitchen.[facility]    -CT chest 10/13/09 assoc with bilateral lower lobe bronchiectasis              Objective:  Physical Exam    07/25/2019         110  05/21/2019         Did not weigh        04/22/2019         106  02/05/2019       106  12/31/2018       105  11/04/2018      105  09/18/2018        98  04/22/2018           96  12/24/2017           96   08/16/16 102 lb (46.3 kg)  08/15/16 101 lb 4 oz (45.9 kg)  07/28/16 99 lb 12.8 oz (45.3 kg)        HEENT: nl dentition / oropharynx. Nl external ear canals without cough reflex -  Mild bilateral non-specific turbinate edema     NECK :  without JVD/Nodes/TM/ nl carotid upstrokes bilaterally   LUNGS: no acc muscle use,  Mild barrel  contour chest wall with bilateral  Distant bs s audible wheeze and  without cough on insp or exp maneuver and mild  Hyperresonant  to  percussion bilaterally     CV:  RRR  no s3 or murmur or increase in P2, and no edema   ABD:  soft and nontender with pos end  insp Hoover's  in the supine position. No bruits or organomegaly appreciated, bowel sounds nl  MS:    ext warm without deformities, calf tenderness, cyanosis or clubbing No obvious joint restrictions   SKIN: warm and dry without lesions    NEURO:  alert, approp, nl sensorium with  no motor or cerebellar deficits apparent.             Assessment & Plan:

## 2019-07-25 NOTE — Telephone Encounter (Signed)
PT DAUGHTER CALLING A/B A PA FOR PT MED THEY CAN BE REACHED @ [phone].Hillery Hunter

## 2019-07-25 NOTE — Telephone Encounter (Signed)
Medication name and strength: pantoprazole 40mg  Provider: MW Pharmacy: [facility]  Was the PA started on CMM?  Yes If yes, please enter the Key: [id number] Timeframe for approval/denial: up to 24 hours

## 2019-07-25 NOTE — Patient Instructions (Addendum)
No change in medications at this point - use the flutter and nebulizer more often if having trouble with cough/congestion/short of breath  See calendar for specific medication instructions and bring it back for each and every office visit for every healthcare provider you see.  Without it,  you may not receive the best quality medical care that we feel you deserve.  You will note that the calendar groups together  your maintenance  medications that are timed at particular times of the day.  Think of this as your checklist for what your doctor has instructed you to do until your next evaluation to see what benefit  there is  to staying on a consistent group of medications intended to keep you well.  The other group at the bottom is entirely up to you to use as you see fit  for specific symptoms that may arise between visits that require you to treat them on an as needed basis.  Think of this as your action plan or "what if" list.   Separating the top medications from the bottom group is fundamental to providing you adequate care going forward.   Prednisone 10 mg take  4 each am x 2 days,   2 each am x 2 days,  1 each am x 2 days and stop    See Tammy NP in 6 weeks  weeks with all your medications, even over the counter meds, separated in two separate bags, the ones you take no matter what vs the ones you stop once you feel better and take only as needed when you feel you need them.   Tammy  will generate for you a new user friendly medication calendar that will put [place] all on the same page re: your medication use.     Without this process, it simply isn't possible to assure that we are providing  your outpatient care  with  the attention to detail we feel you deserve.   If we cannot assure that you're getting that kind of care,  then we cannot manage your problem effectively from this clinic.  Once you have seen Tammy and we are sure that we're all on the same page with your medication use she will  arrange follow up with me.

## 2019-07-27 ENCOUNTER — Encounter: Payer: Self-pay | Admitting: Internal Medicine

## 2019-07-27 NOTE — Assessment & Plan Note (Addendum)
See CT chest 09/2009  - 05/21/2019  rx doxycline 100 mg one twice daily x10 days prn flare  - flutter valve retraining 07/25/2019    Cough is more daytime than noct or early in am typical of a component of gerd and not taking gerd meds consistently so change ppi to Take 30-60 min before first meal of the day , not prn, and encourage use of flutter   Also added 6 days of Prednisone in case of component of Th-2 driven upper or lower airways inflammation (if cough responds short term only to relapse befor return while will on rx for uacs that would point to allergic rhinitis/ asthma or eos bronchitis)

## 2019-07-27 NOTE — Assessment & Plan Note (Addendum)
Quit smoking 1980 -  PFT's December 03, 2009 FEV1 67 (40%) with 12% response to B2 and DLC0 55%  - 07/25/2019  After extensive coaching inhaler device,  effectiveness =    75% (short Ti)     Group D in terms of symptom/risk and laba/lama/ICS  therefore appropriate rx at this point >>>  Continue symb/spiriva / follow action plan at bottom of med calendar

## 2019-07-27 NOTE — Assessment & Plan Note (Addendum)
On 02 2lpm since d/c from [facility] 07/12/16 - ono RA  10/17/16  desat < 88% x 114 min so rec 2lpm hs to continue  -12/24/2017 referred for Best fit for 02 > done 01/02/18 did not qualify for POC  > desat to 84 on setting of 6 pulsed  - [facility]03   09/30/18 = 34  - 01/22/19 walking sats on pulse of up to 6 still desats so did not qualify for 02 saving device  - 02/05/2019   Walked 3lpm cont  =  approx  50 ft - stopped due to  Sob s desats    Advised again to check sats walking to keep above 90% no need to drive higher in setting of chronic hypercarbia      I had an extended discussion with the patient reviewing all relevant studies completed to date and  lasting 15 to 20 minutes of a 25 minute visit    Each maintenance medication was reviewed in detail including most importantly the difference between maintenance and prns and under what circumstances the prns are to be triggered using an action plan format that is not reflected in the computer generated alphabetically organized AVS but trather by a customized med calendar that reflects the AVS meds with confirmed 100% correlation.   I performed device teaching (flutter and smi)  using a teach back technique which also  extended face to face time for this visit (see above)   In addition, Please see AVS for unique instructions that I personally wrote and verbalized to the the pt in detail and then reviewed with pt  by my nurse highlighting any  changes in therapy recommended at today's visit to their plan of care.

## 2019-08-12 ENCOUNTER — Ambulatory Visit (INDEPENDENT_AMBULATORY_CARE_PROVIDER_SITE_OTHER): Payer: Federal, State, Local not specified - PPO

## 2019-08-12 ENCOUNTER — Encounter: Payer: Self-pay | Admitting: Family Medicine

## 2019-08-12 ENCOUNTER — Other Ambulatory Visit: Payer: Self-pay

## 2019-08-12 ENCOUNTER — Ambulatory Visit: Payer: Federal, State, Local not specified - PPO | Admitting: Family Medicine

## 2019-08-12 VITALS — BP 120/78 | HR 81 | Temp 97.8°F | Ht 65.0 in

## 2019-08-12 DIAGNOSIS — M810 Age-related osteoporosis without current pathological fracture: Secondary | ICD-10-CM

## 2019-08-12 DIAGNOSIS — R0781 Pleurodynia: Secondary | ICD-10-CM

## 2019-08-12 DIAGNOSIS — Z23 Encounter for immunization: Secondary | ICD-10-CM | POA: Diagnosis not present

## 2019-08-12 MED ORDER — TRAMADOL HCL 50 MG PO TABS: 50.0000 mg | ORAL_TABLET | Freq: Four times a day (QID) | ORAL | 0 refills | Status: DC | PRN

## 2019-08-12 MED ORDER — DICLOFENAC SODIUM 1 % TD GEL: 2.0000 g | Freq: Four times a day (QID) | TRANSDERMAL | 0 refills | Status: DC

## 2019-08-12 NOTE — Patient Instructions (Signed)
Try to keep taking deep breaths several times daily  Follow up for any fever or worsening of pain.

## 2019-08-12 NOTE — Progress Notes (Signed)
Subjective:     Patient ID: Sharon [name], female   DOB: [birth year], 85 y.o.   MRN: [id number]  HPI Patient is seen as a work in with 1 day history of severe left-sided pain.  She states she was reaching yesterday and felt sharp pain left side has been achy since then.  Radiates somewhat from the posterior rib area somewhat anterior.  She has multiple chronic problems including history of COPD, bronchiectasis, esophageal stricture, osteoporosis, is on chronic O2.  She denies any abdominal pain.  She states she has somewhat of a chronic cough.  Is not aware of any fever.  She took an Aleve last night with some relief. Pain is moderate to severe at times.  No abdominal pain.    Past Medical History:  Diagnosis Date  . Anemia   . B12 DEFICIENCY 03/31/2008  . COPD 11/27/2008  . ESOPHAGEAL STRICTURE 01/29/2009  . MASS, LUNG 10/13/2009  . NASAL FRACTURE 08/12/2009  . OSTEOARTHRITIS 03/31/2008   Hips  . OSTEOPOROSIS 12/27/2007  . PEDAL EDEMA 12/27/2007  . Pneumonia 07/10/2016  . PULMONARY NODULE 10/21/2009  . RESPIRATORY FAILURE, CHRONIC 06/29/2010  . Shortness of breath dyspnea    Past Surgical History:  Procedure Laterality Date  . APPENDECTOMY     '38  . BALLOON DILATION N/A 05/10/2015   Procedure: BALLOON DILATION;  Surgeon: Inda Castle, MD;  Location: [facility];  Service: Endoscopy;  Laterality: N/A;  . BALLOON DILATION N/A 08/02/2015   Procedure: BALLOON DILATION;  Surgeon: Inda Castle, MD;  Location: [facility];  Service: Endoscopy;  Laterality: N/A;  . BREAST SURGERY     biopsy '47  . CATARACT EXTRACTION Bilateral    last '05  . CHOLECYSTECTOMY     laparoscopic'10  . ESOPHAGOGASTRODUODENOSCOPY     with dilatation x3  . ESOPHAGOGASTRODUODENOSCOPY (EGD) WITH PROPOFOL N/A 05/10/2015   Procedure: ESOPHAGOGASTRODUODENOSCOPY (EGD) WITH PROPOFOL;  Surgeon: Inda Castle, MD;  Location: [facility];  Service: Endoscopy;  Laterality: N/A;  balloon dilation  .  ESOPHAGOGASTRODUODENOSCOPY (EGD) WITH PROPOFOL N/A 08/02/2015   Procedure: ESOPHAGOGASTRODUODENOSCOPY (EGD) WITH PROPOFOL;  Surgeon: Inda Castle, MD;  Location: [facility];  Service: Endoscopy;  Laterality: N/A;  . LUMBAR LAMINECTOMY    . NASAL RECONSTRUCTION WITH SEPTAL REPAIR     '10  . RECTAL PROLAPSE REPAIR     '06  . skin graf     '98    reports that she quit smoking about 40 years ago. She has a 17.50 pack-year smoking history. She has never used smokeless tobacco. She reports current alcohol use. She reports that she does not use drugs. family history includes Abdominal Wall Hernia in her mother; Asthma in her mother; Hypertension in her son; Stomach cancer in her mother. Allergies  Allergen Reactions  . Penicillins Rash    Has patient had a PCN reaction causing immediate rash, facial/tongue/throat swelling, SOB or lightheadedness with hypotension: Yes Has patient had a PCN reaction causing severe rash involving mucus membranes or skin necrosis: Unknown Has patient had a PCN reaction that required hospitalization: Unknown Has patient had a PCN reaction occurring within the last 10 years: No If all of the above answers are "NO", then may proceed with Cephalosporin use.      Review of Systems  Constitutional: Positive for fatigue. Negative for chills and fever.  Respiratory: Positive for cough. Negative for wheezing.   Cardiovascular: Negative for palpitations and leg swelling.  Gastrointestinal: Negative for abdominal pain.  Genitourinary: Negative  for dysuria.  Neurological: Negative for dizziness.  Psychiatric/Behavioral: Negative for confusion.       Objective:   Physical Exam Constitutional:      Comments: Thin somewhat frail-appearing 85 year old female in no acute distress at rest  Cardiovascular:     Rate and Rhythm: Normal rate.  Pulmonary:     Comments: She has coarse breath sounds in both bases.  No retractions Musculoskeletal:     Right lower leg: No  edema.     Left lower leg: No edema.     Comments: Very tender to palpation left posterior rib cage area  Neurological:     Mental Status: She is alert.        Assessment:     Left posterior rib/chest wall pain.  Suspect probably musculoskeletal.  High risk for fracture with her osteoporosis We explained also that subtle rib fractures can be very difficult to ascertain by plain x-ray    Plan:     -Obtain chest and left unilateral rib film-to be over read by radiology -Wrote for limited tramadol 1 every 6 hours as needed for severe pain if not relieved with over-the-counter Tylenol.  Avoid regular use of non-steroidals -She is encouraged to use incentive spirometer frequently -Follow-up for any worsening pain, fever, or other concerns -Patient had question regarding some type of binder and we have strongly advised against that with her chronic lung disease  Kristian CoveyBruce W Jameel Quant MD [facility] [place]

## 2019-08-18 ENCOUNTER — Ambulatory Visit: Payer: Self-pay

## 2019-08-18 ENCOUNTER — Other Ambulatory Visit: Payer: Self-pay | Admitting: Family Medicine

## 2019-08-18 ENCOUNTER — Telehealth: Payer: Self-pay | Admitting: Internal Medicine

## 2019-08-18 ENCOUNTER — Encounter: Payer: Self-pay | Admitting: Family Medicine

## 2019-08-18 DIAGNOSIS — R0781 Pleurodynia: Secondary | ICD-10-CM

## 2019-08-18 MED ORDER — LEVOFLOXACIN 500 MG PO TABS: 500.0000 mg | ORAL_TABLET | Freq: Every day | ORAL | 0 refills | Status: DC

## 2019-08-18 MED ORDER — TRAMADOL HCL 50 MG PO TABS: 50.0000 mg | ORAL_TABLET | Freq: Three times a day (TID) | ORAL | 0 refills | Status: AC | PRN

## 2019-08-18 MED ORDER — LEVOFLOXACIN 25 MG/ML PO SOLN: 500.0000 mg | Freq: Every day | ORAL | 0 refills | Status: DC

## 2019-08-18 NOTE — Telephone Encounter (Signed)
Call returned to patient, spoke with son Catalina Antigua (dpr), made aware of MW recommendations. He states he would like to go ahead and put her on the antibiotic because they forgot to mention that she is wheezing on inhalation and she has had a loss of appetite. He reports her mucous productions has increased over the last couple days in amount and intensity so he thinks the antibiotic will do her some good.  Confirmed pharmacy. Medication sent in. Nothing further needed at this time.

## 2019-08-18 NOTE — Telephone Encounter (Signed)
That's fine should rx approx same dose

## 2019-08-18 NOTE — Telephone Encounter (Signed)
I did send in refill for tramadol for her. Please let me know how much this helps?   Is pain worsening/same/improving?  It is VERY important that she keep making sure to take deep breaths through day to prevent from getting pneumonia. If pain is not improving we may need follow up or additional eval.

## 2019-08-18 NOTE — Telephone Encounter (Signed)
Spoke with the pts son and informed him of the message below.  Stated pt stated the pain is about the same and he spoke with the pulmonologist today, denied pneumonia on the last x-ray and he gave her a 7-day course of an antibiotic. Message sent to Dr Ethlyn Gallery.

## 2019-08-18 NOTE — Telephone Encounter (Signed)
MW please advise if you're ok with switching pt's levaquin rx to a liquid dose.  Per daughter-in-law, pt has difficulty swallowing large pills.  Thanks!

## 2019-08-18 NOTE — Telephone Encounter (Signed)
rx liquid dosage sent to pharmacy, same dosage as previously prescribed.  Pt's son and daughter-in-law are aware.  Nothing further needed at this time- will close encounter.

## 2019-08-18 NOTE — Telephone Encounter (Signed)
Call returned to Nunzio Cory (dpr), she reports the patient was seen by her PCP last week and they did a CXR. She reports she is having pain on her left side. She reports the patient reports she felt like something snapped in her back. She reports it as a sharp pain shooting from the front to back on left side. She has a cough with dark green and brown mucous. She is wanting MW to review the CXR and give recommendations. She is been given tramadol for the pain. She reports she is using mucinex daily. She is also requesting to know whether or not the refills of the tramadol need to come from her PCP who prescribed the meds or MW. I explained typically the prescribing physcian should be the one refilling to medication. Voiced understanding.   MW please advise. CXR is in epic and any further recommendations. Thanks.

## 2019-08-18 NOTE — Telephone Encounter (Signed)
See note

## 2019-08-18 NOTE — Telephone Encounter (Signed)
No evidence of pna but probably has a musculoskeletal source  If mucus has turned more purulent though needs abx and since can't take pcn rec levaquin 500 mg x 7days

## 2019-08-18 NOTE — Telephone Encounter (Signed)
Will await for Dr. Ethlyn Gallery response

## 2019-08-18 NOTE — Telephone Encounter (Signed)
Noted. I have sent them message as well.

## 2019-08-18 NOTE — Telephone Encounter (Signed)
Patients son called stating that his mother has has pain to her left side that goes around to her back.  She denies chest pain.  She was seen last week and it was recommended that she see her pulmonologist. The pulmonary doctor told her to return to her PCP. Her pain is worse at night when trying to get OOB to the bathroom.  She denies nausea, rash,sweats. She states that the pain does not interfere with her breathing. Care advice was read to son and Ms Marrone. They verbalized understanding.  They are requesting an RX for Tramadol. Call was placed to office. Note will be route as per office instruction for Dr Ethlyn Gallery to review and advise patient.   Reason for Disposition . [1] Chest pain lasting < 5 minutes AND [2] NO chest pain or cardiac symptoms (e.g., breathing difficulty, sweating) now  Answer Assessment - Initial Assessment Questions 1. LOCATION: "Where does it hurt?"       Left side back 2. RADIATION: "Does the pain go anywhere else?" (e.g., into neck, jaw, arms, back)    back 3. ONSET: "When did the chest pain begin?" (Minutes, hours or days)     Last week 4. PATTERN "Does the pain come and go, or has it been constant since it started?"  "Does it get worse with exertion?"      Worse when movement 5. DURATION: "How long does it last" (e.g., seconds, minutes, hours)     hours 6. SEVERITY: "How bad is the pain?"  (e.g., Scale 1-10; mild, moderate, or severe)    - MILD (1-3): doesn't interfere with normal activities     - MODERATE (4-7): interferes with normal activities or awakens from sleep    - SEVERE (8-10): excruciating pain, unable to do any normal activities       4-7 7. CARDIAC RISK FACTORS: "Do you have any history of heart problems or risk factors for heart disease?" (e.g., angina, prior heart attack; diabetes, high blood pressure, high cholesterol, smoker, or strong family history of heart disease)     no 8. PULMONARY RISK FACTORS: "Do you have any history of lung disease?"   (e.g., blood clots in lung, asthma, emphysema, birth control pills)     broncal stasis 9. CAUSE: "What do you think is causing the chest pain?"     Unsure 10. OTHER SYMPTOMS: "Do you have any other symptoms?" (e.g., dizziness, nausea, vomiting, sweating, fever, difficulty breathing, cough)       No Cough is persistant 11. PREGNANCY: "Is there any chance you are pregnant?" "When was your last menstrual period?"      N/A  Protocols used: CHEST PAIN-A-AH

## 2019-08-22 ENCOUNTER — Ambulatory Visit: Payer: Federal, State, Local not specified - PPO | Admitting: Internal Medicine

## 2019-08-26 ENCOUNTER — Emergency Department (HOSPITAL_COMMUNITY)
Admission: EM | Admit: 2019-08-26 | Discharge: 2019-08-27 | Disposition: A | Discharge status: Home / Self Care | Payer: Federal, State, Local not specified - PPO | Attending: Emergency Medicine | Admitting: Emergency Medicine

## 2019-08-26 ENCOUNTER — Emergency Department (HOSPITAL_COMMUNITY): Payer: Federal, State, Local not specified - PPO

## 2019-08-26 ENCOUNTER — Ambulatory Visit: Payer: Self-pay | Admitting: *Deleted

## 2019-08-26 ENCOUNTER — Other Ambulatory Visit: Payer: Self-pay

## 2019-08-26 ENCOUNTER — Emergency Department (HOSPITAL_BASED_OUTPATIENT_CLINIC_OR_DEPARTMENT_OTHER): Payer: Federal, State, Local not specified - PPO

## 2019-08-26 DIAGNOSIS — J189 Pneumonia, unspecified organism: Secondary | ICD-10-CM | POA: Insufficient documentation

## 2019-08-26 DIAGNOSIS — K5900 Constipation, unspecified: Secondary | ICD-10-CM | POA: Insufficient documentation

## 2019-08-26 DIAGNOSIS — Z87891 Personal history of nicotine dependence: Secondary | ICD-10-CM | POA: Insufficient documentation

## 2019-08-26 DIAGNOSIS — M549 Dorsalgia, unspecified: Secondary | ICD-10-CM | POA: Diagnosis present

## 2019-08-26 DIAGNOSIS — R1084 Generalized abdominal pain: Secondary | ICD-10-CM | POA: Insufficient documentation

## 2019-08-26 DIAGNOSIS — Z79899 Other long term (current) drug therapy: Secondary | ICD-10-CM | POA: Diagnosis not present

## 2019-08-26 DIAGNOSIS — R52 Pain, unspecified: Secondary | ICD-10-CM

## 2019-08-26 DIAGNOSIS — R600 Localized edema: Secondary | ICD-10-CM

## 2019-08-26 LAB — BASIC METABOLIC PANEL
Anion gap: 9 (ref 5–15)
BUN: 10 mg/dL (ref 8–23)
CO2: 29 mmol/L (ref 22–32)
Calcium: 9.6 mg/dL (ref 8.9–10.3)
Chloride: 93 mmol/L — ABNORMAL LOW (ref 98–111)
Creatinine, Ser: 0.6 mg/dL (ref 0.44–1.00)
GFR calc Af Amer: >60 mL/min (ref >60)
GFR calc non Af Amer: >60 mL/min (ref >60)
Glucose, Bld: 114 mg/dL — ABNORMAL HIGH (ref 70–99)
Potassium: 4.1 mmol/L (ref 3.5–5.1)
Sodium: 131 mmol/L — ABNORMAL LOW (ref 135–145)

## 2019-08-26 LAB — URINALYSIS, ROUTINE W REFLEX MICROSCOPIC
Bilirubin Urine: NEGATIVE
Glucose, UA: NEGATIVE mg/dL
Hgb urine dipstick: NEGATIVE
Ketones, ur: 5 mg/dL — AB
Leukocytes,Ua: NEGATIVE
Nitrite: NEGATIVE
Protein, ur: NEGATIVE mg/dL
Specific Gravity, Urine: 1.026 (ref 1.005–1.030)
pH: 6 (ref 5.0–8.0)

## 2019-08-26 LAB — TROPONIN I (HIGH SENSITIVITY)
Troponin I (High Sensitivity): 6 ng/L (ref <18)
Troponin I (High Sensitivity): 6 ng/L (ref <18)

## 2019-08-26 LAB — CBC
HCT: 34.3 % — ABNORMAL LOW (ref 36.0–46.0)
Hemoglobin: 10.7 g/dL — ABNORMAL LOW (ref 12.0–15.0)
MCH: 29.8 pg (ref 26.0–34.0)
MCHC: 31.2 g/dL (ref 30.0–36.0)
MCV: 95.5 fL (ref 80.0–100.0)
Platelets: 455 10*3/uL — ABNORMAL HIGH (ref 150–400)
RBC: 3.59 MIL/uL — ABNORMAL LOW (ref 3.87–5.11)
RDW: 12.6 % (ref 11.5–15.5)
WBC: 12.9 10*3/uL — ABNORMAL HIGH (ref 4.0–10.5)
nRBC: 0 % (ref 0.0–0.2)

## 2019-08-26 MED ORDER — LIDOCAINE 5 % EX PTCH
1.0000 | MEDICATED_PATCH | CUTANEOUS | Status: DC
  Administered 2019-08-26: 1 via TRANSDERMAL
  Filled 2019-08-26: qty 1

## 2019-08-26 MED ORDER — MORPHINE SULFATE (PF) 4 MG/ML IV SOLN
4.0000 mg | Freq: Once | INTRAVENOUS | Status: AC
  Administered 2019-08-26: 4 mg via INTRAVENOUS
  Filled 2019-08-26: qty 1

## 2019-08-26 MED ORDER — LIDOCAINE 5 % EX PTCH: 1.0000 | MEDICATED_PATCH | CUTANEOUS | 0 refills | Status: DC

## 2019-08-26 MED ORDER — AZITHROMYCIN 250 MG PO TABS: 250.0000 mg | ORAL_TABLET | Freq: Every day | ORAL | 0 refills | Status: DC

## 2019-08-26 MED ORDER — SODIUM CHLORIDE 0.9% FLUSH: 3.0000 mL | Freq: Once | INTRAVENOUS | Status: DC

## 2019-08-26 MED ORDER — SODIUM CHLORIDE 0.9 % IV BOLUS
500.0000 mL | Freq: Once | INTRAVENOUS | Status: AC
  Administered 2019-08-26: 22:00:00 500 mL via INTRAVENOUS

## 2019-08-26 MED ORDER — FLEET ENEMA 7-19 GM/118ML RE ENEM
1.0000 | ENEMA | Freq: Once | RECTAL | Status: AC
  Administered 2019-08-26: 1 via RECTAL

## 2019-08-26 NOTE — ED Notes (Signed)
Patient transported to CT 

## 2019-08-26 NOTE — Progress Notes (Signed)
Bilateral lower extremity venous duplex completed. Refer to "CV Proc" under chart review to view preliminary results.  08/26/2019 7:00 PM Maudry Mayhew, MHA, RVT, RDCS, RDMS

## 2019-08-26 NOTE — ED Triage Notes (Signed)
Pt here with multiple complaints first is no bowel movement for 2 weeks. Severe back pain, xray was negative. Has bilateral leg swelling , denies SOB. Today had increased stomach pain and felt hard. Very fatigued and weak per daughter. VSS. NAD at present.

## 2019-08-26 NOTE — ED Notes (Signed)
Vascular tech at bedside. °

## 2019-08-26 NOTE — Telephone Encounter (Signed)
I would say that with all those issues, especially the two weeks without a BM, she should be seen in the ER

## 2019-08-26 NOTE — Telephone Encounter (Signed)
Pt's daughter in law calling, pt present during call. Initially calling regarding issue of constipation, but reports multiple concerns. States LBM "Almost 2 weeks ago." Reports abd "Bloated, hard." Denies any nausea, no vomiting. Decreased appetite. Has been taking senna-s 1 tab daily, recently increased to two daily.  Saw Dr. Elease Hashimoto for back pain 08/12/2019 "Muscle related." States back pain has worsened, Tramadol "Takes the edge off." Pain is mid back, radiates under left lower rib cage area.  Reports both feet and ankles swollen, left > right. Left ankle "Purplish" Reports "Fluid filled bump, 4 inches ling lower back", no redness or warmth, not painful.  States confused at times, increased anxiety. Also reports more difficulty swallowing pills as previously. States "we don't want to take her to hospital."  Requesting appt. Attempted to reach practice x 2; recording. Assured pt and daughter in law [place] would route to practice for Dr. Berenice Bouton review.  Please advise: [phone] Care advise given.  Reason for Disposition . Last bowel movement (BM) > 4 days ago  Answer Assessment - Initial Assessment Questions 1. STOOL PATTERN OR FREQUENCY: "How often do you pass bowel movements (BMs)?"  (Normal range: tid to q 3 days)  "When was the last BM passed?"      "Almost 2 weeks ago" 2. STRAINING: "Do you have to strain to have a BM?"      yes 3. RECTAL PAIN: "Does your rectum hurt when the stool comes out?" If so, ask: "Do you have hemorrhoids? How bad is the pain?"  (Scale 1-10; or mild, moderate, severe)     no 4. STOOL COMPOSITION: "Are the stools hard?"      No BMs 5. BLOOD ON STOOLS: "Has there been any blood on the toilet tissue or on the surface of the BM?" If so, ask: "When was the last time?"     no 6. CHRONIC CONSTIPATION: "Is this a new problem for you?"  If no, ask: "How long have you had this problem?" (days, weeks, months)      no 7. CHANGES IN DIET: "Have there been any  recent changes in your diet?"     no 8. MEDICATIONS: "Have you been taking any new medications?"     Tramadol, antibiotic 9. LAXATIVES: "Have you been using any laxatives or enemas?"  If yes, ask "What, how often, and when was the last time?"     Senna -S 2x daily 10. CAUSE: "What do you think is causing the constipation?"       Unsure 11. OTHER SYMPTOMS: "Do you have any other symptoms?" (e.g., abdominal pain, fever, vomiting)       Abd. "Bloated" hard  Protocols used: CONSTIPATION-A-AH

## 2019-08-26 NOTE — Discharge Instructions (Addendum)
May take 8 scoops of MiraLAX in 32 ounces of liquid of your choice.  May do additional fleets enema at home.  Follow-up with PCP for reevaluation.

## 2019-08-26 NOTE — ED Provider Notes (Signed)
[facility] EMERGENCY DEPARTMENT Provider Note   CSN: [id number] Arrival date & time: 08/26/19  1234   History   Chief Complaint Chief Complaint  Patient presents with   Constipation   Leg Swelling   Back Pain   HPI Sharon [name] is a 85 y.o. female with past medical history significant for COPD, chronic respiratory failure on 3 to 5 L oxygen via nasal cannula, esophageal stricture who presents for evaluation multiple complaints.  Patient presents with daughter in law who provides majority of history.  Patient has not had a bowel movement over 2 weeks.  They have been trying an enema at home without resolve.  States today patient started complaining of abdominal pain and a hard sensation to her abdomen.  She is also had lower back pain.  Was seen by PCP and had negative x-ray.  They were giving her tramadol without relief of her pain.  Patient is also had fatigue and generalized weakness.  She denies headache, unilateral weakness, facial droop, chest pain, shortness of breath, melena, hematochezia, dysuria.  Family states she has had bilateral lower extremity swelling x2 months.  They have not been seen by PCP for this.  Has not taken anything for symptoms. Denies additional aggravating or alleviating factors.  History obtained from patient, family and past medical history. No interpretor was used.     HPI  Past Medical History:  Diagnosis Date   Anemia    B12 DEFICIENCY 03/31/2008   COPD 11/27/2008   ESOPHAGEAL STRICTURE 01/29/2009   MASS, LUNG 10/13/2009   NASAL FRACTURE 08/12/2009   OSTEOARTHRITIS 03/31/2008   Hips   OSTEOPOROSIS 12/27/2007   PEDAL EDEMA 12/27/2007   Pneumonia 07/10/2016   PULMONARY NODULE 10/21/2009   RESPIRATORY FAILURE, CHRONIC 06/29/2010   Shortness of breath dyspnea     Patient Active Problem List   Diagnosis Date Noted   COPD with acute exacerbation (HCC) 04/22/2019   Chronic respiratory failure with hypoxia and  hypercapnia (HCC) 01/01/2019   Bronchiectasis with acute exacerbation (HCC) 12/24/2018   Protein calorie malnutrition (HCC) 05/20/2018   Paroxysmal atrial fibrillation (HCC) 04/03/2018   S/p left hip fracture 04/03/2018   Acute respiratory failure (HCC) 07/20/2017   Supplemental oxygen dependent    Cough 07/19/2017   Pedal edema 05/15/2017   Bronchiectasis without acute exacerbation (HCC) 10/14/2016   Acute respiratory failure with hypoxia (HCC) 07/04/2015   COPD GOLD III  07/02/2015   Normocytic normochromic anemia 07/02/2015   Dysphagia 02/25/2015   Depression 09/25/2013   Chronic respiratory failure with hypoxia (HCC) 06/29/2010   PULMONARY NODULE 10/21/2009   NASAL FRACTURE 08/12/2009   ESOPHAGEAL STRICTURE 01/29/2009   COPD exacerbation (HCC) 11/27/2008   Vitamin B12 deficiency 03/31/2008   Osteoarthritis 03/31/2008   Osteoporosis 12/27/2007    Past Surgical History:  Procedure Laterality Date   APPENDECTOMY     '38   BALLOON DILATION N/A 05/10/2015   Procedure: BALLOON DILATION;  Surgeon: Louis Meckel, MD;  Location: [facility];  Service: Endoscopy;  Laterality: N/A;   BALLOON DILATION N/A 08/02/2015   Procedure: BALLOON DILATION;  Surgeon: Louis Meckel, MD;  Location: [facility];  Service: Endoscopy;  Laterality: N/A;   BREAST SURGERY     biopsy '47   CATARACT EXTRACTION Bilateral    last '05   CHOLECYSTECTOMY     laparoscopic'10   ESOPHAGOGASTRODUODENOSCOPY     with dilatation x3   ESOPHAGOGASTRODUODENOSCOPY (EGD) WITH PROPOFOL N/A 05/10/2015   Procedure: ESOPHAGOGASTRODUODENOSCOPY (EGD) WITH  PROPOFOL;  Surgeon: Louis Meckel, MD;  Location: [facility];  Service: Endoscopy;  Laterality: N/A;  balloon dilation   ESOPHAGOGASTRODUODENOSCOPY (EGD) WITH PROPOFOL N/A 08/02/2015   Procedure: ESOPHAGOGASTRODUODENOSCOPY (EGD) WITH PROPOFOL;  Surgeon: Louis Meckel, MD;  Location: [facility];  Service: Endoscopy;  Laterality: N/A;    LUMBAR LAMINECTOMY     NASAL RECONSTRUCTION WITH SEPTAL REPAIR     '10   RECTAL PROLAPSE REPAIR     '06   skin graf     '98    OB History   No obstetric history on file.    Home Medications    Prior to Admission medications   Medication Sig Start Date End Date Taking? Authorizing Provider  Acetaminophen (TYLENOL) 325 MG CAPS Take 1 capsule by mouth every 6 (six) hours as needed.    [provider]  albuterol (PROVENTIL) (2.5 MG/3ML) 0.083% nebulizer solution Take 3 mLs (2.5 mg total) by nebulization every 4 (four) hours as needed for wheezing or shortness of breath. 12/31/18   Nyoka Cowden, MD  ALPRAZolam Prudy Feeler) 0.25 MG tablet Take 1 tablet (0.25 mg total) by mouth 2 (two) times daily as needed for anxiety. 07/18/19   Koberlein, Paris Lore, MD  azithromycin (ZITHROMAX) 250 MG tablet Take 1 tablet (250 mg total) by mouth daily. Take first 2 tablets together, then 1 every day until finished. 08/26/19   Shaundrea Carrigg A, PA-C  Calcium-Vitamin D-Vitamin K (VIACTIV) 500-500-40 MG-UNT-MCG CHEW Chew by mouth daily.    [provider]  Cholecalciferol 50000 units TABS Take 5,000 Units by mouth.    [provider]  clotrimazole (LOTRIMIN) 1 % cream Apply 1 application topically 2 (two) times daily. 07/18/19   Wynn Banker, MD  cyanocobalamin (,VITAMIN B-12,) 1000 MCG/ML injection Inject 1ml monthly into muscle 07/18/19   Koberlein, Junell C, MD  dextromethorphan-guaiFENesin (MUCINEX DM) 30-600 MG 12hr tablet Take 1 tablet by mouth 2 (two) times daily as needed (cough/congestion).    [provider]  diclofenac sodium (VOLTAREN) 1 % GEL Apply 2 g topically 4 (four) times daily. 08/12/19   Burchette, Elberta Fortis, MD  escitalopram (LEXAPRO) 5 MG tablet Take 1 tablet (5 mg total) by mouth daily. 06/23/19   Wynn Banker, MD  famotidine (PEPCID) 20 MG tablet One at bedtime 12/24/17   Nyoka Cowden, MD  furosemide (LASIX) 20 MG tablet TAKE 1 TABLET(20 MG)  BY MOUTH DAILY AS NEEDED FOR SWELLING 07/22/19   Koberlein, Paris Lore, MD  levofloxacin (LEVAQUIN) 25 MG/ML solution Take 20 mLs (500 mg total) by mouth daily. 08/18/19   Nyoka Cowden, MD  lidocaine (LIDODERM) 5 % Place 1 patch onto the skin daily. Remove & Discard patch within 12 hours or as directed by MD 08/26/19   Parminder Cupples A, PA-C  mirtazapine (REMERON) 15 MG tablet Take 1 tablet (15 mg total) by mouth at bedtime. 07/18/19   Koberlein, Paris Lore, MD  mometasone-formoterol (DULERA) 200-5 MCG/ACT AERO Take 2 puffs first thing in am and then another 2 puffs about 12 hours later. 12/31/18   Nyoka Cowden, MD  OXYGEN Oxygen 3lpm 24/7    [provider]  pantoprazole (PROTONIX) 40 MG tablet Take 1 tablet (40 mg total) by mouth daily. Take 30-60 min before first meal of the day 07/21/19   Nyoka Cowden, MD  Polyethyl Glycol-Propyl Glycol (SYSTANE OP) Place 1 drop into both eyes daily.     [provider]  predniSONE (DELTASONE) 10  MG tablet Take  4 each am x 2 days,   2 each am x 2 days,  1 each am x 2 days and stop Patient not taking: Reported on 08/12/2019 07/25/19   Tanda Rockers, MD  senna-docusate (SENOKOT-S) 8.6-50 MG tablet Take 1 tablet by mouth 2 (two) times daily as needed for mild constipation. 09/30/18   Caren Macadam, MD  Tiotropium Bromide Monohydrate (SPIRIVA RESPIMAT) 2.5 MCG/ACT AERS Inhale 2 puffs into the lungs daily. 07/05/19   Tanda Rockers, MD  triamcinolone ointment (KENALOG) 0.1 % Apply 1 application topically 2 (two) times daily. 07/18/19   Caren Macadam, MD    Family History Family History  Problem Relation Age of Onset   Hypertension Son    Asthma Mother    Abdominal Wall Hernia Mother    Stomach cancer Mother     Social History Social History   Tobacco Use   Smoking status: Former Smoker    Packs/day: 0.50    Years: 35.00    Pack years: 17.50    Quit date: 12/18/1978    Years since quitting: 40.7   Smokeless tobacco:  Never Used  Substance Use Topics   Alcohol use: Yes    Alcohol/week: 0.0 standard drinks    Comment: couple of gin and tonics daily   Drug use: No     Allergies   Penicillins   Review of Systems Review of Systems  Constitutional: Positive for activity change, appetite change and fatigue.  HENT: Negative.   Respiratory: Negative.   Cardiovascular: Negative.   Gastrointestinal: Positive for abdominal pain and constipation. Negative for abdominal distention, anal bleeding, blood in stool, diarrhea, nausea, rectal pain and vomiting.  Genitourinary: Negative.   Musculoskeletal: Negative.   Skin: Negative.   Neurological: Positive for weakness (Generalized). Negative for dizziness, tremors, seizures, syncope, speech difficulty, light-headedness, numbness and headaches.  All other systems reviewed and are negative.    Physical Exam Updated Vital Signs BP (!) 186/78 (BP Location: Right Arm)    Pulse 78    Temp 98.4 F (36.9 C) (Oral)    Resp 19    Ht 5\' 6"  (1.676 m)    Wt 49.9 kg    SpO2 99%    BMI 17.75 kg/m   Physical Exam Vitals signs and nursing note reviewed.  Constitutional:      General: She is not in acute distress.    Appearance: She is well-developed. She is not toxic-appearing.     Comments: Frail, chronically ill appearing. On home oxygen at 3L [place]  HENT:     Head: Normocephalic and atraumatic.     Nose: Nose normal.     Mouth/Throat:     Mouth: Mucous membranes are dry.  Eyes:     Pupils: Pupils are equal, round, and reactive to light.  Neck:     Musculoskeletal: Normal range of motion.     Comments: Moves neck freely without stiffness or rigidity. Cardiovascular:     Rate and Rhythm: Normal rate.     Pulses: Normal pulses.          Radial pulses are 2+ on the right side and 2+ on the left side.       Dorsalis pedis pulses are 2+ on the right side and 2+ on the left side.       Posterior tibial pulses are 2+ on the right side and 2+ on the left side.      Heart sounds: Normal heart sounds.  Pulmonary:     Effort: No respiratory distress.     Comments: Coarse lung sounds in bilateral bases.  No respiratory distress.  Speaks in full sentences without difficulty. Abdominal:     General: There is no distension.     Comments: Soft, generalized tenderness palpation.  No rebound or guarding.  No CVA tenderness bilaterally.  No overlying skin changes to abdominal wall.  No masses.  Genitourinary:    Comments: Small hard little stool balls in rectum. No impaction. Musculoskeletal: Normal range of motion.     Comments: 1+  bilateral lower extremity pitting edema to distal tib-fib.  Calves nontender bilaterally.  No evidence of DVT.  Feet:     Comments: 2+ DP, PT pulses bilaterally.  No pallor.  Mild erythema to bilateral lower extremities without induration, fluctuance, warmth, streaking. Skin:    General: Skin is warm and dry.     Capillary Refill: Capillary refill takes less than 2 seconds.  Neurological:     Mental Status: She is alert.     Comments: Cranial nerves II through XII grossly intact.  No facial droop.    ED Treatments / Results  Labs (all labs ordered are listed, but only abnormal results are displayed) Labs Reviewed  BASIC METABOLIC PANEL - Abnormal; Notable for the following components:      Result Value   Sodium 131 (*)    Chloride 93 (*)    Glucose, Bld 114 (*)    All other components within normal limits  CBC - Abnormal; Notable for the following components:   WBC 12.9 (*)    RBC 3.59 (*)    Hemoglobin 10.7 (*)    HCT 34.3 (*)    Platelets 455 (*)    All other components within normal limits  URINALYSIS, ROUTINE W REFLEX MICROSCOPIC - Abnormal; Notable for the following components:   Ketones, ur 5 (*)    All other components within normal limits  URINE CULTURE  TROPONIN I (HIGH SENSITIVITY)  TROPONIN I (HIGH SENSITIVITY)    EKG None  Radiology Ct Abdomen Pelvis Wo Contrast  Result Date: 08/26/2019 CLINICAL  DATA:  85 year old female with constipation. No bowel movements for 2 weeks. EXAM: CT ABDOMEN AND PELVIS WITHOUT CONTRAST TECHNIQUE: Multidetector CT imaging of the abdomen and pelvis was performed following the standard protocol without IV contrast. COMPARISON:  None. FINDINGS: Evaluation of this exam is limited in the absence of intravenous contrast. Lower chest: Bibasilar subpleural thickening and scarring. Left lung base airspace density may represent atelectasis/scarring. Pneumonia is not entirely excluded. Clinical correlation is recommended. There is background of emphysema and moderate bronchiectasis. Trace bilateral pleural effusions noted. There is coronary vascular calcification. There is focal area defect in the right hemidiaphragm with herniation of abdominal fat into the right lung base. No intra-abdominal free air or free fluid. Hepatobiliary: Subcentimeter hypodense focus in the dome of the liver is too small to characterize. The liver is otherwise unremarkable. No intrahepatic biliary ductal dilatation. Cholecystectomy. Pancreas: Unremarkable. No pancreatic ductal dilatation or surrounding inflammatory changes. Spleen: Normal in size without focal abnormality. Adrenals/Urinary Tract: The adrenal glands are grossly unremarkable. There is no hydronephrosis on either side. Excreted contrast noted in the renal collecting systems bilaterally, likely related to prior contrast administration. The visualized ureters and urinary bladder appear unremarkable. Contrast is also noted within the urinary bladder. Stomach/Bowel: Moderate amount of stool mixed with oral contrast is noted throughout the colon. There is sigmoid diverticulosis with muscular hypertrophy. No active inflammatory changes.  There is diffuse mild thickened appearance and narrowing of the rectum, likely related to underdistention. There is no bowel obstruction. The appendix is not visualized with certainty. No inflammatory changes identified  in the right lower quadrant. Vascular/Lymphatic: Advanced aortoiliac atherosclerotic disease. The abdominal aorta and IVC are otherwise grossly unremarkable on this noncontrast CT. No portal venous gas. There is no adenopathy. Reproductive: Small calcified uterine fibroid. No adnexal masses. Other: None Musculoskeletal: Osteopenia with extensive degenerative changes of the spine. No definite acute fracture. L2-S1 posterior fusion noted. There is lower lumbar laminectomy. Old healed bilateral femoral neck fractures status post ORIF. IMPRESSION: 1. Constipation.  No bowel obstruction or active inflammation. 2. Sigmoid diverticulosis without active inflammatory changes. 3. Aortic Atherosclerosis (ICD10-I70.0). Electronically Signed   By: Elgie CollardArash  Radparvar M.D.   On: 08/26/2019 21:49   Dg Chest 2 View  Result Date: 08/26/2019 CLINICAL DATA:  Cough, chest pain EXAM: CHEST - 2 VIEW COMPARISON:  08/12/2019 FINDINGS: The heart size and mediastinal contours are stable. Small bilateral pleural effusions with associated bibasilar opacities. Chronic coarsened interstitial markings bilaterally. No pneumothorax. Osseous structures are demineralized. Chronic wedging of a mid to upper thoracic vertebral segment. IMPRESSION: Small bilateral pleural effusions with associated bibasilar opacities which may reflect atelectasis versus developing infection in the appropriate clinical setting. Electronically Signed   By: Duanne GuessNicholas  Plundo M.D.   On: 08/26/2019 13:40   Ct L-spine No Charge  Result Date: 08/26/2019 CLINICAL DATA:  85 year old female with no bowel movement and severe back pain. EXAM: CT LUMBAR SPINE WITHOUT CONTRAST TECHNIQUE: Multidetector CT imaging of the lumbar spine was performed without intravenous contrast administration. Multiplanar CT image reconstructions were also generated. COMPARISON:  CT of the abdomen pelvis dated 07/27/2019. FINDINGS: Segmentation: 5 lumbar type vertebrae. Alignment: No acute subluxation.  Grade 1 L4-L5 anterolisthesis. Vertebrae: Severely osteopenic which limits evaluation for fracture. No definite acute fracture identified. L2-S1 posterior fusion and L3-L5 laminectomy. Paraspinal and other soft tissues: No fluid collection or hematoma. Postsurgical changes of L3-L5 laminectomy Disc levels: Extensive multilevel degenerative changes. Multilevel facet hypertrophy and fusion. IMPRESSION: 1. No acute/traumatic lumbar spine pathology. 2. Advanced osteopenia, degenerative changes and fusion. Electronically Signed   By: Elgie CollardArash  Radparvar M.D.   On: 08/26/2019 21:54   Vas [place] Lower Extremity Venous (dvt) (only [facility])  Result Date: 08/26/2019  Lower Venous Study Indications: Pain, and discoloration.  Comparison       Left lower extremity venous duplex 04/26/2018 negative for LLE Study:           DVT. Performing Technologist: Gertie FeyMichelle Simonetti MHA, RDMS, RVT, RDCS  Examination Guidelines: A complete evaluation includes B-mode imaging, spectral Doppler, color Doppler, and power Doppler as needed of all accessible portions of each vessel. Bilateral testing is considered an integral part of a complete examination. Limited examinations for reoccurring indications may be performed as noted.  +---------+---------------+---------+-----------+----------+--------------+  RIGHT     Compressibility Phasicity Spontaneity Properties Thrombus Aging  +---------+---------------+---------+-----------+----------+--------------+  CFV       Full            Yes       Yes                                    +---------+---------------+---------+-----------+----------+--------------+  SFJ       Full                                                             +---------+---------------+---------+-----------+----------+--------------+  FV Prox   Full                                                             +---------+---------------+---------+-----------+----------+--------------+  FV Mid    Full                                                              +---------+---------------+---------+-----------+----------+--------------+  FV Distal Full                                                             +---------+---------------+---------+-----------+----------+--------------+  PFV       Full                                                             +---------+---------------+---------+-----------+----------+--------------+  POP       Full            Yes       Yes                                    +---------+---------------+---------+-----------+----------+--------------+  PTV       Full                                                             +---------+---------------+---------+-----------+----------+--------------+  PERO      Full                                                             +---------+---------------+---------+-----------+----------+--------------+   +---------+---------------+---------+-----------+----------+--------------+  LEFT      Compressibility Phasicity Spontaneity Properties Thrombus Aging  +---------+---------------+---------+-----------+----------+--------------+  CFV       Full            Yes       Yes                                    +---------+---------------+---------+-----------+----------+--------------+  SFJ       Full                                                             +---------+---------------+---------+-----------+----------+--------------+  FV Prox   Full                                                             +---------+---------------+---------+-----------+----------+--------------+  FV Mid    Full                                                             +---------+---------------+---------+-----------+----------+--------------+  FV Distal Full                                                             +---------+---------------+---------+-----------+----------+--------------+  PFV       Full                                                              +---------+---------------+---------+-----------+----------+--------------+  POP       Full            Yes       Yes                                    +---------+---------------+---------+-----------+----------+--------------+  PTV       Full                                                             +---------+---------------+---------+-----------+----------+--------------+  PERO      Full                                                             +---------+---------------+---------+-----------+----------+--------------+     Summary: Right: There is no evidence of deep vein thrombosis in the lower extremity. No cystic structure found in the popliteal fossa. Left: There is no evidence of deep vein thrombosis in the lower extremity. No cystic structure found in the popliteal fossa.  *See table(s) above for measurements and observations.    Preliminary    Procedures Procedures (including critical care time)  Medications Ordered in ED Medications  sodium chloride flush (NS) 0.9 % injection 3 mL (3 mLs Intravenous Not Given 08/26/19 2345)  lidocaine (LIDODERM) 5 % 1 patch (has no administration in time range)  sodium chloride 0.9 % bolus 500 mL (0 mLs Intravenous Stopped 08/26/19 2344)  sodium phosphate (FLEET) 7-19 GM/118ML enema 1 enema (  1 enema Rectal Given 08/26/19 2232)  morphine 4 MG/ML injection 4 mg (4 mg Intravenous Given 08/26/19 2237)   Initial Impression / Assessment and Plan / ED Course  I have reviewed the triage vital signs and the nursing notes.  Pertinent labs & imaging results that were available during my care of the patient were reviewed by me and considered in my medical decision making (see chart for details).  85 year old female appears chronically ill and frail presents for evaluation of multiple complaints.  Afebrile, nonseptic appearing.  Has been constipated x2 weeks.  Has been using home docusate tablets without relief of symptoms.  She is passing gas.  Rectal exam with small  formed stool balls without impaction.  Will give enema.  No melena or hematochezia.  Abdomen soft, nontender without rebound or guarding.  Patient also with back pain x1 month.  Was seen by PCP for this and prescribed tramadol.  No radiation.  No abdominal pain radiating to back to suggest dissection or aneurysm.  Pain located to bilateral lower psoas muscles and over SI joints.  No radicular symptoms.  No red flags for back pain.  Have low suspicion for cauda equina, discitis, osteomyelitis, transverse myelitis.  Patient has had increasing fatigue as well at home with decreased p.o. appetite.  This is been going on approximately 3 months.  Family states patient has also had bilateral lower extremity swelling x2 months.  No history of heart failure.  She is on chronic oxygen for COPD.  Daughter states they have been using approximately 3-6 L while ambulating which she is at baseline.  No known COVID exposures.  Heart clear.  Lungs with coarse breath sounds.  Respiratory rate 20.  No respiratory distress.  Low suspicion for PE, ACS. Delta Troponin negative.  She does have coarse cough.  Will DC home with azithromycin for possible pneumonia. Lower extremity ultrasound without evidence of DVT.  She has good skin perfusion.  She does have mild erythema and 1+ pitting edema however this does not appear to be cellulitic in nature. Seems to be chronic in nature. No evidence of cellulitis, abscess on exam.  She does have some chronic venous stasis skin changes to bilateral lower extremities.  Low suspicion for arterial occlusion.  She is hypertensive in the emergency department.  She has not taken her home blood pressure medicines.  Discussed giving these the emergency department however she does not want this at this time.  Patient was given enema without bowel movement emergency department.  Family requesting DC home at this time.  Will DC home with bowel regimen.  To return for any worsening symptoms.  Family to  follow-up with PCP closely.  The patient has been appropriately medically screened and/or stabilized in the ED. I have low suspicion for any other emergent medical condition which would require further screening, evaluation or treatment in the ED or require inpatient management.  Patient is hemodynamically stable and in no acute distress.  Patient able to ambulate in department prior to ED.  Evaluation does not show acute pathology that would require ongoing or additional emergent interventions while in the emergency department or further inpatient treatment.  I have discussed the diagnosis with the patient and answered all questions.  Pain is been managed while in the emergency department and patient has no further complaints prior to discharge.  Patient is comfortable with plan discussed in room and is stable for discharge at this time.  I have discussed strict return precautions for returning  to the emergency department.  Patient was encouraged to follow-up with PCP/specialist refer to at discharge.    Clinical Course as of Aug 25 2348  Tue Aug 26, 2019  2346 No UTI  Urinalysis, Routine w reflex microscopic(!) [BH]  2346 Leukocytosis, Hgb at baseline  CBC(!) [BH]  2347 Hyponatremia given IVF, hyperglycemia  Basic metabolic panel(!) [BH]  2347 No acute pathology  CT L-SPINE NO CHARGE [BH]  2347 Constipation  CT ABDOMEN PELVIS WO CONTRAST [BH]  2347 No DVT  VAS [place] LOWER EXTREMITY VENOUS (DVT) (ONLY [facility]) [BH]  2348 Infiltrates vs scarring, giving abx  DG Chest 2 View [BH]  2348 No ST/T changes. No STEMI  EKG 12-Lead [BH]  2348 Negative  Troponin I (High Sensitivity) [BH]    Clinical Course User Index [BH] Sondos Wolfman A, PA-C   Patient has been seen evaluated by attending physician, Dr. Adela Lank who agrees with above treatment, plan and disposition.   Final Clinical Impressions(s) / ED Diagnoses   Final diagnoses:  Back pain  Generalized abdominal pain  Constipation,  unspecified constipation type  Pedal edema  Community acquired pneumonia, unspecified laterality    ED Discharge Orders         Ordered    azithromycin (ZITHROMAX) 250 MG tablet  Daily     08/26/19 2339    lidocaine (LIDODERM) 5 %  Every 24 hours     08/26/19 2349           Mariaha Ellington A, PA-C 08/26/19 2349    Melene Plan, DO 08/27/19 1510

## 2019-08-26 NOTE — ED Notes (Signed)
Pt placed on bedpan

## 2019-08-26 NOTE — ED Notes (Signed)
IV team at bedside 

## 2019-08-26 NOTE — Telephone Encounter (Signed)
I called the pts daughter in law and informed her of the message below.

## 2019-08-27 NOTE — ED Notes (Signed)
Discharge instructions discussed with pt. And caregiver at bedside. Pt. And caregiver were able to verbalized understanding. No questions at this time.

## 2019-08-28 ENCOUNTER — Other Ambulatory Visit: Payer: Self-pay

## 2019-08-28 ENCOUNTER — Telehealth: Payer: Self-pay

## 2019-08-28 ENCOUNTER — Telehealth (INDEPENDENT_AMBULATORY_CARE_PROVIDER_SITE_OTHER): Payer: Federal, State, Local not specified - PPO | Admitting: Family Medicine

## 2019-08-28 DIAGNOSIS — K5900 Constipation, unspecified: Secondary | ICD-10-CM

## 2019-08-28 LAB — URINE CULTURE

## 2019-08-28 NOTE — Progress Notes (Signed)
Virtual Visit via Video Note  I connected with Randa EvensJoanne and her son Susy FrizzleMatt  on 08/28/19 at 11:00 AM EDT by a video enabled telemedicine application and verified that I am speaking with the correct person using two identifiers.  Location patient: home Location provider:work or home office Persons participating in the virtual visit: patient, provider, provider's son and daughter in law  I discussed the limitations of evaluation and management by telemedicine and the availability of in person appointments. The patient expressed understanding and agreed to proceed.   HPI:  Acute visit for Constipation: -diagnosed at an ER visit yesterday per son -she had presented after not having a BM for 2 weeks according to notes and son; per son it seems it was [facility] for [facility] -she had a CT scan in the ER with constipation confirmed -per caregivers she was sent home on 8 capfuls of mirilax to take in 32 oz of water - which she did in the last 24 hours -however, son reports she has not had anything else PO for 2 days and she continues to moan in discomfort intermittently -they reports she has had some small hard stools today; no vomiting, bleeding or diarrhea -they report they were also told to do an enema at home which they have not done yet, they wonder if they can attempt disimpaction again at home -per ER notes she uses O2 and has bad COPD and had incidental rhonchi on exam and possible developing pneumonia on xray so also was given Azithromycin for presumed CAP -had LE edema for several months per notes so LE US was done to R/o DVT -she has switched to liquid tylenol for the pain  ROS: See pertinent positives and negatives per HPI.  Past Medical History:  Diagnosis Date  . Anemia   . B12 DEFICIENCY 03/31/2008  . COPD 11/27/2008  . ESOPHAGEAL STRICTURE 01/29/2009  . MASS, LUNG 10/13/2009  . NASAL FRACTURE 08/12/2009  . OSTEOARTHRITIS 03/31/2008   Hips  . OSTEOPOROSIS 12/27/2007  .  PEDAL EDEMA 12/27/2007  . Pneumonia 07/10/2016  . PULMONARY NODULE 10/21/2009  . RESPIRATORY FAILURE, CHRONIC 06/29/2010  . Shortness of breath dyspnea     Past Surgical History:  Procedure Laterality Date  . APPENDECTOMY     '38  . BALLOON DILATION N/A 05/10/2015   Procedure: BALLOON DILATION;  Surgeon: Louis Meckelobert D Kaplan, MD;  [facility];  Service: Endoscopy;  Laterality: N/A;  . BALLOON DILATION N/A 08/02/2015   Procedure: BALLOON DILATION;  Surgeon: Louis Meckelobert D Kaplan, MD;  [facility];  Service: Endoscopy;  Laterality: N/A;  . BREAST SURGERY     biopsy '47  . CATARACT EXTRACTION Bilateral    last '05  . CHOLECYSTECTOMY     laparoscopic'10  . ESOPHAGOGASTRODUODENOSCOPY     with dilatation x3  . ESOPHAGOGASTRODUODENOSCOPY (EGD) WITH PROPOFOL N/A 05/10/2015   Procedure: ESOPHAGOGASTRODUODENOSCOPY (EGD) WITH PROPOFOL;  Surgeon: Louis Meckelobert D Kaplan, MD;  [facility];  Service: Endoscopy;  Laterality: N/A;  balloon dilation  . ESOPHAGOGASTRODUODENOSCOPY (EGD) WITH PROPOFOL N/A 08/02/2015   Procedure: ESOPHAGOGASTRODUODENOSCOPY (EGD) WITH PROPOFOL;  Surgeon: Louis Meckelobert D Kaplan, MD;  [facility];  Service: Endoscopy;  Laterality: N/A;  . LUMBAR LAMINECTOMY    . NASAL RECONSTRUCTION WITH SEPTAL REPAIR     '10  . RECTAL PROLAPSE REPAIR     '06  . skin graf     '98    Family History  Problem Relation Age of Onset  . Hypertension Son   .  Asthma Mother   . Abdominal Wall Hernia Mother   . Stomach cancer Mother     SOCIAL HX: see hpi   Current Outpatient Medications:  .  Acetaminophen (TYLENOL) 325 MG CAPS, Take 1 capsule by mouth every 6 (six) hours as needed., Disp: , Rfl:  .  albuterol (PROVENTIL) (2.5 MG/3ML) 0.083% nebulizer solution, Take 3 mLs (2.5 mg total) by nebulization every 4 (four) hours as needed for wheezing or shortness of breath., Disp: 150 mL, Rfl: 2 .  ALPRAZolam (XANAX) 0.25 MG tablet, Take 1 tablet (0.25 mg total) by mouth 2 (two)  times daily as needed for anxiety., Disp: 30 tablet, Rfl: 0 .  azithromycin (ZITHROMAX) 250 MG tablet, Take 1 tablet (250 mg total) by mouth daily. Take first 2 tablets together, then 1 every day until finished., Disp: 6 tablet, Rfl: 0 .  Calcium-Vitamin D-Vitamin K (VIACTIV) 500-500-40 MG-UNT-MCG CHEW, Chew by mouth daily., Disp: , Rfl:  .  Cholecalciferol 50000 units TABS, Take 5,000 Units by mouth., Disp: , Rfl:  .  clotrimazole (LOTRIMIN) 1 % cream, Apply 1 application topically 2 (two) times daily., Disp: 30 g, Rfl: 0 .  cyanocobalamin (,VITAMIN B-12,) 1000 MCG/ML injection, Inject 29ml monthly into muscle, Disp: 3 mL, Rfl: 3 .  dextromethorphan-guaiFENesin (MUCINEX DM) 30-600 MG 12hr tablet, Take 1 tablet by mouth 2 (two) times daily as needed (cough/congestion)., Disp: , Rfl:  .  diclofenac sodium (VOLTAREN) 1 % GEL, Apply 2 g topically 4 (four) times daily., Disp: 100 g, Rfl: 0 .  escitalopram (LEXAPRO) 5 MG tablet, Take 1 tablet (5 mg total) by mouth daily., Disp: 90 tablet, Rfl: 3 .  famotidine (PEPCID) 20 MG tablet, One at bedtime, Disp: 30 tablet, Rfl: 2 .  furosemide (LASIX) 20 MG tablet, TAKE 1 TABLET(20 MG) BY MOUTH DAILY AS NEEDED FOR SWELLING, Disp: 90 tablet, Rfl: 0 .  levofloxacin (LEVAQUIN) 25 MG/ML solution, Take 20 mLs (500 mg total) by mouth daily., Disp: 140 mL, Rfl: 0 .  lidocaine (LIDODERM) 5 %, Place 1 patch onto the skin daily. Remove & Discard patch within 12 hours or as directed by MD, Disp: 30 patch, Rfl: 0 .  mirtazapine (REMERON) 15 MG tablet, Take 1 tablet (15 mg total) by mouth at bedtime., Disp: 90 tablet, Rfl: 3 .  mometasone-formoterol (DULERA) 200-5 MCG/ACT AERO, Take 2 puffs first thing in am and then another 2 puffs about 12 hours later., Disp: 1 Inhaler, Rfl: 11 .  OXYGEN, Oxygen 3lpm 24/7, Disp: , Rfl:  .  pantoprazole (PROTONIX) 40 MG tablet, Take 1 tablet (40 mg total) by mouth daily. Take 30-60 min before first meal of the day, Disp: 90 tablet, Rfl: 1 .   Polyethyl Glycol-Propyl Glycol (SYSTANE OP), Place 1 drop into both eyes daily. , Disp: , Rfl:  .  predniSONE (DELTASONE) 10 MG tablet, Take  4 each am x 2 days,   2 each am x 2 days,  1 each am x 2 days and stop (Patient not taking: Reported on 08/12/2019), Disp: 14 tablet, Rfl: 0 .  senna-docusate (SENOKOT-S) 8.6-50 MG tablet, Take 1 tablet by mouth 2 (two) times daily as needed for mild constipation., Disp: 90 tablet, Rfl: 1 .  Tiotropium Bromide Monohydrate (SPIRIVA RESPIMAT) 2.5 MCG/ACT AERS, Inhale 2 puffs into the lungs daily., Disp: 4 g, Rfl: 11 .  triamcinolone ointment (KENALOG) 0.1 %, Apply 1 application topically 2 (two) times daily., Disp: 30 g, Rfl: 1  EXAM:  VITALS per patient if  applicable: none  GENERAL: alert then resting comfortably in bed for most of the rest of the visit, says hello, lying in bed, twice during the 40 minute visit groaned briefly - son and DIL did the talking  HEENT: atraumatic, conjunttiva clear, no obvious abnormalities on inspection of external nose and ears, nasal canula in place  LUNGS:  no obvious gross SOB, gasping or wheezing  CV: no obvious cyanosis of the face  ASSESSMENT AND PLAN:  Discussed the following assessment and plan: More than 50% of over 40 minutes spent in total in caring for this patient was spent face-to-face with the patient, counseling and/or coordinating care.    Constipation, unspecified constipation type  -we discussed possible serious and likely etiologies, options for evaluation and workup, limitations of telemedicine visit vs in person visit, treatment options, treatment risks, potential complications and precautions. Pt caregivers prefer to talk via telemedicine today rather then risking or undertaking an in person visit at this moment. They refuse and in person visit today, though I advised it. They prefer to continue to try treatment with mirilax and add fluids with electrolytes. They did agree to seek emergency care if  worsening, not successful with PO intake of fluids containing electrolytes, still unable to eat tomorrow,  worsening, new symptoms arise, or if is not improving with treatment. They agree to close follow up with PCP next week. Sent message to admin staff to schedule and advised them to call if they do not hear form our office soon. We spent 40 minutes. They had a lot of questions about the initial rib pain, tylenol dosing, treatments for constipation and more. I advised no more then 3000mg  of tylenol in 1 day and trying 500mg  every 6 hours as needed. I advised 1 capful of mirilax in liquid twice daily for several more days until successful then backing down to 1 capful per day until loose stools for several days and seeking care if this is not working.Advised they aslo could try the enema that was advised by the ER doctor. I advised she may need admission for this if cant eat, tol oral intake of more then water or if not successful or worsening. Advised they also need follow up with PCP about ongoing pain if that does not resolve with treating the constipation and the possible CAP. Copying note to PCP.    I discussed the assessment and treatment plan with the patient. The patient was provided an opportunity to ask questions and all were answered. The patient agreed with the plan and demonstrated an understanding of the instructions.   The patient was advised to call back or seek an in-person evaluation if the symptoms worsen or if the condition fails to improve as anticipated.   Lucretia Kern, DO

## 2019-08-28 NOTE — Telephone Encounter (Signed)
Pt has been to ER and is in extreme pain in her abdomin / Pt is continuing to get worse and her son is worried/ please advise

## 2019-08-29 NOTE — Progress Notes (Signed)
Please just check in on [place] before weekend. See if any relief of abdominal pain. Schedule follow up next week (or at least telephone check in) if desired.

## 2019-08-29 NOTE — Telephone Encounter (Signed)
Pt son has called to make a follow up appt for next to see you but you do not have anything available can you please let me know what you would like for [place] to do.

## 2019-08-29 NOTE — Progress Notes (Signed)
Left a message at the pts home number to call back as below.  CRM also created.

## 2019-09-01 ENCOUNTER — Ambulatory Visit: Payer: Federal, State, Local not specified - PPO | Admitting: Family Medicine

## 2019-09-01 ENCOUNTER — Telehealth: Payer: Self-pay | Admitting: *Deleted

## 2019-09-01 ENCOUNTER — Other Ambulatory Visit: Payer: Self-pay | Admitting: Family Medicine

## 2019-09-01 ENCOUNTER — Other Ambulatory Visit: Payer: Self-pay

## 2019-09-01 VITALS — BP 130/80 | HR 85 | Temp 98.4°F

## 2019-09-01 DIAGNOSIS — R630 Anorexia: Secondary | ICD-10-CM

## 2019-09-01 DIAGNOSIS — R1319 Other dysphagia: Secondary | ICD-10-CM

## 2019-09-01 DIAGNOSIS — R1310 Dysphagia, unspecified: Secondary | ICD-10-CM

## 2019-09-01 DIAGNOSIS — R109 Unspecified abdominal pain: Secondary | ICD-10-CM | POA: Diagnosis not present

## 2019-09-01 DIAGNOSIS — J9611 Chronic respiratory failure with hypoxia: Secondary | ICD-10-CM

## 2019-09-01 DIAGNOSIS — J449 Chronic obstructive pulmonary disease, unspecified: Secondary | ICD-10-CM

## 2019-09-01 LAB — COMPREHENSIVE METABOLIC PANEL
ALT: 12 U/L (ref 0–35)
AST: 17 U/L (ref 0–37)
Albumin: 3.1 g/dL — ABNORMAL LOW (ref 3.5–5.2)
Alkaline Phosphatase: 88 U/L (ref 39–117)
BUN: 15 mg/dL (ref 6–23)
CO2: 32 mEq/L (ref 19–32)
Calcium: 8.9 mg/dL (ref 8.4–10.5)
Chloride: 95 mEq/L — ABNORMAL LOW (ref 96–112)
Creatinine, Ser: 0.39 mg/dL — ABNORMAL LOW (ref 0.40–1.20)
GFR: 152.93 mL/min (ref >60.00)
Glucose, Bld: 89 mg/dL (ref 70–99)
Potassium: 4.2 mEq/L (ref 3.5–5.1)
Sodium: 135 mEq/L (ref 135–145)
Total Bilirubin: 0.4 mg/dL (ref 0.2–1.2)
Total Protein: 5.8 g/dL — ABNORMAL LOW (ref 6.0–8.3)

## 2019-09-01 LAB — CBC WITH DIFFERENTIAL/PLATELET
Basophils Absolute: 0.1 10*3/uL (ref 0.0–0.1)
Basophils Relative: 0.4 % (ref 0.0–3.0)
Eosinophils Absolute: 0.2 10*3/uL (ref 0.0–0.7)
Eosinophils Relative: 1.6 % (ref 0.0–5.0)
HCT: 32 % — ABNORMAL LOW (ref 36.0–46.0)
Hemoglobin: 10.5 g/dL — ABNORMAL LOW (ref 12.0–15.0)
Lymphocytes Relative: 11 % — ABNORMAL LOW (ref 12.0–46.0)
Lymphs Abs: 1.4 10*3/uL (ref 0.7–4.0)
MCHC: 32.8 g/dL (ref 30.0–36.0)
MCV: 91.3 fl (ref 78.0–100.0)
Monocytes Absolute: 1.5 10*3/uL — ABNORMAL HIGH (ref 0.1–1.0)
Monocytes Relative: 11.4 % (ref 3.0–12.0)
Neutro Abs: 9.8 10*3/uL — ABNORMAL HIGH (ref 1.4–7.7)
Neutrophils Relative %: 75.6 % (ref 43.0–77.0)
Platelets: 491 10*3/uL — ABNORMAL HIGH (ref 150.0–400.0)
RBC: 3.51 Mil/uL — ABNORMAL LOW (ref 3.87–5.11)
RDW: 13.8 % (ref 11.5–15.5)
WBC: 13 10*3/uL — ABNORMAL HIGH (ref 4.0–10.5)

## 2019-09-01 MED ORDER — LEVOFLOXACIN 25 MG/ML PO SOLN: 500.0000 mg | Freq: Every day | ORAL | 0 refills | Status: DC

## 2019-09-01 MED ORDER — KETOROLAC TROMETHAMINE 30 MG/ML IJ SOLN
30.0000 mg | Freq: Once | INTRAMUSCULAR | Status: AC
  Administered 2019-09-01: 30 mg via INTRAMUSCULAR

## 2019-09-01 NOTE — Progress Notes (Signed)
Sharon [name] DOB: 01/29/1923 Encounter date: 09/01/2019  This is a 85 y.o. female who presents with Chief Complaint  Patient presents with  . left rib pain    x weeks     History of present illness: Sharon [name] was brought in today by son and daughter after call from son concerned about her recent decline. She was seen lats week in ER with abdominal pain. Took a little time, but miralax finally worked for her on Thursday evening into Friday. Had at least 5 large bowel movements and had immediate relief of pain. This relief did not last though and was almost immediately replaced by different, left sided/left sided abd/left sided flank pain. Sharon [name] describes this left sided pain as nagging and constant. Hard to get comfortable/stay comfortable. No Bm since Firday, but also hasn't been eating. Per son she started sleeping much more after episodes of BMs. Initially wasn't bad, but then became excessive over the weekend sleeping 13-14 hours, no appetite and without eating at home until last night (tiny bit of braut and ice cream). She is taking in pedialyte, OJ and they are helping her get up to urinate (multiple times/day). She is weak and full assist with everything now including changing positions.   At one point over weekend O2 need was increased to 6L, but she is not tolerating 4L. Coughing amount is at baseline. Feels like nasal congestion makes breathing feel harder.   She is having more difficulty swallowing now as well. Has had swallowing issues on and off for years with hx of esoph stretching, but now is burping with any type of swallowing. Only really taking liquids at this point and states it is just hard to swallow. At this point she is drinking from straw which seems easier. Not coughing with drinking but they are using caution.   She is nauseated and states that pain is better than at ER visit (rates as 10) but can still be as severe as 8/10. Doesn't feel well with pain medication and  they worry because they feel this had constipated her previously.   No fevers at home.   Does feel urine is darker. Last stools were more gelatinous, pink, but not blood. Not sure if this is somewhat due to intake (tylenol red liquid and pedialyte red liquid).   Tramadol was not helping as much pain wise and tended to worsen constipation.   She is very anxious as well. They have not given xanax (thought expired). She has been taking tylenol for pain (not significant relief at this point).    Allergies  Allergen Reactions  . Penicillins Rash    Has patient had a PCN reaction causing immediate rash, facial/tongue/throat swelling, SOB or lightheadedness with hypotension: Yes Has patient had a PCN reaction causing severe rash involving mucus membranes or skin necrosis: Unknown Has patient had a PCN reaction that required hospitalization: Unknown Has patient had a PCN reaction occurring within the last 10 years: No If all of the above answers are "NO", then may proceed with Cephalosporin use.    Current Meds  Medication Sig  . Acetaminophen (TYLENOL) 325 MG CAPS Take 1 capsule by mouth every 6 (six) hours as needed.  Marland Kitchen albuterol (PROVENTIL) (2.5 MG/3ML) 0.083% nebulizer solution Take 3 mLs (2.5 mg total) by nebulization every 4 (four) hours as needed for wheezing or shortness of breath.  . ALPRAZolam (XANAX) 0.25 MG tablet Take 1 tablet (0.25 mg total) by mouth 2 (two) times daily as needed  for anxiety.  . Calcium-Vitamin D-Vitamin K (VIACTIV) 376-283-15 MG-UNT-MCG CHEW Chew by mouth daily.  . Cholecalciferol 50000 units TABS Take 5,000 Units by mouth.  . clotrimazole (LOTRIMIN) 1 % cream Apply 1 application topically 2 (two) times daily.  . cyanocobalamin (,VITAMIN B-12,) 1000 MCG/ML injection Inject 39ml monthly into muscle  . dextromethorphan-guaiFENesin (MUCINEX DM) 30-600 MG 12hr tablet Take 1 tablet by mouth 2 (two) times daily as needed (cough/congestion).  Marland Kitchen diclofenac sodium  (VOLTAREN) 1 % GEL Apply 2 g topically 4 (four) times daily.  Marland Kitchen escitalopram (LEXAPRO) 5 MG tablet Take 1 tablet (5 mg total) by mouth daily.  . famotidine (PEPCID) 20 MG tablet One at bedtime  . furosemide (LASIX) 20 MG tablet TAKE 1 TABLET(20 MG) BY MOUTH DAILY AS NEEDED FOR SWELLING  . lidocaine (LIDODERM) 5 % Place 1 patch onto the skin daily. Remove & Discard patch within 12 hours or as directed by MD  . mirtazapine (REMERON) 15 MG tablet Take 1 tablet (15 mg total) by mouth at bedtime.  . mometasone-formoterol (DULERA) 200-5 MCG/ACT AERO Take 2 puffs first thing in am and then another 2 puffs about 12 hours later.  . OXYGEN Oxygen 3lpm 24/7  . pantoprazole (PROTONIX) 40 MG tablet Take 1 tablet (40 mg total) by mouth daily. Take 30-60 min before first meal of the day  . Polyethyl Glycol-Propyl Glycol (SYSTANE OP) Place 1 drop into both eyes daily.   Marland Kitchen senna-docusate (SENOKOT-S) 8.6-50 MG tablet Take 1 tablet by mouth 2 (two) times daily as needed for mild constipation.  . Tiotropium Bromide Monohydrate (SPIRIVA RESPIMAT) 2.5 MCG/ACT AERS Inhale 2 puffs into the lungs daily.  Marland Kitchen triamcinolone ointment (KENALOG) 0.1 % Apply 1 application topically 2 (two) times daily.    Review of Systems  Constitutional: Positive for activity change, appetite change and fatigue. Negative for chills and fever.  HENT: Positive for congestion and trouble swallowing. Negative for sore throat.   Respiratory: Positive for cough (thick, purulent colored phlegm. Improved from dark green end of august with levaquin tx) and shortness of breath. Negative for chest tightness and wheezing.   Cardiovascular: Negative for chest pain, palpitations and leg swelling.  Gastrointestinal: Positive for abdominal pain, diarrhea (currently with mild stool leakage in office) and nausea. Negative for blood in stool and vomiting. Constipation: not eating; last BM was 3 days ago.  Genitourinary: Negative for difficulty urinating and  dysuria.       Getting rash on bottom from multiple stools  Neurological: Positive for weakness. Negative for dizziness and headaches.  Psychiatric/Behavioral: Positive for sleep disturbance. The patient is nervous/anxious.     Objective:  BP 130/80 (BP Location: Left Arm, Patient Position: Sitting, Cuff Size: Normal)   Pulse 85   Temp 98.4 F (36.9 C) (Oral)   SpO2 99%       BP Readings from Last 3 Encounters:  09/01/19 130/80  08/26/19 (!) 186/78  08/12/19 120/78   Wt Readings from Last 3 Encounters:  08/26/19 110 lb (49.9 kg)  07/25/19 110 lb 3.2 oz (50 kg)  04/22/19 106 lb (48.1 kg)    Physical Exam Constitutional:      Appearance: She is cachectic. She is ill-appearing.     Interventions: Nasal cannula in place.     Comments: 4L O2 [place]  Neck:     Thyroid: No thyroid mass, thyromegaly or thyroid tenderness.  Cardiovascular:     Rate and Rhythm: Normal rate.     Heart sounds: Murmur present.  Systolic murmur present with a grade of 2/6.  Pulmonary:     Breath sounds: Decreased air movement present. Examination of the right-upper field reveals rales. Examination of the left-upper field reveals rales. Examination of the right-middle field reveals rales. Examination of the left-middle field reveals rales. Examination of the right-lower field reveals rales. Examination of the left-lower field reveals rales. Decreased breath sounds and rales present. No wheezing or rhonchi.  Abdominal:     General: Abdomen is flat. Bowel sounds are normal. There is no distension.     Palpations: Abdomen is soft.     Tenderness: There is abdominal tenderness. There is no guarding or rebound.     Comments: There is suprapubic tenderness, left sided abd tenderness (no guarding), lower rib tenderness, and left flank tenderness. All palpation left side abd/flank/back seems to reproduce presenting discomfort.   Genitourinary:    Comments: She has active liquid, light brown, mucous stool that is  draining. No noted blood in stool.  Musculoskeletal:     Right lower leg: No edema.     Left lower leg: No edema.  Lymphadenopathy:     Cervical: No cervical adenopathy.  Skin:    Comments: Feet have chronic venous stasis discoloration.   Abdominal skin is hot, but no rash/skin discoloration appreciated.   Neurological:     Mental Status: She is lethargic.     Motor: Weakness and atrophy present.     Comments: She is full assist to stand. Unable to walk currently. Difficulty even standing with full assist.      Assessment/Plan  1. Abdominal pain, unspecified abdominal location CT scan last week did not show any significant abnormality to explain current pain except for constipation. Pain has changed since ER visit. Family and patient strongly wish to remain out of ER/hospital if possible. Since she is here in office, we will get some bloodwork/check urine. Of note, wbc count was elevated at hospital; would like to see trend. Due to flank discomfort, will also recheck urine.   Sharon [name] and son, daughter are on board with hospice consult. Son brought this up with discussion on phone today and we discussed together in office. Her lung condition has been deteriorating, esp this year with frequent infections and progressive loss of independence as her strength has faded and shortness of breath has increased. Hospice has already talked with family (prior to note completion) and will be out at house to assess Sharon [name] tomorrow afternoon.   I did call back this evening and she was feeling somewhat improved after injection in office and was having small amount of dinner. They were ok with waiting for further results to touch base and did not feel she needed additional pain medication tonight. We did discuss ok to use xanax which she has tolerated in past for anxiety as this also helps her relax.   - ketorolac (TORADOL) 30 MG/ML injection 30 mg - CBC with Differential/Platelet; Future -  Comprehensive metabolic panel; Future - Urinalysis; Future - Culture, Urine; Future - DG Abd Acute W/Chest; Future - Culture, Urine - Urinalysis - Comprehensive metabolic panel - CBC with Differential/Platelet  2. Chronic respiratory failure with hypoxia (HCC) See above. Sputum is thick, purulent, but per family improved from end of August.  - Ambulatory referral to Hospice; Future  3. COPD GOLD III  Follows with pulmonology. O2 dept. Today breathing is at baseline. - Ambulatory referral to Hospice; Future  4. Esophageal dysphagia More difficulty with swallowing. This is occurring with  liquids and solids. Can consider further assessment pending above.  5. Loss of appetite Will monitor; see above. - Ambulatory referral to Hospice; Future    Return for pending bloodwork.     Theodis ShoveJunell Xzaviar Maloof, MD

## 2019-09-01 NOTE — Telephone Encounter (Signed)
Had long discussion with matt -   *are there providers with openings this afternoon that can see Sharon [name]? She is doing worse, increased abd pain. Weak, not eating. They don't want to go to ER.  *if not, we can order some bloodwork and go from there.  *also interested in hospice care/getting hospice onboard. Not sure how quickly they can evaluate. Please see about appointment and let them know. Talk with me about rest. Thanks!

## 2019-09-01 NOTE — Patient Instructions (Signed)
Hospice will be in contact with you today.

## 2019-09-01 NOTE — Telephone Encounter (Signed)
Spoke with the pts son and scheduled an appt for today at 1:15pm with Dr Sarajane Jews.

## 2019-09-01 NOTE — Telephone Encounter (Signed)
Copied from [place] [id number]. Topic: General - Other >> Sep 01, 2019 10:32 AM Leward Quan A wrote: Reason for CRM: Patient son Rosali Augello called to ask Dr Ethlyn Gallery to give them a call today please say that it is very important that he is called today. Per Catalina Antigua it is very very important to hear from Dr Ethlyn Gallery today.  He can be reached at Ph# [phone]

## 2019-09-02 LAB — URINALYSIS, ROUTINE W REFLEX MICROSCOPIC
Bilirubin Urine: NEGATIVE
Ketones, ur: NEGATIVE
Nitrite: NEGATIVE
Specific Gravity, Urine: 1.015 (ref 1.000–1.030)
Urine Glucose: NEGATIVE
Urobilinogen, UA: 0.2 (ref 0.0–1.0)
pH: 7.5 (ref 5.0–8.0)

## 2019-09-03 ENCOUNTER — Other Ambulatory Visit: Payer: Self-pay | Admitting: Family Medicine

## 2019-09-03 LAB — URINE CULTURE
MICRO NUMBER:: 877723
SPECIMEN QUALITY:: ADEQUATE

## 2019-09-03 MED ORDER — HYDROCODONE-ACETAMINOPHEN 7.5-325 MG/15ML PO SOLN: 5.0000 mL | Freq: Two times a day (BID) | ORAL | 0 refills | Status: DC | PRN

## 2019-09-03 NOTE — Telephone Encounter (Signed)
This was verbalized to Dr.Koberlien and CMA " Dr. Sarajane Jews was unable to see pt due to not having enough time to properly assess pt in 15 appt. Slot" Dr. Inocente Salles CMA stated she will advised pt to go to the ED as advised before.

## 2019-09-04 NOTE — Progress Notes (Signed)
Spoke with son this evening. Things about same for ms. Sharon [name]. She was getting some rest after taking xanax. She is still having pain. They are all signed up with hospice at this point. They are sending nurse out again tomorrow. I asked about pain control and felt that it was lacking some. I have sent in liquid hydrocodone in case of severe pain. In meanwhile they will keep trying tylenol. They are concerned with constipation from pain meds, but right now she is having some gelatinous stools. Still not eating well.   We discussed "magic butt" compounded if needed for bottom irritation to include cholestyramine with zinc oxide. He will let me know if needed.

## 2019-09-05 ENCOUNTER — Encounter: Payer: Federal, State, Local not specified - PPO | Admitting: Adult Health

## 2019-09-08 NOTE — Telephone Encounter (Signed)
Pt seen in office 09/01/19.

## 2019-09-26 ENCOUNTER — Encounter: Payer: Self-pay | Admitting: Family Medicine

## 2019-11-29 ENCOUNTER — Encounter: Payer: Self-pay | Admitting: Family Medicine

## 2019-12-08 ENCOUNTER — Telehealth: Payer: Self-pay | Admitting: *Deleted

## 2019-12-08 DIAGNOSIS — S91309A Unspecified open wound, unspecified foot, initial encounter: Secondary | ICD-10-CM

## 2019-12-08 NOTE — Telephone Encounter (Signed)
Copied from [place] [id number]. Topic: General - Other >> Dec 08, 2019  4:15 PM Leward Quan A wrote: Reason for CRM: Patient son Marcello Moores / Catalina Antigua called to inform Dr Ethlyn Gallery that mom will need a recommendation or referral to a podiatrist for an open wound on left  heel. Please call at Ph# [phone]

## 2019-12-09 NOTE — Telephone Encounter (Signed)
I called the pt and informed her and the family member the referral was entered and someone will call with appt info.

## 2019-12-09 NOTE — Telephone Encounter (Signed)
Ok for podiatry referral 

## 2019-12-09 NOTE — Addendum Note (Signed)
Addended by: Agnes Lawrence on: 12/09/2019 02:55 PM   Modules accepted: Orders

## 2019-12-16 ENCOUNTER — Other Ambulatory Visit: Payer: Self-pay

## 2019-12-16 ENCOUNTER — Ambulatory Visit: Payer: Federal, State, Local not specified - PPO | Admitting: Podiatry

## 2019-12-16 VITALS — BP 132/77 | HR 80 | Temp 96.9°F

## 2019-12-16 DIAGNOSIS — L97401 Non-pressure chronic ulcer of unspecified heel and midfoot limited to breakdown of skin: Secondary | ICD-10-CM | POA: Diagnosis not present

## 2019-12-16 DIAGNOSIS — R0989 Other specified symptoms and signs involving the circulatory and respiratory systems: Secondary | ICD-10-CM | POA: Diagnosis not present

## 2019-12-16 MED ORDER — MUPIROCIN 2 % EX OINT: 1.0000 "application " | TOPICAL_OINTMENT | Freq: Two times a day (BID) | CUTANEOUS | 2 refills | Status: DC

## 2019-12-17 NOTE — Progress Notes (Signed)
Subjective:   Patient ID: Sharon [name], female   DOB: 85 y.o.   MRN: [id number]   HPI 85 year old female presents the office with concerns of wounds to the back of her left heel.  She states that she is having on both pain is an ongoing for over a year she said no significant treatment.  She is currently in hospice care and anything given her different treatment options however the areas are still painful mostly the back of the heel.  She tries to avoid pressure of the heel is much as possible.  She is on antibiotics for respiratory issue currently and since getting antibiotics for the past helps the heel.  Review of Systems  All other systems reviewed and are negative.  Past Medical History:  Diagnosis Date  . Anemia   . B12 DEFICIENCY 03/31/2008  . COPD 11/27/2008  . ESOPHAGEAL STRICTURE 01/29/2009  . MASS, LUNG 10/13/2009  . NASAL FRACTURE 08/12/2009  . OSTEOARTHRITIS 03/31/2008   Hips  . OSTEOPOROSIS 12/27/2007  . PEDAL EDEMA 12/27/2007  . Pneumonia 07/10/2016  . PULMONARY NODULE 10/21/2009  . RESPIRATORY FAILURE, CHRONIC 06/29/2010  . Shortness of breath dyspnea     Past Surgical History:  Procedure Laterality Date  . APPENDECTOMY     '38  . BALLOON DILATION N/A 05/10/2015   Procedure: BALLOON DILATION;  Surgeon: Inda Castle, MD;  Location: [facility];  Service: Endoscopy;  Laterality: N/A;  . BALLOON DILATION N/A 08/02/2015   Procedure: BALLOON DILATION;  Surgeon: Inda Castle, MD;  Location: [facility];  Service: Endoscopy;  Laterality: N/A;  . BREAST SURGERY     biopsy '47  . CATARACT EXTRACTION Bilateral    last '05  . CHOLECYSTECTOMY     laparoscopic'10  . ESOPHAGOGASTRODUODENOSCOPY     with dilatation x3  . ESOPHAGOGASTRODUODENOSCOPY (EGD) WITH PROPOFOL N/A 05/10/2015   Procedure: ESOPHAGOGASTRODUODENOSCOPY (EGD) WITH PROPOFOL;  Surgeon: Inda Castle, MD;  Location: [facility];  Service: Endoscopy;  Laterality: N/A;  balloon dilation  .  ESOPHAGOGASTRODUODENOSCOPY (EGD) WITH PROPOFOL N/A 08/02/2015   Procedure: ESOPHAGOGASTRODUODENOSCOPY (EGD) WITH PROPOFOL;  Surgeon: Inda Castle, MD;  Location: [facility];  Service: Endoscopy;  Laterality: N/A;  . LUMBAR LAMINECTOMY    . NASAL RECONSTRUCTION WITH SEPTAL REPAIR     '10  . RECTAL PROLAPSE REPAIR     '06  . skin graf     '98     Current Outpatient Medications:  .  Acetaminophen (TYLENOL) 325 MG CAPS, Take 1 capsule by mouth every 6 (six) hours as needed., Disp: , Rfl:  .  albuterol (PROVENTIL) (2.5 MG/3ML) 0.083% nebulizer solution, Take 3 mLs (2.5 mg total) by nebulization every 4 (four) hours as needed for wheezing or shortness of breath., Disp: 150 mL, Rfl: 2 .  ALPRAZolam (XANAX) 0.25 MG tablet, Take 1 tablet (0.25 mg total) by mouth 2 (two) times daily as needed for anxiety., Disp: 30 tablet, Rfl: 0 .  Calcium-Vitamin D-Vitamin K (VIACTIV) 329-518-84 MG-UNT-MCG CHEW, Chew by mouth daily., Disp: , Rfl:  .  Cholecalciferol 50000 units TABS, Take 5,000 Units by mouth., Disp: , Rfl:  .  clotrimazole (LOTRIMIN) 1 % cream, Apply 1 application topically 2 (two) times daily., Disp: 30 g, Rfl: 0 .  cyanocobalamin (,VITAMIN B-12,) 1000 MCG/ML injection, Inject 64ml monthly into muscle, Disp: 3 mL, Rfl: 3 .  dextromethorphan-guaiFENesin (MUCINEX DM) 30-600 MG 12hr tablet, Take 1 tablet by mouth 2 (two) times daily as needed (cough/congestion)., Disp: ,  Rfl:  .  diclofenac sodium (VOLTAREN) 1 % GEL, Apply 2 g topically 4 (four) times daily., Disp: 100 g, Rfl: 0 .  escitalopram (LEXAPRO) 5 MG tablet, Take 1 tablet (5 mg total) by mouth daily., Disp: 90 tablet, Rfl: 3 .  famotidine (PEPCID) 20 MG tablet, One at bedtime, Disp: 30 tablet, Rfl: 2 .  furosemide (LASIX) 20 MG tablet, TAKE 1 TABLET(20 MG) BY MOUTH DAILY AS NEEDED FOR SWELLING, Disp: 90 tablet, Rfl: 0 .  HYDROcodone-acetaminophen (HYCET) 7.5-325 mg/15 ml solution, Take 5-10 mLs by mouth 2 (two) times daily as needed for  severe pain., Disp: 120 mL, Rfl: 0 .  levofloxacin (LEVAQUIN) 25 MG/ML solution, Take 20 mLs (500 mg total) by mouth daily., Disp: 140 mL, Rfl: 0 .  lidocaine (LIDODERM) 5 %, Place 1 patch onto the skin daily. Remove & Discard patch within 12 hours or as directed by MD, Disp: 30 patch, Rfl: 0 .  mirtazapine (REMERON) 15 MG tablet, Take 1 tablet (15 mg total) by mouth at bedtime., Disp: 90 tablet, Rfl: 3 .  mometasone-formoterol (DULERA) 200-5 MCG/ACT AERO, Take 2 puffs first thing in am and then another 2 puffs about 12 hours later., Disp: 1 Inhaler, Rfl: 11 .  mupirocin ointment (BACTROBAN) 2 %, Apply 1 application topically 2 (two) times daily., Disp: 30 g, Rfl: 2 .  OXYGEN, Oxygen 3lpm 24/7, Disp: , Rfl:  .  pantoprazole (PROTONIX) 40 MG tablet, Take 1 tablet (40 mg total) by mouth daily. Take 30-60 min before first meal of the day, Disp: 90 tablet, Rfl: 1 .  Polyethyl Glycol-Propyl Glycol (SYSTANE OP), Place 1 drop into both eyes daily. , Disp: , Rfl:  .  senna-docusate (SENOKOT-S) 8.6-50 MG tablet, Take 1 tablet by mouth 2 (two) times daily as needed for mild constipation., Disp: 90 tablet, Rfl: 1 .  Tiotropium Bromide Monohydrate (SPIRIVA RESPIMAT) 2.5 MCG/ACT AERS, Inhale 2 puffs into the lungs daily., Disp: 4 g, Rfl: 11 .  triamcinolone ointment (KENALOG) 0.1 %, Apply 1 application topically 2 (two) times daily., Disp: 30 g, Rfl: 1  Allergies  Allergen Reactions  . Penicillins Rash    Has patient had a PCN reaction causing immediate rash, facial/tongue/throat swelling, SOB or lightheadedness with hypotension: Yes Has patient had a PCN reaction causing severe rash involving mucus membranes or skin necrosis: Unknown Has patient had a PCN reaction that required hospitalization: Unknown Has patient had a PCN reaction occurring within the last 10 years: No If all of the above answers are "NO", then may proceed with Cephalosporin use.          Objective:  Physical Exam  General: AAO  x3, NAD  Dermatological: The posterior aspect the left heel is a wound measuring 0.6 x 0.6 cm with a depth of 0.1.  There is no probing to bone, undermining tunneling.  There is no surrounding erythema, ascending cellulitis.  No fluctuation crepitation.  No malodor.  I did see a picture from before she started antibiotics and there was surrounding erythema but this appears to be improved.  Preulcerative area to the posterior right heel but no skin breakdown.  Vascular: Dorsalis Pedis artery and Posterior Tibial artery pedal pulses are 1/4 bilateral with immedate capillary fill time. There is no pain with calf compression, swelling, warmth, erythema.   Neruologic: Grossly intact via light touch bilateral.   Musculoskeletal: Tenderness posterior heels     Assessment:   Left posterior heel wound preulcerative right heel    Plan:  -  Treatment options discussed including all alternatives, risks, and complications -Etiology of symptoms were discussed -The wound is improved compared to the picture on the left side.  Recommend mupirocin ointment dressing changes daily which I prescribed.  Offloading pads dispensed.  Offloading of the heels while in bed.  The wound is healed discussed moisturizer.  She has Voltaren that she has been applying to the wound but recommended not to apply to the open wound. -ABI was done to ensure healing.  Left was [phone].  Vivi BarrackMatthew R Jamica Woodyard DPM

## 2019-12-25 ENCOUNTER — Encounter: Payer: Self-pay | Admitting: Family Medicine

## 2019-12-26 ENCOUNTER — Encounter: Payer: Self-pay | Admitting: Family Medicine

## 2019-12-29 ENCOUNTER — Ambulatory Visit (INDEPENDENT_AMBULATORY_CARE_PROVIDER_SITE_OTHER): Payer: Federal, State, Local not specified - PPO | Admitting: Podiatry

## 2019-12-29 ENCOUNTER — Ambulatory Visit: Payer: Federal, State, Local not specified - PPO | Attending: Internal Medicine

## 2019-12-29 ENCOUNTER — Other Ambulatory Visit: Payer: Self-pay

## 2019-12-29 DIAGNOSIS — L84 Corns and callosities: Secondary | ICD-10-CM | POA: Diagnosis not present

## 2019-12-29 DIAGNOSIS — L97401 Non-pressure chronic ulcer of unspecified heel and midfoot limited to breakdown of skin: Secondary | ICD-10-CM

## 2019-12-29 DIAGNOSIS — Z23 Encounter for immunization: Secondary | ICD-10-CM | POA: Diagnosis present

## 2019-12-29 NOTE — Progress Notes (Signed)
   Covid-19 Vaccination Clinic  Name:  Sharon [name]    MRN: [id number] DOB: 04-29-1923  12/29/2019  Sharon [name] was observed post Covid-19 immunization for 15 minutes without incidence. She was provided with Vaccine Information Sheet and instruction to access the V-Safe system.   Sharon [name] was instructed to call 911 with any severe reactions post vaccine: Marland Kitchen Difficulty breathing  . Swelling of your face and throat  . A fast heartbeat  . A bad rash all over your body  . Dizziness and weakness

## 2020-01-02 ENCOUNTER — Other Ambulatory Visit: Payer: Self-pay | Admitting: Family Medicine

## 2020-01-02 ENCOUNTER — Telehealth: Payer: Self-pay | Admitting: Family Medicine

## 2020-01-02 ENCOUNTER — Encounter: Payer: Self-pay | Admitting: Family Medicine

## 2020-01-02 MED ORDER — IPRATROPIUM BROMIDE 0.03 % NA SOLN: 2.0000 | Freq: Three times a day (TID) | NASAL | 5 refills | Status: DC | PRN

## 2020-01-02 NOTE — Telephone Encounter (Signed)
Copied from CRM [id number]. Topic: General - Other >> Jan 02, 2020  3:28 PM Tamela Oddi wrote: Reason for CRM: Called to inform the nurse that patient has had increased coughing, congestion and stuffy nose. No fever.  Please advise and call to discuss at [phone]

## 2020-01-02 NOTE — Telephone Encounter (Signed)
Message Routed to PCP CMA 

## 2020-01-03 ENCOUNTER — Encounter: Payer: Self-pay | Admitting: Family Medicine

## 2020-01-05 NOTE — Progress Notes (Signed)
Subjective: 86 year old female presents the office today for follow-up evaluation of a wound to the back of her left heel.  She still gets discomfort in the backs of both heels.  Her daughter states that started from an area of flaking skin.  She has been applying the mupirocin on the wound on the left side. Denies any systemic complaints such as fevers, chills, nausea, vomiting. No acute changes since last appointment, and no other complaints at this time.   Objective: AAO x3, NAD DP/PT pulses palpable bilaterally, CRT less than 3 seconds The ulceration of the posterior aspect left heel is starting to scab over.  There is no probing, undermining or tunneling but there is tenderness to palpation.  There is no surrounding erythema, ascending cellulitis.  There is no fluctuation or crepitation.  There is no malodor.  Preulcerative area with blanchable erythema to the posterior aspect of the right heel. No open lesions or pre-ulcerative lesions.  No pain with calf compression, swelling, warmth, erythema  Assessment: Preulcerative area right side with ulceration left heel with improvement  Plan: -All treatment options discussed with the patient including all alternatives, risks, complications.  -Continue with mupirocin ointment daily on the left side.  Continue to offload the areas.  She is not able to wear the heel pillows or keep her feet propped up while in bed.  I dispensed Allevyn heel cups to see if this will be helpful.  Ultimately we need to avoid pressure to this area to get these to heal and also help with her discomfort. -Patient encouraged to call the office with any questions, concerns, change in symptoms.   Vivi Barrack DPM

## 2020-01-07 ENCOUNTER — Other Ambulatory Visit: Payer: Self-pay | Admitting: Internal Medicine

## 2020-01-07 DIAGNOSIS — J9611 Chronic respiratory failure with hypoxia: Secondary | ICD-10-CM

## 2020-01-09 ENCOUNTER — Other Ambulatory Visit: Payer: Self-pay | Admitting: Family Medicine

## 2020-01-09 DIAGNOSIS — R0981 Nasal congestion: Secondary | ICD-10-CM

## 2020-01-09 NOTE — Progress Notes (Signed)
r 

## 2020-01-13 ENCOUNTER — Ambulatory Visit: Payer: Medicare Other | Admitting: Podiatry

## 2020-01-19 ENCOUNTER — Ambulatory Visit: Payer: Federal, State, Local not specified - PPO | Attending: Internal Medicine

## 2020-01-19 ENCOUNTER — Ambulatory Visit: Payer: Federal, State, Local not specified - PPO

## 2020-01-19 DIAGNOSIS — Z23 Encounter for immunization: Secondary | ICD-10-CM | POA: Insufficient documentation

## 2020-01-19 NOTE — Progress Notes (Signed)
   Covid-19 Vaccination Clinic  Name:  Sharon [name]    MRN: [id number] DOB: 18-Feb-1923  01/19/2020  [name] was observed post Covid-19 immunization for 15 minutes without incidence. She was provided with Vaccine Information Sheet and instruction to access the V-Safe system.   [name] was instructed to call 911 with any severe reactions post vaccine: Marland Kitchen Difficulty breathing  . Swelling of your face and throat  . A fast heartbeat  . A bad rash all over your body  . Dizziness and weakness    Immunizations Administered    Name Date Dose VIS Date Route   Pfizer COVID-19 Vaccine 01/19/2020 10:38 AM 0.3 mL 11/28/2019 Intramuscular   Manufacturer: [employer], [employer]   Lot: [id number]   NDC: [address]-6

## 2020-01-20 ENCOUNTER — Other Ambulatory Visit: Payer: Self-pay

## 2020-01-20 ENCOUNTER — Ambulatory Visit (INDEPENDENT_AMBULATORY_CARE_PROVIDER_SITE_OTHER): Payer: Federal, State, Local not specified - PPO | Admitting: Otolaryngology

## 2020-01-20 VITALS — Temp 96.4°F

## 2020-01-20 DIAGNOSIS — J310 Chronic rhinitis: Secondary | ICD-10-CM

## 2020-01-20 DIAGNOSIS — J3489 Other specified disorders of nose and nasal sinuses: Secondary | ICD-10-CM | POA: Diagnosis not present

## 2020-01-20 DIAGNOSIS — J342 Deviated nasal septum: Secondary | ICD-10-CM

## 2020-01-20 MED ORDER — TRIAMCINOLONE ACETONIDE 55 MCG/ACT NA AERO: 2.0000 | INHALATION_SPRAY | Freq: Every day | NASAL | 12 refills | Status: DC

## 2020-01-20 NOTE — Progress Notes (Signed)
HPI: Sharon [name] is a 86 y.o. female who presents is referred by her PCP for evaluation of nasal obstruction.  She states that she always has a stuffy nose.  She has used nasal cannula O2 for over a year.  She has had previous nasal surgery with Dr. Haroldine Laws over 10 years ago.  She is referred here because of chronic nasal congestion.. No real pain or pressure in her sinus region.  Past Medical History:  Diagnosis Date  . Anemia   . B12 DEFICIENCY 03/31/2008  . COPD 11/27/2008  . ESOPHAGEAL STRICTURE 01/29/2009  . MASS, LUNG 10/13/2009  . NASAL FRACTURE 08/12/2009  . OSTEOARTHRITIS 03/31/2008   Hips  . OSTEOPOROSIS 12/27/2007  . PEDAL EDEMA 12/27/2007  . Pneumonia 07/10/2016  . PULMONARY NODULE 10/21/2009  . RESPIRATORY FAILURE, CHRONIC 06/29/2010  . Shortness of breath dyspnea    Past Surgical History:  Procedure Laterality Date  . APPENDECTOMY     '38  . BALLOON DILATION N/A 05/10/2015   Procedure: BALLOON DILATION;  Surgeon: Louis Meckel, MD;  Location: [facility];  Service: Endoscopy;  Laterality: N/A;  . BALLOON DILATION N/A 08/02/2015   Procedure: BALLOON DILATION;  Surgeon: Louis Meckel, MD;  Location: [facility];  Service: Endoscopy;  Laterality: N/A;  . BREAST SURGERY     biopsy '47  . CATARACT EXTRACTION Bilateral    last '05  . CHOLECYSTECTOMY     laparoscopic'10  . ESOPHAGOGASTRODUODENOSCOPY     with dilatation x3  . ESOPHAGOGASTRODUODENOSCOPY (EGD) WITH PROPOFOL N/A 05/10/2015   Procedure: ESOPHAGOGASTRODUODENOSCOPY (EGD) WITH PROPOFOL;  Surgeon: Louis Meckel, MD;  Location: [facility];  Service: Endoscopy;  Laterality: N/A;  balloon dilation  . ESOPHAGOGASTRODUODENOSCOPY (EGD) WITH PROPOFOL N/A 08/02/2015   Procedure: ESOPHAGOGASTRODUODENOSCOPY (EGD) WITH PROPOFOL;  Surgeon: Louis Meckel, MD;  Location: [facility];  Service: Endoscopy;  Laterality: N/A;  . LUMBAR LAMINECTOMY    . NASAL RECONSTRUCTION WITH SEPTAL REPAIR     '10  . RECTAL PROLAPSE  REPAIR     '06  . skin graf     '98   Social History   Socioeconomic History  . Marital status: Widowed    Spouse name: Not on file  . Number of children: Not on file  . Years of education: Not on file  . Highest education level: Not on file  Occupational History  . Not on file  Tobacco Use  . Smoking status: Former Smoker    Packs/day: 0.50    Years: 35.00    Pack years: 17.50    Quit date: 12/18/1978    Years since quitting: 41.1  . Smokeless tobacco: Never Used  Substance and Sexual Activity  . Alcohol use: Yes    Alcohol/week: 0.0 standard drinks    Comment: couple of gin and tonics daily  . Drug use: No  . Sexual activity: Not on file  Other Topics Concern  . Not on file  Social History Narrative  . Not on file   Social Determinants of Health   Financial Resource Strain:   . Difficulty of Paying Living Expenses: Not on file  Food Insecurity:   . Worried About Programme researcher, broadcasting/film/video in the Last Year: Not on file  . Ran Out of Food in the Last Year: Not on file  Transportation Needs:   . Lack of Transportation (Medical): Not on file  . Lack of Transportation (Non-Medical): Not on file  Physical Activity:   . Days of Exercise per  Week: Not on file  . Minutes of Exercise per Session: Not on file  Stress:   . Feeling of Stress : Not on file  Social Connections:   . Frequency of Communication with Friends and Family: Not on file  . Frequency of Social Gatherings with Friends and Family: Not on file  . Attends Religious Services: Not on file  . Active Member of Clubs or Organizations: Not on file  . Attends Banker Meetings: Not on file  . Marital Status: Not on file   Family History  Problem Relation Age of Onset  . Hypertension Son   . Asthma Mother   . Abdominal Wall Hernia Mother   . Stomach cancer Mother    Allergies  Allergen Reactions  . Penicillins Rash    Has patient had a PCN reaction causing immediate rash, facial/tongue/throat  swelling, SOB or lightheadedness with hypotension: Yes Has patient had a PCN reaction causing severe rash involving mucus membranes or skin necrosis: Unknown Has patient had a PCN reaction that required hospitalization: Unknown Has patient had a PCN reaction occurring within the last 10 years: No If all of the above answers are "NO", then may proceed with Cephalosporin use.    Prior to Admission medications   Medication Sig Start Date End Date Taking? Authorizing Provider  Acetaminophen (TYLENOL) 325 MG CAPS Take 1 capsule by mouth every 6 (six) hours as needed.   Yes [provider]  albuterol (PROVENTIL) (2.5 MG/3ML) 0.083% nebulizer solution Take 3 mLs (2.5 mg total) by nebulization every 4 (four) hours as needed for wheezing or shortness of breath. 12/31/18  Yes Nyoka Cowden, MD  ALPRAZolam Prudy Feeler) 0.25 MG tablet Take 1 tablet (0.25 mg total) by mouth 2 (two) times daily as needed for anxiety. 07/18/19  Yes Koberlein, Paris Lore, MD  Calcium-Vitamin D-Vitamin K (VIACTIV) 500-500-40 MG-UNT-MCG CHEW Chew by mouth daily.   Yes [provider]  Cholecalciferol 50000 units TABS Take 5,000 Units by mouth.   Yes [provider]  clotrimazole (LOTRIMIN) 1 % cream Apply 1 application topically 2 (two) times daily. 07/18/19  Yes Koberlein, Jannette Spanner C, MD  cyanocobalamin (,VITAMIN B-12,) 1000 MCG/ML injection Inject 74ml monthly into muscle 07/18/19  Yes Koberlein, Junell C, MD  dextromethorphan-guaiFENesin (MUCINEX DM) 30-600 MG 12hr tablet Take 1 tablet by mouth 2 (two) times daily as needed (cough/congestion).   Yes [provider]  diclofenac sodium (VOLTAREN) 1 % GEL Apply 2 g topically 4 (four) times daily. 08/12/19  Yes Burchette, Elberta Fortis, MD  escitalopram (LEXAPRO) 5 MG tablet Take 1 tablet (5 mg total) by mouth daily. 06/23/19  Yes Wynn Banker, MD  famotidine (PEPCID) 20 MG tablet One at bedtime 12/24/17  Yes Nyoka Cowden, MD  furosemide (LASIX) 20 MG tablet  TAKE 1 TABLET(20 MG) BY MOUTH DAILY AS NEEDED FOR SWELLING 07/22/19  Yes Koberlein, Junell C, MD  HYDROcodone-acetaminophen (HYCET) 7.5-325 mg/15 ml solution Take 5-10 mLs by mouth 2 (two) times daily as needed for severe pain. 09/03/19  Yes Koberlein, Junell C, MD  ipratropium (ATROVENT) 0.03 % nasal spray Place 2 sprays into both nostrils 3 (three) times daily as needed for rhinitis. 01/02/20  Yes Koberlein, Junell C, MD  lidocaine (LIDODERM) 5 % Place 1 patch onto the skin daily. Remove & Discard patch within 12 hours or as directed by MD 08/26/19  Yes Henderly, Britni A, PA-C  mirtazapine (REMERON) 15 MG tablet Take 1 tablet (15 mg total) by mouth at  bedtime. 07/18/19  Yes Koberlein, Steele Berg, MD  mometasone-formoterol (DULERA) 200-5 MCG/ACT AERO Take 2 puffs first thing in am and then another 2 puffs about 12 hours later. 12/31/18  Yes Tanda Rockers, MD  mupirocin ointment (BACTROBAN) 2 % Apply 1 application topically 2 (two) times daily. 12/16/19  Yes Trula Slade, DPM  OXYGEN Oxygen 3lpm 24/7   Yes [provider]  pantoprazole (PROTONIX) 40 MG tablet Take 1 tablet (40 mg total) by mouth daily. Take 30-60 min before first meal of the day 07/21/19  Yes Tanda Rockers, MD  Polyethyl Glycol-Propyl Glycol (SYSTANE OP) Place 1 drop into both eyes daily.    Yes [provider]  polyethylene glycol powder (GLYCOLAX/MIRALAX) 17 GM/SCOOP powder  12/17/19  Yes [provider]  senna-docusate (SENOKOT-S) 8.6-50 MG tablet Take 1 tablet by mouth 2 (two) times daily as needed for mild constipation. 09/30/18  Yes Koberlein, Steele Berg, MD  Tiotropium Bromide Monohydrate (SPIRIVA RESPIMAT) 2.5 MCG/ACT AERS Inhale 2 puffs into the lungs daily. 07/05/19  Yes Tanda Rockers, MD  triamcinolone ointment (KENALOG) 0.1 % Apply 1 application topically 2 (two) times daily. 07/18/19  Yes Koberlein, Junell C, MD     Positive ROS: Otherwise negative  All other systems have been reviewed and were  otherwise negative with the exception of those mentioned in the HPI and as above.  Physical Exam: Constitutional: Alert, well-appearing, no acute distress Ears: External ears without lesions or tenderness. Ear canals are clear bilaterally with intact, clear TMs.  Nasal: External nose without lesions. Septum slightly deviated to the left..  She has some crusting and scabbing along the O2 nasal cannula which was cleaned and removed in the office. On nasal endoscopy nasal passages were mostly clear bilaterally with only clear mucus discharge.  No polyps noted.  Posterior nasal cavity was clear.  Mild rhinitis. Oral: Lips and gums without lesions. Tongue and palate mucosa without lesions. Posterior oropharynx clear. Neck: No palpable adenopathy or masses Respiratory: Breathing comfortably  Skin: No facial/neck lesions or rash noted.  Nasal/sinus endoscopy  Date/Time: 01/20/2020 5:36 PM Performed by: Rozetta Nunnery, MD Authorized by: Rozetta Nunnery, MD   Consent:    Consent obtained:  Verbal   Consent given by:  Patient   Risks discussed:  Pain Procedure details:    Indications: sino-nasal symptoms     Medication:  Afrin   Instrument: flexible fiberoptic nasal endoscope     Scope location: bilateral nare   Septum:    Deviation: deviated to the right and anterior deviation     Severity of deviation: mild   Sinus:    Right middle meatus: normal     Left middle meatus: normal     Right nasopharynx: normal     Left nasopharynx: normal   Comments:     On nasal endoscopy both nasal passages were clear with no obstructing lesions noted.  Mild septal deformity to the left with narrowed left nasal nostril compared to the right.    Assessment: Chronic rhinitis. Mild septal deformity to the left.  Plan: Would recommend regular use of nasal steroid spray Nasacort 2 sprays each nostril at night and prescribed this. Also suggested using saline irrigation to clean the nostrils  as needed during the day. Also suggested trying Breathe Right strips and reviewed this with the daughter-in-law as this will help open the nostrils bilaterally.   Radene Journey, MD   CC:

## 2020-01-22 ENCOUNTER — Telehealth: Payer: Self-pay | Admitting: Family Medicine

## 2020-01-22 NOTE — Telephone Encounter (Signed)
Pt's Hospice nurse, Littie Deeds, would like to inform you that pt's right lower leg looks like it can possibly be infection. Thanks  [name]'s # [phone]

## 2020-01-22 NOTE — Telephone Encounter (Signed)
Left a message for Sharon [name] to return my call.  I called the pt, spoke with her caregiver and scheduled an appt for tomorrow to arrive at 9:45am to see Dr Caryl Never as Dr Hassan Rowan does not have any openings.

## 2020-01-22 NOTE — Telephone Encounter (Signed)
[name] called back and stated the patient bumped the right lower extremity on a wire after her leg was caught up in tubing over 2 weeks ago.  [name] stated there is redness and swelling present and the area is tender to the touch.  I informed [name] the pt was scheduled for an appt tomorrow morning with Dr Caryl Never and she stated she feels the patient can await the appt.  Message sent to Dr Caryl Never.

## 2020-01-23 ENCOUNTER — Ambulatory Visit (INDEPENDENT_AMBULATORY_CARE_PROVIDER_SITE_OTHER): Payer: Federal, State, Local not specified - PPO | Admitting: Family Medicine

## 2020-01-23 ENCOUNTER — Encounter: Payer: Self-pay | Admitting: Family Medicine

## 2020-01-23 ENCOUNTER — Other Ambulatory Visit: Payer: Self-pay

## 2020-01-23 VITALS — BP 120/68 | HR 76 | Temp 97.3°F | Wt 113.1 lb

## 2020-01-23 DIAGNOSIS — L03115 Cellulitis of right lower limb: Secondary | ICD-10-CM

## 2020-01-23 MED ORDER — CEPHALEXIN 250 MG/5ML PO SUSR: ORAL | 0 refills | Status: DC

## 2020-01-23 NOTE — Progress Notes (Signed)
Subjective:     Patient ID: Sharon [name], female   DOB: [birth year], 86 y.o.   MRN: [id number]  HPI   Sharon [name] seen as a work in with recent injury right anterior leg.  She states that her oxygen tubing got tangled around her leg and basically abraded the skin.  They think this happened about 3 weeks ago.  She did not actually fall.  She initially described a fairly large blister which eventually ruptured.  She now has a large eschar.  About a week ago they noticed some increased swelling and redness.  She is not had any fevers or chills.  They have been cleaning this occasion with soap and water.  Not applying anything else topically to the wound.  She does have very sensitive skin in general.  She has chronic left heel ulcer that has been going on for about a year now.  They are using heel pads for that.  Sharon [name] apparently has COPD and is on chronic oxygen.  Her other medical problems include esophageal stricture, osteoporosis, history of recurrent depression.  She states she cannot swallow most pills.  Past Medical History:  Diagnosis Date  . Anemia   . B12 DEFICIENCY 03/31/2008  . COPD 11/27/2008  . ESOPHAGEAL STRICTURE 01/29/2009  . MASS, LUNG 10/13/2009  . NASAL FRACTURE 08/12/2009  . OSTEOARTHRITIS 03/31/2008   Hips  . OSTEOPOROSIS 12/27/2007  . PEDAL EDEMA 12/27/2007  . Pneumonia 07/10/2016  . PULMONARY NODULE 10/21/2009  . RESPIRATORY FAILURE, CHRONIC 06/29/2010  . Shortness of breath dyspnea    Past Surgical History:  Procedure Laterality Date  . APPENDECTOMY     '38  . BALLOON DILATION N/A 05/10/2015   Procedure: BALLOON DILATION;  Surgeon: Inda Castle, MD;  Location: [facility];  Service: Endoscopy;  Laterality: N/A;  . BALLOON DILATION N/A 08/02/2015   Procedure: BALLOON DILATION;  Surgeon: Inda Castle, MD;  Location: [facility];  Service: Endoscopy;  Laterality: N/A;  . BREAST SURGERY     biopsy '47  . CATARACT EXTRACTION Bilateral    last '05  .  CHOLECYSTECTOMY     laparoscopic'10  . ESOPHAGOGASTRODUODENOSCOPY     with dilatation x3  . ESOPHAGOGASTRODUODENOSCOPY (EGD) WITH PROPOFOL N/A 05/10/2015   Procedure: ESOPHAGOGASTRODUODENOSCOPY (EGD) WITH PROPOFOL;  Surgeon: Inda Castle, MD;  Location: [facility];  Service: Endoscopy;  Laterality: N/A;  balloon dilation  . ESOPHAGOGASTRODUODENOSCOPY (EGD) WITH PROPOFOL N/A 08/02/2015   Procedure: ESOPHAGOGASTRODUODENOSCOPY (EGD) WITH PROPOFOL;  Surgeon: Inda Castle, MD;  Location: [facility];  Service: Endoscopy;  Laterality: N/A;  . LUMBAR LAMINECTOMY    . NASAL RECONSTRUCTION WITH SEPTAL REPAIR     '10  . RECTAL PROLAPSE REPAIR     '06  . skin graf     '98    reports that she quit smoking about 41 years ago. She has a 17.50 pack-year smoking history. She has never used smokeless tobacco. She reports current alcohol use. She reports that she does not use drugs. family history includes Abdominal Wall Hernia in her mother; Asthma in her mother; Hypertension in her son; Stomach cancer in her mother. Allergies  Allergen Reactions  . Penicillins Rash    Has patient had a PCN reaction causing immediate rash, facial/tongue/throat swelling, SOB or lightheadedness with hypotension: Yes Has patient had a PCN reaction causing severe rash involving mucus membranes or skin necrosis: Unknown Has patient had a PCN reaction that required hospitalization: Unknown Has patient had a PCN reaction  occurring within the last 10 years: No If all of the above answers are "NO", then may proceed with Cephalosporin use.      Review of Systems  Constitutional: Negative for chills and fever.  Gastrointestinal: Negative for nausea and vomiting.  Psychiatric/Behavioral: Negative for confusion.       Objective:   Physical Exam Vitals reviewed.  Constitutional:      Appearance: Normal appearance.  Cardiovascular:     Rate and Rhythm: Normal rate.  Pulmonary:     Effort: Pulmonary effort is  normal.     Breath sounds: Normal breath sounds.  Skin:    Comments: She has eschar right anterior leg which is about 3 cm in length and about 1 and half centimeters in width.  She does have some surrounding erythema.  No significant warmth.  Leg is tender to palpation.  She has erythema extending down to the ankle region.  No purulent secretions  Neurological:     Mental Status: She is alert.        Assessment:     Cellulitis right leg following recent injury as above.    Plan:     -Recommend frequent elevation of the leg -Start Keflex 250 mg per 5 mL 2 teaspoons 3 times daily for 10 days -Clean gently with soap and water daily -May have some mupirocin ointment we suggested using that once or twice daily to the wound -Recommend follow-up with primary in 2 weeks to reassess  Kristian Covey MD [facility]

## 2020-01-23 NOTE — Patient Instructions (Signed)

## 2020-01-26 ENCOUNTER — Other Ambulatory Visit: Payer: Self-pay | Admitting: Internal Medicine

## 2020-01-28 ENCOUNTER — Other Ambulatory Visit: Payer: Self-pay | Admitting: Family Medicine

## 2020-01-28 ENCOUNTER — Telehealth: Payer: Self-pay | Admitting: *Deleted

## 2020-01-28 DIAGNOSIS — J449 Chronic obstructive pulmonary disease, unspecified: Secondary | ICD-10-CM

## 2020-01-28 MED ORDER — PANTOPRAZOLE SODIUM 40 MG PO TBEC: 40.0000 mg | DELAYED_RELEASE_TABLET | Freq: Every day | ORAL | 3 refills | Status: DC

## 2020-01-28 NOTE — Telephone Encounter (Signed)
I called [facility], spoke with Narges the pharmacist and she stated the Rx for Mucinex DM max liquid was already on file, refilled and was picked yesterday.

## 2020-01-28 NOTE — Telephone Encounter (Signed)
Patient care giver called after hours line. Per caregiver pharmacy called PCP office this morning. Trying to fill Pantoprazole 40mg  1 tab daily, Mucinex DM max liquid 20mg  oral every 4 hours as needed, caller states has a couple of the Pantoprazole left and is totally out of the Mucinex. Pharmacy [facility] on [address] [phone]

## 2020-01-28 NOTE — Telephone Encounter (Signed)
I sent pantoprazole, but I can't find the mucinex DM in our system in liquid? Can you just call in refill to pharmacy for the mucinex if she has been getting there?

## 2020-02-01 ENCOUNTER — Other Ambulatory Visit: Payer: Self-pay | Admitting: Family Medicine

## 2020-02-05 ENCOUNTER — Ambulatory Visit: Payer: Federal, State, Local not specified - PPO | Admitting: Podiatry

## 2020-02-06 ENCOUNTER — Other Ambulatory Visit: Payer: Self-pay

## 2020-02-06 ENCOUNTER — Encounter: Payer: Self-pay | Admitting: Family Medicine

## 2020-02-06 ENCOUNTER — Ambulatory Visit (INDEPENDENT_AMBULATORY_CARE_PROVIDER_SITE_OTHER): Payer: Medicare Other | Admitting: Family Medicine

## 2020-02-06 VITALS — HR 88 | Temp 96.5°F

## 2020-02-06 DIAGNOSIS — J9611 Chronic respiratory failure with hypoxia: Secondary | ICD-10-CM

## 2020-02-06 DIAGNOSIS — J9612 Chronic respiratory failure with hypercapnia: Secondary | ICD-10-CM | POA: Diagnosis not present

## 2020-02-06 DIAGNOSIS — L03115 Cellulitis of right lower limb: Secondary | ICD-10-CM | POA: Diagnosis not present

## 2020-02-06 MED ORDER — TRIAMCINOLONE ACETONIDE 55 MCG/ACT NA AERO: 2.0000 | INHALATION_SPRAY | Freq: Every day | NASAL | 12 refills | Status: DC

## 2020-02-06 MED ORDER — MUCINEX FAST-MAX DM MAX 20-400 MG/20ML PO LIQD: 20.0000 mL | Freq: Four times a day (QID) | ORAL | 12 refills | Status: DC | PRN

## 2020-02-06 NOTE — Progress Notes (Signed)
Sharon [name] DOB: 01-17-1923 Encounter date: 02/06/2020  This is a 86 y.o. female who presents with Chief Complaint  Patient presents with  . Follow-up    History of present illness: Saw Dr. Caryl Never 2/5 for cellulitis right leg. Started on keflex; advised use mupirocin ointment.   Still has a lot of pain on heel.   Still a knot on both sides of right leg - but pain has improved. Swells and more red at night, but this has improved too.   Can't elevate legs during day because uncomfortable.  Extremely limited mobility secondary to severe lung disease.  Really only up with assistance to use the restroom during the day.  Sits with legs in dependent positioning through the day.  Breathing has been about the same.  She has episodes of coughing.  She does get relief from liquid Mucinex.  Sputum does seem to be darker green than it was in the past, but has been this way for some time.  She does get relief of breathing with oxygen.  She is very frustrated by the need for oxygen and limitations that her respiratory condition causes.  We had referred her recently to ear nose and throat since she does have some significant nasal congestion.  She does continue to use nasal saline.  She did not feel that the Breathe Right strips were helpful for improvement in airflow at all.   Allergies  Allergen Reactions  . Penicillins Rash    Has patient had a PCN reaction causing immediate rash, facial/tongue/throat swelling, SOB or lightheadedness with hypotension: Yes Has patient had a PCN reaction causing severe rash involving mucus membranes or skin necrosis: Unknown Has patient had a PCN reaction that required hospitalization: Unknown Has patient had a PCN reaction occurring within the last 10 years: No If all of the above answers are "NO", then may proceed with Cephalosporin use.    Current Meds  Medication Sig  . Acetaminophen (TYLENOL) 325 MG CAPS Take 1 capsule by mouth every 6 (six) hours as  needed.  Marland Kitchen albuterol (PROVENTIL) (2.5 MG/3ML) 0.083% nebulizer solution Take 3 mLs (2.5 mg total) by nebulization every 4 (four) hours as needed for wheezing or shortness of breath.  . ALPRAZolam (XANAX) 0.25 MG tablet Take 1 tablet (0.25 mg total) by mouth 2 (two) times daily as needed for anxiety.  . Cholecalciferol 50000 units TABS Take 5,000 Units by mouth.  . cyanocobalamin (,VITAMIN B-12,) 1000 MCG/ML injection Inject 5ml monthly into muscle  . Dextromethorphan-guaiFENesin (MUCINEX FAST-MAX DM MAX) 5-100 MG/5ML LIQD Take 20 mLs by mouth 4 (four) times daily as needed (cough, congestion).  Marland Kitchen escitalopram (LEXAPRO) 5 MG tablet Take 1 tablet (5 mg total) by mouth daily.  . famotidine (PEPCID) 20 MG tablet One at bedtime  . furosemide (LASIX) 20 MG tablet TAKE 1 TABLET(20 MG) BY MOUTH DAILY AS NEEDED FOR SWELLING  . HYDROcodone-acetaminophen (HYCET) 7.5-325 mg/15 ml solution Take 5-10 mLs by mouth 2 (two) times daily as needed for severe pain.  Marland Kitchen ipratropium (ATROVENT) 0.03 % nasal spray Place 2 sprays into both nostrils 3 (three) times daily as needed for rhinitis.  . mirtazapine (REMERON) 15 MG tablet Take 1 tablet (15 mg total) by mouth at bedtime.  . mometasone-formoterol (DULERA) 200-5 MCG/ACT AERO INHALE 2 PUFFS BY MOUTH FIRST THING IN MORNING AND THEN ANOTHER 2 PUFFS ABOUT 12 HOURS LATER  . mupirocin ointment (BACTROBAN) 2 % Apply 1 application topically 2 (two) times daily.  . OXYGEN Oxygen 3lpm  24/7  . pantoprazole (PROTONIX) 40 MG tablet Take 1 tablet (40 mg total) by mouth daily. Take 30-60 min before first meal of the day  . Polyethyl Glycol-Propyl Glycol (SYSTANE OP) Place 1 drop into both eyes daily.   . polyethylene glycol powder (GLYCOLAX/MIRALAX) 17 GM/SCOOP powder   . senna-docusate (SENOKOT-S) 8.6-50 MG tablet Take 1 tablet by mouth 2 (two) times daily as needed for mild constipation.  . Tiotropium Bromide Monohydrate (SPIRIVA RESPIMAT) 2.5 MCG/ACT AERS Inhale 2 puffs into the  lungs daily.  Marland Kitchen triamcinolone (NASACORT) 55 MCG/ACT AERO nasal inhaler Place 2 sprays into the nose daily.  . [DISCONTINUED] Calcium-Vitamin D-Vitamin K (VIACTIV) 500-500-40 MG-UNT-MCG CHEW Chew by mouth daily.  . [DISCONTINUED] clotrimazole (LOTRIMIN) 1 % cream Apply 1 application topically 2 (two) times daily.  . [DISCONTINUED] dextromethorphan-guaiFENesin (MUCINEX DM) 30-600 MG 12hr tablet Take 1 tablet by mouth 2 (two) times daily as needed (cough/congestion).  . [DISCONTINUED] diclofenac sodium (VOLTAREN) 1 % GEL Apply 2 g topically 4 (four) times daily.  . [DISCONTINUED] lidocaine (LIDODERM) 5 % Place 1 patch onto the skin daily. Remove & Discard patch within 12 hours or as directed by MD  . [DISCONTINUED] MUCINEX FAST-MAX DM MAX 20-400 MG/20ML LIQD TAKE AS DIRECTED  . [DISCONTINUED] triamcinolone (NASACORT) 55 MCG/ACT AERO nasal inhaler Place 2 sprays into the nose daily.  . [DISCONTINUED] triamcinolone ointment (KENALOG) 0.1 % Apply 1 application topically 2 (two) times daily.    Review of Systems  Constitutional: Negative for chills, fatigue and fever.  Respiratory: Negative for cough, chest tightness, shortness of breath and wheezing.   Cardiovascular: Negative for chest pain, palpitations and leg swelling.  Skin:       Has had improvement in skin color.  No longer red around area of irritation.  Area of dark/bloody blister has decreased in size.  Edema has improved overall.  Tenderness is improved.    Objective:  Pulse 88   Temp (!) 96.5 F (35.8 C) (Temporal)   SpO2 97%     Medical assistant was unable to hear diastolic pressure, but systolic pressure was 118.  Patient does check daily pressures at home and has been running in the 120-130/60-80 range.  Blood pressure cuff causes pain in the arm so we will not repeat this today.  BP Readings from Last 3 Encounters:  01/23/20 120/68  12/16/19 132/77  09/01/19 130/80   Wt Readings from Last 3 Encounters:  01/23/20 113 lb 1.6  oz (51.3 kg)  08/26/19 110 lb (49.9 kg)  07/25/19 110 lb 3.2 oz (50 kg)    Physical Exam Constitutional:      General: She is not in acute distress.    Appearance: She is well-developed.  Cardiovascular:     Rate and Rhythm: Normal rate and regular rhythm.     Heart sounds: Normal heart sounds. No murmur. No friction rub.  Pulmonary:     Effort: Pulmonary effort is normal. No respiratory distress.     Breath sounds: Decreased air movement present. Decreased breath sounds present. No wheezing, rhonchi or rales.     Comments: Her breathing seems actually somewhat improved today.  Usual tachypnea seems better, she is not coughing as much and is able to speak in longer sentences than normal. Musculoskeletal:     Right lower leg: No edema (trace).     Left lower leg: No edema (trace).  Skin:    Comments: There is no longer any erythema to the right lower extremity.  There are  2 areas of eschar, but these are decreasing in size.  There is some slight medial and lateral edema to the calf, but this is not concerning.  There is also a seborrheic keratosis medial and inferior to the site of previous cellulitis, but this is not irritated and not of concern.  Neurological:     Mental Status: She is alert and oriented to person, place, and time.  Psychiatric:        Behavior: Behavior normal.     Assessment/Plan  1. Cellulitis of right leg Significant improvement after antibiotic treatment.  No further antibiotic treatment is needed.  We discussed continuing to use heel cups to help decrease risk of skin ulceration when in bed.  This has helped significantly.  She is unable to elevate legs during the day, but edema seems to be stable at this time.  Monitor and let us know if any worsening.  2. Chronic respiratory failure with hypoxia and hypercapnia (HCC) Stable, although slow and gradual decline in recent years.  She does follow with pulmonology.  She was previously admitted to hospice, but  because she was doing well has been released from hospice.  Mucinex is helpful for symptomatic treatment.  Larger supply of this was sent into the pharmacy for her.   Return in about 3 months (around 05/05/2020) for Chronic condition visit.    Micheline Rough, MD

## 2020-02-11 ENCOUNTER — Ambulatory Visit: Payer: Federal, State, Local not specified - PPO | Admitting: Family Medicine

## 2020-03-26 ENCOUNTER — Telehealth: Payer: Self-pay | Admitting: Family Medicine

## 2020-03-26 NOTE — Telephone Encounter (Signed)
[name] from [facility] called to see if she can e-mail a picture of a rash on the patient right lower leg.  I advised her that the patient has MyChart that she can send the picture through a message in her portal. [name] is going to try and sent a picture of the patients rash.   Please Advise  Claris Gower [facility] [phone]

## 2020-03-28 ENCOUNTER — Other Ambulatory Visit: Payer: Self-pay | Admitting: Family Medicine

## 2020-03-29 ENCOUNTER — Encounter: Payer: Self-pay | Admitting: Family Medicine

## 2020-03-29 NOTE — Telephone Encounter (Signed)
I never got a message/picture? I will send a mychart message to patient

## 2020-03-29 NOTE — Telephone Encounter (Signed)
Spoke with [name] and informed her of the message below.  °

## 2020-03-30 ENCOUNTER — Other Ambulatory Visit: Payer: Self-pay | Admitting: Family Medicine

## 2020-03-30 DIAGNOSIS — F419 Anxiety disorder, unspecified: Secondary | ICD-10-CM

## 2020-03-30 MED ORDER — ALPRAZOLAM 0.25 MG PO TABS: 0.2500 mg | ORAL_TABLET | Freq: Two times a day (BID) | ORAL | 2 refills | Status: DC | PRN

## 2020-03-31 NOTE — Telephone Encounter (Signed)
Sharon [name] from [facility] said they tried several times to send an image and unable to get it to go through. Sharon [name] stated the rash is itchy,red, raised bumps, and some scabs. She is wondering if the PCP can send something for Triamcinolone cream?  Pharmacy: [facility] [address] FAX: [phone]  Sharon [name] can be reached at [phone] -ok to leave a detailed message per Sharon [name]

## 2020-04-01 ENCOUNTER — Other Ambulatory Visit: Payer: Self-pay | Admitting: Family Medicine

## 2020-04-01 MED ORDER — TRIAMCINOLONE ACETONIDE 0.5 % EX OINT: 1.0000 "application " | TOPICAL_OINTMENT | Freq: Two times a day (BID) | CUTANEOUS | 0 refills | Status: DC

## 2020-04-01 NOTE — Telephone Encounter (Signed)
I sent in triamcinolone for her, but since she has had cellulitis in recent months; I am happy to do a virtual visit with them so that I can see the rash if they feel necessary or if they aren't noting improvement with the cream.

## 2020-04-01 NOTE — Telephone Encounter (Signed)
Spoke with [name] and informed her of the message below.  °

## 2020-04-02 ENCOUNTER — Other Ambulatory Visit: Payer: Self-pay | Admitting: Internal Medicine

## 2020-04-02 MED ORDER — ALBUTEROL SULFATE (2.5 MG/3ML) 0.083% IN NEBU: 2.5000 mg | INHALATION_SOLUTION | RESPIRATORY_TRACT | 0 refills | Status: DC | PRN

## 2020-04-15 ENCOUNTER — Telehealth: Payer: Self-pay | Admitting: Family Medicine

## 2020-04-15 NOTE — Telephone Encounter (Signed)
Steward Drone from [name] called to see if you have received an order for [employer].  [name]  [phone]

## 2020-04-15 NOTE — Telephone Encounter (Signed)
Spoke with Steward Drone and she stated she needs to have the certification order from 3/15.  I advised Steward Drone I have not seen this and asked that she fax to our office.

## 2020-04-16 ENCOUNTER — Telehealth: Payer: Self-pay | Admitting: Family Medicine

## 2020-04-16 NOTE — Telephone Encounter (Signed)
[name] nurse from [facility] wanted to let provider know that pt has rhonchi in lungs also bad coughing with no fever.    She also encourges pt to continue nebulizer treatments.

## 2020-04-16 NOTE — Telephone Encounter (Signed)
Noted. Please have family/patient let me know if they feel that this is change from baseline. She keeps congestion, shortness of breath, and cough. If more productive of sputum or feeling worse let me know (or pulmonology)

## 2020-04-16 NOTE — Telephone Encounter (Signed)
Spoke with [name] and informed her of the message below.  °

## 2020-04-20 NOTE — Telephone Encounter (Signed)
Forms signed.

## 2020-04-22 ENCOUNTER — Telehealth: Payer: Self-pay | Admitting: Family Medicine

## 2020-04-22 NOTE — Telephone Encounter (Signed)
Patient has been depressed lately, feeling blue, anxious.  Wanting to know if they can increase or change medication  escitalopram (LEXAPRO) 5 MG tablet   Philis Fendt [phone]

## 2020-04-23 NOTE — Telephone Encounter (Signed)
Ok to increase to 10mg  (2 tabs) daily and then please set up follow up to discuss (phone/virtual is ok) in 2 weeks.

## 2020-04-23 NOTE — Telephone Encounter (Signed)
Spoke with [name] and informed her of the message below.  Sharon [name] to remind the pt to keep the virtual visit on 5/21 that was previously scheduled and she agreed.

## 2020-05-06 ENCOUNTER — Other Ambulatory Visit: Payer: Self-pay

## 2020-05-07 ENCOUNTER — Ambulatory Visit (INDEPENDENT_AMBULATORY_CARE_PROVIDER_SITE_OTHER): Payer: Federal, State, Local not specified - PPO | Admitting: Family Medicine

## 2020-05-07 ENCOUNTER — Encounter: Payer: Self-pay | Admitting: Family Medicine

## 2020-05-07 VITALS — HR 60 | Temp 97.2°F | Ht 66.0 in

## 2020-05-07 DIAGNOSIS — R600 Localized edema: Secondary | ICD-10-CM | POA: Diagnosis not present

## 2020-05-07 DIAGNOSIS — M810 Age-related osteoporosis without current pathological fracture: Secondary | ICD-10-CM | POA: Diagnosis not present

## 2020-05-07 DIAGNOSIS — R5383 Other fatigue: Secondary | ICD-10-CM

## 2020-05-07 DIAGNOSIS — D649 Anemia, unspecified: Secondary | ICD-10-CM

## 2020-05-07 DIAGNOSIS — E538 Deficiency of other specified B group vitamins: Secondary | ICD-10-CM | POA: Diagnosis not present

## 2020-05-07 DIAGNOSIS — J9611 Chronic respiratory failure with hypoxia: Secondary | ICD-10-CM | POA: Diagnosis not present

## 2020-05-07 DIAGNOSIS — K5900 Constipation, unspecified: Secondary | ICD-10-CM

## 2020-05-07 DIAGNOSIS — F419 Anxiety disorder, unspecified: Secondary | ICD-10-CM

## 2020-05-07 MED ORDER — ESCITALOPRAM OXALATE 5 MG PO TABS: 5.0000 mg | ORAL_TABLET | Freq: Every day | ORAL | 1 refills | Status: DC

## 2020-05-07 NOTE — Progress Notes (Signed)
Sharon [name] DOB: [birth year] Encounter date: 05/07/2020  This is a 86 y.o. female who presents with Chief Complaint  Patient presents with  . Follow-up    History of present illness: At last visit in February, we had discussed cellulitis which she has been seen for previously.  She was treated with antibiotics and had good improvement.  Now having some discomfort on her heels bilaterally.  It is better today than it has been.  She does not like having anything on her heels including shoes, heel cups.  Difficult to position her in bed or in her chair without having pressure on the heels.  Chronic respiratory failure:need for oxygen is greater. Needing up to 6L of oxygen with exertion in the morning. 6L is normal now with exertion. Not moving much at all.   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  She is working full days (8 to 10 hours) on this.  Mood has been up and down.  It was a little more down, but became slightly elevated after her birthday celebration, and family being in town.  They did increase the Lexapro to 10 mg for a little while, and it seemed to potentially help, but she seems to be doing a little bit better on the 5 mg now.  Has issues with regulating bowels.  Was taking MiraLAX on a daily basis to help with constipation, but then started having loose stools that were very frequent.  Backed off the MiraLAX for a few days, but then had more constipation.  Hard to find a happy medium.  She has bad about drinking water and typically drinks just 1 small glass of juice throughout the whole day.  She does not take any fiber supplements.  Allergies  Allergen Reactions  . Penicillins Rash    Has patient had a PCN reaction causing immediate rash, facial/tongue/throat swelling, SOB or lightheadedness with hypotension: Yes Has patient had a PCN reaction causing severe rash involving mucus membranes or skin necrosis: Unknown Has patient had a PCN  reaction that required hospitalization: Unknown Has patient had a PCN reaction occurring within the last 10 years: No If all of the above answers are "NO", then may proceed with Cephalosporin use.    Current Meds  Medication Sig  . albuterol (PROVENTIL) (2.5 MG/3ML) 0.083% nebulizer solution Take 3 mLs (2.5 mg total) by nebulization every 4 (four) hours as needed for wheezing or shortness of breath.  . ALPRAZolam (XANAX) 0.25 MG tablet Take 1 tablet (0.25 mg total) by mouth 2 (two) times daily as needed for anxiety.  . Cholecalciferol 50000 units TABS Take 5,000 Units by mouth.  . cyanocobalamin (,VITAMIN B-12,) 1000 MCG/ML injection Inject 58ml monthly into muscle  . Dextromethorphan-guaiFENesin (MUCINEX FAST-MAX DM MAX) 5-100 MG/5ML LIQD Take 20 mLs by mouth 4 (four) times daily as needed (cough, congestion).  Marland Kitchen escitalopram (LEXAPRO) 5 MG tablet Take 1 tablet (5 mg total) by mouth daily.  . famotidine (PEPCID) 20 MG tablet One at bedtime  . HYDROcodone-acetaminophen (HYCET) 7.5-325 mg/15 ml solution Take 5-10 mLs by mouth 2 (two) times daily as needed for severe pain.  . mirtazapine (REMERON) 15 MG tablet Take 1 tablet (15 mg total) by mouth at bedtime.  . mometasone-formoterol (DULERA) 200-5 MCG/ACT AERO INHALE 2 PUFFS BY MOUTH FIRST THING IN MORNING AND THEN ANOTHER 2 PUFFS ABOUT 12 HOURS LATER  . mupirocin ointment (BACTROBAN) 2 % Apply 1 application topically 2 (two) times daily.  Marland Kitchen  OXYGEN Oxygen 3lpm 24/7  . pantoprazole (PROTONIX) 40 MG tablet Take 1 tablet (40 mg total) by mouth daily. Take 30-60 min before first meal of the day  . Polyethyl Glycol-Propyl Glycol (SYSTANE OP) Place 1 drop into both eyes daily.   . polyethylene glycol powder (GLYCOLAX/MIRALAX) 17 GM/SCOOP powder DISSOLVE 17 GRAMS(1 SCOOP) IN 6-8 OZ OF FLUID AND DRINK BY MOUTH ONCE DAILY AS NEEDED FOR CONSTIPATION  . Tiotropium Bromide Monohydrate (SPIRIVA RESPIMAT) 2.5 MCG/ACT AERS Inhale 2 puffs into the lungs daily.   Marland Kitchen triamcinolone (NASACORT) 55 MCG/ACT AERO nasal inhaler Place 2 sprays into the nose daily.  . [DISCONTINUED] triamcinolone ointment (KENALOG) 0.5 % Apply 1 application topically 2 (two) times daily.    Review of Systems  Constitutional: Positive for activity change (decreased) and fatigue. Negative for chills and fever.  Respiratory: Positive for cough, shortness of breath and wheezing. Negative for chest tightness.   Cardiovascular: Positive for leg swelling. Negative for chest pain and palpitations.  Gastrointestinal: Positive for constipation and diarrhea. Negative for abdominal pain.  Skin: Positive for rash.  Psychiatric/Behavioral: Positive for sleep disturbance. The patient is nervous/anxious.     Objective:  Pulse 60   Temp (!) 97.2 F (36.2 C) (Temporal)   Ht 5\' 6"  (1.676 m)   SpO2 96%   BMI 18.25 kg/m       BP Readings from Last 3 Encounters:  01/23/20 120/68  12/16/19 132/77  09/01/19 130/80   Wt Readings from Last 3 Encounters:  01/23/20 113 lb 1.6 oz (51.3 kg)  08/26/19 110 lb (49.9 kg)  07/25/19 110 lb 3.2 oz (50 kg)    Physical Exam Constitutional:      General: She is not in acute distress.    Appearance: She is well-developed. She is cachectic.  Cardiovascular:     Rate and Rhythm: Normal rate and regular rhythm.     Heart sounds: Normal heart sounds. No murmur. No friction rub.  Pulmonary:     Effort: Pulmonary effort is normal. No respiratory distress.     Breath sounds: Decreased breath sounds present. No wheezing, rhonchi or rales.     Comments: Although lung sounds are slightly decreased bilaterally, lungs sound more clear than they typically do. Musculoskeletal:     Right lower leg: No edema.     Left lower leg: No edema.  Skin:    Comments: There is erythema bilateral heels, but no skin breakdown.  There is 1 area of callus that appears to be resolving on the left heel.  There is a small half centimeter scab posterior left calf, but no  surrounding erythema or irritation.  Rash on anterior right shin has essentially resolved, although there is a residual slightly erythematous mark.  Multiple bruises over skin over forearms  Neurological:     Mental Status: She is alert and oriented to person, place, and time.  Psychiatric:        Behavior: Behavior normal.     Assessment/Plan  1. Chronic respiratory failure with hypoxia (HCC) She is requiring higher oxygen need with exertion now.  Family is managing well, and although patient is frustrated, she is coping fairly well.  Continue to use oxygen and increase as needed.  She does follow with pulmonology.  2. Osteoporosis, unspecified osteoporosis type, unspecified pathological fracture presence History of osteoporosis.  Will recheck vitamin D levels, especially since she is complaining of increased fatigue. - VITAMIN D 25 Hydroxy (Vit-D Deficiency, Fractures); Future - VITAMIN D 25 Hydroxy (Vit-D  Deficiency, Fractures)  3. Vitamin B12 deficiency Rechecking for baseline levels. - Vitamin B12; Future - Vitamin B12  4. Lower extremity edema This is more localized to the feet, does appear better than previous.  I feel this is an uphill battle since she sits with feet down.  They do try to elevate, and usually edema does improve by the morning, but she is unable to wear compression stockings due to discomfort and pressure feels in the back.  She is unable to rest heels on anything, and does not like sensation of legs resting on padding to be elevated.  She is unable to be active on the feet, which promotes the dependent edema.  Continue to monitor for worsening, but otherwise I would keep current management.  5. Fatigue, unspecified type - TSH; Future - Comprehensive metabolic panel; Future - CBC with Differential/Platelet; Future - CBC with Differential/Platelet - Comprehensive metabolic panel - TSH  6. Anemia, unspecified type - Iron, TIBC and Ferritin Panel  7.  Constipation, unspecified constipation type Advised considering fiber supplement (like Metamucil) rather than the MiraLAX.  If still not having success with bowel movements, then can use MiraLAX along with Metamucil.  Stressed importance of drinking water throughout the day, and she will try to do this.  8.  Anxiety For now, they are to continue Lexapro 5 mg, but I did send in 90 tablets and they will increase to 10 mg if they feel necessary.   Return for pending bloodwork.  Time is chart review, visit, discussion of chronic conditions and follow-up, and charting time total 40 minutes.  Theodis Shove, MD

## 2020-05-08 LAB — CBC WITH DIFFERENTIAL/PLATELET
Absolute Monocytes: 703 cells/uL (ref 200–950)
Basophils Absolute: 37 cells/uL (ref 0–200)
Basophils Relative: 0.5 %
Eosinophils Absolute: 340 cells/uL (ref 15–500)
Eosinophils Relative: 4.6 %
HCT: 31.9 % — ABNORMAL LOW (ref 35.0–45.0)
Hemoglobin: 10.1 g/dL — ABNORMAL LOW (ref 11.7–15.5)
Lymphs Abs: 1251 cells/uL (ref 850–3900)
MCH: 28.7 pg (ref 27.0–33.0)
MCHC: 31.7 g/dL — ABNORMAL LOW (ref 32.0–36.0)
MCV: 90.6 fL (ref 80.0–100.0)
MPV: 10.1 fL (ref 7.5–12.5)
Monocytes Relative: 9.5 %
Neutro Abs: 5069 cells/uL (ref 1500–7800)
Neutrophils Relative %: 68.5 %
Platelets: 322 10*3/uL (ref 140–400)
RBC: 3.52 10*6/uL — ABNORMAL LOW (ref 3.80–5.10)
RDW: 12.8 % (ref 11.0–15.0)
Total Lymphocyte: 16.9 %
WBC: 7.4 10*3/uL (ref 3.8–10.8)

## 2020-05-08 LAB — COMPREHENSIVE METABOLIC PANEL
AG Ratio: 1.5 (calc) (ref 1.0–2.5)
ALT: 10 U/L (ref 6–29)
AST: 14 U/L (ref 10–35)
Albumin: 3.8 g/dL (ref 3.6–5.1)
Alkaline phosphatase (APISO): 56 U/L (ref 37–153)
BUN/Creatinine Ratio: 25 (calc) — ABNORMAL HIGH (ref 6–22)
BUN: 13 mg/dL (ref 7–25)
CO2: 31 mmol/L (ref 20–32)
Calcium: 9.5 mg/dL (ref 8.6–10.4)
Chloride: 93 mmol/L — ABNORMAL LOW (ref 98–110)
Creat: 0.51 mg/dL — ABNORMAL LOW (ref 0.60–0.88)
Globulin: 2.5 g/dL (calc) (ref 1.9–3.7)
Glucose, Bld: 94 mg/dL (ref 65–99)
Potassium: 4.7 mmol/L (ref 3.5–5.3)
Sodium: 131 mmol/L — ABNORMAL LOW (ref 135–146)
Total Bilirubin: 0.3 mg/dL (ref 0.2–1.2)
Total Protein: 6.3 g/dL (ref 6.1–8.1)

## 2020-05-08 LAB — IRON,TIBC AND FERRITIN PANEL
%SAT: 17 % (calc) (ref 16–45)
Ferritin: 18 ng/mL (ref 16–288)
Iron: 61 ug/dL (ref 45–160)
TIBC: 361 mcg/dL (calc) (ref 250–450)

## 2020-05-08 LAB — VITAMIN B12: Vitamin B-12: 1072 pg/mL (ref 200–1100)

## 2020-05-08 LAB — TSH: TSH: 2.15 mIU/L (ref 0.40–4.50)

## 2020-05-08 LAB — VITAMIN D 25 HYDROXY (VIT D DEFICIENCY, FRACTURES): Vit D, 25-Hydroxy: 74 ng/mL (ref 30–100)

## 2020-05-11 ENCOUNTER — Encounter: Payer: Self-pay | Admitting: *Deleted

## 2020-05-18 ENCOUNTER — Telehealth: Payer: Self-pay | Admitting: Internal Medicine

## 2020-05-18 MED ORDER — ALBUTEROL SULFATE (2.5 MG/3ML) 0.083% IN NEBU: 2.5000 mg | INHALATION_SOLUTION | RESPIRATORY_TRACT | 5 refills | Status: DC | PRN

## 2020-05-18 NOTE — Telephone Encounter (Signed)
Refill of Albuterol nebulizer solution sent per patient request.

## 2020-05-31 ENCOUNTER — Other Ambulatory Visit: Payer: Self-pay | Admitting: Family Medicine

## 2020-06-06 ENCOUNTER — Other Ambulatory Visit: Payer: Self-pay | Admitting: Family Medicine

## 2020-06-06 DIAGNOSIS — F419 Anxiety disorder, unspecified: Secondary | ICD-10-CM

## 2020-06-14 ENCOUNTER — Telehealth: Payer: Self-pay | Admitting: Family Medicine

## 2020-06-14 NOTE — Telephone Encounter (Signed)
[name] from [facility] stated that the pt is due for recertfication and they will be faxing a form over for signature and for it to be faxed back at the number listed on the form.   [name] can be reached at [phone]

## 2020-06-18 ENCOUNTER — Other Ambulatory Visit: Payer: Self-pay | Admitting: *Deleted

## 2020-06-18 ENCOUNTER — Telehealth: Payer: Self-pay | Admitting: *Deleted

## 2020-06-18 ENCOUNTER — Telehealth: Payer: Self-pay | Admitting: Internal Medicine

## 2020-06-18 NOTE — Telephone Encounter (Signed)
PA request was received from (pharmacy): [facility] Phone:  [phone] Medication name and strength: Pantoprazole 40mg  Ordering Provider: Dr.  Was PA started with [facility]?: yes If yes, please enter KEY: [id number] Medication tried and failed: Omeprazole    PA sent to plan, time frame for approval / denial: 5 business days Routing to [place] for follow-up

## 2020-06-18 NOTE — Telephone Encounter (Signed)
Noted  

## 2020-06-18 NOTE — Telephone Encounter (Signed)
Patient is under Hospice care and they are providing her medications.  I spoke with her son, Josiah Wojtaszek to verify the medication needing a PA.  He verified it is the pantoprazole.  PA completed.

## 2020-06-23 NOTE — Telephone Encounter (Signed)
Received approval on protonix from 05/20/20 until 06/19/21  [facility] and notified the pharmacist of this  Nothing further needed

## 2020-06-24 ENCOUNTER — Telehealth: Payer: Self-pay | Admitting: Internal Medicine

## 2020-06-24 ENCOUNTER — Telehealth: Payer: Self-pay | Admitting: Family Medicine

## 2020-06-24 NOTE — Telephone Encounter (Signed)
Sharon [name] is going to reach out to PCP. Patient has not been seen in over a year and needs a follow up. We do not have any appointments at this time.  Nothing further is needed at this time.

## 2020-06-24 NOTE — Telephone Encounter (Signed)
[name] from [facility] is wondering if the pt can take the Lexapro and Mirtazapine at night together? She stated the pt complains of feeling exhausted.   [name] can be reached at [phone]

## 2020-06-25 NOTE — Telephone Encounter (Signed)
*  she can take them together. We can also try to back off of these if she feels she is fatigued from medication. The remeron was started for some depression, to help with appetite and sleep. Lexapro is more for anxiety. We could have her break the mitrazepine in half at night if desired and see if this helps. Just let me know if more discussion needed about meds/side effects.

## 2020-06-25 NOTE — Telephone Encounter (Signed)
Left a message for [name] to return my call. 

## 2020-06-25 NOTE — Telephone Encounter (Signed)
Sharon [name] called back and was informed of the message below. 

## 2020-07-06 ENCOUNTER — Other Ambulatory Visit: Payer: Self-pay | Admitting: Family Medicine

## 2020-07-06 DIAGNOSIS — E538 Deficiency of other specified B group vitamins: Secondary | ICD-10-CM

## 2020-07-18 ENCOUNTER — Other Ambulatory Visit: Payer: Self-pay | Admitting: Family Medicine

## 2020-07-18 DIAGNOSIS — F320 Major depressive disorder, single episode, mild: Secondary | ICD-10-CM

## 2020-07-25 ENCOUNTER — Other Ambulatory Visit: Payer: Self-pay | Admitting: Family Medicine

## 2020-07-25 DIAGNOSIS — F419 Anxiety disorder, unspecified: Secondary | ICD-10-CM

## 2020-07-28 ENCOUNTER — Telehealth: Payer: Self-pay | Admitting: Internal Medicine

## 2020-07-28 MED ORDER — DULERA 200-5 MCG/ACT IN AERO: INHALATION_SPRAY | RESPIRATORY_TRACT | 0 refills | Status: DC

## 2020-07-28 MED ORDER — SPIRIVA RESPIMAT 2.5 MCG/ACT IN AERS: 2.0000 | INHALATION_SPRAY | Freq: Every day | RESPIRATORY_TRACT | 0 refills | Status: DC

## 2020-07-28 NOTE — Telephone Encounter (Signed)
Sharon [name] (on [facility]) stated that the pt has not been able to see Dr. Sherene Sires for the refill on Dulera and Spiriva in over a year so he will not fill these px however the pt is out. She is wondering if her PCP will since she was able to once before.  Sharon [name] can be reached at [phone]   Pharmacy:  [id number] - [name], [place] - [address] AT [facility] OF [address] Phone:  [phone]  Fax:  [phone]

## 2020-07-28 NOTE — Telephone Encounter (Signed)
Patient calling for refills on Spiriva and Dulera. Last OV was 07/2019, appointment made with NP Clent Ridges for follow up, dx COPD. Refill sent to preferred pharmacy.

## 2020-07-29 ENCOUNTER — Other Ambulatory Visit: Payer: Self-pay | Admitting: Family Medicine

## 2020-07-29 MED ORDER — SPIRIVA RESPIMAT 2.5 MCG/ACT IN AERS: 2.0000 | INHALATION_SPRAY | Freq: Every day | RESPIRATORY_TRACT | 2 refills | Status: DC

## 2020-07-29 MED ORDER — DULERA 200-5 MCG/ACT IN AERO: INHALATION_SPRAY | RESPIRATORY_TRACT | 2 refills | Status: DC

## 2020-07-29 NOTE — Telephone Encounter (Signed)
Refills have been sent.  

## 2020-07-29 NOTE — Telephone Encounter (Signed)
Noted  

## 2020-08-04 ENCOUNTER — Telehealth: Payer: Self-pay | Admitting: Family Medicine

## 2020-08-04 NOTE — Telephone Encounter (Signed)
I left a detailed message on Sharon [name]'s voicemail stating I do not see a form from 7/13 that has been scanned into the pts chart as that was over a month ago.  I asked that Steward Drone send the fax for review by Dr Hassan Rowan.

## 2020-08-04 NOTE — Telephone Encounter (Signed)
Steward Drone with [facility] is calling to see if the form from 7/13 has been filled out and faxed back [phone]).  She would like to have a call back.

## 2020-08-11 ENCOUNTER — Telehealth: Payer: Self-pay | Admitting: Primary Care

## 2020-08-11 ENCOUNTER — Encounter: Payer: Self-pay | Admitting: Primary Care

## 2020-08-11 ENCOUNTER — Ambulatory Visit (INDEPENDENT_AMBULATORY_CARE_PROVIDER_SITE_OTHER): Payer: Federal, State, Local not specified - PPO

## 2020-08-11 ENCOUNTER — Other Ambulatory Visit: Payer: Self-pay

## 2020-08-11 ENCOUNTER — Ambulatory Visit: Payer: Federal, State, Local not specified - PPO | Admitting: Primary Care

## 2020-08-11 VITALS — BP 120/70 | HR 74 | Temp 97.9°F | Ht 65.0 in | Wt 113.0 lb

## 2020-08-11 DIAGNOSIS — L03115 Cellulitis of right lower limb: Secondary | ICD-10-CM

## 2020-08-11 DIAGNOSIS — J449 Chronic obstructive pulmonary disease, unspecified: Secondary | ICD-10-CM

## 2020-08-11 DIAGNOSIS — J9611 Chronic respiratory failure with hypoxia: Secondary | ICD-10-CM

## 2020-08-11 MED ORDER — ALBUTEROL SULFATE (2.5 MG/3ML) 0.083% IN NEBU: 2.5000 mg | INHALATION_SOLUTION | RESPIRATORY_TRACT | 5 refills | Status: DC | PRN

## 2020-08-11 MED ORDER — DULERA 200-5 MCG/ACT IN AERO: INHALATION_SPRAY | RESPIRATORY_TRACT | 2 refills | Status: DC

## 2020-08-11 MED ORDER — SPIRIVA RESPIMAT 2.5 MCG/ACT IN AERS: 2.0000 | INHALATION_SPRAY | Freq: Every day | RESPIRATORY_TRACT | 2 refills | Status: DC

## 2020-08-11 NOTE — Assessment & Plan Note (Signed)
-   After skin bx right LE, doxycyline changed to Levaquin - Slight erythema around site. Encourage patient elevated leg during the day as much as possible and if unable recommend light compression

## 2020-08-11 NOTE — Patient Instructions (Addendum)
Recommendations: Continue Dulera 2 puffs twice daily Continue Spiriva 2 puffs once daily Take delsym or robitussin for as needed cough suppression   Take mucinex twice daily as needed for congestion/mucus (wet cough) Continue Nasacort nasal spray  Orders: CXR re: COPD/bronchitis  Follow-up: 1 year with Dr. Sherene Sires or sooner if needed

## 2020-08-11 NOTE — Telephone Encounter (Signed)
Received a call from [facility] Pulmonology dept with [facility] health. She stated she received a call from Radiology about the pt and the Chest x-ray she had suggest congestive heart failure.    More information down below.

## 2020-08-11 NOTE — Telephone Encounter (Signed)
Left a message for the pt to return my call.  

## 2020-08-11 NOTE — Progress Notes (Signed)
Please let patient know CXR showed findings consistent with congestive heart failure, likely why she has pedal edema. She should contact her PCP for further management. They may want her to take diuretic. No pneumonia.

## 2020-08-11 NOTE — Progress Notes (Signed)
@Patient  ID: , female    DOB: 14-Mar-1923, 86 y.o.   MRN: [id number]  Chief Complaint  Patient presents with  . Follow-up    COPD    Referring provider: [id number], MD  HPI: 86 year old female, former smoker quit 1980 (17.5-pack-year history).  Past medical history significant for COPD Gold 3, bronchiectasis, chronic respiratory failure (O2 dependent-3L), pulmonary nodule, proximal A. fib, dysphagia, esophageal stricture, osteoporosis.  Patient of Dr. 92, last seen 07/25/2019.  Patient maintained on Symbicort and Spiriva.  08/11/2020 Patient presents today for annual follow-up for COPD/chronic respiratory failure. Accomapanied by her She was recently on doxycycline for cellulitis after skin biopsy. This was changed to Levofloxacin after three day with no improvement. She prefers liquid medication if pills are large. She is currently on Dulera, this replaced Symbicort d/t cost. Continues Spiriva 2 puffs once daily. She uses albuterol nebulizer twice daily.    Pulmonary function testing 12/03/2009-FEV1 67 (40%) with 12% response to bronchodilator and DLCO 55%  Allergies  Allergen Reactions  . Penicillins Rash    Has patient had a PCN reaction causing immediate rash, facial/tongue/throat swelling, SOB or lightheadedness with hypotension: Yes Has patient had a PCN reaction causing severe rash involving mucus membranes or skin necrosis: Unknown Has patient had a PCN reaction that required hospitalization: Unknown Has patient had a PCN reaction occurring within the last 10 years: No If all of the above answers are "NO", then may proceed with Cephalosporin use.     Immunization History  Administered Date(s) Administered  . Fluad Quad(high Dose 65+) 08/12/2019  . Influenza Split 09/26/2011, 09/27/2012  . Influenza Whole 09/30/2009  . Influenza, High Dose Seasonal PF 09/24/2013, 01/10/2016, 09/21/2016, 09/25/2017, 09/18/2018  . Influenza-Unspecified 10/13/2014  .  PFIZER SARS-COV-2 Vaccination 12/29/2019, 01/19/2020  . Pneumococcal Conjugate-13 02/11/2014  . Pneumococcal Polysaccharide-23 05/05/2009  . Td 05/05/2009    Past Medical History:  Diagnosis Date  . Anemia   . B12 DEFICIENCY 03/31/2008  . COPD 11/27/2008  . ESOPHAGEAL STRICTURE 01/29/2009  . MASS, LUNG 10/13/2009  . NASAL FRACTURE 08/12/2009  . OSTEOARTHRITIS 03/31/2008   Hips  . OSTEOPOROSIS 12/27/2007  . PEDAL EDEMA 12/27/2007  . Pneumonia 07/10/2016  . PULMONARY NODULE 10/21/2009  . RESPIRATORY FAILURE, CHRONIC 06/29/2010  . Shortness of breath dyspnea     Tobacco History: Social History   Tobacco Use  Smoking Status Former Smoker  . Packs/day: 0.50  . Years: 35.00  . Pack years: 17.50  . Quit date: 12/18/1978  . Years since quitting: 41.6  Smokeless Tobacco Never Used   Counseling given: Not Answered   Outpatient Medications Prior to Visit  Medication Sig Dispense Refill  . ALPRAZolam (XANAX) 0.25 MG tablet TAKE 1 TABLET(0.25 MG) BY MOUTH TWICE DAILY AS NEEDED FOR ANXIETY 30 tablet 5  . Cholecalciferol 50000 units TABS Take 5,000 Units by mouth.    . cyanocobalamin (,VITAMIN B-12,) 1000 MCG/ML injection INJECT 1 ML IN THE MUSCLE MONTHLY 3 mL 3  . Dextromethorphan-guaiFENesin (MUCINEX FAST-MAX DM MAX) 5-100 MG/5ML LIQD Take 20 mLs by mouth 4 (four) times daily as needed (cough, congestion). 1775 mL 12  . escitalopram (LEXAPRO) 5 MG tablet TAKE 1 TABLET(5 MG) BY MOUTH DAILY 90 tablet 1  . famotidine (PEPCID) 20 MG tablet One at bedtime 30 tablet 2  . HYDROcodone-acetaminophen (HYCET) 7.5-325 mg/15 ml solution Take 5-10 mLs by mouth 2 (two) times daily as needed for severe pain. 120 mL 0  . mirtazapine (REMERON) 15 MG tablet  TAKE 1 TABLET(15 MG) BY MOUTH AT BEDTIME 90 tablet 1  . Multiple Vitamins-Minerals (ICAPS AREDS 2 PO) Take 1 tablet by mouth daily.    . mupirocin ointment (BACTROBAN) 2 % Apply 1 application topically 2 (two) times daily. 30 g 2  . naproxen sodium (ALEVE)  220 MG tablet Take 220 mg by mouth as needed.    . OXYGEN Oxygen 3lpm 24/7    . pantoprazole (PROTONIX) 40 MG tablet Take 1 tablet (40 mg total) by mouth daily. Take 30-60 min before first meal of the day 90 tablet 3  . Polyethyl Glycol-Propyl Glycol (SYSTANE OP) Place 1 drop into both eyes daily.     . polyethylene glycol powder (GLYCOLAX/MIRALAX) 17 GM/SCOOP powder DISSOLVE 17 GRAMS(1 SCOOP) IN 6-8 OZ OF FLUID AND DRINK BY MOUTH ONCE DAILY AS NEEDED FOR CONSTIPATION 238 g 2  . triamcinolone (NASACORT) 55 MCG/ACT AERO nasal inhaler Place 2 sprays into the nose daily. 1 Inhaler 12  . Wheat Dextrin (BENEFIBER) POWD Take 1 Dose by mouth as needed.    Marland Kitchen albuterol (PROVENTIL) (2.5 MG/3ML) 0.083% nebulizer solution Take 3 mLs (2.5 mg total) by nebulization every 4 (four) hours as needed for wheezing or shortness of breath. 150 mL 5  . mometasone-formoterol (DULERA) 200-5 MCG/ACT AERO INHALE 2 PUFFS BY MOUTH FIRST THING IN MORNING AND THEN ANOTHER 2 PUFFS ABOUT 12 HOURS LATER 13 g 2  . Polyethylene Glycol 3350 POWD 1 Dose by Does not apply route as needed.    . Tiotropium Bromide Monohydrate (SPIRIVA RESPIMAT) 2.5 MCG/ACT AERS Inhale 2 puffs into the lungs daily. 4 g 2   No facility-administered medications prior to visit.   Review of Systems  Review of Systems  Constitutional: Negative.   Respiratory: Positive for cough. Negative for chest tightness, shortness of breath and wheezing.    Physical Exam  BP 120/70 (BP Location: Right Arm)   Pulse 74   Temp 97.9 F (36.6 C) (Oral)   Ht 5\' 5"  (1.651 m)   Wt 113 lb (51.3 kg)   SpO2 99%   BMI 18.80 kg/m  Physical Exam Constitutional:      Appearance: Normal appearance.  HENT:     Head: Normocephalic and atraumatic.     Mouth/Throat:     Mouth: Mucous membranes are moist.     Pharynx: Oropharynx is clear.  Cardiovascular:     Rate and Rhythm: Normal rate and regular rhythm.     Comments: +trace-1 left pedal edema and right LE edema    Pulmonary:     Effort: Pulmonary effort is normal.     Breath sounds: Normal breath sounds.  Musculoskeletal:     Comments: In [place]  Skin:    General: Skin is warm and dry.  Neurological:     General: No focal deficit present.     Mental Status: She is alert and oriented to person, place, and time. Mental status is at baseline.  Psychiatric:        Mood and Affect: Mood normal.        Behavior: Behavior normal.        Thought Content: Thought content normal.        Judgment: Judgment normal.     Lab Results:  CBC    Component Value Date/Time   WBC 7.4 05/07/2020 1558   RBC 3.52 (L) 05/07/2020 1558   HGB 10.1 (L) 05/07/2020 1558   HCT 31.9 (L) 05/07/2020 1558   PLT 322 05/07/2020 1558  MCV 90.6 05/07/2020 1558   MCH 28.7 05/07/2020 1558   MCHC 31.7 (L) 05/07/2020 1558   RDW 12.8 05/07/2020 1558   LYMPHSABS 1,251 05/07/2020 1558   MONOABS 1.5 (H) 09/01/2019 1449   EOSABS 340 05/07/2020 1558   BASOSABS 37 05/07/2020 1558    BMET    Component Value Date/Time   NA 131 (L) 05/07/2020 1558   K 4.7 05/07/2020 1558   CL 93 (L) 05/07/2020 1558   CO2 31 05/07/2020 1558   GLUCOSE 94 05/07/2020 1558   BUN 13 05/07/2020 1558   CREATININE 0.51 (L) 05/07/2020 1558   CALCIUM 9.5 05/07/2020 1558   GFRNONAA >60 08/26/2019 1300   GFRAA >60 08/26/2019 1300    BNP No results found for: BNP  ProBNP    Component Value Date/Time   PROBNP 67.0 07/18/2017 1241    Imaging: No results found.   Assessment & Plan:   COPD GOLD III  - Stable interval, no significant changes in respiratory status. She has a slight cough, mostly dry - Continue Dulera 2 puffs twice daily; Spiriva 2 puffs once daily - Take delsym or robitussin for as needed cough suppression And mucinex twice daily as needed for congestion/mucus  - Continue Nasacort nasal spray - Orders: CXR re: COPD - Follow-up:- 1 year with Dr. Sherene Sires or sooner if needed   Chronic respiratory failure with hypoxia (HCC) - On  oxygen since 2017 after CAP - Remains on 4L oxygen  Cellulitis of right lower extremity - After skin bx right LE, doxycyline changed to Levaquin - Slight erythema around site. Encourage patient elevated leg during the day as much as possible and if unable recommend light compression   Glenford Bayley, NP 08/11/2020

## 2020-08-11 NOTE — Progress Notes (Signed)
Attempted to call patient on her home and cell number.  Left VM to return our call regarding her CXR.  Contacted her PCP office and advised them as well about the CXR indicating a degree of congestive heart failure.  Patient has an appointment with PCP on Friday 8/27.  Routing CXR to PCP as well.

## 2020-08-11 NOTE — Telephone Encounter (Signed)
Received a stat call report from Jan with [facility] Radiology: IMPRESSION: Mild cardiomegaly with a degree of pulmonary vascular congestion. Pleural effusions interstitial edema bilaterally. These are findings indicative of a degree of congestive heart failure.  According to the result note, Sharon [name] was already aware of this and is advising patient f/u with PCP.  I placed a call to the patient's home # and reached her VM.  Left VM to return our call regarding her CXR results.  I also tried her mobile # and left a VM requesting that she return our call.    Called patient's pcp office, Dr. Theodis Shove and spoke with the reception staff and let them know about the radiology report suggesting a degree of congestive heart failure and that Sharon [name] needs for her to f/u with her pcp.  Advised that she has an appointment on Friday and a note will be sent back to her pcp.  I will also route the CXR to her pcp.

## 2020-08-11 NOTE — Telephone Encounter (Signed)
She has had lasix in past for edema. I would encourage her to take one for next few days to help get some fluid off. If she needs refill, ok to send (in historical meds lasix 20mg ). If this causes too much urination/too many bathroom breaks, she can see how leg swelling/breathing feel after one and just go from there prn. It didn't sound like breathing was necessarily feeling worse at pulm visit? So if she feels that things are stable just getting a couple of doses in should help from fluid standpoint.

## 2020-08-11 NOTE — Assessment & Plan Note (Addendum)
-   On oxygen since 2017 after CAP - Remains on 4L oxygen

## 2020-08-11 NOTE — Assessment & Plan Note (Signed)
-   Stable interval, no significant changes in respiratory status. She has a slight cough, mostly dry - Continue Dulera 2 puffs twice daily; Spiriva 2 puffs once daily - Take delsym or robitussin for as needed cough suppression And mucinex twice daily as needed for congestion/mucus  - Continue Nasacort nasal spray - Orders: CXR re: COPD - Follow-up:- 1 year with Dr. Sherene Sires or sooner if needed

## 2020-08-13 ENCOUNTER — Encounter: Payer: Self-pay | Admitting: Family Medicine

## 2020-08-13 ENCOUNTER — Ambulatory Visit: Payer: Federal, State, Local not specified - PPO | Admitting: Family Medicine

## 2020-08-13 ENCOUNTER — Other Ambulatory Visit: Payer: Self-pay

## 2020-08-13 VITALS — BP 118/70 | HR 86 | Temp 98.5°F | Ht 65.0 in

## 2020-08-13 DIAGNOSIS — S81801A Unspecified open wound, right lower leg, initial encounter: Secondary | ICD-10-CM

## 2020-08-13 DIAGNOSIS — D509 Iron deficiency anemia, unspecified: Secondary | ICD-10-CM

## 2020-08-13 DIAGNOSIS — F320 Major depressive disorder, single episode, mild: Secondary | ICD-10-CM | POA: Diagnosis not present

## 2020-08-13 DIAGNOSIS — F419 Anxiety disorder, unspecified: Secondary | ICD-10-CM | POA: Diagnosis not present

## 2020-08-13 DIAGNOSIS — J449 Chronic obstructive pulmonary disease, unspecified: Secondary | ICD-10-CM

## 2020-08-13 DIAGNOSIS — L03115 Cellulitis of right lower limb: Secondary | ICD-10-CM

## 2020-08-13 MED ORDER — FUROSEMIDE 20 MG PO TABS: 20.0000 mg | ORAL_TABLET | Freq: Every day | ORAL | 2 refills | Status: DC | PRN

## 2020-08-13 MED ORDER — MIRTAZAPINE 15 MG PO TABS: ORAL_TABLET | ORAL | 1 refills | Status: DC

## 2020-08-13 MED ORDER — ESCITALOPRAM OXALATE 5 MG PO TABS: ORAL_TABLET | ORAL | 1 refills | Status: DC

## 2020-08-13 MED ORDER — PANTOPRAZOLE SODIUM 40 MG PO TBEC: 40.0000 mg | DELAYED_RELEASE_TABLET | Freq: Every day | ORAL | 3 refills | Status: DC

## 2020-08-13 MED ORDER — CEPHALEXIN 250 MG/5ML PO SUSR: 500.0000 mg | Freq: Three times a day (TID) | ORAL | 0 refills | Status: DC

## 2020-08-13 NOTE — Telephone Encounter (Signed)
Patient was seen by PCP in the office today.

## 2020-08-13 NOTE — Progress Notes (Signed)
Sharon [name] DOB: 09-21-1923 Encounter date: 08/13/2020  This is a 86 y.o. female who presents with Chief Complaint  Patient presents with  . Follow-up    History of present illness: Breathing stable. No change in oxygen need, but recently have realized they were running at less than 4 and didn't realize. This is just on her portable tank when out of house. They hadn't heard about cxr results, but we reviewed today that appearance suggested HF; see A/P below.  Working on wounds for leg. Daughter has been putting silver alginate dressing on wound bed and does feel like wound on upper leg is improving. Wounds did look better on antibiotics and redness did improve with this. Do feel like posterior wound has worsened in terms of redness, edema in recent weeks. Leg is painful for her. She does not elevate legs. She doesn't like heel cups and is not using them, and daughter worried she will get heel sores again.   Appetite has been stable.   In last week has had half dose miralax and some benefiber. Having bowel movements now. Gives miralax if she doesn't go for a few days.    Allergies  Allergen Reactions  . Penicillins Rash    Has patient had a PCN reaction causing immediate rash, facial/tongue/throat swelling, SOB or lightheadedness with hypotension: Yes Has patient had a PCN reaction causing severe rash involving mucus membranes or skin necrosis: Unknown Has patient had a PCN reaction that required hospitalization: Unknown Has patient had a PCN reaction occurring within the last 10 years: No If all of the above answers are "NO", then may proceed with Cephalosporin use.    Current Meds  Medication Sig  . albuterol (PROVENTIL) (2.5 MG/3ML) 0.083% nebulizer solution Take 3 mLs (2.5 mg total) by nebulization every 4 (four) hours as needed for wheezing or shortness of breath.  . ALPRAZolam (XANAX) 0.25 MG tablet TAKE 1 TABLET(0.25 MG) BY MOUTH TWICE DAILY AS NEEDED FOR ANXIETY  .  Cholecalciferol 50000 units TABS Take 5,000 Units by mouth.  . cyanocobalamin (,VITAMIN B-12,) 1000 MCG/ML injection INJECT 1 ML IN THE MUSCLE MONTHLY  . Dextromethorphan-guaiFENesin (MUCINEX FAST-MAX DM MAX) 5-100 MG/5ML LIQD Take 20 mLs by mouth 4 (four) times daily as needed (cough, congestion).  Marland Kitchen escitalopram (LEXAPRO) 5 MG tablet TAKE 1 TABLET(5 MG) BY MOUTH DAILY  . famotidine (PEPCID) 20 MG tablet One at bedtime  . HYDROcodone-acetaminophen (HYCET) 7.5-325 mg/15 ml solution Take 5-10 mLs by mouth 2 (two) times daily as needed for severe pain.  . mirtazapine (REMERON) 15 MG tablet TAKE 1 TABLET(15 MG) BY MOUTH AT BEDTIME  . mometasone-formoterol (DULERA) 200-5 MCG/ACT AERO INHALE 2 PUFFS BY MOUTH FIRST THING IN MORNING AND THEN ANOTHER 2 PUFFS ABOUT 12 HOURS LATER  . Multiple Vitamins-Minerals (ICAPS AREDS 2 PO) Take 1 tablet by mouth daily.  . mupirocin ointment (BACTROBAN) 2 % Apply 1 application topically 2 (two) times daily.  . naproxen sodium (ALEVE) 220 MG tablet Take 220 mg by mouth as needed.  . OXYGEN Oxygen 3lpm 24/7  . pantoprazole (PROTONIX) 40 MG tablet Take 1 tablet (40 mg total) by mouth daily. Take 30-60 min before first meal of the day  . Polyethyl Glycol-Propyl Glycol (SYSTANE OP) Place 1 drop into both eyes daily.   . polyethylene glycol powder (GLYCOLAX/MIRALAX) 17 GM/SCOOP powder DISSOLVE 17 GRAMS(1 SCOOP) IN 6-8 OZ OF FLUID AND DRINK BY MOUTH ONCE DAILY AS NEEDED FOR CONSTIPATION  . Tiotropium Bromide Monohydrate (SPIRIVA RESPIMAT) 2.5  MCG/ACT AERS Inhale 2 puffs into the lungs daily.  Marland Kitchen triamcinolone (NASACORT) 55 MCG/ACT AERO nasal inhaler Place 2 sprays into the nose daily.  . Wheat Dextrin (BENEFIBER) POWD Take 1 Dose by mouth as needed.  . [DISCONTINUED] escitalopram (LEXAPRO) 5 MG tablet TAKE 1 TABLET(5 MG) BY MOUTH DAILY  . [DISCONTINUED] mirtazapine (REMERON) 15 MG tablet TAKE 1 TABLET(15 MG) BY MOUTH AT BEDTIME  . [DISCONTINUED] pantoprazole (PROTONIX) 40 MG  tablet Take 1 tablet (40 mg total) by mouth daily. Take 30-60 min before first meal of the day    Review of Systems  Constitutional: Positive for fatigue. Negative for chills and fever.  Respiratory: Positive for cough and shortness of breath. Negative for chest tightness and wheezing.   Cardiovascular: Positive for leg swelling. Negative for chest pain and palpitations.  Skin: Positive for color change and wound.       See attached pictures.    Objective:  BP 118/70 (BP Location: Left Arm, Patient Position: Sitting, Cuff Size: Normal)   Pulse 86   Temp 98.5 F (36.9 C) (Oral)   Ht 5\' 5"  (1.651 m)   SpO2 99%   BMI 18.80 kg/m       BP Readings from Last 3 Encounters:  08/13/20 118/70  08/11/20 120/70  01/23/20 120/68   Wt Readings from Last 3 Encounters:  08/11/20 113 lb (51.3 kg)  01/23/20 113 lb 1.6 oz (51.3 kg)  08/26/19 110 lb (49.9 kg)    Physical Exam Constitutional:      General: She is not in acute distress.    Appearance: She is underweight.  Cardiovascular:     Rate and Rhythm: Normal rate and regular rhythm.     Heart sounds: Normal heart sounds. No murmur heard.  No friction rub.  Pulmonary:     Effort: Pulmonary effort is normal. No respiratory distress.     Breath sounds: Examination of the right-middle field reveals rales. Examination of the left-middle field reveals rales. Examination of the right-lower field reveals rales. Examination of the left-lower field reveals rales. Decreased breath sounds and rales present. No wheezing.  Musculoskeletal:     Right lower leg: No edema.     Left lower leg: No edema.  Skin:    Comments: See attached photos.  Wound right posterior and lateral leg.  There is some warmth in areas of erythema.  Neurological:     Mental Status: She is alert and oriented to person, place, and time.  Psychiatric:        Behavior: Behavior normal.     Assessment/Plan 1. Iron deficiency anemia, unspecified iron deficiency anemia  type She has been unable to tolerate oral iron.  I am going to reach out to oncology/hematology to see if they feel she may benefit from an iron infusion.  Unfortunately, she does not eat very much so is not able to take in much iron per dietary sources.  With ongoing issues of constipation return because some abdominal discomfort, she has not been able to tolerate the side effects of oral iron. - CBC with Differential/Platelet; Future - Iron, TIBC and Ferritin Panel; Future - Iron, TIBC and Ferritin Panel - CBC with Differential/Platelet  2. Current mild episode of major depressive disorder without prior episode (HCC) Mood has been stable.  Continue current medication.  She does sleep okay, except for cough which wakes her.  Does seem to do better with sleep using albuterol prior to bedtime. - mirtazapine (REMERON) 15 MG tablet; TAKE 1  TABLET(15 MG) BY MOUTH AT BEDTIME  Dispense: 90 tablet; Refill: 1  3. Chronic obstructive pulmonary disease, unspecified COPD type (HCC) No significant change in breathing.  She is oxygen dependent.  We discussed using Lasix for a few days to help get out any fluid in the chest.  This should help with breathing as well. - pantoprazole (PROTONIX) 40 MG tablet; Take 1 tablet (40 mg total) by mouth daily. Take 30-60 min before first meal of the day  Dispense: 90 tablet; Refill: 3  4. Anxiety Has been stable.  Continue Lexapro. - escitalopram (LEXAPRO) 5 MG tablet; TAKE 1 TABLET(5 MG) BY MOUTH DAILY  Dispense: 90 tablet; Refill: 1  5. Wound of right lower extremity, initial encounter Continue the silver alginate on the posterior leg wound.  Lateral leg wound appears to be improving.  Discussed keeping skin well moisturized to help prevent any further cracking/infection.  Encouraged her to keep legs elevated to help with edema.  I do feel like Lasix for a few days will help decrease edema which should help increase circulation.  Additionally, we are going to add an  antibiotic (Keflex) to help cover for skin infection.  6.  Cellulitis of right leg -see above  Return in about 3 months (around 11/13/2020) for Chronic condition visit.    Theodis Shove, MD

## 2020-08-13 NOTE — Progress Notes (Signed)
Message was sent me as well.  We will discuss today at her visit.

## 2020-08-13 NOTE — Patient Instructions (Signed)
Update me next week with how skin is looking after taking antibiotics.

## 2020-08-14 LAB — CBC WITH DIFFERENTIAL/PLATELET
Absolute Monocytes: 804 cells/uL (ref 200–950)
Basophils Absolute: 49 cells/uL (ref 0–200)
Basophils Relative: 0.5 %
Eosinophils Absolute: 578 cells/uL — ABNORMAL HIGH (ref 15–500)
Eosinophils Relative: 5.9 %
HCT: 30.2 % — ABNORMAL LOW (ref 35.0–45.0)
Hemoglobin: 9.7 g/dL — ABNORMAL LOW (ref 11.7–15.5)
Lymphs Abs: 1695 cells/uL (ref 850–3900)
MCH: 29 pg (ref 27.0–33.0)
MCHC: 32.1 g/dL (ref 32.0–36.0)
MCV: 90.1 fL (ref 80.0–100.0)
MPV: 9.7 fL (ref 7.5–12.5)
Monocytes Relative: 8.2 %
Neutro Abs: 6674 cells/uL (ref 1500–7800)
Neutrophils Relative %: 68.1 %
Platelets: 323 10*3/uL (ref 140–400)
RBC: 3.35 10*6/uL — ABNORMAL LOW (ref 3.80–5.10)
RDW: 12.4 % (ref 11.0–15.0)
Total Lymphocyte: 17.3 %
WBC: 9.8 10*3/uL (ref 3.8–10.8)

## 2020-08-14 LAB — IRON,TIBC AND FERRITIN PANEL
%SAT: 10 % (calc) — ABNORMAL LOW (ref 16–45)
Ferritin: 19 ng/mL (ref 16–288)
Iron: 38 ug/dL — ABNORMAL LOW (ref 45–160)
TIBC: 373 mcg/dL (calc) (ref 250–450)

## 2020-08-16 ENCOUNTER — Other Ambulatory Visit: Payer: Self-pay | Admitting: Family Medicine

## 2020-08-16 DIAGNOSIS — E611 Iron deficiency: Secondary | ICD-10-CM

## 2020-08-16 NOTE — Progress Notes (Signed)
Could you please let [name]/family know that I did reach out to hematology and they did think that an iron transfusion would be reasonable to try. I will put in referral for this. I was in contact with Dr. Leonides Schanz [facility] for them). Hopefully this will help with energy some! Please make sure leg is looking better. Thanks!

## 2020-08-17 ENCOUNTER — Encounter: Payer: Self-pay | Admitting: Family Medicine

## 2020-08-17 ENCOUNTER — Telehealth: Payer: Self-pay | Admitting: *Deleted

## 2020-08-17 ENCOUNTER — Telehealth: Payer: Self-pay | Admitting: Hematology and Oncology

## 2020-08-17 NOTE — Telephone Encounter (Signed)
-----   Message from Wynn Banker, MD sent at 08/16/2020  8:20 PM EDT -----   ----- Message ----- From: Jaci Standard, MD Sent: 08/15/2020   2:20 PM EDT To: Wynn Banker, MD  Sharon [name],  Thank you for reaching out. Her iron levels are indeed quite low. In the setting of chronic disease iron utilization by the bone marrow is normally poor, but I think it would be reasonable to give her iron infusions to see if there is some improvement in her overall energy level. I'd be happy to see her if that is something she is interested in pursuing.  Azucena Freed  ----- Message ----- From: Wynn Banker, MD Sent: 08/15/2020  11:25 AM EDT To: Jaci Standard, MD  I was just hoping you could give your opinion for this sweet patient. She is very frail, has been on hospice for over a year for advanced COPD, oxygen dependent. Her exercise tolerance is almost zero due to shortness of breath. She can barely make it to bathroom. She is anemic, but cannot tolerate iron supplementation due to stomach issues. Just wondering if she may benefit from iron infusion. I didn't want to put her through referral process if you didn't think it would help her. I just feel that anything that may boost her energy/oxygen carrying ability could give her a little help. Please let me know what you think. Appreciate your help!-Sharon [name]

## 2020-08-17 NOTE — Telephone Encounter (Signed)
Spoke with the patients daughter in law to inform her of the message below.  She is aware someone will call with appt info for the iron infusion and questioned who Dr Leonides Schanz is?  Message sent to PCP.

## 2020-08-17 NOTE — Telephone Encounter (Signed)
Received a new hem referral from Dr. Hassan Rowan for iron defiency. Ms. Saddler has been scheduled to see Dr. Leonides Schanz on 9/15 at 2pm. Appt date and time has been given to her DIL. Aware to arrive 15 minutes early.

## 2020-08-17 NOTE — Telephone Encounter (Signed)
-----   Message from Wynn Banker, MD sent at 08/15/2020 11:39 AM EDT ----- Please check in with her next week and see how skin is looking and how breathing is doing. Hoping that she notes some improved breathing after lasix. Please check with daughter/son/patient with regards to followup. Given pattern of congestion, we could plan to repeat a chest xray to make sure fluid clears or could consider [place] of heart to assess function as it appears that perhaps lung disease is causing strain on the heart. Her last [place] looked really good, but that was done quite a few years ago. I just don't want to put her through any unnecessary work up, so if breathing is feeling stable, then we can also just monitor.

## 2020-08-17 NOTE — Telephone Encounter (Signed)
Spoke with the pts daughter in law and informed her of the message below.  She stated the leg looks a little better and the pts breathing and swelling are the same and she has been on Lasix for 3 days.  Message sent to PCP to contact the family as she stated she would prefer PCP let her know what is recommended at this time.

## 2020-08-18 ENCOUNTER — Other Ambulatory Visit: Payer: Self-pay | Admitting: Family Medicine

## 2020-08-18 ENCOUNTER — Encounter: Payer: Self-pay | Admitting: Family Medicine

## 2020-08-18 DIAGNOSIS — J449 Chronic obstructive pulmonary disease, unspecified: Secondary | ICD-10-CM

## 2020-08-18 NOTE — Telephone Encounter (Signed)
I sent mychart message

## 2020-08-25 ENCOUNTER — Ambulatory Visit (INDEPENDENT_AMBULATORY_CARE_PROVIDER_SITE_OTHER)
Admission: RE | Admit: 2020-08-25 | Discharge: 2020-08-25 | Disposition: A | Discharge status: Home / Self Care | Payer: Federal, State, Local not specified - PPO | Source: Ambulatory Visit | Attending: Family Medicine | Admitting: Family Medicine

## 2020-08-25 ENCOUNTER — Other Ambulatory Visit: Payer: Self-pay

## 2020-08-25 DIAGNOSIS — J449 Chronic obstructive pulmonary disease, unspecified: Secondary | ICD-10-CM | POA: Diagnosis not present

## 2020-08-26 ENCOUNTER — Encounter: Payer: Self-pay | Admitting: Family Medicine

## 2020-08-31 NOTE — Progress Notes (Signed)
[facility] [phone]   [phone]  INITIAL CONSULT NOTE  Patient Care Team: Wynn Banker, MD as PCP - General (Family Medicine)  Hematological/Oncological History # Iron Deficiency Anemia of Unclear Etiology 1) 08/13/2020: WBC 9.8, Hgb 9.7, MCV 90.1, Plt 323. Iron 38, TIBC 373, Ferritin 19, Tsat 10% 2) 09/01/2020: establish care with Dr. Leonides Schanz  CHIEF COMPLAINTS/PURPOSE OF CONSULTATION:  "Iron deficiency anemia "  HISTORY OF PRESENTING ILLNESS:  Sharon Sharon [name] 86 y.o. Sharon [name] with medical history significant for COPD on hospice, Vitamin b12 deficiency, OA, and osteoporosis who presents for evaluation of iron deficiency anemia.   On review of the previous records Sharon Sharon [name] has a longstanding history of anemia dating back to at least 2017.  Most recently in August the patient was found to have a white blood cell count of 9.8, hemoglobin 9.7, MCV of 90.1, and a platelet count of 323.  Her iron levels at that time showed a ferritin of 19 and a TSAT of 10%.  She underwent colonoscopy about 15 years ago with a recent upper GI endoscopy in 2016.  Given that she is on hospice no further intervention has been requested at this time.  Additionally she has trialed iron pills before in the past but is caused her great GI distress.  Due to concerns for this patient's progressively worsening fatigue and shortness of breath and iron deficiency she was referred to hematology for consideration of IV iron.  On exam today Sharon Sharon [name] is accompanied by her daughter.  She reports that she has "had anemia for as long as I can remember".  She reports that it was never previously treated, but that her mother made her eat liver and drink eggnog.  She reports that she has tried to take iron pills before in the past but they made her sick to her stomach and cause alternating constipation and diarrhea.  She reports that she does not have any clear overt signs of bleeding, though  she does periodically have some nosebleeds.  She notes that she has not had any issues with dark bowel movements or loose stools.  She reports that she has "no energy".  And that has been getting progressively worse and worse she reports that she has not been having any issues with chest pain or lightheadedness/dizziness.  Her family history is remarkable for anemia in her paternal grandmother, but no other remarkable past medical history. She currently denies having any issues with fevers, chills, sweats, nausea, vomiting or diarrhea.  A full 10 point ROS is listed.  MEDICAL HISTORY:  Past Medical History:  Diagnosis Date  . Anemia   . B12 DEFICIENCY 03/31/2008  . COPD 11/27/2008  . ESOPHAGEAL STRICTURE 01/29/2009  . MASS, LUNG 10/13/2009  . NASAL FRACTURE 08/12/2009  . OSTEOARTHRITIS 03/31/2008   Hips  . OSTEOPOROSIS 12/27/2007  . PEDAL EDEMA 12/27/2007  . Pneumonia 07/10/2016  . PULMONARY NODULE 10/21/2009  . RESPIRATORY FAILURE, CHRONIC 06/29/2010  . Shortness of breath dyspnea     SURGICAL HISTORY: Past Surgical History:  Procedure Laterality Date  . APPENDECTOMY     '38  . BALLOON DILATION N/A 05/10/2015   Procedure: BALLOON DILATION;  Surgeon: Louis Meckel, MD;  Location: [facility];  Service: Endoscopy;  Laterality: N/A;  . BALLOON DILATION N/A 08/02/2015   Procedure: BALLOON DILATION;  Surgeon: Louis Meckel, MD;  Location: [facility];  Service: Endoscopy;  Laterality: N/A;  . BREAST SURGERY     biopsy '47  .  CATARACT EXTRACTION Bilateral    last '05  . CHOLECYSTECTOMY     laparoscopic'10  . ESOPHAGOGASTRODUODENOSCOPY     with dilatation x3  . ESOPHAGOGASTRODUODENOSCOPY (EGD) WITH PROPOFOL N/A 05/10/2015   Procedure: ESOPHAGOGASTRODUODENOSCOPY (EGD) WITH PROPOFOL;  Surgeon: Louis Meckelobert D Kaplan, MD;  Location: [facility];  Service: Endoscopy;  Laterality: N/A;  balloon dilation  . ESOPHAGOGASTRODUODENOSCOPY (EGD) WITH PROPOFOL N/A 08/02/2015   Procedure:  ESOPHAGOGASTRODUODENOSCOPY (EGD) WITH PROPOFOL;  Surgeon: Louis Meckelobert D Kaplan, MD;  Location: [facility];  Service: Endoscopy;  Laterality: N/A;  . LUMBAR LAMINECTOMY    . NASAL RECONSTRUCTION WITH SEPTAL REPAIR     '10  . RECTAL PROLAPSE REPAIR     '06  . skin graf     '98    SOCIAL HISTORY: Social History   Socioeconomic History  . Marital status: Widowed    Spouse name: Not on file  . Number of children: Not on file  . Years of education: Not on file  . Highest education level: Not on file  Occupational History  . Not on file  Tobacco Use  . Smoking status: Former Smoker    Packs/day: 0.50    Years: 35.00    Pack years: 17.50    Quit date: 12/18/1978    Years since quitting: 41.7  . Smokeless tobacco: Never Used  Vaping Use  . Vaping Use: Never used  Substance and Sexual Activity  . Alcohol use: Yes    Alcohol/week: 0.0 standard drinks    Comment: couple of gin and tonics daily  . Drug use: No  . Sexual activity: Not on file  Other Topics Concern  . Not on file  Social History Narrative  . Not on file   Social Determinants of Health   Financial Resource Strain:   . Difficulty of Paying Living Expenses: Not on file  Food Insecurity:   . Worried About Programme researcher, broadcasting/film/videounning Out of Food in the Last Year: Not on file  . Ran Out of Food in the Last Year: Not on file  Transportation Needs:   . Lack of Transportation (Medical): Not on file  . Lack of Transportation (Non-Medical): Not on file  Physical Activity:   . Days of Exercise per Week: Not on file  . Minutes of Exercise per Session: Not on file  Stress:   . Feeling of Stress : Not on file  Social Connections:   . Frequency of Communication with Friends and Family: Not on file  . Frequency of Social Gatherings with Friends and Family: Not on file  . Attends Religious Services: Not on file  . Active Member of Clubs or Organizations: Not on file  . Attends BankerClub or Organization Meetings: Not on file  . Marital Status: Not on  file  Intimate Partner Violence:   . Fear of Current or Ex-Partner: Not on file  . Emotionally Abused: Not on file  . Physically Abused: Not on file  . Sexually Abused: Not on file    FAMILY HISTORY: Family History  Problem Relation Age of Onset  . Hypertension Son   . Asthma Mother   . Abdominal Wall Hernia Mother   . Stomach cancer Mother     ALLERGIES:  is allergic to penicillins.  MEDICATIONS:  Current Outpatient Medications  Medication Sig Dispense Refill  . albuterol (PROVENTIL) (2.5 MG/3ML) 0.083% nebulizer solution Take 3 mLs (2.5 mg total) by nebulization every 4 (four) hours as needed for wheezing or shortness of breath. 150 mL 5  . ALPRAZolam (  XANAX) 0.25 MG tablet TAKE 1 TABLET(0.25 MG) BY MOUTH TWICE DAILY AS NEEDED FOR ANXIETY 30 tablet 5  . cephALEXin (KEFLEX) 250 MG/5ML suspension Take 10 mLs (500 mg total) by mouth 3 (three) times daily. 210 mL 0  . Cholecalciferol 50000 units TABS Take 5,000 Units by mouth.    . cyanocobalamin (,VITAMIN B-12,) 1000 MCG/ML injection INJECT 1 ML IN THE MUSCLE MONTHLY 3 mL 3  . Dextromethorphan-guaiFENesin (MUCINEX FAST-MAX DM MAX) 5-100 MG/5ML LIQD Take 20 mLs by mouth 4 (four) times daily as needed (cough, congestion). 1775 mL 12  . escitalopram (LEXAPRO) 5 MG tablet TAKE 1 TABLET(5 MG) BY MOUTH DAILY 90 tablet 1  . famotidine (PEPCID) 20 MG tablet One at bedtime 30 tablet 2  . furosemide (LASIX) 20 MG tablet Take 1 tablet (20 mg total) by mouth daily as needed. 30 tablet 2  . HYDROcodone-acetaminophen (HYCET) 7.5-325 mg/15 ml solution Take 5-10 mLs by mouth 2 (two) times daily as needed for severe pain. 120 mL 0  . mirtazapine (REMERON) 15 MG tablet TAKE 1 TABLET(15 MG) BY MOUTH AT BEDTIME 90 tablet 1  . mometasone-formoterol (DULERA) 200-5 MCG/ACT AERO INHALE 2 PUFFS BY MOUTH FIRST THING IN MORNING AND THEN ANOTHER 2 PUFFS ABOUT 12 HOURS LATER 13 g 2  . Multiple Vitamins-Minerals (ICAPS AREDS 2 PO) Take 1 tablet by mouth daily.      . mupirocin ointment (BACTROBAN) 2 % Apply 1 application topically 2 (two) times daily. 30 g 2  . naproxen sodium (ALEVE) 220 MG tablet Take 220 mg by mouth as needed.    . OXYGEN Oxygen 3lpm 24/7    . pantoprazole (PROTONIX) 40 MG tablet Take 1 tablet (40 mg total) by mouth daily. Take 30-60 min before first meal of the day 90 tablet 3  . Polyethyl Glycol-Propyl Glycol (SYSTANE OP) Place 1 drop into both eyes daily.     . polyethylene glycol powder (GLYCOLAX/MIRALAX) 17 GM/SCOOP powder DISSOLVE 17 GRAMS(1 SCOOP) IN 6-8 OZ OF FLUID AND DRINK BY MOUTH ONCE DAILY AS NEEDED FOR CONSTIPATION 238 g 2  . Tiotropium Bromide Monohydrate (SPIRIVA RESPIMAT) 2.5 MCG/ACT AERS Inhale 2 puffs into the lungs daily. 4 g 2  . triamcinolone (NASACORT) 55 MCG/ACT AERO nasal inhaler Place 2 sprays into the nose daily. 1 Inhaler 12  . Wheat Dextrin (BENEFIBER) POWD Take 1 Dose by mouth as needed.     No current facility-administered medications for this visit.    REVIEW OF SYSTEMS:   Constitutional: ( - ) fevers, ( - )  chills , ( - ) night sweats Eyes: ( - ) blurriness of vision, ( - ) double vision, ( - ) watery eyes Ears, nose, mouth, throat, and face: ( - ) mucositis, ( - ) sore throat Respiratory: ( - ) cough, ( - ) dyspnea, ( - ) wheezes Cardiovascular: ( - ) palpitation, ( - ) chest discomfort, ( - ) lower extremity swelling Gastrointestinal:  ( - ) nausea, ( - ) heartburn, ( - ) change in bowel habits Skin: ( - ) abnormal skin rashes Lymphatics: ( - ) new lymphadenopathy, ( - ) easy bruising Neurological: ( - ) numbness, ( - ) tingling, ( - ) new weaknesses Behavioral/Psych: ( - ) mood change, ( - ) new changes  All other systems were reviewed with the patient and are negative.  PHYSICAL EXAMINATION: ECOG PERFORMANCE STATUS: 3 - Symptomatic, >50% confined to bed  There were no vitals filed for this visit. There  were no vitals filed for this visit.  GENERAL: chronically ill appearing elderly  Caucasian Sharon [name] in NAD  SKIN: skin color, texture, turgor are normal, no rashes or significant lesions EYES: conjunctiva are pink and non-injected, sclera clear LUNGS: diminished breath sounds bilaterally.  HEART: regular rate & rhythm and no murmurs and no lower extremity edema Musculoskeletal: no cyanosis of digits and no clubbing  PSYCH: alert & oriented x 3, fluent speech NEURO: no focal motor/sensory deficits  LABORATORY DATA:  I have reviewed the data as listed CBC Latest Ref Rng & Units 08/13/2020 05/07/2020 09/01/2019  WBC 3.8 - 10.8 Thousand/uL 9.8 7.4 13.0(H)  Hemoglobin 11.7 - 15.5 g/dL 8.6(V) 10.1(L) 10.5(L)  Hematocrit 35 - 45 % 30.2(L) 31.9(L) 32.0(L)  Platelets 140 - 400 Thousand/uL 323 322 491.0(H)    CMP Latest Ref Rng & Units 05/07/2020 09/01/2019 08/26/2019  Glucose 65 - 99 mg/dL 94 89 784(O)  BUN 7 - 25 mg/dL Creatinine 0.60 - 0.88 mg/dL 9.62(X) 5.28(U) 1.32  Sodium 135 - 146 mmol/L 131(L) 135 131(L)  Potassium 3.5 - 5.3 mmol/L 4.7 4.2 4.1  Chloride 98 - 110 mmol/L 93(L) 95(L) 93(L)  CO2 20 - 32 mmol/L 31 32 29  Calcium 8.6 - 10.4 mg/dL 9.5 8.9 9.6  Total Protein 6.1 - 8.1 g/dL 6.3 4.4(W) -  Total Bilirubin 0.2 - 1.2 mg/dL 0.3 0.4 -  Alkaline Phos 39 - 117 U/L - 88 -  AST 10 - 35 U/L 14 17 -  ALT 6 - 29 U/L 10 12 -   PATHOLOGY: None to review.   RADIOGRAPHIC STUDIES: DG Chest 2 View  Result Date: 08/26/2020 CLINICAL DATA:  Cough and chest congestion. Congestive heart failure. EXAM: CHEST - 2 VIEW COMPARISON:  08/11/2020 FINDINGS: Mild cardiomegaly remains stable. Aortic atherosclerosis noted. Decreased interstitial infiltrates noted, consistent with decreased interstitial edema. Small bilateral pleural effusions show no significant change, as well as infiltrate or atelectasis in the left lower lobe. No new or worsening areas of pulmonary opacity are seen. IMPRESSION: Decreased pulmonary interstitial edema since prior exam. No significant change in small  bilateral pleural effusions and left lower lobe atelectasis versus infiltrate. Electronically Signed   By: Danae Orleans M.D.   On: 08/26/2020 08:15   DG Chest 2 View  Result Date: 08/11/2020 CLINICAL DATA:  Cough EXAM: CHEST - 2 VIEW COMPARISON:  August 26, 2019 FINDINGS: There is mild cardiomegaly with pulmonary venous hypertension. There is diffuse interstitial edema with pleural effusions bilaterally. No consolidation. There is aortic atherosclerosis. No adenopathy. There is extensive postoperative change in the lumbar spine. IMPRESSION: Mild cardiomegaly with a degree of pulmonary vascular congestion. Pleural effusions interstitial edema bilaterally. These are findings indicative of a degree of congestive heart failure. Aortic Atherosclerosis (ICD10-I70.0). These results will be called to the ordering clinician or representative by the Radiologist Assistant, and communication documented in the PACS or [employer]. Electronically Signed   By: Bretta Bang III M.D.   On: 08/11/2020 15:39    ASSESSMENT & PLAN Sharon Sharon [name] 86 y.o. Sharon [name] with medical history significant for COPD on hospice, Vitamin b12 deficiency, OA, and osteoporosis who presents for evaluation of iron deficiency anemia.  After review the labs, review the records, discussion with the patient the findings are most consistent with an iron deficiency anemia of unclear etiology.  Given her advanced age, and hospice status I would recommend that we hold on performing GI evaluation as it may introduce unnecessary risk and harm  to the patient.  I would be happy to help by administering IV iron to the patient given the minimal risks involved with that procedure.  She has tried p.o. iron before in the past and has made her sick to her stomach and therefore I did not think it would be a good approach moving forward.  We will administer IV iron therapy and hopefully this will help improve her symptoms of shortness of breath and fatigue  at least to a small degree.  We will plan to see her back approximately 6 weeks after administration of the IV iron.  # Iron Deficiency Anemia of Unclear Etiology --given the patient's advance age and hospice status, I would agree that GI intervention to look for a source of bleeding would be unnecessary risk/harm.  --it is unclear if improving her iron deficiency will help with her fatigue and shortness of breath. Given the minimal risks involved with IV iron therapy I would happily give this a try if it may provide some comfort to the patient --recommend IV feraheme 510mg  q 7 days x 2 doses.  --we will have the patient return in 6 weeks after her last dose of IV iron to determine if the therapy has been effective.   No orders of the defined types were placed in this encounter.   All questions were answered. The patient knows to call the clinic with any problems, questions or concerns.  A total of more than 45 minutes were spent on this encounter and over half of that time was spent on counseling and coordination of care as outlined above.   , MD Department of Hematology/Oncology [facility] [place] Phone: [phone] Pager: [phone] Email: [email or web address]  08/31/2020 6:39 PM

## 2020-09-01 ENCOUNTER — Inpatient Hospital Stay
Payer: Federal, State, Local not specified - PPO | Attending: Hematology and Oncology | Admitting: Hematology and Oncology

## 2020-09-01 ENCOUNTER — Inpatient Hospital Stay: Payer: Federal, State, Local not specified - PPO

## 2020-09-01 ENCOUNTER — Other Ambulatory Visit: Payer: Self-pay

## 2020-09-01 ENCOUNTER — Encounter: Payer: Self-pay | Admitting: Hematology and Oncology

## 2020-09-01 VITALS — BP 139/74 | HR 72 | Temp 98.1°F | Resp 18 | Ht 65.0 in | Wt 112.2 lb

## 2020-09-01 DIAGNOSIS — Z87891 Personal history of nicotine dependence: Secondary | ICD-10-CM | POA: Diagnosis not present

## 2020-09-01 DIAGNOSIS — J9610 Chronic respiratory failure, unspecified whether with hypoxia or hypercapnia: Secondary | ICD-10-CM | POA: Insufficient documentation

## 2020-09-01 DIAGNOSIS — E538 Deficiency of other specified B group vitamins: Secondary | ICD-10-CM | POA: Insufficient documentation

## 2020-09-01 DIAGNOSIS — Z79899 Other long term (current) drug therapy: Secondary | ICD-10-CM | POA: Insufficient documentation

## 2020-09-01 DIAGNOSIS — D500 Iron deficiency anemia secondary to blood loss (chronic): Secondary | ICD-10-CM

## 2020-09-01 DIAGNOSIS — J449 Chronic obstructive pulmonary disease, unspecified: Secondary | ICD-10-CM | POA: Diagnosis not present

## 2020-09-01 DIAGNOSIS — M818 Other osteoporosis without current pathological fracture: Secondary | ICD-10-CM | POA: Diagnosis not present

## 2020-09-01 DIAGNOSIS — D509 Iron deficiency anemia, unspecified: Secondary | ICD-10-CM | POA: Insufficient documentation

## 2020-09-01 DIAGNOSIS — I110 Hypertensive heart disease with heart failure: Secondary | ICD-10-CM | POA: Diagnosis not present

## 2020-09-01 LAB — CMP (CANCER CENTER ONLY)
ALT: 10 U/L (ref 0–44)
AST: 16 U/L (ref 15–41)
Albumin: 3.4 g/dL — ABNORMAL LOW (ref 3.5–5.0)
Alkaline Phosphatase: 71 U/L (ref 38–126)
Anion gap: 6 (ref 5–15)
BUN: 14 mg/dL (ref 8–23)
CO2: 30 mmol/L (ref 22–32)
Calcium: 9.5 mg/dL (ref 8.9–10.3)
Chloride: 94 mmol/L — ABNORMAL LOW (ref 98–111)
Creatinine: 0.68 mg/dL (ref 0.44–1.00)
GFR, Est AFR Am: >60 mL/min (ref >60)
GFR, Estimated: >60 mL/min (ref >60)
Glucose, Bld: 92 mg/dL (ref 70–99)
Potassium: 4.5 mmol/L (ref 3.5–5.1)
Sodium: 130 mmol/L — ABNORMAL LOW (ref 135–145)
Total Bilirubin: 0.3 mg/dL (ref 0.3–1.2)
Total Protein: 7.1 g/dL (ref 6.5–8.1)

## 2020-09-01 LAB — CBC WITH DIFFERENTIAL (CANCER CENTER ONLY)
Abs Immature Granulocytes: 0.04 10*3/uL (ref 0.00–0.07)
Basophils Absolute: 0.1 10*3/uL (ref 0.0–0.1)
Basophils Relative: 1 %
Eosinophils Absolute: 0.6 10*3/uL — ABNORMAL HIGH (ref 0.0–0.5)
Eosinophils Relative: 7 %
HCT: 30.1 % — ABNORMAL LOW (ref 36.0–46.0)
Hemoglobin: 9.5 g/dL — ABNORMAL LOW (ref 12.0–15.0)
Immature Granulocytes: 1 %
Lymphocytes Relative: 20 %
Lymphs Abs: 1.7 10*3/uL (ref 0.7–4.0)
MCH: 28.9 pg (ref 26.0–34.0)
MCHC: 31.6 g/dL (ref 30.0–36.0)
MCV: 91.5 fL (ref 80.0–100.0)
Monocytes Absolute: 0.9 10*3/uL (ref 0.1–1.0)
Monocytes Relative: 10 %
Neutro Abs: 5.4 10*3/uL (ref 1.7–7.7)
Neutrophils Relative %: 61 %
Platelet Count: 333 10*3/uL (ref 150–400)
RBC: 3.29 MIL/uL — ABNORMAL LOW (ref 3.87–5.11)
RDW: 13.3 % (ref 11.5–15.5)
WBC Count: 8.7 10*3/uL (ref 4.0–10.5)
nRBC: 0 % (ref 0.0–0.2)

## 2020-09-01 LAB — RETIC PANEL
Immature Retic Fract: 11.9 % (ref 2.3–15.9)
RBC.: 3.28 MIL/uL — ABNORMAL LOW (ref 3.87–5.11)
Retic Count, Absolute: 57.1 10*3/uL (ref 19.0–186.0)
Retic Ct Pct: 1.7 % (ref 0.4–3.1)
Reticulocyte Hemoglobin: 33.8 pg (ref >27.9)

## 2020-09-08 ENCOUNTER — Other Ambulatory Visit: Payer: Self-pay

## 2020-09-08 ENCOUNTER — Inpatient Hospital Stay: Payer: Federal, State, Local not specified - PPO

## 2020-09-08 VITALS — BP 136/74 | HR 81 | Temp 98.6°F | Resp 16

## 2020-09-08 DIAGNOSIS — D500 Iron deficiency anemia secondary to blood loss (chronic): Secondary | ICD-10-CM

## 2020-09-08 DIAGNOSIS — D509 Iron deficiency anemia, unspecified: Secondary | ICD-10-CM | POA: Diagnosis not present

## 2020-09-08 MED ORDER — SODIUM CHLORIDE 0.9 % IV SOLN
510.0000 mg | Freq: Once | INTRAVENOUS | Status: AC
  Administered 2020-09-08: 510 mg via INTRAVENOUS
  Filled 2020-09-08: qty 510

## 2020-09-08 MED ORDER — SODIUM CHLORIDE 0.9 % IV SOLN
Freq: Once | INTRAVENOUS | Status: AC
  Filled 2020-09-08: qty 250

## 2020-09-08 NOTE — Patient Instructions (Signed)

## 2020-09-13 ENCOUNTER — Telehealth: Payer: Self-pay | Admitting: Family Medicine

## 2020-09-13 NOTE — Telephone Encounter (Signed)
°  Pt is scheduled to have a booster vaccine shot today, and she had an iron transfusion also. pt daughter would like to know if she can have a  flu shot along with the booster vaccine, please advise call daughter in law Nicholos Johns  [phone]

## 2020-09-13 NOTE — Telephone Encounter (Signed)
Spoke with Kathleen and informed her of the message below. 

## 2020-09-13 NOTE — Telephone Encounter (Signed)
She can have both together, but if she felt unwell after second covid shot or if she has difficulty with getting flu shot they could space them a couple of weeks. It would be ok though to get them together. (and also ok to get after infusion)

## 2020-09-15 ENCOUNTER — Other Ambulatory Visit: Payer: Self-pay

## 2020-09-15 ENCOUNTER — Inpatient Hospital Stay: Payer: Federal, State, Local not specified - PPO

## 2020-09-15 VITALS — BP 138/72 | HR 81 | Temp 98.1°F | Resp 16

## 2020-09-15 DIAGNOSIS — D500 Iron deficiency anemia secondary to blood loss (chronic): Secondary | ICD-10-CM

## 2020-09-15 DIAGNOSIS — D509 Iron deficiency anemia, unspecified: Secondary | ICD-10-CM | POA: Diagnosis not present

## 2020-09-15 MED ORDER — SODIUM CHLORIDE 0.9 % IV SOLN
510.0000 mg | Freq: Once | INTRAVENOUS | Status: AC
  Administered 2020-09-15: 510 mg via INTRAVENOUS
  Filled 2020-09-15: qty 510

## 2020-09-15 MED ORDER — SODIUM CHLORIDE 0.9 % IV SOLN
Freq: Once | INTRAVENOUS | Status: AC
  Filled 2020-09-15: qty 250

## 2020-09-15 NOTE — Patient Instructions (Signed)

## 2020-09-19 ENCOUNTER — Other Ambulatory Visit: Payer: Self-pay | Admitting: Family Medicine

## 2020-09-19 DIAGNOSIS — F419 Anxiety disorder, unspecified: Secondary | ICD-10-CM

## 2020-09-21 ENCOUNTER — Encounter: Payer: Self-pay | Admitting: Family Medicine

## 2020-09-21 MED ORDER — LEVOFLOXACIN 25 MG/ML PO SOLN
500.0000 mg | Freq: Every day | ORAL | 0 refills | Status: DC
Start: 2020-09-21 — End: 2020-11-17

## 2020-10-12 ENCOUNTER — Other Ambulatory Visit: Payer: Self-pay | Admitting: Hematology and Oncology

## 2020-10-12 DIAGNOSIS — D500 Iron deficiency anemia secondary to blood loss (chronic): Secondary | ICD-10-CM

## 2020-10-13 ENCOUNTER — Inpatient Hospital Stay: Payer: Federal, State, Local not specified - PPO

## 2020-10-13 ENCOUNTER — Inpatient Hospital Stay
Payer: Federal, State, Local not specified - PPO | Attending: Hematology and Oncology | Admitting: Hematology and Oncology

## 2020-10-13 ENCOUNTER — Other Ambulatory Visit: Payer: Self-pay

## 2020-10-13 VITALS — BP 160/84 | HR 85 | Temp 97.1°F | Resp 18 | Ht 65.0 in | Wt 110.3 lb

## 2020-10-13 DIAGNOSIS — Z87891 Personal history of nicotine dependence: Secondary | ICD-10-CM | POA: Insufficient documentation

## 2020-10-13 DIAGNOSIS — D509 Iron deficiency anemia, unspecified: Secondary | ICD-10-CM | POA: Insufficient documentation

## 2020-10-13 DIAGNOSIS — D500 Iron deficiency anemia secondary to blood loss (chronic): Secondary | ICD-10-CM | POA: Diagnosis not present

## 2020-10-13 DIAGNOSIS — J449 Chronic obstructive pulmonary disease, unspecified: Secondary | ICD-10-CM | POA: Insufficient documentation

## 2020-10-13 DIAGNOSIS — Z79899 Other long term (current) drug therapy: Secondary | ICD-10-CM | POA: Diagnosis not present

## 2020-10-13 LAB — CBC WITH DIFFERENTIAL (CANCER CENTER ONLY)
Abs Immature Granulocytes: 0.05 10*3/uL (ref 0.00–0.07)
Basophils Absolute: 0.1 10*3/uL (ref 0.0–0.1)
Basophils Relative: 1 %
Eosinophils Absolute: 0.4 10*3/uL (ref 0.0–0.5)
Eosinophils Relative: 4 %
HCT: 34.4 % — ABNORMAL LOW (ref 36.0–46.0)
Hemoglobin: 11 g/dL — ABNORMAL LOW (ref 12.0–15.0)
Immature Granulocytes: 1 %
Lymphocytes Relative: 15 %
Lymphs Abs: 1.6 10*3/uL (ref 0.7–4.0)
MCH: 29.7 pg (ref 26.0–34.0)
MCHC: 32 g/dL (ref 30.0–36.0)
MCV: 93 fL (ref 80.0–100.0)
Monocytes Absolute: 1 10*3/uL (ref 0.1–1.0)
Monocytes Relative: 10 %
Neutro Abs: 7.5 10*3/uL (ref 1.7–7.7)
Neutrophils Relative %: 69 %
Platelet Count: 329 10*3/uL (ref 150–400)
RBC: 3.7 MIL/uL — ABNORMAL LOW (ref 3.87–5.11)
RDW: 14.6 % (ref 11.5–15.5)
WBC Count: 10.5 10*3/uL (ref 4.0–10.5)
nRBC: 0 % (ref 0.0–0.2)

## 2020-10-13 LAB — CMP (CANCER CENTER ONLY)
ALT: 11 U/L (ref 0–44)
AST: 15 U/L (ref 15–41)
Albumin: 3.6 g/dL (ref 3.5–5.0)
Alkaline Phosphatase: 82 U/L (ref 38–126)
Anion gap: 7 (ref 5–15)
BUN: 18 mg/dL (ref 8–23)
CO2: 31 mmol/L (ref 22–32)
Calcium: 10.3 mg/dL (ref 8.9–10.3)
Chloride: 94 mmol/L — ABNORMAL LOW (ref 98–111)
Creatinine: 0.68 mg/dL (ref 0.44–1.00)
GFR, Estimated: >60 mL/min (ref >60)
Glucose, Bld: 87 mg/dL (ref 70–99)
Potassium: 4.4 mmol/L (ref 3.5–5.1)
Sodium: 132 mmol/L — ABNORMAL LOW (ref 135–145)
Total Bilirubin: 0.3 mg/dL (ref 0.3–1.2)
Total Protein: 7.2 g/dL (ref 6.5–8.1)

## 2020-10-13 LAB — RETIC PANEL
Immature Retic Fract: 4 % (ref 2.3–15.9)
RBC.: 3.62 MIL/uL — ABNORMAL LOW (ref 3.87–5.11)
Retic Count, Absolute: 59 10*3/uL (ref 19.0–186.0)
Retic Ct Pct: 1.6 % (ref 0.4–3.1)
Reticulocyte Hemoglobin: 35.6 pg (ref >27.9)

## 2020-10-14 ENCOUNTER — Encounter: Payer: Self-pay | Admitting: Hematology and Oncology

## 2020-10-14 LAB — IRON AND TIBC
Iron: 60 ug/dL (ref 41–142)
Saturation Ratios: 28 % (ref 21–57)
TIBC: 215 ug/dL — ABNORMAL LOW (ref 236–444)
UIBC: 155 ug/dL (ref 120–384)

## 2020-10-14 LAB — FERRITIN: Ferritin: 494 ng/mL — ABNORMAL HIGH (ref 11–307)

## 2020-10-14 NOTE — Progress Notes (Signed)
[facility] [phone]   [phone]  PROGRESS NOTE  Patient Care Team: Wynn BankerKoberlein, Junell C, MD as PCP - General (Family Medicine)  Hematological/Oncological History # Iron Deficiency Anemia of Unclear Etiology 1) 08/13/2020: WBC 9.8, Hgb 9.7, MCV 90.1, Plt 323. Iron 38, TIBC 373, Ferritin 19, Tsat 10% 2) 09/01/2020: establish care with Dr. Leonides Schanzorsey 3) 09/08/2020-09/15/2020: infusion of IV feraheme 510mg  x 2 doses  Interval History:  Sharon [name] 86 y.o. female with medical history significant for iron deficiency anemia complicated COPD who presents for a follow up visit. The patient's last visit was on 09/01/2020 at which time she established care. In the interim since the last visit she has received 2 doses of IV Feraheme.   On exam today Sharon [name] is accompanied by her son.  She reports unfortunately that she is not feeling any better than her last visit.  She reports no improvement in her shortness of breath, her energy levels, or any of her other symptoms.  Her hemoglobin has risen significantly up to 11.0 from 9.5 after the IV iron therapy.  She denies having any overt signs of bleeding such as nosebleeds, dark stools, or blood in the urine.  She does have some bruising on her extremities but no massive hematomas.  She denies any fevers, chills, sweats, nausea, vomiting or diarrhea.  A full 10 point ROS is listed below.  MEDICAL HISTORY:  Past Medical History:  Diagnosis Date  . Anemia   . B12 DEFICIENCY 03/31/2008  . COPD 11/27/2008  . ESOPHAGEAL STRICTURE 01/29/2009  . MASS, LUNG 10/13/2009  . NASAL FRACTURE 08/12/2009  . OSTEOARTHRITIS 03/31/2008   Hips  . OSTEOPOROSIS 12/27/2007  . PEDAL EDEMA 12/27/2007  . Pneumonia 07/10/2016  . PULMONARY NODULE 10/21/2009  . RESPIRATORY FAILURE, CHRONIC 06/29/2010  . Shortness of breath dyspnea     SURGICAL HISTORY: Past Surgical History:  Procedure Laterality Date  . APPENDECTOMY     '38  . BALLOON  DILATION N/A 05/10/2015   Procedure: BALLOON DILATION;  Surgeon: Louis Meckelobert D Kaplan, MD;  [facility];  Service: Endoscopy;  Laterality: N/A;  . BALLOON DILATION N/A 08/02/2015   Procedure: BALLOON DILATION;  Surgeon: Louis Meckelobert D Kaplan, MD;  Location: [facility];  Service: Endoscopy;  Laterality: N/A;  . BREAST SURGERY     biopsy '47  . CATARACT EXTRACTION Bilateral    last '05  . CHOLECYSTECTOMY     laparoscopic'10  . ESOPHAGOGASTRODUODENOSCOPY     with dilatation x3  . ESOPHAGOGASTRODUODENOSCOPY (EGD) WITH PROPOFOL N/A 05/10/2015   Procedure: ESOPHAGOGASTRODUODENOSCOPY (EGD) WITH PROPOFOL;  Surgeon: Louis Meckelobert D Kaplan, MD;  Location: [facility];  Service: Endoscopy;  Laterality: N/A;  balloon dilation  . ESOPHAGOGASTRODUODENOSCOPY (EGD) WITH PROPOFOL N/A 08/02/2015   Procedure: ESOPHAGOGASTRODUODENOSCOPY (EGD) WITH PROPOFOL;  Surgeon: Louis Meckelobert D Kaplan, MD;  Location: [facility];  Service: Endoscopy;  Laterality: N/A;  . LUMBAR LAMINECTOMY    . NASAL RECONSTRUCTION WITH SEPTAL REPAIR     '10  . RECTAL PROLAPSE REPAIR     '06  . skin graf     '98    SOCIAL HISTORY: Social History   Socioeconomic History  . Marital status: Widowed    Spouse name: Not on file  . Number of children: Not on file  . Years of education: Not on file  . Highest education level: Not on file  Occupational History  . Not on file  Tobacco Use  . Smoking status: Former Smoker  Packs/day: 0.50    Years: 35.00    Pack years: 17.50    Quit date: 12/18/1978    Years since quitting: 41.8  . Smokeless tobacco: Never Used  Vaping Use  . Vaping Use: Never used  Substance and Sexual Activity  . Alcohol use: Yes    Alcohol/week: 0.0 standard drinks    Comment: couple of gin and tonics daily  . Drug use: No  . Sexual activity: Not on file  Other Topics Concern  . Not on file  Social History Narrative  . Not on file   Social Determinants of Health   Financial Resource Strain:   . Difficulty of  Paying Living Expenses: Not on file  Food Insecurity:   . Worried About Programme researcher, broadcasting/film/video in the Last Year: Not on file  . Ran Out of Food in the Last Year: Not on file  Transportation Needs:   . Lack of Transportation (Medical): Not on file  . Lack of Transportation (Non-Medical): Not on file  Physical Activity:   . Days of Exercise per Week: Not on file  . Minutes of Exercise per Session: Not on file  Stress:   . Feeling of Stress : Not on file  Social Connections:   . Frequency of Communication with Friends and Family: Not on file  . Frequency of Social Gatherings with Friends and Family: Not on file  . Attends Religious Services: Not on file  . Active Member of Clubs or Organizations: Not on file  . Attends Banker Meetings: Not on file  . Marital Status: Not on file  Intimate Partner Violence:   . Fear of Current or Ex-Partner: Not on file  . Emotionally Abused: Not on file  . Physically Abused: Not on file  . Sexually Abused: Not on file    FAMILY HISTORY: Family History  Problem Relation Age of Onset  . Hypertension Son   . Asthma Mother   . Abdominal Wall Hernia Mother   . Stomach cancer Mother     ALLERGIES:  is allergic to penicillins.  MEDICATIONS:  Current Outpatient Medications  Medication Sig Dispense Refill  . ALPRAZolam (XANAX) 0.25 MG tablet TAKE 1 TABLET BY MOUTH TWICE DAILY AS NEEDED FOR ANXIETY 60 tablet 2  . albuterol (PROVENTIL) (2.5 MG/3ML) 0.083% nebulizer solution Take 3 mLs (2.5 mg total) by nebulization every 4 (four) hours as needed for wheezing or shortness of breath. 150 mL 5  . Cholecalciferol 50000 units TABS Take 5,000 Units by mouth.    . cyanocobalamin (,VITAMIN B-12,) 1000 MCG/ML injection INJECT 1 ML IN THE MUSCLE MONTHLY 3 mL 3  . Dextromethorphan-guaiFENesin (MUCINEX FAST-MAX DM MAX) 5-100 MG/5ML LIQD Take 20 mLs by mouth 4 (four) times daily as needed (cough, congestion). 1775 mL 12  . escitalopram (LEXAPRO) 5 MG  tablet TAKE 1 TABLET(5 MG) BY MOUTH DAILY 90 tablet 1  . furosemide (LASIX) 20 MG tablet Take 1 tablet (20 mg total) by mouth daily as needed. 30 tablet 2  . HYDROcodone-acetaminophen (HYCET) 7.5-325 mg/15 ml solution Take 5-10 mLs by mouth 2 (two) times daily as needed for severe pain. 120 mL 0  . levofloxacin (LEVAQUIN) 25 MG/ML solution Take 20 mLs (500 mg total) by mouth daily. 140 mL 0  . mirtazapine (REMERON) 15 MG tablet TAKE 1 TABLET(15 MG) BY MOUTH AT BEDTIME 90 tablet 1  . mometasone-formoterol (DULERA) 200-5 MCG/ACT AERO INHALE 2 PUFFS BY MOUTH FIRST THING IN MORNING AND THEN ANOTHER 2 PUFFS  ABOUT 12 HOURS LATER 13 g 2  . Multiple Vitamins-Minerals (ICAPS AREDS 2 PO) Take 1 tablet by mouth daily.    . mupirocin ointment (BACTROBAN) 2 % Apply 1 application topically 2 (two) times daily. 30 g 2  . naproxen sodium (ALEVE) 220 MG tablet Take 220 mg by mouth as needed.    . OXYGEN Oxygen 3lpm 24/7    . pantoprazole (PROTONIX) 40 MG tablet Take 1 tablet (40 mg total) by mouth daily. Take 30-60 min before first meal of the day 90 tablet 3  . Polyethyl Glycol-Propyl Glycol (SYSTANE OP) Place 1 drop into both eyes daily.     . polyethylene glycol powder (GLYCOLAX/MIRALAX) 17 GM/SCOOP powder DISSOLVE 17 GRAMS(1 SCOOP) IN 6-8 OZ OF FLUID AND DRINK BY MOUTH ONCE DAILY AS NEEDED FOR CONSTIPATION 238 g 2  . Tiotropium Bromide Monohydrate (SPIRIVA RESPIMAT) 2.5 MCG/ACT AERS Inhale 2 puffs into the lungs daily. 4 g 2  . triamcinolone (NASACORT) 55 MCG/ACT AERO nasal inhaler Place 2 sprays into the nose daily. 1 Inhaler 12  . Wheat Dextrin (BENEFIBER) POWD Take 1 Dose by mouth as needed.     No current facility-administered medications for this visit.    REVIEW OF SYSTEMS:   Constitutional: ( - ) fevers, ( - )  chills , ( - ) night sweats Eyes: ( - ) blurriness of vision, ( - ) double vision, ( - ) watery eyes Ears, nose, mouth, throat, and face: ( - ) mucositis, ( - ) sore throat Respiratory: ( -  ) cough, ( - ) dyspnea, ( - ) wheezes Cardiovascular: ( - ) palpitation, ( - ) chest discomfort, ( - ) lower extremity swelling Gastrointestinal:  ( - ) nausea, ( - ) heartburn, ( - ) change in bowel habits Skin: ( - ) abnormal skin rashes Lymphatics: ( - ) new lymphadenopathy, ( - ) easy bruising Neurological: ( - ) numbness, ( - ) tingling, ( - ) new weaknesses Behavioral/Psych: ( - ) mood change, ( - ) new changes  All other systems were reviewed with the patient and are negative.  PHYSICAL EXAMINATION: ECOG PERFORMANCE STATUS: 3 - Symptomatic, >50% confined to bed  Vitals:   10/13/20 1532  BP: (!) 160/84  Pulse: 85  Resp: 18  Temp: (!) 97.1 F (36.2 C)  SpO2: 100%   Filed Weights   10/13/20 1532  Weight: 110 lb 4.8 oz (50 kg)    GENERAL: chronically ill appearing elderly Caucasian female in NAD. alert, no distress and comfortable SKIN: skin color, texture, turgor are normal, no rashes or significant lesions EYES: conjunctiva are pink and non-injected, sclera clear LUNGS: diminished breath sounds bilaterally, otherwise clear to auscultation and percussion with normal breathing effort HEART: regular rate & rhythm and no murmurs and no lower extremity edema Musculoskeletal: no cyanosis of digits and no clubbing  PSYCH: alert & oriented x 3, fluent speech NEURO: no focal motor/sensory deficits  LABORATORY DATA:  I have reviewed the data as listed CBC Latest Ref Rng & Units 10/13/2020 09/01/2020 08/13/2020  WBC 4.0 - 10.5 K/uL 10.5 8.7 9.8  Hemoglobin 12.0 - 15.0 g/dL 11.0(L) 9.5(L) 9.7(L)  Hematocrit 36 - 46 % 34.4(L) 30.1(L) 30.2(L)  Platelets 150 - 400 K/uL 329 333 323    CMP Latest Ref Rng & Units 10/13/2020 09/01/2020 05/07/2020  Glucose 70 - 99 mg/dL 87 92 94  BUN 8 - 23 mg/dL 18 14 13   Creatinine 0.44 - 1.00 mg/dL 3.15 1.76)  Sodium 135 - 145 mmol/L 132(L) 130(L) 131(L)  Potassium 3.5 - 5.1 mmol/L 4.4 4.5 4.7  Chloride 98 - 111 mmol/L 94(L) 94(L) 93(L)  CO2  22 - 32 mmol/L 31 30 31   Calcium 8.9 - 10.3 mg/dL 9.5 9.5  Total Protein 6.5 - 8.1 g/dL 7.2 7.1 6.3  Total Bilirubin 0.3 - 1.2 mg/dL 0.3 0.3 0.3  Alkaline Phos 38 - 126 U/L 82 71 -  AST 15 - 41 U/L 15 16 14   ALT 0 - 44 U/L 11 10 10     RADIOGRAPHIC STUDIES: No results found.  ASSESSMENT & PLAN KIMYATTA LECY 86 y.o. female with medical history significant for iron deficiency anemia complicated COPD who presents for a follow up visit.  Patient received 2 doses of IV Feraheme in the interim since her last visit, but unfortunately she has remained symptomatic with no improvement in her symptoms despite the boost in hemoglobin.  As such I do believe that her fatigue and shortness of breath are almost entirely related to her COPD and not connected to her iron deficiency anemia.  We do have iron labs pending which could show if she has replete iron stores.  However at this time I do not think it is unlikely that she would receive benefit from further IV iron therapy.  # Iron Deficiency Anemia of Unclear Etiology --given the patient's advance age and hospice status, I would agree that GI intervention to look for a source of bleeding would be unnecessary risk/harm.  --it was unclear if improving her iron deficiency would help with her fatigue and shortness of breath. Given the minimal risks involved with IV iron therapy we gave this a try to see if it may provide some comfort to the patient --she received IV feraheme 510mg  q 7 days x 2 doses in late September 2021.  --Unfortunately there has been no symptomatic improvement with the boosted iron levels and improved hemoglobin. --awaiting final results of iron studies. If no benefit from first round of iron it is unlikely that further iron will help her symptoms. --RTC PRN.    No orders of the defined types were placed in this encounter.   All questions were answered. The patient knows to call the clinic with any problems, questions or  concerns.  A total of more than 30 minutes were spent on this encounter and over half of that time was spent on counseling and coordination of care as outlined above.   92, MD Department of Hematology/Oncology [facility] Phone: [phone] Pager: [phone] Email: [email or web address]  10/14/2020 8:33 AM

## 2020-11-17 ENCOUNTER — Other Ambulatory Visit: Payer: Self-pay

## 2020-11-17 ENCOUNTER — Encounter: Payer: Self-pay | Admitting: Family Medicine

## 2020-11-17 ENCOUNTER — Ambulatory Visit: Payer: Federal, State, Local not specified - PPO | Admitting: Family Medicine

## 2020-11-17 VITALS — BP 112/66 | HR 74 | Temp 97.9°F | Ht 65.0 in | Wt 109.2 lb

## 2020-11-17 DIAGNOSIS — J449 Chronic obstructive pulmonary disease, unspecified: Secondary | ICD-10-CM

## 2020-11-17 DIAGNOSIS — I480 Paroxysmal atrial fibrillation: Secondary | ICD-10-CM

## 2020-11-17 DIAGNOSIS — L03115 Cellulitis of right lower limb: Secondary | ICD-10-CM

## 2020-11-17 DIAGNOSIS — J9611 Chronic respiratory failure with hypoxia: Secondary | ICD-10-CM

## 2020-11-17 DIAGNOSIS — R600 Localized edema: Secondary | ICD-10-CM

## 2020-11-17 MED ORDER — CEPHALEXIN 250 MG/5ML PO SUSR: 500.0000 mg | Freq: Three times a day (TID) | ORAL | 0 refills | Status: DC

## 2020-11-17 NOTE — Progress Notes (Signed)
Sharon [name] DOB: Jul 11, 1923 Encounter date: 11/17/2020  This is a 86 y.o. female who presents with Chief Complaint  Patient presents with  . Follow-up    History of present illness: Breathing feels ok right now; she is surprised by reading today. If up moving from bathroom to bathroom - turn up O2 to 6L and takes a couple of minutes to get back to 90's. Will drop to 80's with any activity. Currently on 3L; which is about normal; sometimes 4L.   Right leg is swollen and red again. Went through period of peeling a lot a couple of months ago; got it all cleared up and then started with cetaphil on it rather than medication (steroid from derm). In last 2 days more redness and more swelling at night.   Has a lot of pain with bp checking - 112/66 for me today; usually in the 120's systolic.   HR usually in 100's if active, usually 70's with resting.    Allergies  Allergen Reactions  . Penicillins Rash    Has patient had a PCN reaction causing immediate rash, facial/tongue/throat swelling, SOB or lightheadedness with hypotension: Yes Has patient had a PCN reaction causing severe rash involving mucus membranes or skin necrosis: Unknown Has patient had a PCN reaction that required hospitalization: Unknown Has patient had a PCN reaction occurring within the last 10 years: No If all of the above answers are "NO", then may proceed with Cephalosporin use.    Current Meds  Medication Sig  . albuterol (PROVENTIL) (2.5 MG/3ML) 0.083% nebulizer solution Take 3 mLs (2.5 mg total) by nebulization every 4 (four) hours as needed for wheezing or shortness of breath.  . ALPRAZolam (XANAX) 0.25 MG tablet TAKE 1 TABLET BY MOUTH TWICE DAILY AS NEEDED FOR ANXIETY  . Cholecalciferol 50000 units TABS Take 5,000 Units by mouth.  . cyanocobalamin (,VITAMIN B-12,) 1000 MCG/ML injection INJECT 1 ML IN THE MUSCLE MONTHLY  . Dextromethorphan-guaiFENesin (MUCINEX FAST-MAX DM MAX) 5-100 MG/5ML LIQD Take 20 mLs by  mouth 4 (four) times daily as needed (cough, congestion).  Marland Kitchen escitalopram (LEXAPRO) 5 MG tablet TAKE 1 TABLET(5 MG) BY MOUTH DAILY  . furosemide (LASIX) 20 MG tablet Take 1 tablet (20 mg total) by mouth daily as needed.  Marland Kitchen HYDROcodone-acetaminophen (HYCET) 7.5-325 mg/15 ml solution Take 5-10 mLs by mouth 2 (two) times daily as needed for severe pain.  . mirtazapine (REMERON) 15 MG tablet TAKE 1 TABLET(15 MG) BY MOUTH AT BEDTIME  . mometasone-formoterol (DULERA) 200-5 MCG/ACT AERO INHALE 2 PUFFS BY MOUTH FIRST THING IN MORNING AND THEN ANOTHER 2 PUFFS ABOUT 12 HOURS LATER  . Multiple Vitamins-Minerals (ICAPS AREDS 2 PO) Take 1 tablet by mouth daily.  . mupirocin ointment (BACTROBAN) 2 % Apply 1 application topically 2 (two) times daily.  . naproxen sodium (ALEVE) 220 MG tablet Take 220 mg by mouth as needed.  . OXYGEN Oxygen 3lpm 24/7  . pantoprazole (PROTONIX) 40 MG tablet Take 1 tablet (40 mg total) by mouth daily. Take 30-60 min before first meal of the day  . Polyethyl Glycol-Propyl Glycol (SYSTANE OP) Place 1 drop into both eyes daily.   . polyethylene glycol powder (GLYCOLAX/MIRALAX) 17 GM/SCOOP powder DISSOLVE 17 GRAMS(1 SCOOP) IN 6-8 OZ OF FLUID AND DRINK BY MOUTH ONCE DAILY AS NEEDED FOR CONSTIPATION  . Tiotropium Bromide Monohydrate (SPIRIVA RESPIMAT) 2.5 MCG/ACT AERS Inhale 2 puffs into the lungs daily.  Marland Kitchen triamcinolone (NASACORT) 55 MCG/ACT AERO nasal inhaler Place 2 sprays into the nose  daily.  . Wheat Dextrin (BENEFIBER) POWD Take 1 Dose by mouth as needed.  . [DISCONTINUED] levofloxacin (LEVAQUIN) 25 MG/ML solution Take 20 mLs (500 mg total) by mouth daily.    Review of Systems  Constitutional: Positive for fatigue. Negative for chills and fever.  Respiratory: Positive for cough, shortness of breath and wheezing. Negative for chest tightness.   Cardiovascular: Positive for leg swelling. Negative for chest pain and palpitations.    Objective:  BP 112/66   Pulse 74   Temp  97.9 F (36.6 C) (Oral)   Ht 5\' 5"  (1.651 m)   Wt 109 lb 3.2 oz (49.5 kg)   SpO2 94%   BMI 18.17 kg/m   Weight: 109 lb 3.2 oz (49.5 kg)   BP Readings from Last 3 Encounters:  11/17/20 112/66  10/13/20 (!) 160/84  09/15/20 138/72   Wt Readings from Last 3 Encounters:  11/17/20 109 lb 3.2 oz (49.5 kg)  10/13/20 110 lb 4.8 oz (50 kg)  09/01/20 112 lb 3.2 oz (50.9 kg)    Physical Exam Constitutional:      General: She is not in acute distress.    Appearance: She is well-developed.  Cardiovascular:     Rate and Rhythm: Normal rate and regular rhythm.     Heart sounds: Normal heart sounds. No murmur heard.  No friction rub.  Pulmonary:     Effort: Pulmonary effort is normal. No respiratory distress.     Breath sounds: Decreased air movement present. Decreased breath sounds and wheezing (exp wheeze at bases) present. No rales.  Musculoskeletal:     Right lower leg: No edema.     Left lower leg: No edema.  Skin:    Comments: See images.  She has some 2+ pitting edema bilateral lower extremities.  Right lower extremity is warm, with some blistering, and erythema.  Neurological:     Mental Status: She is alert and oriented to person, place, and time.  Psychiatric:        Behavior: Behavior normal.         Assessment/Plan  1. Cellulitis of right lower extremity Encouraged her to continue to elevate lower extremities, continue to use Lasix.  Start Keflex.  We will check in on Monday to make sure she is getting some improvement in erythema and discomfort.  2. Paroxysmal atrial fibrillation (HCC) Heart rate stable today.  Not currently on anticoagulation due to fall risk.  3. COPD GOLD III  Continues to require oxygen.  Increased demand with any sort of activity (see HPI).  She does follow regularly with pulmonology.  Unfortunately, she did not get any improvement in her overall energy level/breathing with iron infusions.  4. Chronic respiratory failure with hypoxia  (HCC) See above.  Continue with oxygen.  5. Pedal edema Elevation, Lasix 20 mg daily as needed.  We will continue to monitor.   Return in about 3 months (around 02/15/2021) for Chronic condition visit.    04/17/2021, MD

## 2020-11-21 ENCOUNTER — Encounter: Payer: Self-pay | Admitting: Family Medicine

## 2020-11-23 ENCOUNTER — Telehealth: Payer: Self-pay | Admitting: *Deleted

## 2020-11-23 NOTE — Telephone Encounter (Signed)
-----   Message from Wynn Banker, MD sent at 11/21/2020  4:57 PM EST ----- Can you please check in with her on Monday and make sure that leg is looking better on antibiotic treatment?

## 2020-11-23 NOTE — Telephone Encounter (Signed)
Spoke with the pt and she stated her leg looks about the same, does not feel the antibiotic has helped with recurrent swelling from ankle to knee.  Also stated she has an appt with the dermatologist tomorrow.  Message sent to PCP.

## 2020-11-24 NOTE — Telephone Encounter (Signed)
Patient and her daughter in law were informed of the message below.

## 2020-11-24 NOTE — Telephone Encounter (Signed)
Since she has visit with derm; I won't change antibiotic at this point, BUT; would suggest that she take an extra dose of lasix today in early afternoon if possible to help get more fluid out.

## 2020-11-30 IMAGING — DX LEFT RIBS AND CHEST - 3+ VIEW
3 series · 3 of 3 positions shown · non-contrast
Comparison: Chest radiograph

CLINICAL DATA: Severe left chest pain after cough. Rule out rib
fracture.

EXAM:
LEFT RIBS AND CHEST - 3+ VIEW

[chest pa]
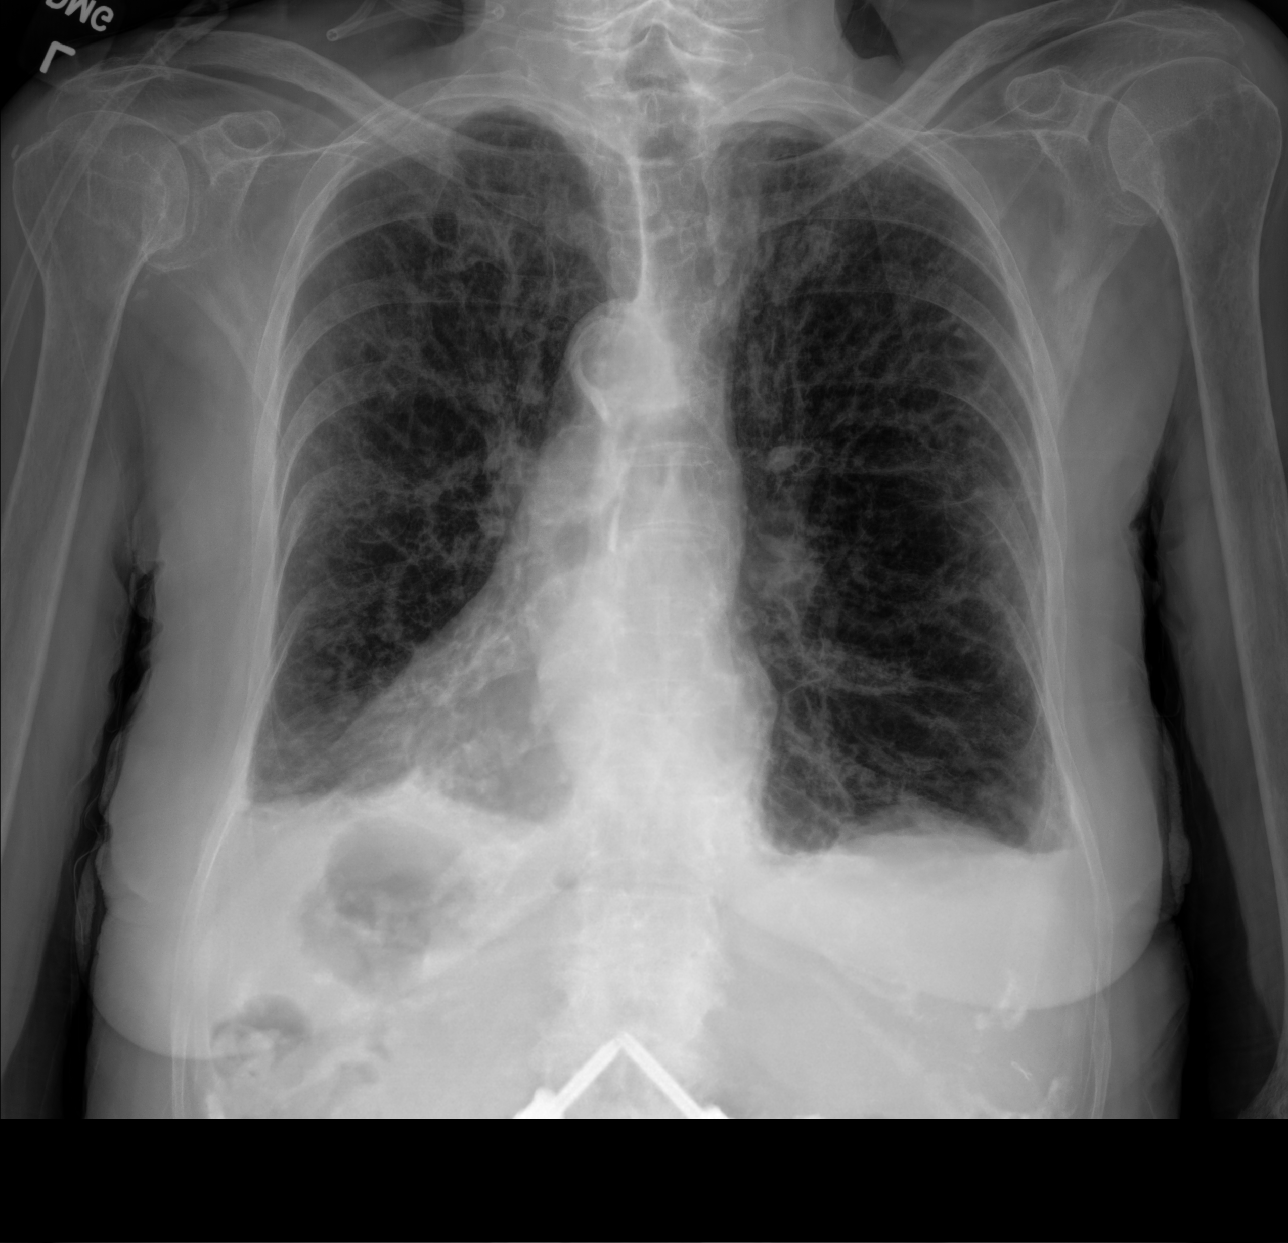

[hemithorax (ribs) ap (1 of 2)]
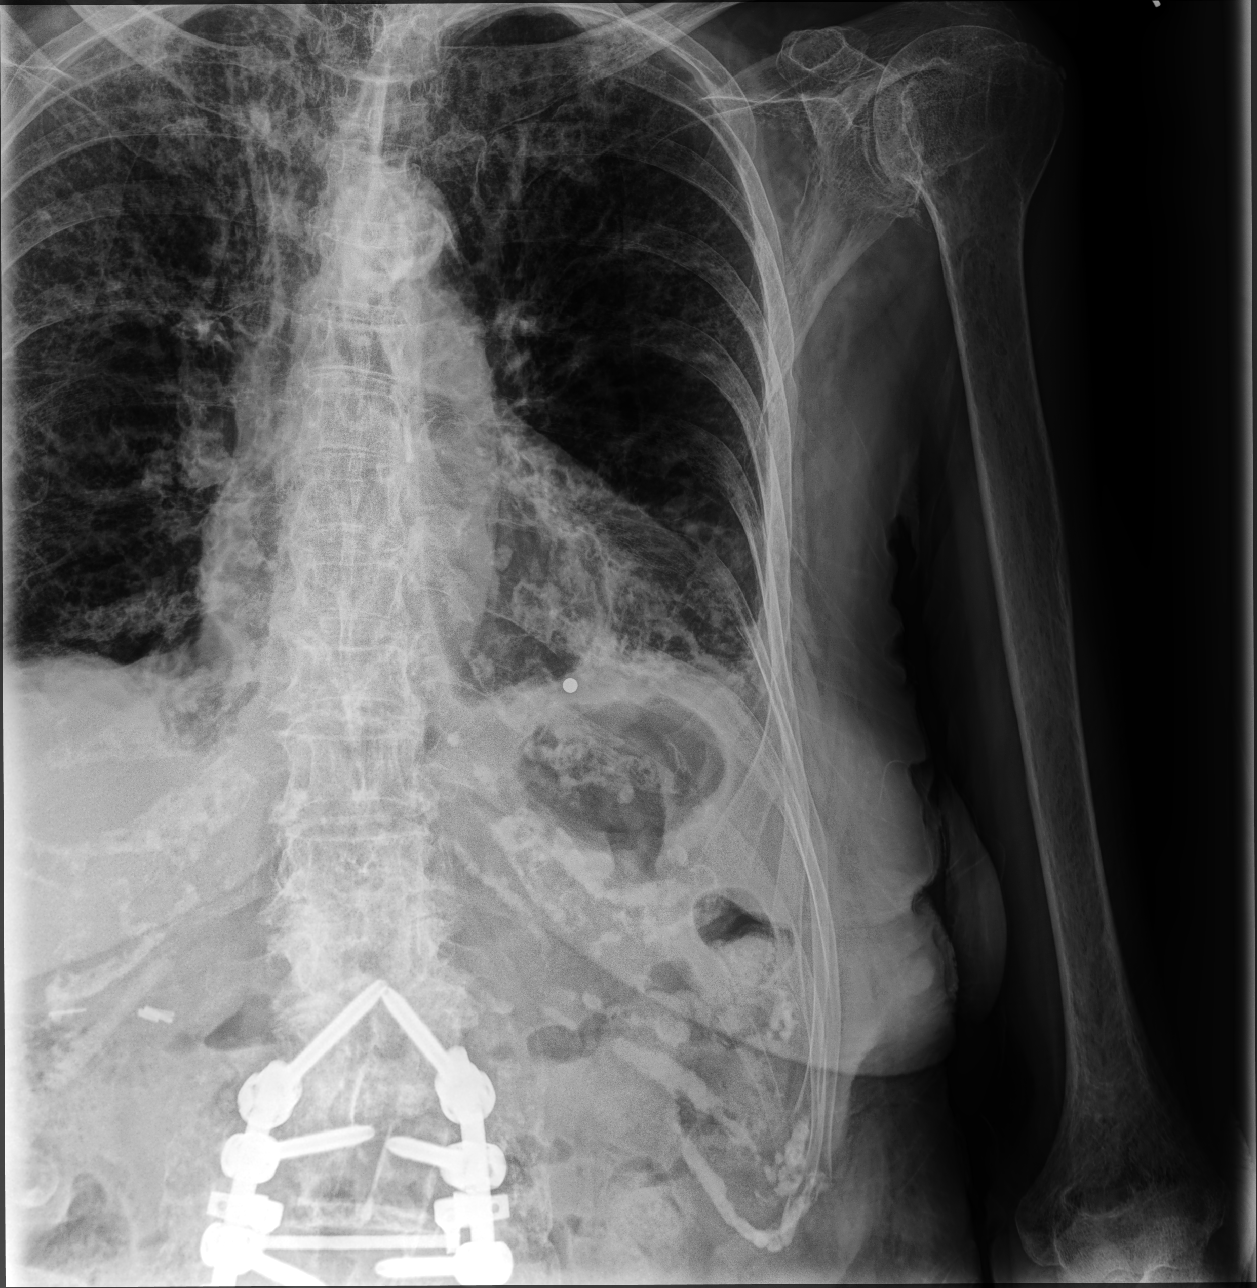

[hemithorax (ribs) ap (2 of 2)]
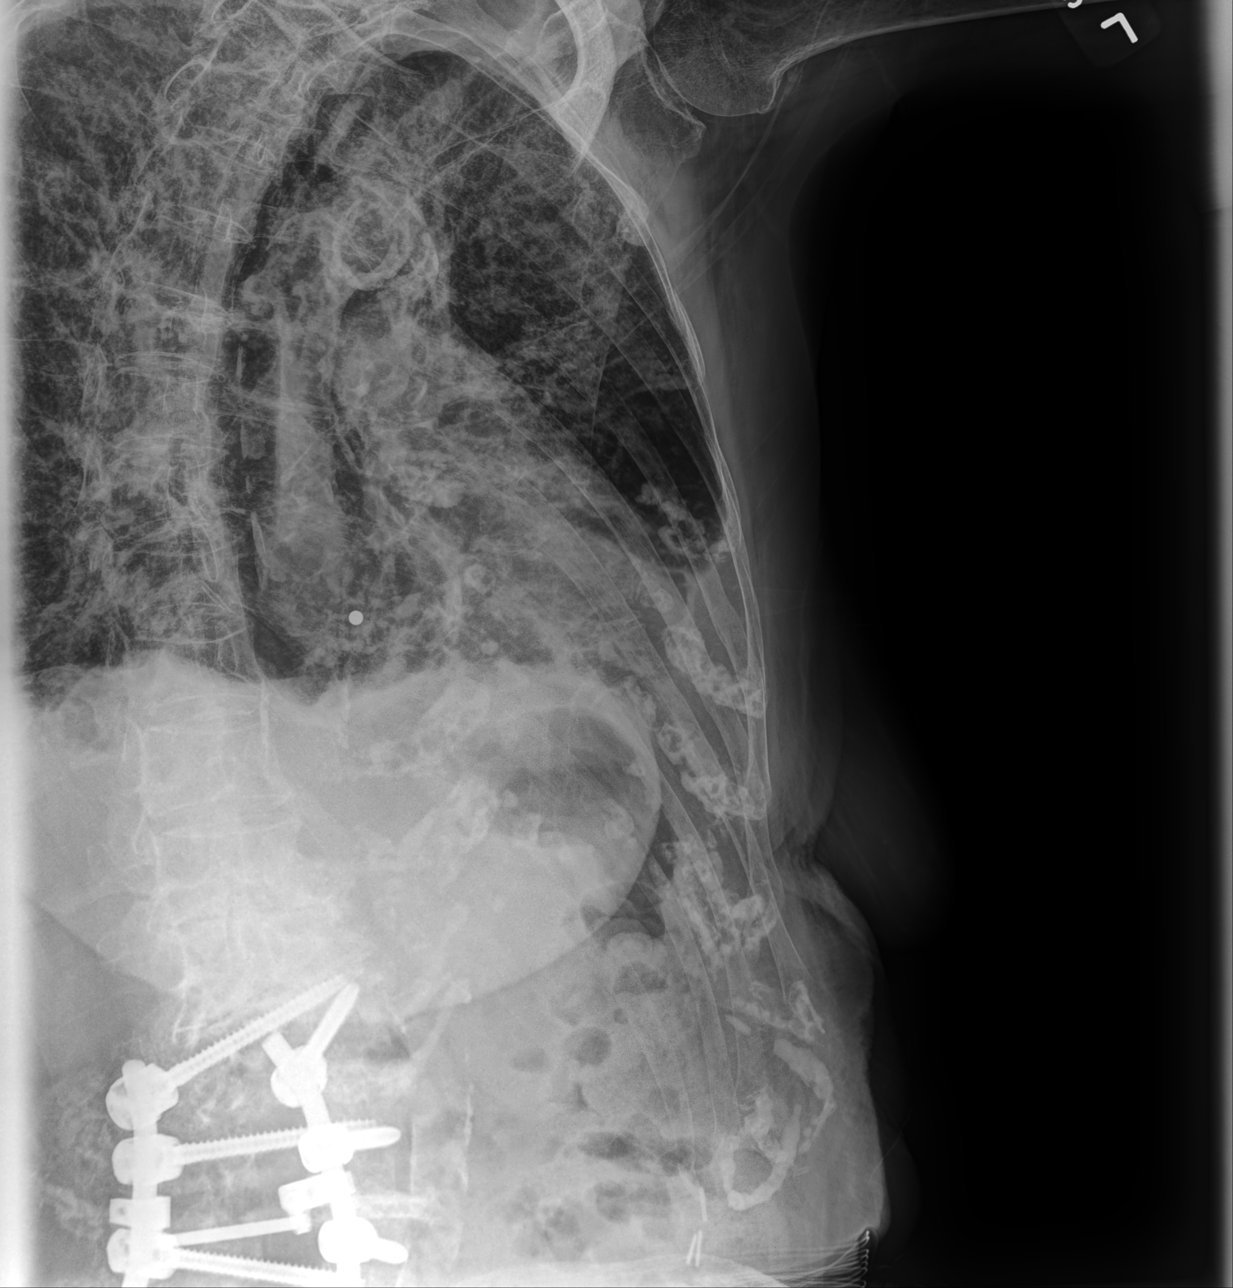

[3 of 3 positions shown; findings below may reference images not displayed]

FINDINGS: Coarse lung markings compatible with chronic changes. Chronic
blunting at the costophrenic angles. Heart and mediastinum are
stable within normal limits. Atherosclerotic calcifications at the
aortic arch. Negative for a pneumothorax. Evidence for old left rib
fractures. Pedicle screw and rod fixation in the lumbar spine.
Marker was placed in the posterior left chest area. Negative for
acute displaced rib fracture.
IMPRESSION: 1. Chronic lung changes without acute findings. Negative for
pneumothorax.
2. Old left rib fractures. No evidence for an acute left rib
fracture.

## 2020-12-08 ENCOUNTER — Other Ambulatory Visit: Payer: Self-pay | Admitting: Family Medicine

## 2020-12-08 DIAGNOSIS — F419 Anxiety disorder, unspecified: Secondary | ICD-10-CM

## 2020-12-16 ENCOUNTER — Other Ambulatory Visit: Payer: Self-pay | Admitting: Internal Medicine

## 2021-01-11 ENCOUNTER — Other Ambulatory Visit: Payer: Self-pay | Admitting: Primary Care

## 2021-01-20 ENCOUNTER — Telehealth: Payer: Self-pay | Admitting: *Deleted

## 2021-01-20 MED ORDER — PREDNISONE 10 MG PO TABS: ORAL_TABLET | ORAL | 0 refills | Status: DC

## 2021-01-20 MED ORDER — LEVOFLOXACIN 25 MG/ML PO SOLN: 750.0000 mg | Freq: Every day | ORAL | 0 refills | Status: AC

## 2021-01-20 NOTE — Telephone Encounter (Signed)
Received a message from Hospice RN   Littie Deeds, nurse with [facility] [phone]) called stating the pt has increased congestion, cough, weakness and inability to stand, decreased appetite, shortness of breath with exertion and denies a fever for the past 2 days.  States the family has increased oxygen at night from 4 units to 8 units even while getting up to go the restroom. [name] stated the pt has not been tested for Covid as the family denies exposure.  Message sent to [facility] as PCP is out of the office.      Spoke to Volo and she informed me that the patient has diminished breath sounds normally they sound very rhonchorous.  In the past they have done liquid Levaquin x10 days which seems to help.  We will send this prescription and as well as some prednisone to treat for COPD flare and cover for possible pneumonia

## 2021-01-20 NOTE — Telephone Encounter (Signed)
Sharon [name], nurse with [facility] [phone]) called stating the pt has increased congestion, cough, weakness and inability to stand, decreased appetite, shortness of breath with exertion and denies a fever for the past 2 days.  States the family has increased oxygen at night from 4 units to 8 units even while getting up to go the restroom. Sharon [name] stated the pt has not been tested for Covid as the family denies exposure.  Message sent to [facility] as PCP is out of the office.

## 2021-02-04 ENCOUNTER — Telehealth: Payer: Self-pay | Admitting: Family Medicine

## 2021-02-04 NOTE — Telephone Encounter (Signed)
Please check with patient. If she feels better on prednisone and would like to be on low maint. Dose we could do this, but I want to make sure this is what she wants since there are side effects with prednisone like anxiety, stomach irritation, etc. Low dose may help with baseline breathing; so would be reasonable at this stage of her COPD. Just let me know what she thinks. She has taken prednisone enough to know how she feels on it.

## 2021-02-04 NOTE — Telephone Encounter (Signed)
Sharon [name] from [facility] call and stated pt have been re certified and want to do a maintenance dose of Prednisone. [name]'s # is [phone].

## 2021-02-04 NOTE — Telephone Encounter (Signed)
I was only aware of tapers of prednisone in the past. Was she taking a daily dose? Is this from pulmonology?

## 2021-02-04 NOTE — Telephone Encounter (Signed)
Spoke with [name] and informed her of the message below.  [name] stated the pt was given a Prednisone taper by another provider that was covering for her.  Stated the Hospice provider, Dr Anne Fu wanted to know if Dr Hassan Rowan felt comfortable giving the pt a maintenance dose?  Message sent to PCP.

## 2021-02-07 ENCOUNTER — Other Ambulatory Visit: Payer: Self-pay | Admitting: Family Medicine

## 2021-02-07 ENCOUNTER — Telehealth: Payer: Self-pay | Admitting: Family Medicine

## 2021-02-07 MED ORDER — PREDNISONE 5 MG PO TABS: 5.0000 mg | ORAL_TABLET | Freq: Every day | ORAL | 0 refills | Status: DC

## 2021-02-07 NOTE — Telephone Encounter (Signed)
Spoke with the pts son and informed him of the message below as he stated the pt is not available at this time.  Daughter in law stated she feels the pt would want to try this as she has had "very low energy lately and needs a boost".  I advised the pts son to discuss this with the patient and call back with her preference.  Message snt to PCP.

## 2021-02-07 NOTE — Telephone Encounter (Signed)
Ok. I have sent in prednisone 5mg  daily for her. I do think that follow up to see if she is getting benefit from medication is important to see if it is worth continuing. She can follow up with Dr. or myself.

## 2021-02-07 NOTE — Telephone Encounter (Signed)
Left a message for the pts daughter in law to return my call.

## 2021-02-07 NOTE — Telephone Encounter (Signed)
Sharon [name] call and stated yes they want to try pt on a low dose of predisone .

## 2021-02-13 ENCOUNTER — Other Ambulatory Visit: Payer: Self-pay | Admitting: Primary Care

## 2021-02-14 ENCOUNTER — Other Ambulatory Visit: Payer: Self-pay | Admitting: Primary Care

## 2021-02-14 ENCOUNTER — Telehealth: Payer: Self-pay | Admitting: Internal Medicine

## 2021-02-14 NOTE — Telephone Encounter (Signed)
Called and spoke with pt's daughter-n-law Nicholos Johns who stated Elwin Sleight was on backorder at pt's pharmacy. Stated to Nicholos Johns that we have heard that [facility] was no longer going to be manufactured and also stated to her that we no longer carry samples of that inhaler. Stated to her that we could move up pt's current scheduled appt that is with MW in August 2022 up sooner for med management. Nicholos Johns verbalized understanding. appt rescheduled for pt to see TP tomorrow 3/1. Nothing further needed.

## 2021-02-15 ENCOUNTER — Other Ambulatory Visit: Payer: Self-pay

## 2021-02-15 ENCOUNTER — Encounter: Payer: Self-pay | Admitting: Adult Health

## 2021-02-15 ENCOUNTER — Ambulatory Visit: Payer: Federal, State, Local not specified - PPO | Admitting: Adult Health

## 2021-02-15 DIAGNOSIS — J9611 Chronic respiratory failure with hypoxia: Secondary | ICD-10-CM | POA: Diagnosis not present

## 2021-02-15 DIAGNOSIS — J9612 Chronic respiratory failure with hypercapnia: Secondary | ICD-10-CM | POA: Diagnosis not present

## 2021-02-15 DIAGNOSIS — J441 Chronic obstructive pulmonary disease with (acute) exacerbation: Secondary | ICD-10-CM

## 2021-02-15 DIAGNOSIS — E46 Unspecified protein-calorie malnutrition: Secondary | ICD-10-CM | POA: Diagnosis not present

## 2021-02-15 MED ORDER — SPIRIVA RESPIMAT 2.5 MCG/ACT IN AERS: 2.0000 | INHALATION_SPRAY | Freq: Every day | RESPIRATORY_TRACT | 12 refills | Status: DC

## 2021-02-15 MED ORDER — BUDESONIDE-FORMOTEROL FUMARATE 160-4.5 MCG/ACT IN AERO: 2.0000 | INHALATION_SPRAY | Freq: Two times a day (BID) | RESPIRATORY_TRACT | 12 refills | Status: DC

## 2021-02-15 NOTE — Patient Instructions (Signed)
Change Dulera to Symbicort 160 2 puffs Twice daily  , rinse after use  Continue on Spiriva 2 puffs daily  Flutter valve Three times a day  .  Activity as tolerated  Continue on Oxygen 4 l/m , maintain O2 sats 88-90%.  Follow up with Dr. Sherene Sires  In 4 months and As needed

## 2021-02-15 NOTE — Assessment & Plan Note (Signed)
Continue on oxygen 4 L at rest and 6 L with activity to maintain O2 saturations greater than 88 to 90%.

## 2021-02-15 NOTE — Assessment & Plan Note (Signed)
High-protein diet 

## 2021-02-15 NOTE — Progress Notes (Signed)
@Patient  ID: , female    DOB: 04-Apr-1923, 87 y.o.   MRN: [id number]  Chief Complaint  Patient presents with  . Follow-up    Referring provider: [id number], MD  HPI: 87 year old female former smoker quit 1980.  Followed for gold 3 COPD, bronchiectasis, chronic respiratory failure on oxygen 3 L Medical history significant for A. Fib and  pulmonary nodule   TEST/EVENTS :  12/03/2009-FEV1 67 (40%) with 12% response to bronchodilator and DLCO 55%   02/15/2021 Follow up : COPD and bronchiectasis, chronic respiratory failure Patient returns for a 67-month follow-up.  Patient has underlying COPD and bronchiectasis.  Is on oxygen 4 L.  Mainly uses oxygen at 4 L at rest and if she has to move around goes up to 6 L even higher at times.  Hospice follows her at home usually visits once weekly.  Recently had a flare in her COPD.  Was treated with antibiotics.  Has also been placed on low-dose prednisone by hospice at 5 mg daily. Patient says she is starting to feel some better.  She is also on Dulera twice daily and Spiriva daily.  Patient is requesting a alternative to [facility] as she has not been able to get this at the pharmacy as it is on back order.  We discussed changing Dulera to Symbicort. She says she gets short of breath with minimum activity.  Has a very low activity tolerance. Also has balance issues and needs assistance with walking.  She does have a walker but does not like it.  She does have flutter valve but does not like using it. She denies any hemoptysis chest pain orthopnea PND or increased leg swelling.   Allergies  Allergen Reactions  . Penicillins Rash    Has patient had a PCN reaction causing immediate rash, facial/tongue/throat swelling, SOB or lightheadedness with hypotension: Yes Has patient had a PCN reaction causing severe rash involving mucus membranes or skin necrosis: Unknown Has patient had a PCN reaction that required hospitalization:  Unknown Has patient had a PCN reaction occurring within the last 10 years: No If all of the above answers are "NO", then may proceed with Cephalosporin use.     Immunization History  Administered Date(s) Administered  . Fluad Quad(high Dose 65+) 08/12/2019  . Influenza Split 09/26/2011, 09/27/2012  . Influenza Whole 09/30/2009  . Influenza, High Dose Seasonal PF 09/24/2013, 01/10/2016, 09/21/2016, 09/25/2017, 09/18/2018  . Influenza-Unspecified 10/13/2014, 09/13/2020  . PFIZER(Purple Top)SARS-COV-2 Vaccination 12/29/2019, 01/19/2020, 09/13/2020  . Pneumococcal Conjugate-13 02/11/2014  . Pneumococcal Polysaccharide-23 05/05/2009  . Td 05/05/2009    Past Medical History:  Diagnosis Date  . Anemia   . B12 DEFICIENCY 03/31/2008  . COPD 11/27/2008  . ESOPHAGEAL STRICTURE 01/29/2009  . MASS, LUNG 10/13/2009  . NASAL FRACTURE 08/12/2009  . OSTEOARTHRITIS 03/31/2008   Hips  . OSTEOPOROSIS 12/27/2007  . PEDAL EDEMA 12/27/2007  . Pneumonia 07/10/2016  . PULMONARY NODULE 10/21/2009  . RESPIRATORY FAILURE, CHRONIC 06/29/2010  . Shortness of breath dyspnea     Tobacco History: Social History   Tobacco Use  Smoking Status Former Smoker  . Packs/day: 0.50  . Years: 35.00  . Pack years: 17.50  . Quit date: 12/18/1978  . Years since quitting: 42.1  Smokeless Tobacco Never Used   Counseling given: Not Answered   Outpatient Medications Prior to Visit  Medication Sig Dispense Refill  . albuterol (PROVENTIL) (2.5 MG/3ML) 0.083% nebulizer solution USE 1 VIAL VIA NEBULIZER EVERY 4 HOURS AS NEEDED  FOR WHEEZING OR SHORTNESS OF BREATH 150 mL 5  . ALPRAZolam (XANAX) 0.25 MG tablet TAKE 1 TABLET(0.25 MG) BY MOUTH TWICE DAILY AS NEEDED FOR ANXIETY 40 tablet 2  . Cholecalciferol 50000 units TABS Take 5,000 Units by mouth.    . cyanocobalamin (,VITAMIN B-12,) 1000 MCG/ML injection INJECT 1 ML IN THE MUSCLE MONTHLY 3 mL 3  . Dextromethorphan-guaiFENesin (MUCINEX FAST-MAX DM MAX) 5-100 MG/5ML LIQD Take  20 mLs by mouth 4 (four) times daily as needed (cough, congestion). 1775 mL 12  . escitalopram (LEXAPRO) 5 MG tablet TAKE 1 TABLET(5 MG) BY MOUTH DAILY 90 tablet 1  . furosemide (LASIX) 20 MG tablet Take 1 tablet (20 mg total) by mouth daily as needed. 30 tablet 2  . HYDROcodone-acetaminophen (HYCET) 7.5-325 mg/15 ml solution Take 5-10 mLs by mouth 2 (two) times daily as needed for severe pain. 120 mL 0  . mirtazapine (REMERON) 15 MG tablet TAKE 1 TABLET(15 MG) BY MOUTH AT BEDTIME 90 tablet 1  . Multiple Vitamins-Minerals (ICAPS AREDS 2 PO) Take 1 tablet by mouth daily.    . mupirocin ointment (BACTROBAN) 2 % Apply 1 application topically 2 (two) times daily. 30 g 2  . OXYGEN Oxygen 3lpm 24/7    . pantoprazole (PROTONIX) 40 MG tablet Take 1 tablet (40 mg total) by mouth daily. Take 30-60 min before first meal of the day 90 tablet 3  . Polyethyl Glycol-Propyl Glycol (SYSTANE OP) Place 1 drop into both eyes daily.    . polyethylene glycol powder (GLYCOLAX/MIRALAX) 17 GM/SCOOP powder DISSOLVE 17 GRAMS(1 SCOOP) IN 6-8 OZ OF FLUID AND DRINK BY MOUTH ONCE DAILY AS NEEDED FOR CONSTIPATION 238 g 2  . predniSONE (DELTASONE) 5 MG tablet Take 1 tablet (5 mg total) by mouth daily with breakfast. 90 tablet 0  . Wheat Dextrin (BENEFIBER) POWD Take 1 Dose by mouth as needed.    . DULERA 200-5 MCG/ACT AERO INHALE 2 PUFFS BY MOUTH FIRST THING IN MORNING AND THEN ANOTHER 2 PUFFS ABOUT 12 HOURS LATER 13 g 2  . SPIRIVA RESPIMAT 2.5 MCG/ACT AERS INHALE 2 PUFFS INTO THE LUNGS DAILY 4 g 2  . naproxen sodium (ALEVE) 220 MG tablet Take 220 mg by mouth as needed. (Patient not taking: Reported on 02/15/2021)    . triamcinolone (NASACORT) 55 MCG/ACT AERO nasal inhaler Place 2 sprays into the nose daily. (Patient not taking: Reported on 02/15/2021) 1 Inhaler 12   No facility-administered medications prior to visit.     Review of Systems:   Constitutional:   No  weight loss, night sweats,  Fevers, chills,  +fatigue, or   lassitude.  HEENT:   No headaches,  Difficulty swallowing,  Tooth/dental problems, or  Sore throat,                No sneezing, itching, ear ache, nasal congestion, post nasal drip,   CV:  No chest pain,  Orthopnea, PND, +swelling in lower extremities, anasarca, dizziness, palpitations, syncope.   GI  No heartburn, indigestion, abdominal pain, nausea, vomiting, diarrhea, change in bowel habits, loss of appetite, bloody stools.   Resp:    No chest wall deformity  Skin: no rash or lesions.  GU: no dysuria, change in color of urine, no urgency or frequency.  No flank pain, no hematuria   MS:  No joint pain or swelling.  No decreased range of motion.  No back pain.    Physical Exam  BP 100/60 (BP Location: Right Arm, Patient Position: Sitting, Cuff  Size: Normal)   Pulse 86   Temp (!) 97.2 F (36.2 C) (Temporal)   Wt 107 lb 3.2 oz (48.6 kg)   SpO2 96%   BMI 17.84 kg/m   GEN: A/Ox3; pleasant , NAD, elderly in wheelchair on oxygen   HEENT:  [place]/AT,    NOSE-clear, THROAT-clear, no lesions, no postnasal drip or exudate noted.   NECK:  Supple w/ fair ROM; no JVD; normal carotid impulses w/o bruits; no thyromegaly or nodules palpated; no lymphadenopathy.    RESP few scattered rhonchi no accessory muscle use, no dullness to percussion  CARD:  RRR, no m/r/g, tr  peripheral edema, pulses intact, no cyanosis or clubbing.  GI:   Soft & nt; nml bowel sounds; no organomegaly or masses detected.   Musco: Warm bil, no deformities or joint swelling noted.   Neuro: alert, no focal deficits noted.    Skin: Warm, no lesions or rashes very thin skin along hands and lower extremities    Lab Results:  CBC  BNP No results found for: BNP   Imaging: No results found.    No flowsheet data found.  No results found for: NITRICOXIDE      Assessment & Plan:   COPD exacerbation (HCC) Recent COPD exacerbation treated by hospice with antibiotics and steroids.  Patient seems to be  clinically making some improvement.  She is now on low-dose prednisone at 5 mg. Encouraged her on increasing mucociliary clearance/pulmonary hygiene with flutter valve. We will change Dulera to Symbicort due to manufacture issues  Plan  Patient Instructions  Change Dulera to Symbicort 160 2 puffs Twice daily  , rinse after use  Continue on Spiriva 2 puffs daily  Flutter valve Three times a day  .  Activity as tolerated  Continue on Oxygen 4 l/m , maintain O2 sats 88-90%.  Follow up with Dr. Sherene Sires  In 4 months and As needed        Chronic respiratory failure with hypoxia and hypercapnia (HCC) Continue on oxygen 4 L at rest and 6 L with activity to maintain O2 saturations greater than 88 to 90%.  Protein calorie malnutrition (HCC) High-protein diet     Loetta Connelley, NP 02/15/2021

## 2021-02-15 NOTE — Assessment & Plan Note (Signed)
Recent COPD exacerbation treated by hospice with antibiotics and steroids.  Patient seems to be clinically making some improvement.  She is now on low-dose prednisone at 5 mg. Encouraged her on increasing mucociliary clearance/pulmonary hygiene with flutter valve. We will change Dulera to Symbicort due to manufacture issues  Plan  Patient Instructions  Change Dulera to Symbicort 160 2 puffs Twice daily  , rinse after use  Continue on Spiriva 2 puffs daily  Flutter valve Three times a day  .  Activity as tolerated  Continue on Oxygen 4 l/m , maintain O2 sats 88-90%.  Follow up with Dr. Sherene Sires  In 4 months and As needed

## 2021-02-17 ENCOUNTER — Other Ambulatory Visit: Payer: Self-pay

## 2021-02-18 ENCOUNTER — Ambulatory Visit: Payer: Federal, State, Local not specified - PPO | Admitting: Family Medicine

## 2021-02-18 ENCOUNTER — Encounter: Payer: Self-pay | Admitting: Family Medicine

## 2021-02-18 VITALS — HR 67 | Temp 97.9°F | Ht 65.0 in | Wt 108.4 lb

## 2021-02-18 DIAGNOSIS — J449 Chronic obstructive pulmonary disease, unspecified: Secondary | ICD-10-CM

## 2021-02-18 DIAGNOSIS — E871 Hypo-osmolality and hyponatremia: Secondary | ICD-10-CM

## 2021-02-18 DIAGNOSIS — J9612 Chronic respiratory failure with hypercapnia: Secondary | ICD-10-CM

## 2021-02-18 DIAGNOSIS — J9611 Chronic respiratory failure with hypoxia: Secondary | ICD-10-CM

## 2021-02-18 DIAGNOSIS — M810 Age-related osteoporosis without current pathological fracture: Secondary | ICD-10-CM

## 2021-02-18 DIAGNOSIS — E538 Deficiency of other specified B group vitamins: Secondary | ICD-10-CM | POA: Diagnosis not present

## 2021-02-18 DIAGNOSIS — Z13220 Encounter for screening for lipoid disorders: Secondary | ICD-10-CM

## 2021-02-18 NOTE — Progress Notes (Signed)
Sharon [name] DOB: August 19, 1923 Encounter date: 02/18/2021  This is a 87 y.o. female who presents with Chief Complaint  Patient presents with  . Follow-up    History of present illness: Recently started on low dose daily prednisone to see if this would give some relief with chronic resp failure/hypoxia/COPD. Does follow with pulm. Recently treated for copd exacerbation  and put on steroid burst.   Coughing more; starts as soon as she moves. Sleeping through night usually; but wakes earlier in morning, then has some coughing; then goes back to bed. Usually wakes between 3:30-6 in morning, coughs, then goes back to sleep. Doesn't need breathing tx at that time. Does one at night and in the morning. Usually the early morning waking is to urinate - they increase oxygen to get her to bathroom and then turn back down when she gets in bed.   Has some spots on arm that she wants looked at.     Allergies  Allergen Reactions  . Penicillins Rash    Has patient had a PCN reaction causing immediate rash, facial/tongue/throat swelling, SOB or lightheadedness with hypotension: Yes Has patient had a PCN reaction causing severe rash involving mucus membranes or skin necrosis: Unknown Has patient had a PCN reaction that required hospitalization: Unknown Has patient had a PCN reaction occurring within the last 10 years: No If all of the above answers are "NO", then may proceed with Cephalosporin use.    Current Meds  Medication Sig  . sodium chloride (OCEAN) 0.65 % SOLN nasal spray Place 1 spray into both nostrils as needed for congestion.    Review of Systems  Constitutional: Positive for fatigue. Negative for activity change, appetite change, chills and fever.  Respiratory: Positive for cough and shortness of breath. Negative for chest tightness and wheezing.   Cardiovascular: Negative for chest pain, palpitations and leg swelling (foot swelling).  Gastrointestinal: Negative for abdominal pain,  constipation and diarrhea.  Neurological: Negative for headaches.  Psychiatric/Behavioral: Negative for sleep disturbance. The patient is nervous/anxious.        She is tired of not being able to do things for herself and tired of not being independent. It is so much work to do anything for her - go to bathroom for example. She is tired of wearing oxygen. She is tired of chronic breathing issues.    Objective:  Pulse 67   Temp 97.9 F (36.6 C) (Oral)   Ht 5\' 5"  (1.651 m)   Wt 108 lb 6.4 oz (49.2 kg)   SpO2 94%   BMI 18.04 kg/m   Weight: 108 lb 6.4 oz (49.2 kg)   BP Readings from Last 3 Encounters:  02/15/21 100/60  11/17/20 112/66  10/13/20 (!) 160/84   Wt Readings from Last 3 Encounters:  02/18/21 108 lb 6.4 oz (49.2 kg)  02/15/21 107 lb 3.2 oz (48.6 kg)  11/17/20 109 lb 3.2 oz (49.5 kg)    Physical Exam Constitutional:      General: She is not in acute distress.    Appearance: She is underweight.     Comments: She is frail   Cardiovascular:     Rate and Rhythm: Normal rate and regular rhythm.     Heart sounds: Normal heart sounds. No murmur heard. No friction rub.  Pulmonary:     Effort: Pulmonary effort is normal. No respiratory distress.     Breath sounds: Decreased breath sounds present. No wheezing, rhonchi or rales.  Musculoskeletal:     Right  lower leg: No edema.     Left lower leg: No edema.  Feet:     Comments: bilat trace-1+ edema feet. No open sores. 2+ pedal pulses bilat. Neurological:     Mental Status: She is alert and oriented to person, place, and time.  Psychiatric:        Behavior: Behavior normal.     Assessment/Plan  1. Chronic respiratory failure with hypoxia and hypercapnia (HCC) She does regularly follow with pulmonology.  After her last flare of COPD/pneumonia, hospice writer reached out to ask about daily prednisone.  This was started and see how she does with this.  I did encourage her to try the flutter valve device again, as I feel  that this would help her clear her airways.  Her biggest issues with coughing.  During the day she has small bouts, but feels overall that these are more manageable.  Continue Symbicort, Spiriva, Mucinex, albuterol. - CMP; Future - CBC with Differential/Platelets; Future - CBC with Differential/Platelets - CMP  2. COPD GOLD III  See above. - CBC with Differential/Platelets; Future - CBC with Differential/Platelets  3. Osteoporosis, unspecified osteoporosis type, unspecified pathological fracture presence We will recheck blood work.  She did complete treatment course for osteoporosis in the past.  She is unable to do much quality of weightbearing exercise and his chronic prednisone will have some increased risk of osteoporosis, but we are hoping to keep her breathing stable. - Vitamin D, 25-hydroxy; Future - CMP; Future - CMP - Vitamin D, 25-hydroxy  4. Vitamin B12 deficiency Continue with oral supplementation.  Recheck levels. - Vitamin D, 25-hydroxy; Future - Vitamin B12; Future - Vitamin B12 - Vitamin D, 25-hydroxy  5. Lipid screening - Lipid Panel; Future - Lipid Panel  6. Hyponatremia - CMP; Future - CMP   Return in about 3 months (around 05/21/2021) for Chronic condition visit.    Theodis Shove, MD

## 2021-02-19 LAB — CBC WITH DIFFERENTIAL/PLATELET
Absolute Monocytes: 479 cells/uL (ref 200–950)
Basophils Absolute: 38 cells/uL (ref 0–200)
Basophils Relative: 0.4 %
Eosinophils Absolute: 47 cells/uL (ref 15–500)
Eosinophils Relative: 0.5 %
HCT: 32.9 % — ABNORMAL LOW (ref 35.0–45.0)
Hemoglobin: 10.9 g/dL — ABNORMAL LOW (ref 11.7–15.5)
Lymphs Abs: 855 cells/uL (ref 850–3900)
MCH: 31.3 pg (ref 27.0–33.0)
MCHC: 33.1 g/dL (ref 32.0–36.0)
MCV: 94.5 fL (ref 80.0–100.0)
MPV: 9.6 fL (ref 7.5–12.5)
Monocytes Relative: 5.1 %
Neutro Abs: 7981 cells/uL — ABNORMAL HIGH (ref 1500–7800)
Neutrophils Relative %: 84.9 %
Platelets: 359 10*3/uL (ref 140–400)
RBC: 3.48 10*6/uL — ABNORMAL LOW (ref 3.80–5.10)
RDW: 11.7 % (ref 11.0–15.0)
Total Lymphocyte: 9.1 %
WBC: 9.4 10*3/uL (ref 3.8–10.8)

## 2021-02-19 LAB — COMPREHENSIVE METABOLIC PANEL
AG Ratio: 1.6 (calc) (ref 1.0–2.5)
ALT: 15 U/L (ref 6–29)
AST: 15 U/L (ref 10–35)
Albumin: 4 g/dL (ref 3.6–5.1)
Alkaline phosphatase (APISO): 57 U/L (ref 37–153)
BUN/Creatinine Ratio: 28 (calc) — ABNORMAL HIGH (ref 6–22)
BUN: 15 mg/dL (ref 7–25)
CO2: 29 mmol/L (ref 20–32)
Calcium: 10.3 mg/dL (ref 8.6–10.4)
Chloride: 94 mmol/L — ABNORMAL LOW (ref 98–110)
Creat: 0.54 mg/dL — ABNORMAL LOW (ref 0.60–0.88)
Globulin: 2.5 g/dL (calc) (ref 1.9–3.7)
Glucose, Bld: 107 mg/dL — ABNORMAL HIGH (ref 65–99)
Potassium: 4.7 mmol/L (ref 3.5–5.3)
Sodium: 132 mmol/L — ABNORMAL LOW (ref 135–146)
Total Bilirubin: 0.3 mg/dL (ref 0.2–1.2)
Total Protein: 6.5 g/dL (ref 6.1–8.1)

## 2021-02-19 LAB — LIPID PANEL
Cholesterol: 177 mg/dL (ref <200)
HDL: 80 mg/dL (ref >=50)
LDL Cholesterol (Calc): 83 mg/dL (calc)
Non-HDL Cholesterol (Calc): 97 mg/dL (calc) (ref <130)
Total CHOL/HDL Ratio: 2.2 (calc) (ref <5.0)
Triglycerides: 61 mg/dL (ref <150)

## 2021-02-19 LAB — VITAMIN D 25 HYDROXY (VIT D DEFICIENCY, FRACTURES): Vit D, 25-Hydroxy: 105 ng/mL — ABNORMAL HIGH (ref 30–100)

## 2021-02-19 LAB — VITAMIN B12: Vitamin B-12: 276 pg/mL (ref 200–1100)

## 2021-03-17 ENCOUNTER — Telehealth: Payer: Self-pay | Admitting: Family Medicine

## 2021-03-17 DIAGNOSIS — R300 Dysuria: Secondary | ICD-10-CM

## 2021-03-17 NOTE — Telephone Encounter (Signed)
Left a detailed message on Sharon [name]'s voicemail stating Sharon [name] approved orders as PCP is out of the office and to have the family member call to schedule a lab appointment to have the pts tests performed.

## 2021-03-17 NOTE — Telephone Encounter (Signed)
[name] is calling in stating that the pt is having frequency and urgency and that is the only thing that is going on with the pt.  [name] would like to have a call back if there is any new orders that she needs to have.  [name] left a sterile urine cup with the family and wanted to know if it is okay for them to drop it off by the office and she is aware that we need a order for it she verbalized understanding.

## 2021-03-17 NOTE — Telephone Encounter (Signed)
Ok for UA and urine Culture

## 2021-03-18 ENCOUNTER — Telehealth: Payer: Self-pay | Admitting: Family Medicine

## 2021-03-18 ENCOUNTER — Other Ambulatory Visit (INDEPENDENT_AMBULATORY_CARE_PROVIDER_SITE_OTHER): Payer: Federal, State, Local not specified - PPO

## 2021-03-18 ENCOUNTER — Other Ambulatory Visit: Payer: Self-pay

## 2021-03-18 ENCOUNTER — Other Ambulatory Visit: Payer: Self-pay | Admitting: Family Medicine

## 2021-03-18 DIAGNOSIS — R300 Dysuria: Secondary | ICD-10-CM | POA: Diagnosis not present

## 2021-03-18 LAB — URINALYSIS, ROUTINE W REFLEX MICROSCOPIC
Bilirubin Urine: NEGATIVE
Hgb urine dipstick: NEGATIVE
Nitrite: POSITIVE — AB
Specific Gravity, Urine: 1.015 (ref 1.000–1.030)
Total Protein, Urine: NEGATIVE
Urine Glucose: NEGATIVE
Urobilinogen, UA: 0.2 (ref 0.0–1.0)
pH: 6 (ref 5.0–8.0)

## 2021-03-18 MED ORDER — CEPHALEXIN 250 MG/5ML PO SUSR: 500.0000 mg | Freq: Two times a day (BID) | ORAL | 0 refills | Status: DC

## 2021-03-18 NOTE — Telephone Encounter (Signed)
[name] is calling and stated that the family wanted to see if provider would go ahead and start patient on an antibiotic while they until the urinalysis and culture results come back, please advise. CB is [phone]

## 2021-03-18 NOTE — Telephone Encounter (Signed)
Attempted to call home; no answer. I did send in keflex for her and left message re this. Call if any worsening of symptoms.

## 2021-03-18 NOTE — Telephone Encounter (Signed)
Sharon [name] w/[facility] is calling back to see if she can get clarification on msg left from yesterday and would like to have a call back.

## 2021-03-18 NOTE — Telephone Encounter (Signed)
Patient's urine has been sent to [facility] lab for UA and culture

## 2021-03-18 NOTE — Telephone Encounter (Signed)
Sharon [name] from [facility] called to let Dr. Hassan Rowan know that she came by the office and dropped of the UA today.  The family wants to know if a Rx can be called in to the pharmacy before the weekend.  [id number] [name], [place] - [address] AT [facility] [address] [facility] [facility] [address] Phone:  [phone]  Fax:  [phone]

## 2021-03-20 LAB — URINE CULTURE
MICRO NUMBER:: 11721186
SPECIMEN QUALITY:: ADEQUATE

## 2021-03-21 ENCOUNTER — Encounter: Payer: Self-pay | Admitting: Family Medicine

## 2021-03-21 NOTE — Telephone Encounter (Signed)
[name] from [facility] called to see if the results came back for the UA and wants to know if there is any new orders based on the results.  [name] would like for the results to be faxed to her at [phone].  [facility] [phone]

## 2021-03-21 NOTE — Telephone Encounter (Signed)
Called on 4/1 and abx sent in for patient

## 2021-03-22 ENCOUNTER — Other Ambulatory Visit: Payer: Self-pay | Admitting: Family Medicine

## 2021-03-22 DIAGNOSIS — F419 Anxiety disorder, unspecified: Secondary | ICD-10-CM

## 2021-03-22 NOTE — Telephone Encounter (Signed)
Results from 4/1 faxed to [place] at the number below.

## 2021-04-04 ENCOUNTER — Other Ambulatory Visit: Payer: Self-pay | Admitting: Family Medicine

## 2021-04-05 ENCOUNTER — Telehealth: Payer: Self-pay | Admitting: Family Medicine

## 2021-04-05 MED ORDER — CEPHALEXIN 250 MG/5ML PO SUSR: ORAL | 0 refills | Status: DC

## 2021-04-05 NOTE — Telephone Encounter (Signed)
Sharon [name] called back and was informed of the message below.  Sharon [name] requested the liquid form be sent instead of the pill to [facility].  Message sent to Dr Caryl Never.

## 2021-04-05 NOTE — Telephone Encounter (Signed)
Left a message for [name] to return my call.

## 2021-04-05 NOTE — Telephone Encounter (Signed)
Rx done. 

## 2021-04-05 NOTE — Telephone Encounter (Signed)
Urine culture grew out E. coli.  Susceptibility testing is still pending.  Would go ahead and start Keflex 500 mg 3 times daily for 7 days

## 2021-04-05 NOTE — Telephone Encounter (Signed)
Littie Deeds a nurse from [facility] is calling and wanted to see if provider received a faxed of a UA culture for patient, please advise. CB [phone]

## 2021-04-07 NOTE — Telephone Encounter (Signed)
Littie Deeds from [facility] called to see if we received the patients urine culture results and wanted to know if she still needs to keep taking Cephalexin or does she need to have a different Rx called in. If another Rx gets called in she needs a liquid because she has trouble swallowing pills.  Littie Deeds [facility] [phone]

## 2021-04-08 NOTE — Telephone Encounter (Signed)
Spoke with [name] and informed her of the message below.  °

## 2021-04-25 ENCOUNTER — Other Ambulatory Visit: Payer: Self-pay | Admitting: Family Medicine

## 2021-05-14 ENCOUNTER — Other Ambulatory Visit: Payer: Self-pay | Admitting: Internal Medicine

## 2021-05-14 ENCOUNTER — Other Ambulatory Visit: Payer: Self-pay | Admitting: Family Medicine

## 2021-05-14 MED ORDER — ALBUTEROL SULFATE (2.5 MG/3ML) 0.083% IN NEBU: INHALATION_SOLUTION | RESPIRATORY_TRACT | 5 refills | Status: DC

## 2021-05-14 NOTE — Progress Notes (Signed)
[place] on call  Patient on hospice and running out of albuterol- I refilled this for her. Looks like rx was from Dr. Sherene Sires but I am sure he will not mind in this situation

## 2021-05-18 ENCOUNTER — Ambulatory Visit: Payer: Federal, State, Local not specified - PPO | Admitting: Family Medicine

## 2021-05-18 ENCOUNTER — Other Ambulatory Visit: Payer: Self-pay

## 2021-05-18 ENCOUNTER — Encounter: Payer: Self-pay | Admitting: Family Medicine

## 2021-05-18 VITALS — BP 100/60 | HR 69 | Temp 97.1°F | Ht 65.0 in

## 2021-05-18 DIAGNOSIS — J449 Chronic obstructive pulmonary disease, unspecified: Secondary | ICD-10-CM

## 2021-05-18 DIAGNOSIS — H9193 Unspecified hearing loss, bilateral: Secondary | ICD-10-CM | POA: Diagnosis not present

## 2021-05-18 DIAGNOSIS — R600 Localized edema: Secondary | ICD-10-CM

## 2021-05-18 DIAGNOSIS — J9611 Chronic respiratory failure with hypoxia: Secondary | ICD-10-CM | POA: Diagnosis not present

## 2021-05-18 DIAGNOSIS — F320 Major depressive disorder, single episode, mild: Secondary | ICD-10-CM

## 2021-05-18 NOTE — Progress Notes (Signed)
Sharon [name] DOB: 1923/04/25 Encounter date: 05/18/2021  This is a 87 y.o. female who presents with Chief Complaint  Patient presents with  . Follow-up    History of present illness: Coughing fits wear her out. Just drains all energy. Usually first thing in morning or with getting up to go to bathroom. Also at night.   Cough during day is more occasional. Also takes her 20+ minutes to complete a nebulizer treatment and this wears her out even further.   DIL states that she feels mood is down a little. Amrie just states that she is frustrated with her loss of function.   Notes some shakiness with being up and active.   Eating ok; but states that appetite is not great.   Not taking lasix. Left foot has been a little more swollen lately; better in morning but not completely gone.   She is currently on keflex for UTI and bronchitis. She is feeling better than she was prior to starting the course with both frequency of urination and cough.    Allergies  Allergen Reactions  . Penicillins Rash    Has patient had a PCN reaction causing immediate rash, facial/tongue/throat swelling, SOB or lightheadedness with hypotension: Yes Has patient had a PCN reaction causing severe rash involving mucus membranes or skin necrosis: Unknown Has patient had a PCN reaction that required hospitalization: Unknown Has patient had a PCN reaction occurring within the last 10 years: No If all of the above answers are "NO", then may proceed with Cephalosporin use.    Current Meds  Medication Sig  . albuterol (PROVENTIL) (2.5 MG/3ML) 0.083% nebulizer solution USE 1 VIAL VIA NEBULIZER EVERY 4 HOURS AS NEEDED FOR WHEEZING OR SHORTNESS OF BREATH  . albuterol (PROVENTIL) (2.5 MG/3ML) 0.083% nebulizer solution USE 1 VIAL VIA NEBULIZER EVERY 4 HOURS AS NEEDED FOR WHEEZING OR SHORTNESS OF BREATH  . ALPRAZolam (XANAX) 0.25 MG tablet TAKE 1 TABLET BY MOUTH TWICE DAILY AS NEEDED FOR ANXIETY  . benzonatate  (TESSALON) 100 MG capsule Take by mouth 3 (three) times daily as needed for cough.  . budesonide-formoterol (SYMBICORT) 160-4.5 MCG/ACT inhaler Inhale 2 puffs into the lungs in the morning and at bedtime.  . cephALEXin (KEFLEX) 250 MG/5ML suspension Take 2 teaspoons by mouth three times a day for 7 days  . Cholecalciferol 50000 units TABS Take 5,000 Units by mouth.  . cyanocobalamin (,VITAMIN B-12,) 1000 MCG/ML injection INJECT 1 ML IN THE MUSCLE MONTHLY  . escitalopram (LEXAPRO) 5 MG tablet TAKE 1 TABLET(5 MG) BY MOUTH DAILY  . furosemide (LASIX) 20 MG tablet Take 1 tablet (20 mg total) by mouth daily as needed.  . mirtazapine (REMERON) 15 MG tablet TAKE 1 TABLET(15 MG) BY MOUTH AT BEDTIME  . MUCINEX FAST-MAX DM MAX 20-400 MG/20ML LIQD TAKE 20 MLS BY MOUTH FOUR TIMES DAILY AS NEEDED FOR COUGH OR CONGESTION  . mupirocin ointment (BACTROBAN) 2 % Apply 1 application topically 2 (two) times daily.  . naproxen sodium (ALEVE) 220 MG tablet Take 220 mg by mouth as needed.  . OXYGEN Oxygen 3lpm 24/7  . pantoprazole (PROTONIX) 40 MG tablet Take 1 tablet (40 mg total) by mouth daily. Take 30-60 min before first meal of the day  . Polyethyl Glycol-Propyl Glycol (SYSTANE OP) Place 1 drop into both eyes daily.  . polyethylene glycol powder (GLYCOLAX/MIRALAX) 17 GM/SCOOP powder DISSOLVE 17 GRAMS(1 SCOOP) IN 6-8 OZ OF FLUID AND DRINK BY MOUTH ONCE DAILY AS NEEDED FOR CONSTIPATION  . predniSONE (DELTASONE)  5 MG tablet Take 1 tablet (5 mg total) by mouth daily with breakfast.  . sodium chloride (OCEAN) 0.65 % SOLN nasal spray Place 1 spray into both nostrils as needed for congestion.  Marland Kitchen SPIRIVA RESPIMAT 2.5 MCG/ACT AERS INHALE 2 PUFFS INTO THE LUNGS DAILY  . Tiotropium Bromide Monohydrate (SPIRIVA RESPIMAT) 2.5 MCG/ACT AERS Inhale 2 puffs into the lungs daily.  . Wheat Dextrin (BENEFIBER) POWD Take 1 Dose by mouth as needed.    Review of Systems  Constitutional: Positive for fatigue. Negative for chills and  fever.  HENT: Positive for hearing loss (notes harder to participate in conversations lately).   Respiratory: Positive for cough and shortness of breath. Negative for chest tightness and wheezing.   Cardiovascular: Positive for leg swelling (L>R). Negative for chest pain and palpitations.  Gastrointestinal: Negative for abdominal pain.  Genitourinary: Negative for difficulty urinating and dysuria.  Musculoskeletal: Negative for arthralgias.  Neurological: Positive for weakness. Negative for dizziness, light-headedness and headaches. Tremors: not tremors but gets shaky with activity.    Objective:  BP 100/60   Pulse 69   Temp (!) 97.1 F (36.2 C) (Axillary)   Ht 5\' 5"  (1.651 m)   SpO2 99%   BMI 18.04 kg/m       BP Readings from Last 3 Encounters:  05/18/21 100/60  02/15/21 100/60  11/17/20 112/66   Wt Readings from Last 3 Encounters:  02/18/21 108 lb 6.4 oz (49.2 kg)  02/15/21 107 lb 3.2 oz (48.6 kg)  11/17/20 109 lb 3.2 oz (49.5 kg)    Physical Exam Constitutional:      General: She is not in acute distress.    Appearance: She is well-developed.  Cardiovascular:     Rate and Rhythm: Normal rate and regular rhythm.     Heart sounds: Normal heart sounds. No murmur heard. No friction rub.  Pulmonary:     Effort: Pulmonary effort is normal. No respiratory distress.     Breath sounds: Decreased breath sounds and rhonchi present. No wheezing or rales.  Musculoskeletal:     Right lower leg: Edema present.     Left lower leg: 1+ Edema (foot, ankle) present.  Skin:    Comments: Dependent erythema bilat feet  Neurological:     Mental Status: She is alert and oriented to person, place, and time.  Psychiatric:        Behavior: Behavior normal.     Assessment/Plan  1. Bilateral hearing loss, unspecified hearing loss type Her difficulty with hearing is somewhat isolating for her socially which I feel could further contribute to her depression.  She is willing to get this  evaluated and would consider treatment, so we will place referral today. - Ambulatory referral to Audiology  2. Chronic respiratory failure with hypoxia [facility]) She does follow regularly with pulmonology.  Her breathing has been steadily declining for years.  She is currently having some fatigue with completing her nebulizer treatments, but has a follow-up next month with pulmonology and can discuss with them.  I did send a message asking if there are other options.  I noted on previous visit with pulmonology that inhaler with spacer was not as effective for her.  3. COPD GOLD III  Patient is on Symbicort, Mucinex, albuterol, Spiriva.  She requires continuous oxygen.  4. Current mild episode of major depressive disorder without prior episode (HCC) We discussed the difficult situation that she has had with her chronic and progressive lung disease.  It is very frustrating for  her and she misses being able to do things independently.  She did not want to make a medication change today, but we did discuss that we certainly could adjust medications if she felt it would be helpful for her in the future.  She is currently taking Lexapro 5 mg daily and Remeron 15 mg daily.  If she feels that mood is getting any worse, I have asked her to let me know.  In the meanwhile, we will work on getting her hearing evaluated as this will help her be able to be more socially active with family.  5. Pedal edema This is stable at baseline.  She does try to avoid taking fluid pills because urinary frequency is very physically demanding for her and her weakened state and requires assistance.  I encouraged her to elevate her legs whenever she is sitting down.  This seems to help manage swelling.  We discussed that if uncomfortable then she could use Lasix as needed.  Discussed that edema is secondary to venous insufficiency, dependent edema, inactivity and may be related to protein status somewhat.   Return in about 3 months  (around 08/18/2021) for Chronic condition visit.    Theodis Shove, MD

## 2021-05-21 ENCOUNTER — Encounter: Payer: Self-pay | Admitting: Family Medicine

## 2021-05-22 ENCOUNTER — Other Ambulatory Visit: Payer: Self-pay | Admitting: Family Medicine

## 2021-05-22 DIAGNOSIS — F419 Anxiety disorder, unspecified: Secondary | ICD-10-CM

## 2021-05-25 ENCOUNTER — Ambulatory Visit: Payer: Federal, State, Local not specified - PPO | Admitting: Family Medicine

## 2021-06-09 ENCOUNTER — Telehealth: Payer: Self-pay | Admitting: Family Medicine

## 2021-06-09 NOTE — Telephone Encounter (Signed)
Patient recently got off an antibiotic but is having burning when urinating.  Home health states she refuses to drink enough fluids.  Daughter is requesting a call back to find out what she should do.

## 2021-06-10 NOTE — Telephone Encounter (Signed)
Patient daughter notified of update  and verbalized understanding. Called [facility] agency but no answer. Will try again shortly.

## 2021-06-10 NOTE — Telephone Encounter (Signed)
Should pt go for IV fluids in ED? Please advise

## 2021-06-10 NOTE — Telephone Encounter (Signed)
Can home health run another urinalysis and culture on her to make sure that infection has cleared? Her intake has continued to diminish. Can they find something that she is a big fan of (? Chocolate milk or vitamin water flavor) that they can just get her to take in a little more - offering frequent sips for her? I know that everything is more tiring for her at this point, but if they can get her to drink some every hour it will go a long way for helping to keep bladder flushed out. If she is feeling worse overall; then may need further evaluation - let me know.

## 2021-06-13 ENCOUNTER — Telehealth: Payer: Self-pay | Admitting: Family Medicine

## 2021-06-13 NOTE — Telephone Encounter (Signed)
Sharon [name] is calling in stating that the pt thinks that she has another UTI due to the pain and burning during urination and they are wanting to see if they can get  a antibiotic and pyridium and the pt in the past urine sample it had E-coli.  Sharon [name] has a urine sample going over to the lab today.

## 2021-06-13 NOTE — Telephone Encounter (Signed)
Sharon [name] is calling back to get the status of the below msg and she is aware that another provider is helping since the pts provider is not in the office.

## 2021-06-14 MED ORDER — CEPHALEXIN 250 MG/5ML PO SUSR: ORAL | 0 refills | Status: DC

## 2021-06-14 NOTE — Telephone Encounter (Signed)
Sharon [name] is returning your call.Her call back number [phone]

## 2021-06-14 NOTE — Telephone Encounter (Signed)
Rx sent Left message on machine for [place].

## 2021-06-14 NOTE — Addendum Note (Signed)
Addended by: Kern Reap B on: 06/14/2021 12:05 PM   Modules accepted: Orders

## 2021-06-15 ENCOUNTER — Encounter: Payer: Self-pay | Admitting: Hematology and Oncology

## 2021-06-16 ENCOUNTER — Encounter: Payer: Self-pay | Admitting: Family Medicine

## 2021-06-17 ENCOUNTER — Encounter: Payer: Self-pay | Admitting: Hematology and Oncology

## 2021-06-17 ENCOUNTER — Ambulatory Visit: Payer: Federal, State, Local not specified - PPO | Admitting: Internal Medicine

## 2021-06-22 ENCOUNTER — Ambulatory Visit (INDEPENDENT_AMBULATORY_CARE_PROVIDER_SITE_OTHER): Payer: Federal, State, Local not specified - PPO | Admitting: Adult Health

## 2021-06-22 ENCOUNTER — Other Ambulatory Visit: Payer: Self-pay

## 2021-06-22 ENCOUNTER — Encounter: Payer: Self-pay | Admitting: Hematology and Oncology

## 2021-06-22 ENCOUNTER — Encounter: Payer: Self-pay | Admitting: Adult Health

## 2021-06-22 ENCOUNTER — Ambulatory Visit (INDEPENDENT_AMBULATORY_CARE_PROVIDER_SITE_OTHER): Payer: Federal, State, Local not specified - PPO

## 2021-06-22 ENCOUNTER — Other Ambulatory Visit: Payer: Self-pay | Admitting: Internal Medicine

## 2021-06-22 VITALS — HR 86 | Temp 98.6°F | Wt 115.6 lb

## 2021-06-22 DIAGNOSIS — R600 Localized edema: Secondary | ICD-10-CM | POA: Diagnosis not present

## 2021-06-22 DIAGNOSIS — J9612 Chronic respiratory failure with hypercapnia: Secondary | ICD-10-CM

## 2021-06-22 DIAGNOSIS — J441 Chronic obstructive pulmonary disease with (acute) exacerbation: Secondary | ICD-10-CM

## 2021-06-22 DIAGNOSIS — J9611 Chronic respiratory failure with hypoxia: Secondary | ICD-10-CM | POA: Diagnosis not present

## 2021-06-22 MED ORDER — PREDNISONE 20 MG PO TABS: 20.0000 mg | ORAL_TABLET | Freq: Every day | ORAL | 0 refills | Status: DC

## 2021-06-22 NOTE — Patient Instructions (Addendum)
Increase Prednisone 20mg  daily for 5 days , then 5mg  daily .  Take Furosemide 20mg  daily for 3 days then daily as needed.  Chest xray today  Symbicort 160 2 puffs Twice daily  , rinse after use  Continue on Spiriva 2 puffs daily  Restart flutter valve 2-3 times a day  .  Activity as tolerated  Continue on Oxygen 4 l/m , maintain O2 sats 88-90%.  Follow up with Dr.  In 6 months and As needed   Please contact office for sooner follow up if symptoms do not improve or worsen or seek emergency care

## 2021-06-22 NOTE — Progress Notes (Signed)
@Patient  ID: , female    DOB: [birth year], 87 y.o.   MRN: [id number]  Chief Complaint  Patient presents with   Follow-up    Referring provider: [id number], MD  HPI: 87 yo female former smoker quit 1980 followed for GOLD 3 COPD , Bronchiectasis , chronic respiratory failure on O2 3l/m  Medical history significant for A Fib and Pulmonary nodule .   TEST/EVENTS :  12/03/2009-FEV1 67 (40%) with 12% response to bronchodilator and DLCO 55%  06/22/2021 Follow  up : COPD , Bronchiectasis , O2 RF  Patient returns for a 4 month follow up . Says breathing is not doing as well , increased cough and congestion over last few weeks. Some increased wheezing . Ankles more swollen , worse in evening . No fever, or discolored mucus .  Remains on Symbicort and Spiriva . Prednisone 5mg  daily  Hospice visits weekly .  Appetite is fair. No nv/d.  No increased oxygen demands.   Allergies  Allergen Reactions   Penicillins Rash    Has patient had a PCN reaction causing immediate rash, facial/tongue/throat swelling, SOB or lightheadedness with hypotension: Yes Has patient had a PCN reaction causing severe rash involving mucus membranes or skin necrosis: Unknown Has patient had a PCN reaction that required hospitalization: Unknown Has patient had a PCN reaction occurring within the last 10 years: No If all of the above answers are "NO", then may proceed with Cephalosporin use.     Immunization History  Administered Date(s) Administered   Fluad Quad(high Dose 65+) 08/12/2019   Influenza Split 09/26/2011, 09/27/2012   Influenza Whole 09/30/2009   Influenza, High Dose Seasonal PF 09/24/2013, 01/10/2016, 09/21/2016, 09/25/2017, 09/18/2018   Influenza-Unspecified 10/13/2014, 09/13/2020   PFIZER(Purple Top)SARS-COV-2 Vaccination 12/29/2019, 01/19/2020, 09/13/2020   Pneumococcal Conjugate-13 02/11/2014   Pneumococcal Polysaccharide-23 05/05/2009   Td 05/05/2009    Past Medical  History:  Diagnosis Date   Anemia    B12 DEFICIENCY 03/31/2008   Bronchiectasis with acute exacerbation (HCC) 12/24/2018   COPD 11/27/2008   COPD exacerbation (HCC) 11/27/2008   Qualifier: Diagnosis of  By: 14/10/2008  MD, 14/10/2008    ESOPHAGEAL STRICTURE 01/29/2009   MASS, LUNG 10/13/2009   NASAL FRACTURE 08/12/2009   OSTEOARTHRITIS 03/31/2008   Hips   OSTEOPOROSIS 12/27/2007   PEDAL EDEMA 12/27/2007   Pneumonia 07/10/2016   PULMONARY NODULE 10/21/2009   RESPIRATORY FAILURE, CHRONIC 06/29/2010   Shortness of breath dyspnea     Tobacco History: Social History   Tobacco Use  Smoking Status Former   Packs/day: 0.50   Years: 35.00   Pack years: 17.50   Types: Cigarettes   Quit date: 12/18/1978   Years since quitting: 42.5  Smokeless Tobacco Never   Counseling given: Not Answered   Outpatient Medications Prior to Visit  Medication Sig Dispense Refill   albuterol (PROVENTIL) (2.5 MG/3ML) 0.083% nebulizer solution USE 1 VIAL VIA NEBULIZER EVERY 4 HOURS AS NEEDED FOR WHEEZING OR SHORTNESS OF BREATH 150 mL 5   albuterol (PROVENTIL) (2.5 MG/3ML) 0.083% nebulizer solution USE 1 VIAL VIA NEBULIZER EVERY 4 HOURS AS NEEDED FOR WHEEZING OR SHORTNESS OF BREATH 150 mL 5   ALPRAZolam (XANAX) 0.25 MG tablet TAKE 1 TABLET BY MOUTH TWICE DAILY AS NEEDED FOR ANXIETY 60 tablet 1   benzonatate (TESSALON) 100 MG capsule Take by mouth 3 (three) times daily as needed for cough.     budesonide-formoterol (SYMBICORT) 160-4.5 MCG/ACT inhaler Inhale 2 puffs into the lungs in the morning  and at bedtime. 1 each 12   Cholecalciferol 50000 units TABS Take 5,000 Units by mouth.     cyanocobalamin (,VITAMIN B-12,) 1000 MCG/ML injection INJECT 1 ML IN THE MUSCLE MONTHLY 3 mL 3   escitalopram (LEXAPRO) 5 MG tablet TAKE 1 TABLET(5 MG) BY MOUTH DAILY 90 tablet 1   furosemide (LASIX) 20 MG tablet Take 1 tablet (20 mg total) by mouth daily as needed. 30 tablet 2   mirtazapine (REMERON) 15 MG tablet TAKE 1 TABLET(15 MG) BY  MOUTH AT BEDTIME 90 tablet 1   MUCINEX FAST-MAX DM MAX 20-400 MG/20ML LIQD TAKE 20 MLS BY MOUTH FOUR TIMES DAILY AS NEEDED FOR COUGH OR CONGESTION 1775 mL 0   mupirocin ointment (BACTROBAN) 2 % Apply 1 application topically 2 (two) times daily. 30 g 2   naproxen sodium (ALEVE) 220 MG tablet Take 220 mg by mouth as needed.     OXYGEN Oxygen 3lpm 24/7     pantoprazole (PROTONIX) 40 MG tablet Take 1 tablet (40 mg total) by mouth daily. Take 30-60 min before first meal of the day 90 tablet 3   Polyethyl Glycol-Propyl Glycol (SYSTANE OP) Place 1 drop into both eyes daily.     polyethylene glycol powder (GLYCOLAX/MIRALAX) 17 GM/SCOOP powder DISSOLVE 17 GRAMS(1 SCOOP) IN 6-8 OZ OF FLUID AND DRINK BY MOUTH ONCE DAILY AS NEEDED FOR CONSTIPATION 238 g 2   predniSONE (DELTASONE) 5 MG tablet Take 1 tablet (5 mg total) by mouth daily with breakfast. 90 tablet 0   sodium chloride (OCEAN) 0.65 % SOLN nasal spray Place 1 spray into both nostrils as needed for congestion.     SPIRIVA RESPIMAT 2.5 MCG/ACT AERS INHALE 2 PUFFS INTO THE LUNGS DAILY 4 g 2   Tiotropium Bromide Monohydrate (SPIRIVA RESPIMAT) 2.5 MCG/ACT AERS Inhale 2 puffs into the lungs daily. 1 each 12   Wheat Dextrin (BENEFIBER) POWD Take 1 Dose by mouth as needed.     cephALEXin (KEFLEX) 250 MG/5ML suspension Take 2 teaspoons by mouth three times a day for 5 days (Patient not taking: Reported on 06/22/2021) 100 mL 0   No facility-administered medications prior to visit.     Review of Systems:   Constitutional:   No  weight loss, night sweats,  Fevers, chills, + fatigue, or  lassitude.  HEENT:   No headaches,  Difficulty swallowing,  Tooth/dental problems, or  Sore throat,                No sneezing, itching, ear ache, nasal congestion, post nasal drip,   CV:  No chest pain,  Orthopnea, PND, swelling in lower extremities, anasarca, dizziness, palpitations, syncope.   GI  No heartburn, indigestion, abdominal pain, nausea, vomiting, diarrhea,  change in bowel habits, loss of appetite, bloody stools.   Resp: .  No chest wall deformity  Skin: no rash or lesions.  GU: no dysuria, change in color of urine, no urgency or frequency.  No flank pain, no hematuria   MS:  No joint pain or swelling.  No decreased range of motion.  No back pain.    Physical Exam    GEN: A/Ox3; pleasant , NAD, well nourished , O2 , wheelchair    HEENT:  [place]/AT,  , NOSE-clear, THROAT-clear, no lesions, no postnasal drip or exudate noted.   NECK:  Supple w/ fair ROM; no JVD; normal carotid impulses w/o bruits; no thyromegaly or nodules palpated; no lymphadenopathy.    RESP  scattered rhonchi no wheezes/ rales/ or rhonchi.  no accessory muscle use, no dullness to percussion  CARD:  RRR, no m/r/g, 1+ peripheral edema, pulses intact, no cyanosis or clubbing.  GI:   Soft & nt; nml bowel sounds; no organomegaly or masses detected.   Musco: Warm bil, no deformities or joint swelling noted.   Neuro: alert, no focal deficits noted.    Skin: Warm, no lesions or rashes    Lab Results:    BMET  BNP No results found for: BNP  ProBNP   Imaging: No results found.    No flowsheet data found.  No results found for: NITRICOXIDE      Assessment & Plan:   No problem-specific Assessment & Plan notes found for this encounter.     Rubye Oaks, NP 06/22/2021

## 2021-06-23 ENCOUNTER — Telehealth: Payer: Self-pay | Admitting: Family Medicine

## 2021-06-23 MED ORDER — AZITHROMYCIN 250 MG PO TABS: ORAL_TABLET | ORAL | 0 refills | Status: AC

## 2021-06-23 NOTE — Telephone Encounter (Signed)
Spoke with the Hospice nurse, [name] at [phone] and informed her of the message below.  [name] stated the pt was given Cipro, has completed the course and complains of recurrent dysuria weekly.  Stated Dr Alphonsus Sias, Hospice provider recommended long term use of antibiotics and she wanted to ask Dr Hassan Rowan about this? Message sent to PCP.

## 2021-06-24 ENCOUNTER — Telehealth: Payer: Self-pay | Admitting: Adult Health

## 2021-06-24 ENCOUNTER — Other Ambulatory Visit: Payer: Self-pay | Admitting: Family Medicine

## 2021-06-24 ENCOUNTER — Other Ambulatory Visit: Payer: Self-pay | Admitting: *Deleted

## 2021-06-24 DIAGNOSIS — J441 Chronic obstructive pulmonary disease with (acute) exacerbation: Secondary | ICD-10-CM

## 2021-06-24 DIAGNOSIS — F419 Anxiety disorder, unspecified: Secondary | ICD-10-CM

## 2021-06-24 NOTE — Telephone Encounter (Signed)
I think long term antibiotic use is reasonable given recurrence and toll it takes on her. I would suggest one follow up repeat culture to ensure there isn't a secondary bacteria present; so that proper maintenance antibiotic can be selected. If hospice MD has already suggested med, I would trust his recommendation.

## 2021-06-24 NOTE — Telephone Encounter (Signed)
Left a message for the pt to return my call.  

## 2021-06-24 NOTE — Telephone Encounter (Signed)
Error in previous message-I spoke with [name] and informed her of the message below.  [name] stated Dr Gibson Ramp has given the pt Bactrim twice a day.

## 2021-06-24 NOTE — Telephone Encounter (Signed)
Received a fax from [facility] stating that we needed to call [employer] to initiate a PA for patient's Spiriva 2.61mcg. Called [employer] at [phone] and spoke with Shawn. She stated that [facility] has been incorrectly attaching [employer] to the incorrect accounts and she could not find the patient by that name. Per Ines Bloomer, it looks like the patient has coverage with [facility].   Was able to find [facility] info in her chart. Started PA on [email or web address]. Key is [id number]. Per [email or web address], no PA is needed. Spoke with the pharmacist at [facility], they are aware.  Nothing further needed at time of call.

## 2021-06-24 NOTE — Progress Notes (Signed)
Called and spoke with patient's daughter Olegario Messier [facility]) provided results/recommendations per Rubye Oaks NP.  Scheduled for f/u on 07/22/21 at 2:30 pm, advised to arrive by 2pm for cxr and check in.  She verbalized understanding.

## 2021-06-25 NOTE — Assessment & Plan Note (Signed)
Mild volume overload - gentle diuresis  Check cxr   Plan  Patient Instructions  Increase Prednisone 20mg  daily for 5 days , then 5mg  daily .  Take Furosemide 20mg  daily for 3 days then daily as needed.  Chest xray today  Symbicort 160 2 puffs Twice daily  , rinse after use  Continue on Spiriva 2 puffs daily  Restart flutter valve 2-3 times a day  .  Activity as tolerated  Continue on Oxygen 4 l/m , maintain O2 sats 88-90%.  Follow up with Dr.  In 6 months and As needed   Please contact office for sooner follow up if symptoms do not improve or worsen or seek emergency care

## 2021-06-25 NOTE — Assessment & Plan Note (Signed)
Continue on O2 to keep sats >88-90% 

## 2021-06-25 NOTE — Assessment & Plan Note (Signed)
Exacerbation -treat with short steroid burst  Check cxr   Plan  Patient Instructions  Increase Prednisone 20mg  daily for 5 days , then 5mg  daily .  Take Furosemide 20mg  daily for 3 days then daily as needed.  Chest xray today  Symbicort 160 2 puffs Twice daily  , rinse after use  Continue on Spiriva 2 puffs daily  Restart flutter valve 2-3 times a day  .  Activity as tolerated  Continue on Oxygen 4 l/m , maintain O2 sats 88-90%.  Follow up with Dr.  In 6 months and As needed   Please contact office for sooner follow up if symptoms do not improve or worsen or seek emergency care

## 2021-07-13 ENCOUNTER — Other Ambulatory Visit: Payer: Self-pay | Admitting: Family Medicine

## 2021-07-13 ENCOUNTER — Ambulatory Visit: Payer: Federal, State, Local not specified - PPO | Attending: Family Medicine | Admitting: Audiologist

## 2021-07-13 ENCOUNTER — Other Ambulatory Visit: Payer: Self-pay

## 2021-07-13 DIAGNOSIS — H903 Sensorineural hearing loss, bilateral: Secondary | ICD-10-CM | POA: Diagnosis present

## 2021-07-13 DIAGNOSIS — E538 Deficiency of other specified B group vitamins: Secondary | ICD-10-CM

## 2021-07-13 NOTE — Procedures (Signed)
  Outpatient Audiology and [facility] [address] [place], [place]  [address] [phone]  AUDIOLOGICAL  EVALUATION  NAME: MARYLYNN RIGDON     DOB:   January 31, 1923      MRN: [id number]                                                                                     DATE: 07/13/2021     REFERENT: Wynn Banker, MD STATUS: Outpatient DIAGNOSIS: Sensorineural Hearing Loss Bilateral     History: Inetha, 87 y.o., was seen for an audiological evaluation. Mikel was accompanied to the appointment by her daughter in law.  Giada is receiving a hearing evaluation due to concerns for difficulty hearing, especially women's voices and the television. This difficulty began gradually. No pain or pressure reported in either ear. No tinnitus present in either ear. No other relevant case history reported.    Evaluation:  Otoscopy showed a clear view of the tympanic membranes, bilaterally Tympanometry results were consistent with normal middle ear function, bilaterally   Audiometric testing was completed using conventional audiometry with insert transducer. Speech Recognition Thresholds were consistent with pure tone averages. Word Recognition was good at an elevated level. Pure tone thresholds show a mild sloping to moderately severe sensorineural hearing loss in both ears. Test results are consistent with age related hearing loss.   Results:  The test results were reviewed with Natascha and her daughter in law. Day is a candidate for hearing aids due to her hearing loss with good word recognition at louder levels. The amplification will help her hearing on a daily basis. She lives with her family who can help her charge them and put them on. She was given a list of local providers, however she can also go to a big box store like [employer]. Since her primary needs are to hear in the home and the TV, then a [employer] hearing aid would perform well for her needs. A referral was also placed  for a amplified phone with captions.    Recommendations: Amplification is necessary for both ears. Hearing aids can be purchased from a variety of locations. See provided list for locations in the [place] area.  Referral placed for Caption Call phone. This is a free phone with loud volume and captions. They will call the family and install the phone and train them how to use it. Ari's daughter in law should expect a call from [employer] within three business days.    Ammie Ferrier  Audiologist, Au.D., CCC-A 07/13/2021  5:03 PM  Cc: Wynn Banker, MD

## 2021-07-22 ENCOUNTER — Ambulatory Visit: Payer: Federal, State, Local not specified - PPO | Admitting: Adult Health

## 2021-08-02 ENCOUNTER — Other Ambulatory Visit: Payer: Self-pay

## 2021-08-02 ENCOUNTER — Ambulatory Visit (INDEPENDENT_AMBULATORY_CARE_PROVIDER_SITE_OTHER): Payer: Federal, State, Local not specified - PPO

## 2021-08-02 ENCOUNTER — Ambulatory Visit (INDEPENDENT_AMBULATORY_CARE_PROVIDER_SITE_OTHER): Payer: Federal, State, Local not specified - PPO | Admitting: Adult Health

## 2021-08-02 ENCOUNTER — Encounter: Payer: Self-pay | Admitting: Adult Health

## 2021-08-02 VITALS — HR 66 | Temp 98.7°F | Ht 65.0 in | Wt 115.6 lb

## 2021-08-02 DIAGNOSIS — J9611 Chronic respiratory failure with hypoxia: Secondary | ICD-10-CM

## 2021-08-02 DIAGNOSIS — J479 Bronchiectasis, uncomplicated: Secondary | ICD-10-CM | POA: Diagnosis not present

## 2021-08-02 DIAGNOSIS — J441 Chronic obstructive pulmonary disease with (acute) exacerbation: Secondary | ICD-10-CM

## 2021-08-02 DIAGNOSIS — J449 Chronic obstructive pulmonary disease, unspecified: Secondary | ICD-10-CM

## 2021-08-02 DIAGNOSIS — R600 Localized edema: Secondary | ICD-10-CM

## 2021-08-02 NOTE — Assessment & Plan Note (Signed)
Flare +/- Volume overload /Pneumonia  Check cxr today  Labs with bnp and bmet   Plan  Patient Instructions  Symbicort 160 2 puffs Twice daily  , rinse after use  Continue on Spiriva 2 puffs daily  Restart flutter valve 2-3 times a day  .  Activity as tolerated  Labs today .  Continue on Oxygen 4 l/m , maintain O2 sats 88-90%.  Follow up with Dr. Sherene Sires  or Allice Garro In 4- 6 months and As needed   Please contact office for sooner follow up if symptoms do not improve or worsen or seek emergency care

## 2021-08-02 NOTE — Assessment & Plan Note (Signed)
Encouraged on Flutter valve use.

## 2021-08-02 NOTE — Progress Notes (Signed)
@Patient  ID: , female    DOB: 07-06-1923, 86 y.o.   MRN: [id number]  Chief Complaint  Patient presents with   Follow-up    Referring provider: [id number], MD  HPI: 87 year old female former smoker quit 1980 followed for gold 3 COPD, bronchiectasis, chronic respiratory failure on oxygen 3 L Medical history significant for A. fib and pulmonary nodule  TEST/EVENTS :  12/03/2009-FEV1 67 (40%) with 12% response to bronchodilator and DLCO 55%  08/02/2021 Follow up : COPD , O2 RF , Pneumonia  Patient presents for a 1 month follow-up.  Patient was seen last visit with a COPD exacerbation plus or minus pneumonia.  Chest x-ray showed increased interstitial prominence, small bilateral pleural effusions and persistent left lower lobe atelectasis/infiltrate.  Patient was given a Z-Pak for possible underlying pneumonia.  Prednisone was increased to 20 mg for 5 days.  And she was recommended to take her Lasix 20 mg daily for 3 days and then as needed. Since last office visit . Legs swelling is improved. Breathing is doing about the same. Cough is slightly decreased. Gets winded with minimal activity . Wear oxygen 4l/m .     Allergies  Allergen Reactions   Penicillins Rash    Has patient had a PCN reaction causing immediate rash, facial/tongue/throat swelling, SOB or lightheadedness with hypotension: Yes Has patient had a PCN reaction causing severe rash involving mucus membranes or skin necrosis: Unknown Has patient had a PCN reaction that required hospitalization: Unknown Has patient had a PCN reaction occurring within the last 10 years: No If all of the above answers are "NO", then may proceed with Cephalosporin use.     Immunization History  Administered Date(s) Administered   Fluad Quad(high Dose 65+) 08/12/2019   Influenza Split 09/26/2011, 09/27/2012   Influenza Whole 09/30/2009   Influenza, High Dose Seasonal PF 09/24/2013, 01/10/2016, 09/21/2016, 09/25/2017,  09/18/2018   Influenza-Unspecified 10/13/2014, 09/13/2020   PFIZER(Purple Top)SARS-COV-2 Vaccination 12/29/2019, 01/19/2020, 09/13/2020   Pneumococcal Conjugate-13 02/11/2014   Pneumococcal Polysaccharide-23 05/05/2009   Td 05/05/2009    Past Medical History:  Diagnosis Date   Anemia    B12 DEFICIENCY 03/31/2008   Bronchiectasis with acute exacerbation (HCC) 12/24/2018   COPD 11/27/2008   COPD exacerbation (HCC) 11/27/2008   Qualifier: Diagnosis of  By: 14/10/2008  MD, Amador Cunas    ESOPHAGEAL STRICTURE 01/29/2009   MASS, LUNG 10/13/2009   NASAL FRACTURE 08/12/2009   OSTEOARTHRITIS 03/31/2008   Hips   OSTEOPOROSIS 12/27/2007   PEDAL EDEMA 12/27/2007   Pneumonia 07/10/2016   PULMONARY NODULE 10/21/2009   RESPIRATORY FAILURE, CHRONIC 06/29/2010   Shortness of breath dyspnea     Tobacco History: Social History   Tobacco Use  Smoking Status Former   Packs/day: 0.50   Years: 35.00   Pack years: 17.50   Types: Cigarettes   Start date: 1946   Quit date: 12/18/1978   Years since quitting: 42.6  Smokeless Tobacco Never   Counseling given: Not Answered   Outpatient Medications Prior to Visit  Medication Sig Dispense Refill   albuterol (PROVENTIL) (2.5 MG/3ML) 0.083% nebulizer solution USE 1 VIAL VIA NEBULIZER EVERY 4 HOURS AS NEEDED FOR WHEEZING OR SHORTNESS OF BREATH 150 mL 5   ALPRAZolam (XANAX) 0.25 MG tablet TAKE 1 TABLET(0.25 MG) BY MOUTH TWICE DAILY AS NEEDED FOR ANXIETY 60 tablet 2   benzonatate (TESSALON) 100 MG capsule Take by mouth 3 (three) times daily as needed for cough.     budesonide-formoterol (SYMBICORT) 160-4.5  MCG/ACT inhaler Inhale 2 puffs into the lungs in the morning and at bedtime. 1 each 12   Cholecalciferol 50000 units TABS Take 5,000 Units by mouth.     cyanocobalamin (,VITAMIN B-12,) 1000 MCG/ML injection INJECT 1 ML IN THE MUSCLE MONTHLY 3 mL 3   escitalopram (LEXAPRO) 5 MG tablet TAKE 1 TABLET(5 MG) BY MOUTH DAILY 90 tablet 1   furosemide (LASIX) 20 MG tablet  Take 1 tablet (20 mg total) by mouth daily as needed. 30 tablet 2   mirtazapine (REMERON) 15 MG tablet TAKE 1 TABLET(15 MG) BY MOUTH AT BEDTIME 90 tablet 1   MUCINEX FAST-MAX DM MAX 20-400 MG/20ML LIQD TAKE 20 MLS BY MOUTH FOUR TIMES DAILY AS NEEDED FOR COUGH OR CONGESTION 1775 mL 0   mupirocin ointment (BACTROBAN) 2 % Apply 1 application topically 2 (two) times daily. 30 g 2   naproxen sodium (ALEVE) 220 MG tablet Take 220 mg by mouth as needed.     OXYGEN Oxygen 3lpm 24/7     pantoprazole (PROTONIX) 40 MG tablet Take 1 tablet (40 mg total) by mouth daily. Take 30-60 min before first meal of the day 90 tablet 3   Polyethyl Glycol-Propyl Glycol (SYSTANE OP) Place 1 drop into both eyes daily.     polyethylene glycol powder (GLYCOLAX/MIRALAX) 17 GM/SCOOP powder DISSOLVE 17 GRAMS(1 SCOOP) IN 6-8 OZ OF FLUID AND DRINK BY MOUTH ONCE DAILY AS NEEDED FOR CONSTIPATION 238 g 2   predniSONE (DELTASONE) 20 MG tablet Take 1 tablet (20 mg total) by mouth daily with breakfast. 5 tablet 0   sodium chloride (OCEAN) 0.65 % SOLN nasal spray Place 1 spray into both nostrils as needed for congestion.     SPIRIVA RESPIMAT 2.5 MCG/ACT AERS INHALE 2 PUFFS INTO THE LUNGS DAILY 4 g 2   Wheat Dextrin (BENEFIBER) POWD Take 1 Dose by mouth as needed.     predniSONE (DELTASONE) 5 MG tablet Take 1 tablet (5 mg total) by mouth daily with breakfast. 90 tablet 0   albuterol (PROVENTIL) (2.5 MG/3ML) 0.083% nebulizer solution USE 1 VIAL VIA NEBULIZER EVERY 4 HOURS AS NEEDED FOR WHEEZING OR SHORTNESS OF BREATH 150 mL 5   Tiotropium Bromide Monohydrate (SPIRIVA RESPIMAT) 2.5 MCG/ACT AERS Inhale 2 puffs into the lungs daily. 1 each 12   No facility-administered medications prior to visit.     Review of Systems:   Constitutional:   No  weight loss, night sweats,  Fevers, chills, + fatigue, or  lassitude.  HEENT:   No headaches,  Difficulty swallowing,  Tooth/dental problems, or  Sore throat,                No sneezing, itching,  ear ache, nasal congestion, post nasal drip,   CV:  No chest pain,  Orthopnea, PND, swelling in lower extremities, anasarca, dizziness, palpitations, syncope.   GI  No heartburn, indigestion, abdominal pain, nausea, vomiting, diarrhea, change in bowel habits, loss of appetite, bloody stools.   Resp: .  No chest wall deformity  Skin: no rash or lesions.  GU: no dysuria, change in color of urine, no urgency or frequency.  No flank pain, no hematuria   MS:  No joint pain or swelling.  No decreased range of motion.  No back pain.    Physical Exam  Pulse 66   Temp 98.7 F (37.1 C) (Oral)   Ht 5\' 5"  (1.651 m)   Wt 115 lb 9.6 oz (52.4 kg)   SpO2 99% Comment: 4L  BMI 19.24 kg/m   GEN: A/Ox3; pleasant , NAD, elderly , on O2    HEENT:  [place]/AT,  NOSE-clear, THROAT-clear, no lesions, no postnasal drip or exudate noted.   NECK:  Supple w/ fair ROM; no JVD; normal carotid impulses w/o bruits; no thyromegaly or nodules palpated; no lymphadenopathy.    RESP  Clear  P & A; w/o, wheezes/ rales/ or rhonchi. no accessory muscle use, no dullness to percussion  CARD:  RRR, no m/r/g, tr  peripheral edema, pulses intact, no cyanosis or clubbing.  GI:   Soft & nt; nml bowel sounds; no organomegaly or masses detected.   Musco: Warm bil, no deformities or joint swelling noted.   Neuro: alert, no focal deficits noted.    Skin: Warm, no lesions or rashes, thin skin, scattered ecchymosis     Lab Results:    BNP No results found for: BNP  ProBNP    No flowsheet data found.  No results found for: NITRICOXIDE      Assessment & Plan:   COPD GOLD III  Flare +/- Volume overload /Pneumonia  Check cxr today  Labs with bnp and bmet   Plan  Patient Instructions  Symbicort 160 2 puffs Twice daily  , rinse after use  Continue on Spiriva 2 puffs daily  Restart flutter valve 2-3 times a day  .  Activity as tolerated  Labs today .  Continue on Oxygen 4 l/m , maintain O2 sats 88-90%.   Follow up with Dr. Sherene Sires  or Adreonna Yontz In 4- 6 months and As needed   Please contact office for sooner follow up if symptoms do not improve or worsen or seek emergency care        Chronic respiratory failure with hypoxia (HCC) Cont on O2 .   Bronchiectasis without acute exacerbation (HCC) Encouraged on Flutter valve use.      Rubye Oaks, NP 08/02/2021

## 2021-08-02 NOTE — Patient Instructions (Addendum)
Symbicort 160 2 puffs Twice daily  , rinse after use  Continue on Spiriva 2 puffs daily  Restart flutter valve 2-3 times a day  .  Activity as tolerated  Labs today .  Continue on Oxygen 4 l/m , maintain O2 sats 88-90%.  Follow up with Dr. Sherene Sires  or Lonita Debes In 4- 6 months and As needed   Please contact office for sooner follow up if symptoms do not improve or worsen or seek emergency care

## 2021-08-02 NOTE — Assessment & Plan Note (Signed)
Cont on O2 .  

## 2021-08-03 LAB — BASIC METABOLIC PANEL
BUN: 17 mg/dL (ref 6–23)
CO2: 31 mEq/L (ref 19–32)
Calcium: 9.6 mg/dL (ref 8.4–10.5)
Chloride: 93 mEq/L — ABNORMAL LOW (ref 96–112)
Creatinine, Ser: 0.68 mg/dL (ref 0.40–1.20)
GFR: 73.59 mL/min (ref >60.00)
Glucose, Bld: 101 mg/dL — ABNORMAL HIGH (ref 70–99)
Potassium: 4.7 mEq/L (ref 3.5–5.1)
Sodium: 129 mEq/L — ABNORMAL LOW (ref 135–145)

## 2021-08-03 LAB — BRAIN NATRIURETIC PEPTIDE: Pro B Natriuretic peptide (BNP): 65 pg/mL (ref 0.0–100.0)

## 2021-08-08 NOTE — Progress Notes (Signed)
Called and spoke with patient, advised her of results/recommendations per Tammy Parrett NP.  She verbalized understanding.  Nothing further needed.

## 2021-08-11 ENCOUNTER — Telehealth: Payer: Self-pay | Admitting: Family Medicine

## 2021-08-11 DIAGNOSIS — F320 Major depressive disorder, single episode, mild: Secondary | ICD-10-CM

## 2021-08-11 MED ORDER — MUCINEX FAST-MAX DM MAX 20-400 MG/20ML PO LIQD: ORAL | 0 refills | Status: DC

## 2021-08-11 MED ORDER — MIRTAZAPINE 15 MG PO TABS: ORAL_TABLET | ORAL | 0 refills | Status: DC

## 2021-08-11 NOTE — Telephone Encounter (Signed)
Spoke with Dellia Cloud and she stated the patient requested refills on Mucinex and Mirtazapine.  Dellia Cloud is aware refills were sent to [facility] on both medications and agreed to contact the pt to let her know she needs a follow up appt soon.

## 2021-08-11 NOTE — Telephone Encounter (Signed)
Kesha from hospice called for Mardene Celeste to give her a phone call back. The phone call is about requesting a refill for the patient.   Dellia Cloud would like to be contacted at [phone].  Please advise.

## 2021-08-12 ENCOUNTER — Telehealth: Payer: Self-pay | Admitting: Family Medicine

## 2021-08-12 ENCOUNTER — Ambulatory Visit: Payer: Federal, State, Local not specified - PPO | Admitting: Internal Medicine

## 2021-08-12 MED ORDER — MUCINEX FAST-MAX DM MAX 20-400 MG/20ML PO LIQD: ORAL | 0 refills | Status: DC

## 2021-08-12 NOTE — Telephone Encounter (Signed)
Spoke with Sharon [name] and informed her the Rx was sent and received by [facility] on 8/25 at 1:44pm and in the future to have the family contact the pharmacy.  She stated she spoke with the pharmacy and they do not have the Rx.  Sharon [name] is aware the Rx was re-sent today.

## 2021-08-12 NOTE — Telephone Encounter (Signed)
Sharon [name] called on behalf patient wanting to make sure the Mucinex was sent over to [facility]. [facility] states they do not have refill authorization.      Good callback number is [phone]   Please Advise

## 2021-08-18 ENCOUNTER — Telehealth: Payer: Self-pay | Admitting: Family Medicine

## 2021-08-18 NOTE — Telephone Encounter (Signed)
Littie Deeds with hopice called to let Dr.Koberlein know that the patient is getting re-certified to hospice.  Please advise.

## 2021-08-19 ENCOUNTER — Ambulatory Visit: Payer: Federal, State, Local not specified - PPO | Admitting: Family Medicine

## 2021-08-19 ENCOUNTER — Other Ambulatory Visit: Payer: Self-pay

## 2021-08-19 ENCOUNTER — Other Ambulatory Visit: Payer: Self-pay | Admitting: Family Medicine

## 2021-08-19 ENCOUNTER — Encounter: Payer: Self-pay | Admitting: Family Medicine

## 2021-08-19 VITALS — HR 71 | Temp 98.2°F | Ht 65.0 in

## 2021-08-19 DIAGNOSIS — J449 Chronic obstructive pulmonary disease, unspecified: Secondary | ICD-10-CM

## 2021-08-19 DIAGNOSIS — J9611 Chronic respiratory failure with hypoxia: Secondary | ICD-10-CM | POA: Diagnosis not present

## 2021-08-19 DIAGNOSIS — D649 Anemia, unspecified: Secondary | ICD-10-CM

## 2021-08-19 DIAGNOSIS — E871 Hypo-osmolality and hyponatremia: Secondary | ICD-10-CM | POA: Diagnosis not present

## 2021-08-19 DIAGNOSIS — R600 Localized edema: Secondary | ICD-10-CM

## 2021-08-19 NOTE — Telephone Encounter (Signed)
Ok; please let me know if there is any paperwork I need to do/orders. I am seeing her today.

## 2021-08-19 NOTE — Patient Instructions (Signed)
Ok to use lasix daily through next week; if still needing it to help with swelling at that point let me know and we will repeat some bloodwork.

## 2021-08-19 NOTE — Telephone Encounter (Signed)
Left a detailed message on Sharon [name]'s  voicemail [phone]) with the information below.

## 2021-08-19 NOTE — Progress Notes (Signed)
Sharon [name] DOB: Dec 17, 1923 Encounter date: 08/19/2021  This is a 87 y.o. female who presents with Chief Complaint  Patient presents with   Follow-up    History of present illness: Saw pulmonology 8/16. Continues on 4L oxygen, symbicort 2 puffs BID, spiriva 2 puffs daily, encouraged restarting flutter valve. She doesn't like using this because she has coughing spells with it and doesn't really feel better even after some mucous clearance.  Last visit with me was 3 months ago.   Was doing well without swelling but started to come back so has had 4 days of lasix. Just left leg swelling more. Some improvement. Is ok if she has feet elevated at night in bed, but swells once she gets up. Not much change that they think triggered swelling.   Does a re-eval with hospice provider on regular basis via zoom call.    Allergies  Allergen Reactions   Penicillins Rash    Has patient had a PCN reaction causing immediate rash, facial/tongue/throat swelling, SOB or lightheadedness with hypotension: Yes Has patient had a PCN reaction causing severe rash involving mucus membranes or skin necrosis: Unknown Has patient had a PCN reaction that required hospitalization: Unknown Has patient had a PCN reaction occurring within the last 10 years: No If all of the above answers are "NO", then may proceed with Cephalosporin use.    Current Meds  Medication Sig   albuterol (PROVENTIL) (2.5 MG/3ML) 0.083% nebulizer solution USE 1 VIAL VIA NEBULIZER EVERY 4 HOURS AS NEEDED FOR WHEEZING OR SHORTNESS OF BREATH   ALPRAZolam (XANAX) 0.25 MG tablet TAKE 1 TABLET(0.25 MG) BY MOUTH TWICE DAILY AS NEEDED FOR ANXIETY   benzonatate (TESSALON) 100 MG capsule Take by mouth 3 (three) times daily as needed for cough.   budesonide-formoterol (SYMBICORT) 160-4.5 MCG/ACT inhaler Inhale 2 puffs into the lungs in the morning and at bedtime.   Cholecalciferol 50000 units TABS Take 5,000 Units by mouth.   cyanocobalamin  (,VITAMIN B-12,) 1000 MCG/ML injection INJECT 1 ML IN THE MUSCLE MONTHLY   Dextromethorphan-guaiFENesin (MUCINEX FAST-MAX DM MAX) 5-100 MG/5ML LIQD TAKE 20 MLS BY MOUTH FOUR TIMES DAILY AS NEEDED FOR COUGH OR CONGESTION   escitalopram (LEXAPRO) 5 MG tablet TAKE 1 TABLET(5 MG) BY MOUTH DAILY   furosemide (LASIX) 20 MG tablet Take 1 tablet (20 mg total) by mouth daily as needed.   mirtazapine (REMERON) 15 MG tablet TAKE 1 TABLET(15 MG) BY MOUTH AT BEDTIME   Multiple Vitamins-Minerals (PRESERVISION AREDS PO) Take by mouth daily.   mupirocin ointment (BACTROBAN) 2 % Apply 1 application topically 2 (two) times daily.   naproxen sodium (ALEVE) 220 MG tablet Take 220 mg by mouth as needed.   OVER THE COUNTER MEDICATION OTC Acid controller-once a day   OXYGEN Oxygen 3lpm 24/7   pantoprazole (PROTONIX) 40 MG tablet TAKE 1 TABLET(40 MG) BY MOUTH DAILY 30 TO 60 MINUTES BEFORE FIRST MEAL OF THE DAY   Polyethyl Glycol-Propyl Glycol (SYSTANE OP) Place 1 drop into both eyes daily.   polyethylene glycol powder (GLYCOLAX/MIRALAX) 17 GM/SCOOP powder DISSOLVE 17 GRAMS(1 SCOOP) IN 6-8 OZ OF FLUID AND DRINK BY MOUTH ONCE DAILY AS NEEDED FOR CONSTIPATION   PREDNISONE PO Take 5 mg by mouth daily.   sodium chloride (OCEAN) 0.65 % SOLN nasal spray Place 1 spray into both nostrils as needed for congestion.   SPIRIVA RESPIMAT 2.5 MCG/ACT AERS INHALE 2 PUFFS INTO THE LUNGS DAILY   Sulfamethoxazole-Trimethoprim (SULFATRIM PEDIATRIC PO) 20mg  daily   Wheat Dextrin (  BENEFIBER) POWD Take 1 Dose by mouth as needed.    Review of Systems  Constitutional:  Positive for fatigue (with coughing spells). Negative for chills and fever.  Respiratory:  Positive for cough and shortness of breath. Negative for chest tightness and wheezing.   Cardiovascular:  Negative for chest pain, palpitations and leg swelling.  Psychiatric/Behavioral:  Negative for sleep disturbance.    Objective:  Pulse 71   Temp 98.2 F (36.8 C) (Oral)   Ht  5\' 5"  (1.651 m)   SpO2 97%   BMI 19.24 kg/m       BP Readings from Last 3 Encounters:  05/18/21 100/60  02/15/21 100/60  11/17/20 112/66   Wt Readings from Last 3 Encounters:  08/02/21 115 lb 9.6 oz (52.4 kg)  06/22/21 115 lb 9.6 oz (52.4 kg)  02/18/21 108 lb 6.4 oz (49.2 kg)    Physical Exam Constitutional:      General: She is not in acute distress.    Appearance: She is well-developed.  Cardiovascular:     Rate and Rhythm: Normal rate and regular rhythm.     Pulses:          Dorsalis pedis pulses are 2+ on the right side and 2+ on the left side.     Heart sounds: Normal heart sounds. No murmur heard.   No friction rub.  Pulmonary:     Effort: Pulmonary effort is normal. No respiratory distress.     Breath sounds: Decreased breath sounds present. No wheezing or rales.  Musculoskeletal:     Right lower leg: 1+ Edema present.     Left lower leg: 1+ Edema present.  Neurological:     Mental Status: She is alert and oriented to person, place, and time.  Psychiatric:        Attention and Perception: Attention normal.        Mood and Affect: Mood normal.        Speech: Speech normal.        Behavior: Behavior normal.     Comments: Mood is not depressed, per patient, but she is not happy with her current status. It is frustrating for her to have this declined level of functioning and loss of independence.     Assessment/Plan   1. Chronic respiratory failure with hypoxia (HCC) She is stable with breathing overall, but her activity tolerance continues to gradually decline. She is 4L oxygen dependent, still with frequent coughing episodes. Limited with any activity due to cough.   2. Anemia, unspecified type We discussed getting bloodwork if she continues to require lasix for lower extremity edema.   3. Lower extremity edema Elevate when sitting or sleeping. She cannot tolerate compression (can't get them on and feels uncomfortable)  4. Hyponatremia We will recheck  labwork if she continues with lasix use into next week. Typically she has not required regular use of lasix and just uses rarely prn.    Return in about 3 months (around 11/18/2021) for Chronic condition visit.    14/01/2021, MD

## 2021-08-25 ENCOUNTER — Telehealth: Payer: Self-pay | Admitting: Family Medicine

## 2021-08-25 NOTE — Telephone Encounter (Signed)
Sharon [name] from [facility] called to advise that the PT has been taking furosemide (LASIX) 20 MG tablet for the past 5 days for swelling of her legs. There has been no improvements. She provided the # of [phone] for a callback for any questions.

## 2021-08-26 ENCOUNTER — Other Ambulatory Visit: Payer: Self-pay | Admitting: Family Medicine

## 2021-08-26 DIAGNOSIS — E871 Hypo-osmolality and hyponatremia: Secondary | ICD-10-CM

## 2021-08-26 DIAGNOSIS — E878 Other disorders of electrolyte and fluid balance, not elsewhere classified: Secondary | ICD-10-CM

## 2021-08-26 DIAGNOSIS — R636 Underweight: Secondary | ICD-10-CM

## 2021-08-26 DIAGNOSIS — R600 Localized edema: Secondary | ICD-10-CM

## 2021-08-26 NOTE — Telephone Encounter (Signed)
I spoke with [name] from [facility] and she stated that the labs can be drawn at the pt's home. [name] reported that the pt is having shortness of breath upon exertion and the pt is urinating regularly. [name] stated that the pt is wheelchair bound and is not elevating her legs. [name] has been informed that if the pt is able to tolerate legs wrapping then that is ok to do.

## 2021-08-26 NOTE — Telephone Encounter (Signed)
*  I am going to order some labwork - are they able to draw this at her house? If so then ok to fax order *make sure no other changes in symptoms (like being short of breath) or worsening in swelling since last visit  *if not urinating with the 20mg ; then could double up to 40mg  (2 tablets at same time) to see if this works. If she is urinating well with the 20mg ; then we could also consider second dose in afternoon to help get out more fluid; but I think this will push her tolerance of exercise with having to use restroom too frequently.  *make sure skin looking ok on legs *keep legs elevated when sitting  If everything is stable; then let's get labwork and I will advise once I have results. If patient is able to tolerate leg wrapping (I know she doesn't like compression stockings); this could be helpful as well.

## 2021-08-31 ENCOUNTER — Telehealth: Payer: Self-pay | Admitting: *Deleted

## 2021-08-31 NOTE — Telephone Encounter (Signed)
-----   Message from Wynn Banker, MD sent at 08/31/2021  7:42 AM EDT ----- Received recent bloodwork (on paper). All labs look stable to slightly improved. Please check in and see how she is feeling and how swelling is doing? Make sure breathing is at her baseline. See if she is still taking the lasix and if she is still noting increased urination after taking a dose (she should note significant increase in urination)

## 2021-08-31 NOTE — Telephone Encounter (Signed)
Spoke with the patients daughter in law and informed her of the results as below.  She stated the patient seems to be feeling OK, swelling is the same, seems to be "feeling down" with low energy and is weaker.  Stated the patient seems to struggle slightly more to breathe and this is worse since the last visit.  Also stated the patient is still taking Lasix and has noticed a slight increase in urination.  Message sent to PCP.

## 2021-08-31 NOTE — Telephone Encounter (Signed)
Spoke with DIL; sx really about the same. Discussed using lasix just prn; ok to use 40mg  if feeling not getting good release of urine with the 20mg  lasix. Breathing is at baseline. Cannot tolerate compression. Limited mobility/protein status contribute. Will call if any worsening of sx/change in status.

## 2021-09-06 ENCOUNTER — Encounter: Payer: Self-pay | Admitting: Family Medicine

## 2021-09-06 NOTE — Progress Notes (Signed)
Prealbumin-25-8/11/2021-results received via fax from [facility]

## 2021-09-15 ENCOUNTER — Other Ambulatory Visit: Payer: Self-pay | Admitting: Family Medicine

## 2021-09-15 ENCOUNTER — Telehealth: Payer: Self-pay | Admitting: Internal Medicine

## 2021-09-15 NOTE — Telephone Encounter (Signed)
Prednisone 20 mg now and daily until better then 10 mg daily x 5 days then resume 5 mg daily, to increase back to 20 if flares in future and taper as above again

## 2021-09-15 NOTE — Telephone Encounter (Signed)
Return call from [place] # [phone].Caren Griffins

## 2021-09-15 NOTE — Telephone Encounter (Signed)
Called and spoke with [name], RN [facility].  [name] stated Patient is having increased sob today, even at rest, and a wet sounding cough. [name] stated she could hear wheezing, without a stethoscope.  [name] stated she heard  right side crackles in her lungs.  [name] stated Patient is increasing her lasix and has 5mg  Prednisone she is taking daily.  is asking for a prednisone burst or taper to help.  Message routed to Dr. [name] to advise  LOV 08/02/21-Tammy, NP   Instructions  Symbicort 160 2 puffs Twice daily  , rinse after use  Continue on Spiriva 2 puffs daily  Restart flutter valve 2-3 times a day  .  Activity as tolerated  Labs today .  Continue on Oxygen 4 l/m , maintain O2 sats 88-90%.  Follow up with Dr. 08/04/21  or Parrett In 4- 6 months and As needed   Please contact office for sooner follow up if symptoms do not improve or worsen or seek emergency care

## 2021-09-15 NOTE — Telephone Encounter (Signed)
[facility] [place], [facility]

## 2021-09-15 NOTE — Telephone Encounter (Signed)
Spoke with Sharon [name] to give her the recs from Dr. Sherene Sires, she repeated it back to me and expressed understanding. Advised her to call if they need anything else. Nothing further needed at this time.

## 2021-09-19 ENCOUNTER — Telehealth: Payer: Self-pay | Admitting: Family Medicine

## 2021-09-19 ENCOUNTER — Other Ambulatory Visit: Payer: Self-pay | Admitting: Family Medicine

## 2021-09-19 DIAGNOSIS — F419 Anxiety disorder, unspecified: Secondary | ICD-10-CM

## 2021-09-19 NOTE — Telephone Encounter (Signed)
Patient's relative Nicholos Johns called stating that patient's prescription for furosemide (LASIX) 20 MG tablet is expired and they are needing a new prescription. She states that the pharmacy does not have the prescription on file. She also says that the patient has some of the medication left but it is expired so she is probably not receiving the full dose.  Patient uses [facility] [phone] - , [place] - [address].

## 2021-09-19 NOTE — Telephone Encounter (Signed)
Spoke with the pharmacist Hillery Jacks as the Rx was sent on 9/29.  She stated the Rx is ready for pick up and they will contact the patient.

## 2021-10-01 ENCOUNTER — Other Ambulatory Visit: Payer: Self-pay | Admitting: Family Medicine

## 2021-10-01 DIAGNOSIS — F419 Anxiety disorder, unspecified: Secondary | ICD-10-CM

## 2021-10-05 ENCOUNTER — Other Ambulatory Visit: Payer: Self-pay | Admitting: Family Medicine

## 2021-10-07 ENCOUNTER — Telehealth: Payer: Self-pay | Admitting: Family Medicine

## 2021-10-07 NOTE — Telephone Encounter (Signed)
Patient brought in paperwork that she needs Dr.Koberlein to complete. Paperwork will be placed in the folder.  Patient could be contacted at [phone] or [phone] when paperwork is complete.  Please advise.

## 2021-10-10 ENCOUNTER — Other Ambulatory Visit: Payer: Self-pay | Admitting: Family Medicine

## 2021-10-10 NOTE — Telephone Encounter (Signed)
Paper work completed.

## 2021-10-10 NOTE — Telephone Encounter (Signed)
Patient's daughter-in-law was informed the placard was left at the front desk for pick up.

## 2021-10-23 ENCOUNTER — Other Ambulatory Visit: Payer: Self-pay | Admitting: Family Medicine

## 2021-10-24 ENCOUNTER — Other Ambulatory Visit: Payer: Self-pay | Admitting: Family Medicine

## 2021-10-24 DIAGNOSIS — F419 Anxiety disorder, unspecified: Secondary | ICD-10-CM

## 2021-10-25 NOTE — Telephone Encounter (Signed)
Per Maralyn Sago at [facility], the Rx was filled today for #60.  Maralyn Sago stated the Rx was called in on 10/16/2021 and 10/02/2021 for #30 by [facility].  Message sent to PCP.

## 2021-10-25 NOTE — Telephone Encounter (Signed)
Please check with pharmacy. I keep sending 60 tablets with refills and they are only filling 30 tablets at a time which means that the family has to go there every 2 weeks to get the xanax. Is this being limited through insurance? Do we need to do mail order pharmacy instead to get larger supply? I will resend rx today. Just not sure why they aren't even filling a full month supply when I send it in.

## 2021-10-28 NOTE — Telephone Encounter (Signed)
Noted. Patient is on hospice and they are likely managing refills when low.

## 2021-10-31 ENCOUNTER — Other Ambulatory Visit: Payer: Self-pay | Admitting: Family Medicine

## 2021-11-11 ENCOUNTER — Other Ambulatory Visit: Payer: Self-pay | Admitting: Family Medicine

## 2021-11-11 DIAGNOSIS — F320 Major depressive disorder, single episode, mild: Secondary | ICD-10-CM

## 2021-11-12 ENCOUNTER — Other Ambulatory Visit: Payer: Self-pay | Admitting: Adult Health

## 2021-11-16 ENCOUNTER — Other Ambulatory Visit (HOSPITAL_COMMUNITY): Payer: Self-pay

## 2021-11-16 ENCOUNTER — Telehealth: Payer: Self-pay | Admitting: Pharmacy Technician

## 2021-11-16 ENCOUNTER — Encounter: Payer: Self-pay | Admitting: Hematology and Oncology

## 2021-11-16 NOTE — Telephone Encounter (Signed)
Patient Advocate Encounter  Received notification from [employer] that prior authorization for SYMBICORT 160 is required.   PA NOT NEEDED  Ricke Hey, CPhT Patient Advocate Phone: [phone] Fax:  [phone]

## 2021-11-17 ENCOUNTER — Other Ambulatory Visit: Payer: Self-pay

## 2021-11-17 ENCOUNTER — Telehealth: Payer: Self-pay | Admitting: Internal Medicine

## 2021-11-17 MED ORDER — PREDNISONE 10 MG PO TABS: ORAL_TABLET | ORAL | 0 refills | Status: AC

## 2021-11-17 MED ORDER — AZITHROMYCIN 250 MG PO TABS: ORAL_TABLET | ORAL | 0 refills | Status: AC

## 2021-11-17 MED ORDER — PREDNISONE 10 MG PO TABS: ORAL_TABLET | ORAL | 0 refills | Status: DC

## 2021-11-17 NOTE — Telephone Encounter (Signed)
Prefer she be eval in ER but if declines can try zpak/ Prednisone 10 mg take  4 each am x 2 days,   2 each am x 2 days,  1 each am x 2 days and stop

## 2021-11-17 NOTE — Telephone Encounter (Signed)
Spoke with [name] from [employer] who states pt's respiration rate has increased to 38, pt at rest is sating 95% on 4L but quickly drops to mid 80s when walking to bathroom. [name] states she hears more crackles in lower right lung. [name] believes pt may be having a COPD exacerbation.

## 2021-11-17 NOTE — Telephone Encounter (Signed)
Called and spoke to patients son Molly Maduro ok per DPR. States he doesn't feel she needs to go to the ER at this time but will have pt try prednisone and z pak. Nothing further needed.    zpak/ Prednisone 10 mg take  4 each am x 2 days,   2 each am x 2 days,  1 each am x 2 days and stop

## 2021-11-18 ENCOUNTER — Telehealth: Payer: Self-pay | Admitting: Internal Medicine

## 2021-11-18 NOTE — Telephone Encounter (Signed)
Call returned to patient daughter, confirmed patient DOB. Daughter states she wanted to know if patient should take prednisone while taking the Zpack, I made her aware she should take the prednisone taper then return to her normal prednisone dose after completion. Voiced understanding.   Nothing further needed at this time.

## 2021-11-19 ENCOUNTER — Telehealth: Payer: Self-pay | Admitting: Internal Medicine

## 2021-11-19 NOTE — Telephone Encounter (Signed)
  -  DIL Nicholos Johns says Z pak went through but the pred did not.    Did not get it from yesterday -> Prednisone 10 mg take  4 each am x 2 days,   2 each am x 2 days,  1 each am x 2 days and stop   [facility] [facility] [place] called 11/19/2021 with above at 9:38 AM       SIGNATURE    Dr. Kalman Shan, M.D., F.C.C.P,  Pulmonary and Critical Care Medicine Staff Physician, [facility] Director - Interstitial Lung Disease  Program  Pulmonary Fibrosis [facility] at [place], [place], [address]  NPI Number:  NPI #[id number]  Pager: [phone], If no answer  -> Check AMION or Try [phone] Telephone (clinical office): [phone] Telephone (research): [phone]  9:35 AM 11/19/2021

## 2021-11-25 ENCOUNTER — Ambulatory Visit: Payer: Federal, State, Local not specified - PPO | Admitting: Family Medicine

## 2021-11-25 ENCOUNTER — Encounter: Payer: Self-pay | Admitting: Hematology and Oncology

## 2021-11-25 ENCOUNTER — Encounter: Payer: Self-pay | Admitting: Family Medicine

## 2021-11-25 VITALS — HR 72 | Temp 98.2°F | Ht 65.0 in | Wt 116.3 lb

## 2021-11-25 DIAGNOSIS — F419 Anxiety disorder, unspecified: Secondary | ICD-10-CM | POA: Diagnosis not present

## 2021-11-25 DIAGNOSIS — Z23 Encounter for immunization: Secondary | ICD-10-CM

## 2021-11-25 MED ORDER — ESCITALOPRAM OXALATE 10 MG PO TABS: 10.0000 mg | ORAL_TABLET | Freq: Every day | ORAL | 1 refills | Status: DC

## 2021-11-25 NOTE — Progress Notes (Signed)
Sharon [name] DOB: 04-12-1923 Encounter date: 11/25/2021  This is a 87 y.o. female who presents with Chief Complaint  Patient presents with   Follow-up    History of present illness:  DIL Here with her today. Just saw hospice nurse yesterday. They have prescribed a low dose morphine when breathing is difficult. Just completed a couple of courses of abx. 12/2 just finished prednisone taper. Took azithromycin course as well. Within series of medication had bad case of diarrhea - now is doing ok with bowels. Thinks just abx and prednisone today. Hasn't had chance to try the morphine yet. They thought this would work better than taking xanax increased.   Just feels shaky all the time. Worries about her condition.   Feeling really tired.   Currently on 4L oxygen - breathing today is stable from baseline - short of breath, cough, difficulty with any activity (like bathroom). They increase oxygen to 8L with bathroom use. Does have coughing fit, short of breath with episode. DIL states that baseline increased work of breathing is there.   No longer doing showers. Does wash cloths bathing.    Allergies  Allergen Reactions   Penicillins Rash    Has patient had a PCN reaction causing immediate rash, facial/tongue/throat swelling, SOB or lightheadedness with hypotension: Yes Has patient had a PCN reaction causing severe rash involving mucus membranes or skin necrosis: Unknown Has patient had a PCN reaction that required hospitalization: Unknown Has patient had a PCN reaction occurring within the last 10 years: No If all of the above answers are "NO", then may proceed with Cephalosporin use.    No outpatient medications have been marked as taking for the 11/25/21 encounter (Office Visit) with Wynn Banker, MD.    Review of Systems  Constitutional:  Positive for fatigue. Negative for chills and fever.  Respiratory:  Positive for cough, shortness of breath and wheezing. Negative for  chest tightness.   Cardiovascular:  Negative for chest pain, palpitations and leg swelling.  Psychiatric/Behavioral:  Negative for sleep disturbance.    Objective:  Pulse 72   Temp 98.2 F (36.8 C) (Oral)   Ht 5\' 5"  (1.651 m)   Wt 116 lb 4.8 oz (52.8 kg)   SpO2 95%   BMI 19.35 kg/m   Weight: 116 lb 4.8 oz (52.8 kg)   BP Readings from Last 3 Encounters:  05/18/21 100/60  02/15/21 100/60  11/17/20 112/66   Wt Readings from Last 3 Encounters:  11/25/21 116 lb 4.8 oz (52.8 kg)  08/02/21 115 lb 9.6 oz (52.4 kg)  06/22/21 115 lb 9.6 oz (52.4 kg)    Physical Exam Constitutional:      General: She is not in acute distress.    Appearance: She is well-developed and underweight.  Cardiovascular:     Rate and Rhythm: Normal rate and regular rhythm.     Heart sounds: Normal heart sounds. No murmur heard.   No friction rub.  Pulmonary:     Effort: Pulmonary effort is normal. No respiratory distress.     Breath sounds: Decreased air movement present. Decreased breath sounds present. No wheezing or rales.  Musculoskeletal:     Right lower leg: No edema.     Left lower leg: No edema.  Neurological:     Mental Status: She is alert and oriented to person, place, and time.  Psychiatric:        Behavior: Behavior normal.    Assessment/Plan  1. Anxiety We are going to increase  lexapro to 10mg  daily. She is staring morphine as well through hospice for episodes of shortness of breath.  - escitalopram (LEXAPRO) 10 MG tablet; Take 1 tablet (10 mg total) by mouth daily.  Dispense: 90 tablet; Refill: 1  2. Need for pneumococcal vaccination - Pneumococcal conjugate vaccine 20-valent (Prevnar 20)   I had noted that patient's prior DO NOT RESUSCITATE status had changed.  When asked about this, they said that they had discussed with somebody that she would want chest compressions.  She saw her daughter-in-law have these, and feels that this is something that she would desire should her heart  stop.  We talked more about what chest compressions would entail, especially with her weakened and thin state.  We discussed recovery from chest compressions and potential rib fractures would be very difficult with her chronic breathing issues.  I encouraged her to discuss more questions with the hospice nurse, as I am not certain where confusion came from with CODE STATUS.  Patient's main concern is that she will feel short of breath and if she has a  DO NOT RESUSCITATE, then she will not be able to be helped.  We discussed that, especially with hospice care, DO NOT RESUSCITATE does not mean that they cannot provide supportive treatments to help with symptomatic relief when she is short of breath.  We discussed things like oxygen, nonrebreather's, and even current morphine that has been added to her med list. She does not wish to go to the hospital and does call hospice for first line when she has concerns. She does not want feeding tube and does not want to be on a vent.   Return in about 3 months (around 02/23/2022). 33 minutes spent in chart review, time with patient, charting.   04/25/2022, MD

## 2021-11-25 NOTE — Patient Instructions (Signed)
Talk over wishes with do not resuscitate with your hospice nurse. Today we discussed avoiding chest compressions, intubations, but it is important that your wishes are discussed.

## 2021-11-26 ENCOUNTER — Other Ambulatory Visit: Payer: Self-pay | Admitting: Adult Health

## 2021-11-28 ENCOUNTER — Telehealth: Payer: Self-pay | Admitting: Internal Medicine

## 2021-11-28 ENCOUNTER — Telehealth: Payer: Self-pay

## 2021-11-28 MED ORDER — SPIRIVA RESPIMAT 2.5 MCG/ACT IN AERS
2.0000 | INHALATION_SPRAY | Freq: Every day | RESPIRATORY_TRACT | 5 refills | Status: DC
Start: 2021-11-28 — End: 2022-03-27

## 2021-11-28 NOTE — Telephone Encounter (Signed)
Called again to follow up on previous call for the Lexapro and to state that patient also needed refills for Spiriva. I let her know that it looked like [facility] was the office she needed to call for that prescription and she stated she had the wrong number. She then asked if the other prescription was being worked on and I let her know that it had been routed back to the team and it could take 2-3 business days for prescriptions to be refilled     Good callback number is [phone]

## 2021-11-28 NOTE — Telephone Encounter (Addendum)
Caller called stating pharmacy has no record of Rx on file and caller would like a call back     Sharon [name] [phone] escitalopram (LEXAPRO) 10 MG tablet

## 2021-11-28 NOTE — Telephone Encounter (Signed)
Left a detailed message on the patient's daughter in law's voicemail stating to call back with what her exact questions for Escitalopram as a refill was sent to [facility] on 12/9.

## 2021-11-28 NOTE — Telephone Encounter (Signed)
Rx for pt's Spiriva has been sent to preferred pharmacy for pt. Called and spoke with pt's daughter Nicholos Johns letting her know this had been done. Nicholos Johns said she received notification from the pharmacy that a pre-authorization was needing to be done.  Routing to pharmacy team for them to review.

## 2021-11-29 ENCOUNTER — Other Ambulatory Visit (HOSPITAL_COMMUNITY): Payer: Self-pay

## 2021-11-29 NOTE — Telephone Encounter (Signed)
Ran [employer] test claim and returned a co-pay of $35. Called and verified with pharmacy if processed as the same. Called pt daughter Nicholos Johns and informed of such, and she was obliged. Please let us know if further assistance is needed.

## 2021-12-23 ENCOUNTER — Ambulatory Visit: Payer: Federal, State, Local not specified - PPO | Admitting: Internal Medicine

## 2021-12-26 ENCOUNTER — Other Ambulatory Visit: Payer: Self-pay | Admitting: Family Medicine

## 2022-02-02 ENCOUNTER — Telehealth: Payer: Self-pay | Admitting: Internal Medicine

## 2022-02-02 ENCOUNTER — Ambulatory Visit: Payer: Federal, State, Local not specified - PPO | Admitting: Adult Health

## 2022-02-02 MED ORDER — ALBUTEROL SULFATE (2.5 MG/3ML) 0.083% IN NEBU: INHALATION_SOLUTION | RESPIRATORY_TRACT | 5 refills | Status: AC

## 2022-02-02 NOTE — Telephone Encounter (Signed)
Sharon [name] daughter in law checking on refill for Albuterol solution. Will be out tomorrow. Sharon [name] phone number is [phone].

## 2022-02-02 NOTE — Telephone Encounter (Signed)
Albuterol solution has been sent to preferred pharmacy.  patient's daughter in law, Sharon [name]) is aware and voiced her understanding.  Nothing further needed.

## 2022-02-03 ENCOUNTER — Other Ambulatory Visit: Payer: Self-pay

## 2022-02-03 ENCOUNTER — Ambulatory Visit: Payer: Federal, State, Local not specified - PPO | Admitting: Adult Health

## 2022-02-03 ENCOUNTER — Encounter: Payer: Self-pay | Admitting: Adult Health

## 2022-02-03 DIAGNOSIS — J9611 Chronic respiratory failure with hypoxia: Secondary | ICD-10-CM | POA: Diagnosis not present

## 2022-02-03 DIAGNOSIS — J479 Bronchiectasis, uncomplicated: Secondary | ICD-10-CM | POA: Diagnosis not present

## 2022-02-03 DIAGNOSIS — E43 Unspecified severe protein-calorie malnutrition: Secondary | ICD-10-CM

## 2022-02-03 DIAGNOSIS — J449 Chronic obstructive pulmonary disease, unspecified: Secondary | ICD-10-CM

## 2022-02-03 DIAGNOSIS — J9612 Chronic respiratory failure with hypercapnia: Secondary | ICD-10-CM

## 2022-02-03 NOTE — Assessment & Plan Note (Signed)
High-protein diet.  Continue on Ensure daily

## 2022-02-03 NOTE — Patient Instructions (Addendum)
Symbicort 160  2 puffs Twice daily  , rinse after use  Continue on Spiriva 2 puffs daily  Prednisone 5mg  daily  Flutter valve 2-3 times a day  as needed  Tessalon Three times a day  As needed   Activity as tolerated  Saline nasal spray Twice daily   Saline nasal gel At bedtime  .  Continue on Oxygen 4 l/m rest , 8l/m with walking/activity to keep sats >88-90%.  Follow up with Dr.  or Shakara Tweedy In 4- 6 months and As needed   Please contact office for sooner follow up if symptoms do not improve or worsen or seek emergency care

## 2022-02-03 NOTE — Assessment & Plan Note (Signed)
Severe COPD with high symptom burden.  Patient is being followed by hospice at home.  Is using morphine for increased work of breathing. Continue on triple therapy maintenance inhalers.  Continue on nebulizers.  Chronic steroids with prednisone 5 mg.  Plan  Patient Instructions  Symbicort 160  2 puffs Twice daily  , rinse after use  Continue on Spiriva 2 puffs daily  Prednisone 5mg  daily  Flutter valve 2-3 times a day  as needed  Tessalon Three times a day  As needed   Activity as tolerated  Saline nasal spray Twice daily   Saline nasal gel At bedtime  .  Continue on Oxygen 4 l/m rest , 8l/m with walking/activity to keep sats >88-90%.  Follow up with Dr.  or Sharon [name] In 4- 6 months and As needed   Please contact office for sooner follow up if symptoms do not improve or worsen or seek emergency care

## 2022-02-03 NOTE — Assessment & Plan Note (Signed)
Continue on oxygen to maintain O2 saturations greater than 88 to 90%.  Currently oxygen needs are at 4 L at rest and 8 L with activity.

## 2022-02-03 NOTE — Progress Notes (Signed)
@Patient  ID: Sharon [name], female    DOB: 01-18-1923, 88 y.o.   MRN: [id number]  Chief Complaint  Patient presents with   Follow-up    Referring provider: Caren Macadam, MD  HPI: 88 year old female former smoker quit 1980 followed for Gold 3 COPD, bronchiectasis, chronic respiratory failure on oxygen at 4 L Medical history significant for atrial fibrillation.   TEST/EVENTS :  12/03/2009-FEV1 67 (40%) with 12% response to bronchodilator and DLCO 55%    02/03/2022 Follow up : COPD , O2 RF  Patient returns for a 76-month follow-up.  Patient has underlying severe COPD.  She is oxygen dependent on oxygen at 4 L.  Since last visit patient says overall breathing is doing the same .  She gets short of breath with minimum activities.  She can only walk a very short distance and has to increase her oxygen up to 8 L to keep her O2 saturations in the upper 80s. She is followed by hospice at home.  Is using morphine once daily .  The morphine seems to take some of the anxiety down. Remains on Symbicort and Spiriva. Albuterol neb Three times a day  . Prednisone 5mg  daily . Patient's appetite has decreased.  She is taking an Ensure once daily.  Weight remains stable She denies any hemoptysis, chest pain, orthopnea.  Has chronic lower ankle edema but no increased recently. She uses Best boy for cough occasionally.  Does have nasal stuffiness.  Well  Allergies  Allergen Reactions   Penicillins Rash    Has patient had a PCN reaction causing immediate rash, facial/tongue/throat swelling, SOB or lightheadedness with hypotension: Yes Has patient had a PCN reaction causing severe rash involving mucus membranes or skin necrosis: Unknown Has patient had a PCN reaction that required hospitalization: Unknown Has patient had a PCN reaction occurring within the last 10 years: No If all of the above answers are "NO", then may proceed with Cephalosporin use.     Immunization History   Administered Date(s) Administered   Fluad Quad(high Dose 65+) 08/12/2019, 10/13/2021   Influenza Split 09/26/2011, 09/27/2012   Influenza Whole 09/30/2009   Influenza, High Dose Seasonal PF 09/24/2013, 01/10/2016, 09/21/2016, 09/25/2017, 09/18/2018   Influenza-Unspecified 10/13/2014, 09/13/2020   PFIZER(Purple Top)SARS-COV-2 Vaccination 12/29/2019, 01/19/2020, 09/13/2020, 04/08/2021   PNEUMOCOCCAL CONJUGATE-20 11/25/2021   Pfizer Covid-19 Vaccine Bivalent Booster 11yrs & up 09/05/2021   Pneumococcal Conjugate-13 02/11/2014   Pneumococcal Polysaccharide-23 05/05/2009   Td 05/05/2009    Past Medical History:  Diagnosis Date   Anemia    B12 DEFICIENCY 03/31/2008   Bronchiectasis with acute exacerbation ([place]) 12/24/2018   COPD 11/27/2008   COPD exacerbation ([place]) 11/27/2008   Qualifier: Diagnosis of  By: Burnice Logan  MD, Doretha Sou    ESOPHAGEAL STRICTURE 01/29/2009   MASS, LUNG 10/13/2009   NASAL FRACTURE 08/12/2009   OSTEOARTHRITIS 03/31/2008   Hips   OSTEOPOROSIS 12/27/2007   PEDAL EDEMA 12/27/2007   Pneumonia 07/10/2016   PULMONARY NODULE 10/21/2009   RESPIRATORY FAILURE, CHRONIC 06/29/2010   Shortness of breath dyspnea     Tobacco History: Social History   Tobacco Use  Smoking Status Former   Packs/day: 0.50   Years: 35.00   Pack years: 17.50   Types: Cigarettes   Start date: 1946   Quit date: 12/18/1978   Years since quitting: 43.1  Smokeless Tobacco Never   Counseling given: Not Answered   Outpatient Medications Prior to Visit  Medication Sig Dispense Refill   albuterol (PROVENTIL) (2.5 MG/3ML)  0.083% nebulizer solution USE 1 VIAL VIA NEBULIZER EVERY 4 HOURS AS NEEDED FOR WHEEZING OR SHORTNESS OF BREATH 225 mL 5   ALPRAZolam (XANAX) 0.25 MG tablet TAKE 1 TABLET BY MOUTH TWICE DAILY AS NEEDED 60 tablet 5   benzonatate (TESSALON) 100 MG capsule Take by mouth 3 (three) times daily as needed for cough.     budesonide-formoterol (SYMBICORT) 160-4.5 MCG/ACT inhaler INHALE 2  PUFFS INTO THE LUNGS IN THE MORNING AND AT BEDTIME 10.2 g 11   Cholecalciferol 50000 units TABS Take 5,000 Units by mouth.     cyanocobalamin (,VITAMIN B-12,) 1000 MCG/ML injection INJECT 1 ML IN THE MUSCLE MONTHLY 3 mL 3   Dextromethorphan-guaiFENesin (MUCINEX FAST-MAX DM MAX) 5-100 MG/5ML LIQD TAKE 20 ML BY MOUTH FOUR TIMES DAILY AS NEEDED FOR COUGH AND CONGESTION 1775 mL 0   escitalopram (LEXAPRO) 10 MG tablet Take 1 tablet (10 mg total) by mouth daily. 90 tablet 1   furosemide (LASIX) 20 MG tablet TAKE 1 TABLET(20 MG) BY MOUTH DAILY AS NEEDED 30 tablet 2   mirtazapine (REMERON) 15 MG tablet TAKE 1 TABLET(15 MG) BY MOUTH AT BEDTIME 90 tablet 1   Morphine Sulfate (MORPHINE CONCENTRATE) 10 mg / 0.5 ml concentrated solution Take by mouth. 2.5 ml daily     Multiple Vitamins-Minerals (PRESERVISION AREDS PO) Take by mouth daily.     OVER THE COUNTER MEDICATION OTC Acid controller-once a day  [facility]     OXYGEN 4 L/min. Oxygen 3lpm 24/7     pantoprazole (PROTONIX) 40 MG tablet TAKE 1 TABLET(40 MG) BY MOUTH DAILY 30 TO 60 MINUTES BEFORE FIRST MEAL OF THE DAY 90 tablet 1   Polyethyl Glycol-Propyl Glycol (SYSTANE OP) Place 1 drop into both eyes daily.     polyethylene glycol powder (GLYCOLAX/MIRALAX) 17 GM/SCOOP powder DISSOLVE 17 GRAMS(1 SCOOP) IN 6-8 OZ OF FLUID AND DRINK BY MOUTH ONCE DAILY AS NEEDED FOR CONSTIPATION 238 g 2   predniSONE (DELTASONE) 5 MG tablet TAKE 1 TABLET BY MOUTH DAILY 90 tablet 1   sodium chloride (OCEAN) 0.65 % SOLN nasal spray Place 1 spray into both nostrils as needed for congestion.     Sulfamethoxazole-Trimethoprim (SULFATRIM PEDIATRIC PO) 20mg  daily     Tiotropium Bromide Monohydrate (SPIRIVA RESPIMAT) 2.5 MCG/ACT AERS Inhale 2 puffs into the lungs daily. 4 g 5   Wheat Dextrin (BENEFIBER) POWD Take 1 Dose by mouth as needed.     naproxen sodium (ALEVE) 220 MG tablet Take 220 mg by mouth as needed. (Patient not taking: Reported on 02/03/2022)     mupirocin ointment  (BACTROBAN) 2 % Apply 1 application topically 2 (two) times daily. (Patient not taking: Reported on 02/03/2022) 30 g 2   No facility-administered medications prior to visit.     Review of Systems:   Constitutional:   No  weight loss, night sweats,  Fevers, chills,  +fatigue, or  lassitude.  HEENT:   No headaches,  Difficulty swallowing,  Tooth/dental problems, or  Sore throat,                No sneezing, itching, ear ache, nasal congestion, post nasal drip,   CV:  No chest pain,  Orthopnea, PND, swelling in lower extremities, anasarca, dizziness, palpitations, syncope.   GI  No heartburn, indigestion, abdominal pain, nausea, vomiting, diarrhea, change in bowel habits, loss of appetite, bloody stools.   Resp:  No chest wall deformity  Skin: no rash or lesions.  GU: no dysuria, change in color of urine, no  urgency or frequency.  No flank pain, no hematuria   MS:  No joint pain or swelling.  No decreased range of motion.  No back pain.    Physical Exam  Pulse 98    Temp 98.5 F (36.9 C) (Oral)    Ht 5\' 2"  (1.575 m)    Wt 118 lb 12.8 oz (53.9 kg)    SpO2 91%    BMI 21.73 kg/m   GEN: A/Ox3; pleasant , NAD, elderly , wc , O2    HEENT:  [place]/AT,   NOSE-clear, THROAT-clear, no lesions, no postnasal drip or exudate noted.   NECK:  Supple w/ fair ROM; no JVD; normal carotid impulses w/o bruits; no thyromegaly or nodules palpated; no lymphadenopathy.    RESP  Clear  P & A; w/o, wheezes/ rales/ or rhonchi. no accessory muscle use, no dullness to percussion  CARD:  RRR, no m/r/g, tr-1  peripheral edema, pulses intact, no cyanosis or clubbing.  GI:   Soft & nt; nml bowel sounds; no organomegaly or masses detected.   Musco: Warm bil, no deformities or joint swelling noted.   Neuro: alert, no focal deficits noted.    Skin: Warm, no lesions or rashes    Lab Results:  CBC    Component Value Date/Time   WBC 9.4 02/18/2021 1536   RBC 3.48 (L) 02/18/2021 1536   HGB 10.9 (L)  02/18/2021 1536   HGB 11.0 (L) 10/13/2020 1516   HCT 32.9 (L) 02/18/2021 1536   PLT 359 02/18/2021 1536   PLT 329 10/13/2020 1516   MCV 94.5 02/18/2021 1536   MCH 31.3 02/18/2021 1536   MCHC 33.1 02/18/2021 1536   RDW 11.7 02/18/2021 1536   LYMPHSABS 855 02/18/2021 1536   MONOABS 1.0 10/13/2020 1516   EOSABS 47 02/18/2021 1536   BASOSABS 38 02/18/2021 1536    BMET    Component Value Date/Time   NA 129 (L) 08/02/2021 1550   K 4.7 08/02/2021 1550   CL 93 (L) 08/02/2021 1550   CO2 31 08/02/2021 1550   GLUCOSE 101 (H) 08/02/2021 1550   BUN 17 08/02/2021 1550   CREATININE 0.68 08/02/2021 1550   CREATININE 0.54 (L) 02/18/2021 1536   CALCIUM 9.6 08/02/2021 1550   GFRNONAA >60 10/13/2020 1516   GFRAA >60 09/01/2020 1538    BNP No results found for: BNP  ProBNP    Component Value Date/Time   PROBNP 65.0 08/02/2021 1550    Imaging: No results found.    No flowsheet data found.  No results found for: NITRICOXIDE      Assessment & Plan:   COPD GOLD III  Severe COPD with high symptom burden.  Patient is being followed by hospice at home.  Is using morphine for increased work of breathing. Continue on triple therapy maintenance inhalers.  Continue on nebulizers.  Chronic steroids with prednisone 5 mg.  Plan  Patient Instructions  Symbicort 160  2 puffs Twice daily  , rinse after use  Continue on Spiriva 2 puffs daily  Prednisone 5mg  daily  Flutter valve 2-3 times a day  as needed  Tessalon Three times a day  As needed   Activity as tolerated  Saline nasal spray Twice daily   Saline nasal gel At bedtime  .  Continue on Oxygen 4 l/m rest , 8l/m with walking/activity to keep sats >88-90%.  Follow up with Dr. Melvyn Novas  or Annisa Mazzarella In 4- 6 months and As needed   Please contact office for sooner  follow up if symptoms do not improve or worsen or seek emergency care        Protein calorie malnutrition (HCC) High-protein diet.  Continue on Ensure daily  Chronic  respiratory failure with hypoxia and hypercapnia (HCC) Continue on oxygen to maintain O2 saturations greater than 88 to 90%.  Currently oxygen needs are at 4 L at rest and 8 L with activity.  Bronchiectasis without acute exacerbation ([place]) Appears stable continue on flutter valve  Plan  Patient Instructions  Symbicort 160  2 puffs Twice daily  , rinse after use  Continue on Spiriva 2 puffs daily  Prednisone 5mg  daily  Flutter valve 2-3 times a day  as needed  Tessalon Three times a day  As needed   Activity as tolerated  Saline nasal spray Twice daily   Saline nasal gel At bedtime  .  Continue on Oxygen 4 l/m rest , 8l/m with walking/activity to keep sats >88-90%.  Follow up with Dr. Melvyn Novas  or Leticia Coletta In 4- 6 months and As needed   Please contact office for sooner follow up if symptoms do not improve or worsen or seek emergency care          Rexene Edison, NP 02/03/2022

## 2022-02-03 NOTE — Assessment & Plan Note (Signed)
Appears stable continue on flutter valve  Plan  Patient Instructions  Symbicort 160  2 puffs Twice daily  , rinse after use  Continue on Spiriva 2 puffs daily  Prednisone 5mg  daily  Flutter valve 2-3 times a day  as needed  Tessalon Three times a day  As needed   Activity as tolerated  Saline nasal spray Twice daily   Saline nasal gel At bedtime  .  Continue on Oxygen 4 l/m rest , 8l/m with walking/activity to keep sats >88-90%.  Follow up with Dr.  or Sharon [name] In 4- 6 months and As needed   Please contact office for sooner follow up if symptoms do not improve or worsen or seek emergency care

## 2022-02-20 ENCOUNTER — Other Ambulatory Visit: Payer: Self-pay | Admitting: Family Medicine

## 2022-02-20 DIAGNOSIS — J449 Chronic obstructive pulmonary disease, unspecified: Secondary | ICD-10-CM

## 2022-02-22 ENCOUNTER — Encounter: Payer: Self-pay | Admitting: Family Medicine

## 2022-02-22 ENCOUNTER — Ambulatory Visit: Payer: Federal, State, Local not specified - PPO | Admitting: Family Medicine

## 2022-02-22 VITALS — HR 75 | Temp 98.3°F | Ht 62.0 in

## 2022-02-22 DIAGNOSIS — E538 Deficiency of other specified B group vitamins: Secondary | ICD-10-CM

## 2022-02-22 DIAGNOSIS — J441 Chronic obstructive pulmonary disease with (acute) exacerbation: Secondary | ICD-10-CM

## 2022-02-22 DIAGNOSIS — R5383 Other fatigue: Secondary | ICD-10-CM

## 2022-02-22 DIAGNOSIS — J9612 Chronic respiratory failure with hypercapnia: Secondary | ICD-10-CM

## 2022-02-22 DIAGNOSIS — R600 Localized edema: Secondary | ICD-10-CM

## 2022-02-22 DIAGNOSIS — F320 Major depressive disorder, single episode, mild: Secondary | ICD-10-CM

## 2022-02-22 DIAGNOSIS — J9611 Chronic respiratory failure with hypoxia: Secondary | ICD-10-CM | POA: Diagnosis not present

## 2022-02-22 DIAGNOSIS — M810 Age-related osteoporosis without current pathological fracture: Secondary | ICD-10-CM | POA: Diagnosis not present

## 2022-02-22 DIAGNOSIS — E43 Unspecified severe protein-calorie malnutrition: Secondary | ICD-10-CM

## 2022-02-22 DIAGNOSIS — D649 Anemia, unspecified: Secondary | ICD-10-CM

## 2022-02-22 NOTE — Progress Notes (Signed)
Sharon [name] DOB: 1923-07-08 Encounter date: 02/22/2022  This is a 88 y.o. female who presents with Chief Complaint  Patient presents with   Follow-up    History of present illness: Last visit was 3 months ago.  At that time she had just received the okay to use morphine for episodes of difficult breathing at home if needed.  She had also just come off of prednisone taper as well as antibiotic course.  She was tired and feeling shaky.  We did increase Lexapro to 10 mg daily at last visit.  DIL states that the morphine does work immediately. Not taking daily. Usually just once/day. Typically will have on shower day. Increased work of breathing from bathroom. Using still 8-10 L with any time out of chair. Baseline 4.   Feels like cough is a little better than it was. 3 days ago cough was bad - furosemide helps, but she has a hard time with that. No bladder control with that.   Has had biopsies from derm, but has healed well from these.   Still has some discomfort in feet, toes. Using arnicare on heels.   Always has an assist for walking. Fatigues by end of day.   Takes lasix about 7 days out of month. Usually takes a few days in a row. Chest sounds better to external observation.   Allergies  Allergen Reactions   Penicillins Rash    Has patient had a PCN reaction causing immediate rash, facial/tongue/throat swelling, SOB or lightheadedness with hypotension: Yes Has patient had a PCN reaction causing severe rash involving mucus membranes or skin necrosis: Unknown Has patient had a PCN reaction that required hospitalization: Unknown Has patient had a PCN reaction occurring within the last 10 years: No If all of the above answers are "NO", then may proceed with Cephalosporin use.    Current Meds  Medication Sig   albuterol (PROVENTIL) (2.5 MG/3ML) 0.083% nebulizer solution USE 1 VIAL VIA NEBULIZER EVERY 4 HOURS AS NEEDED FOR WHEEZING OR SHORTNESS OF BREATH   ALPRAZolam (XANAX) 0.25  MG tablet TAKE 1 TABLET BY MOUTH TWICE DAILY AS NEEDED   benzonatate (TESSALON) 100 MG capsule Take by mouth 3 (three) times daily as needed for cough.   budesonide-formoterol (SYMBICORT) 160-4.5 MCG/ACT inhaler INHALE 2 PUFFS INTO THE LUNGS IN THE MORNING AND AT BEDTIME   Cholecalciferol 50000 units TABS Take 5,000 Units by mouth.   cyanocobalamin (,VITAMIN B-12,) 1000 MCG/ML injection INJECT 1 ML IN THE MUSCLE MONTHLY   Dextromethorphan-guaiFENesin (MUCINEX FAST-MAX DM MAX) 5-100 MG/5ML LIQD TAKE 20 ML BY MOUTH FOUR TIMES DAILY AS NEEDED FOR COUGH AND CONGESTION   escitalopram (LEXAPRO) 10 MG tablet Take 1 tablet (10 mg total) by mouth daily.   furosemide (LASIX) 20 MG tablet TAKE 1 TABLET(20 MG) BY MOUTH DAILY AS NEEDED   mirtazapine (REMERON) 15 MG tablet TAKE 1 TABLET(15 MG) BY MOUTH AT BEDTIME   Morphine Sulfate (MORPHINE CONCENTRATE) 10 mg / 0.5 ml concentrated solution Take by mouth. 2.5 ml daily   Multiple Vitamins-Minerals (PRESERVISION AREDS PO) Take by mouth daily.   naproxen sodium (ALEVE) 220 MG tablet Take 220 mg by mouth as needed.   OVER THE COUNTER MEDICATION OTC Acid controller-once a day  [facility]   OXYGEN 4 L/min. Oxygen 3lpm 24/7   pantoprazole (PROTONIX) 40 MG tablet TAKE 1 TABLET(40 MG) BY MOUTH DAILY 30 TO 60 MINUTES BEFORE FIRST MEAL OF THE DAY   Polyethyl Glycol-Propyl Glycol (SYSTANE OP) Place 1 drop into  both eyes daily.   polyethylene glycol powder (GLYCOLAX/MIRALAX) 17 GM/SCOOP powder DISSOLVE 17 GRAMS(1 SCOOP) IN 6-8 OZ OF FLUID AND DRINK BY MOUTH ONCE DAILY AS NEEDED FOR CONSTIPATION   predniSONE (DELTASONE) 5 MG tablet TAKE 1 TABLET BY MOUTH DAILY   sodium chloride (OCEAN) 0.65 % SOLN nasal spray Place 1 spray into both nostrils as needed for congestion.   Sulfamethoxazole-Trimethoprim (SULFATRIM PEDIATRIC PO) 20mg  daily   Tiotropium Bromide Monohydrate (SPIRIVA RESPIMAT) 2.5 MCG/ACT AERS Inhale 2 puffs into the lungs daily.   Wheat Dextrin (BENEFIBER) POWD  Take 1 Dose by mouth daily.    Review of Systems  Constitutional:  Positive for fatigue. Negative for chills and fever.  Respiratory:  Positive for cough and shortness of breath. Negative for chest tightness and wheezing.   Cardiovascular:  Negative for chest pain, palpitations and leg swelling.  Musculoskeletal:        Leg pain present, but better. Using arnicare topical. She is just sensitive to touch - ie helping hand to walk to bathroom is hard for her to tolerate, bp cuff is painful.   Objective:  Pulse 75    Temp 98.3 F (36.8 C) (Oral)    Ht 5\' 2"  (1.575 m)    SpO2 96%    BMI 21.73 kg/m       BP Readings from Last 3 Encounters:  05/18/21 100/60  02/15/21 100/60  11/17/20 112/66   Wt Readings from Last 3 Encounters:  02/03/22 118 lb 12.8 oz (53.9 kg)  11/25/21 116 lb 4.8 oz (52.8 kg)  08/02/21 115 lb 9.6 oz (52.4 kg)    Physical Exam Constitutional:      General: She is not in acute distress.    Appearance: She is well-developed.  Cardiovascular:     Rate and Rhythm: Normal rate and regular rhythm.     Heart sounds: Murmur heard.  Systolic murmur is present with a grade of 2/6.    No friction rub.  Pulmonary:     Effort: Pulmonary effort is normal. No respiratory distress.     Breath sounds: Normal breath sounds. Decreased air movement present. No wheezing or rales.     Comments: Air exchange and lungs sound pretty good today.  Musculoskeletal:     Right lower leg: No edema.     Left lower leg: No edema.  Neurological:     Mental Status: She is alert and oriented to person, place, and time.  Psychiatric:        Behavior: Behavior normal.    Assessment/Plan  1. Chronic respiratory failure with hypoxia and hypercapnia (HCC) She is doing a little bit better today than at her last visit.  Breathing is at baseline.  She remains oxygen dependent with significant increase in oxygen need with any exertion or activity.  Although it is still a chore for her to get up  and go to the bathroom, she prefers to do this rather than to have a catheter (option suggested by hospice).  She does continue to follow-up with pulmonology.  She does get morphine on occasion (2 hospice) when breathing is more labored.  Son and daughter-in-law help with her care most of the time.  Other children do come into town to help if they are not available.  2. Pedal edema She uses Lasix sparingly when she gets some lower extremity swelling.  Did not like to use this due to increased urinary frequency which is difficult for her because it requires more ambulation to the  bathroom which is inhibited by her respiratory failure.  Skin is tender and does not tolerate compression.  She does not like elevating her legs.  3. B12 deficiency Rechecking blood work today.  4. Osteoporosis, unspecified osteoporosis type, unspecified pathological fracture presence She did complete a sufficient treatment course for osteoporosis in the past (this information was obtained from prior records). - VITAMIN D 25 Hydroxy (Vit-D Deficiency, Fractures); Future - VITAMIN D 25 Hydroxy (Vit-D Deficiency, Fractures)  5. Vitamin B12 deficiency - Vitamin B12; Future - Folate; Future - Folate - Vitamin B12  6. Current mild episode of major depressive disorder without prior episode (HCC) No large difference noted with the increase in Lexapro to 10 mg daily.  I do feel like patient is in a better mood today, but at her last visit she was recovering from respiratory infection which may have affected mood.  She is not tearful and denies depression.  She is frustrated with her physical condition  7. Severe protein-calorie malnutrition (HCC) Appetite has not changed.  She seems to eat fairly well (although she has never been a large eater) - Prealbumin; Future - Prealbumin  8. Anemia, unspecified type Previously when anemia was noted, patient was treated with iron infusions, but this did not help her at all with  energy level or breathing. - CBC with Differential/Platelet; Future - Ferritin; Future - Ferritin - CBC with Differential/Platelet  19. Other fatigue  - Comprehensive metabolic panel; Future - TSH; Future - TSH - Comprehensive metabolic panel   Return in about 3 months (around 05/25/2022) for Chronic condition visit.     Micheline Rough, MD

## 2022-02-22 NOTE — Patient Instructions (Addendum)
Check on availability for Dr. Ardyth Harps.  ?Otherwise you could set up care with Dr. Barth Kirks (her schedule should open in August) ? ?Check escitalopram dose at home. Should be 10mg .  ? ? ? ?

## 2022-02-23 ENCOUNTER — Encounter: Payer: Self-pay | Admitting: Family Medicine

## 2022-02-23 LAB — CBC WITH DIFFERENTIAL/PLATELET
Basophils Absolute: 0.1 10*3/uL (ref 0.0–0.1)
Basophils Relative: 1 % (ref 0.0–3.0)
Eosinophils Absolute: 0.1 10*3/uL (ref 0.0–0.7)
Eosinophils Relative: 0.7 % (ref 0.0–5.0)
HCT: 30.7 % — ABNORMAL LOW (ref 36.0–46.0)
Hemoglobin: 10.4 g/dL — ABNORMAL LOW (ref 12.0–15.0)
Lymphocytes Relative: 9.4 % — ABNORMAL LOW (ref 12.0–46.0)
Lymphs Abs: 0.8 10*3/uL (ref 0.7–4.0)
MCHC: 33.8 g/dL (ref 30.0–36.0)
MCV: 94.8 fl (ref 78.0–100.0)
Monocytes Absolute: 0.5 10*3/uL (ref 0.1–1.0)
Monocytes Relative: 5.9 % (ref 3.0–12.0)
Neutro Abs: 7.5 10*3/uL (ref 1.4–7.7)
Neutrophils Relative %: 83 % — ABNORMAL HIGH (ref 43.0–77.0)
Platelets: 323 10*3/uL (ref 150.0–400.0)
RBC: 3.24 Mil/uL — ABNORMAL LOW (ref 3.87–5.11)
RDW: 13.2 % (ref 11.5–15.5)
WBC: 9 10*3/uL (ref 4.0–10.5)

## 2022-02-23 LAB — COMPREHENSIVE METABOLIC PANEL
ALT: 18 U/L (ref 0–35)
AST: 21 U/L (ref 0–37)
Albumin: 4.3 g/dL (ref 3.5–5.2)
Alkaline Phosphatase: 50 U/L (ref 39–117)
BUN: 14 mg/dL (ref 6–23)
CO2: 31 mEq/L (ref 19–32)
Calcium: 9.8 mg/dL (ref 8.4–10.5)
Chloride: 90 mEq/L — ABNORMAL LOW (ref 96–112)
Creatinine, Ser: 0.79 mg/dL (ref 0.40–1.20)
GFR: 62.96 mL/min (ref >60.00)
Glucose, Bld: 97 mg/dL (ref 70–99)
Potassium: 4.7 mEq/L (ref 3.5–5.1)
Sodium: 129 mEq/L — ABNORMAL LOW (ref 135–145)
Total Bilirubin: 0.4 mg/dL (ref 0.2–1.2)
Total Protein: 6.9 g/dL (ref 6.0–8.3)

## 2022-02-23 LAB — VITAMIN D 25 HYDROXY (VIT D DEFICIENCY, FRACTURES): VITD: 65.6 ng/mL (ref 30.00–100.00)

## 2022-02-23 LAB — VITAMIN B12: Vitamin B-12: 320 pg/mL (ref 211–911)

## 2022-02-23 LAB — FERRITIN: Ferritin: 65.2 ng/mL (ref 10.0–291.0)

## 2022-02-23 LAB — TSH: TSH: 2.38 u[IU]/mL (ref 0.35–5.50)

## 2022-02-23 LAB — PREALBUMIN: Prealbumin: 23 mg/dL (ref 17–34)

## 2022-02-23 LAB — FOLATE: Folate: >24.2 ng/mL (ref >5.9)

## 2022-02-25 ENCOUNTER — Other Ambulatory Visit: Payer: Self-pay | Admitting: Family Medicine

## 2022-02-25 DIAGNOSIS — F419 Anxiety disorder, unspecified: Secondary | ICD-10-CM

## 2022-03-07 ENCOUNTER — Other Ambulatory Visit: Payer: Self-pay | Admitting: Family Medicine

## 2022-03-08 ENCOUNTER — Other Ambulatory Visit: Payer: Self-pay | Admitting: Family Medicine

## 2022-03-27 ENCOUNTER — Other Ambulatory Visit: Payer: Self-pay | Admitting: Internal Medicine

## 2022-04-14 ENCOUNTER — Telehealth: Payer: Self-pay | Admitting: Family Medicine

## 2022-04-14 NOTE — Telephone Encounter (Signed)
[name] from [facility] requesting verbal order to continue hospice care.  ?

## 2022-04-14 NOTE — Telephone Encounter (Signed)
Spoke with [name] and informed her of the approval for orders as below.  ?

## 2022-04-14 NOTE — Telephone Encounter (Signed)
ok 

## 2022-04-22 ENCOUNTER — Other Ambulatory Visit: Payer: Self-pay | Admitting: Family Medicine

## 2022-04-22 DIAGNOSIS — F419 Anxiety disorder, unspecified: Secondary | ICD-10-CM

## 2022-05-16 ENCOUNTER — Other Ambulatory Visit: Payer: Self-pay | Admitting: Family Medicine

## 2022-06-21 ENCOUNTER — Ambulatory Visit: Payer: Federal, State, Local not specified - PPO | Admitting: Internal Medicine

## 2022-06-21 ENCOUNTER — Encounter: Payer: Self-pay | Admitting: Internal Medicine

## 2022-06-21 VITALS — HR 88 | Temp 98.5°F | Ht 62.0 in | Wt 117.0 lb

## 2022-06-21 DIAGNOSIS — J9611 Chronic respiratory failure with hypoxia: Secondary | ICD-10-CM

## 2022-06-21 DIAGNOSIS — E43 Unspecified severe protein-calorie malnutrition: Secondary | ICD-10-CM | POA: Diagnosis not present

## 2022-06-21 DIAGNOSIS — J449 Chronic obstructive pulmonary disease, unspecified: Secondary | ICD-10-CM

## 2022-06-21 DIAGNOSIS — J9612 Chronic respiratory failure with hypercapnia: Secondary | ICD-10-CM | POA: Diagnosis not present

## 2022-06-21 NOTE — Progress Notes (Signed)
Established Patient Office Visit     CC/Reason for Visit: Establish care, discuss chronic medical conditions  HPI: Sharon [name] is a 88 y.o. female who is coming in today for the above mentioned reasons. Past Medical History is significant for: Chronic hypoxemic and hypercapnic respiratory failure secondary to advanced COPD on chronic oxygen 4 to 8 L, daily prednisone usage and morphine as needed.  Hospice is involved in her care.  Her previous PCP has left the office and as such is needing a new one.  That is the main reason for her visit today.  No need for medication refills.  She has a pulmonologist that she sees every few months.  She has not had any recent exacerbations.  She is totally care dependent due to her advanced dyspnea on exertion.  She is here today with her son and daughter-in-law.  She mentions left foot pain that is especially worse at night.  She has clear purplish skin color changes of her left foot.  Foot is purplish in color.  This is not acute, has been present for at least 6 months or longer.  Hospice is aware and has mentioned that she has circulation to the family now for some time.   Past Medical/Surgical History: Past Medical History:  Diagnosis Date   Anemia    B12 DEFICIENCY 03/31/2008   Bronchiectasis with acute exacerbation (HCC) 12/24/2018   COPD 11/27/2008   COPD exacerbation (HCC) 11/27/2008   Qualifier: Diagnosis of  By: Amador Cunas  MD, Janett Labella    ESOPHAGEAL STRICTURE 01/29/2009   MASS, LUNG 10/13/2009   NASAL FRACTURE 08/12/2009   OSTEOARTHRITIS 03/31/2008   Hips   OSTEOPOROSIS 12/27/2007   PEDAL EDEMA 12/27/2007   Pneumonia 07/10/2016   PULMONARY NODULE 10/21/2009   RESPIRATORY FAILURE, CHRONIC 06/29/2010   Shortness of breath dyspnea     Past Surgical History:  Procedure Laterality Date   APPENDECTOMY     '38   BALLOON DILATION N/A 05/10/2015   Procedure: BALLOON DILATION;  Surgeon: Louis Meckel, MD;  Location: [facility];  Service:  Endoscopy;  Laterality: N/A;   BALLOON DILATION N/A 08/02/2015   Procedure: BALLOON DILATION;  Surgeon: Louis Meckel, MD;  Location: [facility];  Service: Endoscopy;  Laterality: N/A;   BREAST SURGERY     biopsy '47   CATARACT EXTRACTION Bilateral    last '05   CHOLECYSTECTOMY     laparoscopic'10   ESOPHAGOGASTRODUODENOSCOPY     with dilatation x3   ESOPHAGOGASTRODUODENOSCOPY (EGD) WITH PROPOFOL N/A 05/10/2015   Procedure: ESOPHAGOGASTRODUODENOSCOPY (EGD) WITH PROPOFOL;  Surgeon: Louis Meckel, MD;  Location: [facility];  Service: Endoscopy;  Laterality: N/A;  balloon dilation   ESOPHAGOGASTRODUODENOSCOPY (EGD) WITH PROPOFOL N/A 08/02/2015   Procedure: ESOPHAGOGASTRODUODENOSCOPY (EGD) WITH PROPOFOL;  Surgeon: Louis Meckel, MD;  Location: [facility];  Service: Endoscopy;  Laterality: N/A;   LUMBAR LAMINECTOMY     NASAL RECONSTRUCTION WITH SEPTAL REPAIR     '10   RECTAL PROLAPSE REPAIR     '06   skin graf     '98    Social History:  reports that she quit smoking about 43 years ago. Her smoking use included cigarettes. She started smoking about 77 years ago. She has a 17.50 pack-year smoking history. She has never used smokeless tobacco. She reports current alcohol use. She reports that she does not use drugs.  Allergies: Allergies  Allergen Reactions   Penicillins Rash    Has patient had  a PCN reaction causing immediate rash, facial/tongue/throat swelling, SOB or lightheadedness with hypotension: Yes Has patient had a PCN reaction causing severe rash involving mucus membranes or skin necrosis: Unknown Has patient had a PCN reaction that required hospitalization: Unknown Has patient had a PCN reaction occurring within the last 10 years: No If all of the above answers are "NO", then may proceed with Cephalosporin use.     Family History:  Family History  Problem Relation Age of Onset   Hypertension Son    Asthma Mother    Abdominal Wall Hernia Mother    Stomach  cancer Mother      Current Outpatient Medications:    albuterol (PROVENTIL) (2.5 MG/3ML) 0.083% nebulizer solution, USE 1 VIAL VIA NEBULIZER EVERY 4 HOURS AS NEEDED FOR WHEEZING OR SHORTNESS OF BREATH, Disp: 225 mL, Rfl: 5   ALPRAZolam (XANAX) 0.25 MG tablet, TAKE 1 TABLET BY MOUTH TWICE DAILY AS NEEDED, Disp: 60 tablet, Rfl: 2   benzonatate (TESSALON) 100 MG capsule, Take by mouth 3 (three) times daily as needed for cough., Disp: , Rfl:    budesonide-formoterol (SYMBICORT) 160-4.5 MCG/ACT inhaler, INHALE 2 PUFFS INTO THE LUNGS IN THE MORNING AND AT BEDTIME, Disp: 10.2 g, Rfl: 11   Cholecalciferol 50000 units TABS, Take 5,000 Units by mouth., Disp: , Rfl:    cyanocobalamin (,VITAMIN B-12,) 1000 MCG/ML injection, INJECT 1 ML IN THE MUSCLE MONTHLY, Disp: 3 mL, Rfl: 3   escitalopram (LEXAPRO) 10 MG tablet, TAKE 1 TABLET BY MOUTH EVERY DAY. STOP 5MG , Disp: 30 tablet, Rfl: 2   furosemide (LASIX) 20 MG tablet, TAKE 1 TABLET(20 MG) BY MOUTH DAILY AS NEEDED, Disp: 30 tablet, Rfl: 2   mirtazapine (REMERON) 15 MG tablet, TAKE 1 TABLET(15 MG) BY MOUTH AT BEDTIME, Disp: 90 tablet, Rfl: 1   Morphine Sulfate (MORPHINE CONCENTRATE) 10 mg / 0.5 ml concentrated solution, Take by mouth as needed. 2.5 ml daily, Disp: , Rfl:    MUCINEX FAST-MAX DM MAX 20-400 MG/20ML LIQD, TAKE 20 ML BY MOUTH FOUR TIMES DAILY AS NEEDED FOR COUGH AND CONGESTION, Disp: 1775 mL, Rfl: 0   Multiple Vitamins-Minerals (PRESERVISION AREDS PO), Take by mouth daily., Disp: , Rfl:    naproxen sodium (ALEVE) 220 MG tablet, Take 220 mg by mouth as needed., Disp: , Rfl:    OVER THE COUNTER MEDICATION, OTC Acid controller-once a day  [facility], Disp: , Rfl:    OXYGEN, 4 L/min. Oxygen 3lpm 24/7, Disp: , Rfl:    pantoprazole (PROTONIX) 40 MG tablet, TAKE 1 TABLET(40 MG) BY MOUTH DAILY 30 TO 60 MINUTES BEFORE FIRST MEAL OF THE DAY, Disp: 90 tablet, Rfl: 1   Polyethyl Glycol-Propyl Glycol (SYSTANE OP), Place 1 drop into both eyes daily., Disp: , Rfl:     polyethylene glycol powder (GLYCOLAX/MIRALAX) 17 GM/SCOOP powder, DISSOLVE 17 GRAMS(1 SCOOP) IN 6-8 OZ OF FLUID AND DRINK BY MOUTH ONCE DAILY AS NEEDED FOR CONSTIPATION, Disp: 238 g, Rfl: 2   predniSONE (DELTASONE) 5 MG tablet, TAKE 1 TABLET BY MOUTH DAILY, Disp: 90 tablet, Rfl: 1   sodium chloride (OCEAN) 0.65 % SOLN nasal spray, Place 1 spray into both nostrils as needed for congestion., Disp: , Rfl:    SPIRIVA RESPIMAT 2.5 MCG/ACT AERS, INHALE 2 PUFFS INTO THE LUNGS DAILY, Disp: 4 g, Rfl: 5   Sulfamethoxazole-Trimethoprim (SULFATRIM PEDIATRIC PO), 20mg  daily, Disp: , Rfl:    Wheat Dextrin (BENEFIBER) POWD, Take 1 Dose by mouth daily., Disp: , Rfl:   Review of Systems:  Constitutional:  Denies fever, chills, diaphoresis, appetite change and fatigue.  HEENT: Denies photophobia, eye pain, redness, hearing loss, ear pain, congestion, sore throat, rhinorrhea, sneezing, mouth sores, trouble swallowing, neck pain, neck stiffness and tinnitus.   Respiratory: Positive for SOB, DOE, cough, chest tightness,  and wheezing.   Cardiovascular: Denies chest pain, palpitations. Gastrointestinal: Denies nausea, vomiting, abdominal pain, diarrhea, constipation, blood in stool and abdominal distention.  Genitourinary: Denies dysuria, urgency, frequency, hematuria, flank pain and difficulty urinating.  Endocrine: Denies: hot or cold intolerance, sweats, changes in hair or nails, polyuria, polydipsia. Musculoskeletal: Denies myalgias, back pain, joint swelling, arthralgias and gait problem.  Skin: Denies pallor, rash and wound.  Neurological: Denies dizziness, seizures, syncope, light-headedness, numbness and headaches.  Hematological: Denies adenopathy. Easy bruising, personal or family bleeding history  Psychiatric/Behavioral: Denies suicidal ideation, mood changes, confusion, nervousness, sleep disturbance and agitation    Physical Exam: Vitals:   06/21/22 1519  Pulse: 88  Temp: 98.5 F (36.9 C)   TempSrc: Oral  SpO2: 98%  Weight: 117 lb (53.1 kg)  Height: 5\' 2"  (1.575 m)    Body mass index is 21.4 kg/m.   Constitutional: NAD, calm, comfortable Eyes: PERRL, lids and conjunctivae normal ENMT: Mucous membranes are moist.  Respiratory: clear to auscultation bilaterally, no wheezing, no crackles. Normal respiratory effort. No accessory muscle use.  Cardiovascular: Regular rate and rhythm, no murmurs / rubs / gallops.  Skin: Purplish discoloration left foot Psychiatric: Normal judgment and insight. Alert and oriented x 3. Normal mood.    Impression and Plan:  Chronic respiratory failure with hypoxia and hypercapnia (HCC)  COPD GOLD III   Severe protein-calorie malnutrition (HCC)  -She has end-stage COPD that is oxygen dependent at 4 L at rest and up to 8 to 10 L with minimal exertion.  She is also on prednisone 5 mg daily and is under hospice care and receives morphine as needed for shortness of breath.  This is at her baseline.  She follows with pulmonary routinely. -Purplish discoloration of the left foot is noted.  She does have significant nighttime pain to that foot.  Both patient and family state that this has been present for at least 6 months if not longer.  They understand that there would be very limited treatment options for her given her advanced COPD and as such we have decided to defer arterial Dopplers.  Time spent:31 minutes reviewing chart, interviewing and examining patient and formulating plan of care.     , MD [facility]

## 2022-07-03 ENCOUNTER — Ambulatory Visit: Payer: Federal, State, Local not specified - PPO | Admitting: Adult Health

## 2022-07-06 ENCOUNTER — Ambulatory Visit (INDEPENDENT_AMBULATORY_CARE_PROVIDER_SITE_OTHER): Payer: Federal, State, Local not specified - PPO | Admitting: Adult Health

## 2022-07-06 ENCOUNTER — Ambulatory Visit (INDEPENDENT_AMBULATORY_CARE_PROVIDER_SITE_OTHER): Payer: Federal, State, Local not specified - PPO

## 2022-07-06 ENCOUNTER — Encounter: Payer: Self-pay | Admitting: Adult Health

## 2022-07-06 VITALS — HR 82 | Temp 98.3°F | Ht 65.0 in | Wt 115.0 lb

## 2022-07-06 DIAGNOSIS — E43 Unspecified severe protein-calorie malnutrition: Secondary | ICD-10-CM

## 2022-07-06 DIAGNOSIS — J441 Chronic obstructive pulmonary disease with (acute) exacerbation: Secondary | ICD-10-CM

## 2022-07-06 DIAGNOSIS — R300 Dysuria: Secondary | ICD-10-CM | POA: Diagnosis not present

## 2022-07-06 DIAGNOSIS — J9611 Chronic respiratory failure with hypoxia: Secondary | ICD-10-CM | POA: Diagnosis not present

## 2022-07-06 DIAGNOSIS — J9612 Chronic respiratory failure with hypercapnia: Secondary | ICD-10-CM

## 2022-07-06 DIAGNOSIS — R0602 Shortness of breath: Secondary | ICD-10-CM | POA: Diagnosis not present

## 2022-07-06 LAB — CBC WITH DIFFERENTIAL/PLATELET
Basophils Absolute: 0.1 10*3/uL (ref 0.0–0.1)
Basophils Relative: 0.6 % (ref 0.0–3.0)
Eosinophils Absolute: 0 10*3/uL (ref 0.0–0.7)
Eosinophils Relative: 0.3 % (ref 0.0–5.0)
HCT: 30.6 % — ABNORMAL LOW (ref 36.0–46.0)
Hemoglobin: 10 g/dL — ABNORMAL LOW (ref 12.0–15.0)
Lymphocytes Relative: 9.1 % — ABNORMAL LOW (ref 12.0–46.0)
Lymphs Abs: 0.9 10*3/uL (ref 0.7–4.0)
MCHC: 32.8 g/dL (ref 30.0–36.0)
MCV: 93.9 fl (ref 78.0–100.0)
Monocytes Absolute: 0.7 10*3/uL (ref 0.1–1.0)
Monocytes Relative: 6.8 % (ref 3.0–12.0)
Neutro Abs: 8.5 10*3/uL — ABNORMAL HIGH (ref 1.4–7.7)
Neutrophils Relative %: 83.2 % — ABNORMAL HIGH (ref 43.0–77.0)
Platelets: 350 10*3/uL (ref 150.0–400.0)
RBC: 3.25 Mil/uL — ABNORMAL LOW (ref 3.87–5.11)
RDW: 14.2 % (ref 11.5–15.5)
WBC: 10.2 10*3/uL (ref 4.0–10.5)

## 2022-07-06 LAB — BASIC METABOLIC PANEL
BUN: 16 mg/dL (ref 6–23)
CO2: 28 mEq/L (ref 19–32)
Calcium: 9.7 mg/dL (ref 8.4–10.5)
Chloride: 93 mEq/L — ABNORMAL LOW (ref 96–112)
Creatinine, Ser: 0.63 mg/dL (ref 0.40–1.20)
GFR: 74.47 mL/min (ref >60.00)
Glucose, Bld: 121 mg/dL — ABNORMAL HIGH (ref 70–99)
Potassium: 4.7 mEq/L (ref 3.5–5.1)
Sodium: 131 mEq/L — ABNORMAL LOW (ref 135–145)

## 2022-07-06 LAB — URINALYSIS, ROUTINE W REFLEX MICROSCOPIC
Bilirubin Urine: NEGATIVE
Hgb urine dipstick: NEGATIVE
Ketones, ur: NEGATIVE
Nitrite: NEGATIVE
Specific Gravity, Urine: 1.02 (ref 1.000–1.030)
Total Protein, Urine: NEGATIVE
Urine Glucose: NEGATIVE
Urobilinogen, UA: 1 (ref 0.0–1.0)
pH: 6 (ref 5.0–8.0)

## 2022-07-06 LAB — BRAIN NATRIURETIC PEPTIDE: Pro B Natriuretic peptide (BNP): 78 pg/mL (ref 0.0–100.0)

## 2022-07-06 MED ORDER — ARFORMOTEROL TARTRATE 15 MCG/2ML IN NEBU: 15.0000 ug | INHALATION_SOLUTION | Freq: Two times a day (BID) | RESPIRATORY_TRACT | 5 refills | Status: DC

## 2022-07-06 MED ORDER — BUDESONIDE 0.5 MG/2ML IN SUSP: 0.5000 mg | Freq: Two times a day (BID) | RESPIRATORY_TRACT | 5 refills | Status: AC

## 2022-07-06 MED ORDER — IPRATROPIUM BROMIDE 0.02 % IN SOLN: 0.5000 mg | Freq: Three times a day (TID) | RESPIRATORY_TRACT | 5 refills | Status: AC

## 2022-07-06 MED ORDER — PREDNISONE 10 MG PO TABS: 10.0000 mg | ORAL_TABLET | Freq: Every day | ORAL | 0 refills | Status: AC

## 2022-07-06 NOTE — Assessment & Plan Note (Signed)
Check UA/Urine cx

## 2022-07-06 NOTE — Assessment & Plan Note (Signed)
Cont with boost daily

## 2022-07-06 NOTE — Progress Notes (Signed)
@Patient  ID: Sharon [name], female    DOB: [birth year], 88 y.o.   MRN: [id number]  Chief Complaint  Patient presents with   Follow-up    Referring provider: Caren Macadam, MD  HPI: 88 year old female former smoker quit in 1980 followed for severe COPD, bronchiectasis, chronic respiratory failure on oxygen at 4 L Medical history significant for A-fib  TEST/EVENTS :  12/03/2009-FEV1 67 (40%) with 12% response to bronchodilator and DLCO 55%  07/06/2022 Follow up : COPD , O2 RF  Patient presents for a 53-month follow-up.  Patient has underlying severe COPD that is oxygen dependent,   Overall says breathing is getting worse.  Says that she is getting more more short of breath with minimum activities.  With decreased activity tolerance.  Does feel over the last week that breathing has been worse.  Feels that she is weaker.  She is on hospice care at home.  Typically uses morphine a few times a week when she gets panic with her breathing.  Typically takes it at night very low-dose.  This does seem to be helping.  She denies any increased cough or congestion.  No fever.  No hemoptysis.  No chest pain or orthopnea and no increased leg swelling.  Appetite is fair she does eat a good breakfast uses boost during the daytime.  And eats a decent dinner..  She remains on oxygen 4 L at rest and has to use 8 L with activity.   She is on hospice at home.  Uses morphine daily.  She remains on Symbicort and Spiriva.  Uses albuterol nebulizer 3 times daily.  Prednisone 5 mg daily. She has extensive family that helps her.  She has 6 children.  She is very sedentary.  Has to have assistance to walk to the bathroom.  She is here today with her son Sharon [name]    Allergies  Allergen Reactions   Penicillins Rash    Has patient had a PCN reaction causing immediate rash, facial/tongue/throat swelling, SOB or lightheadedness with hypotension: Yes Has patient had a PCN reaction causing severe rash involving mucus  membranes or skin necrosis: Unknown Has patient had a PCN reaction that required hospitalization: Unknown Has patient had a PCN reaction occurring within the last 10 years: No If all of the above answers are "NO", then may proceed with Cephalosporin use.     Immunization History  Administered Date(s) Administered   Fluad Quad(high Dose 65+) 08/12/2019, 10/13/2021   Influenza Split 09/26/2011, 09/27/2012   Influenza Whole 09/30/2009   Influenza, High Dose Seasonal PF 09/24/2013, 01/10/2016, 09/21/2016, 09/25/2017, 09/18/2018   Influenza-Unspecified 10/13/2014, 09/13/2020   PFIZER(Purple Top)SARS-COV-2 Vaccination 12/29/2019, 01/19/2020, 09/13/2020, 04/08/2021   PNEUMOCOCCAL CONJUGATE-20 11/25/2021   Pfizer Covid-19 Vaccine Bivalent Booster 50yrs & up 09/05/2021   Pneumococcal Conjugate-13 02/11/2014   Pneumococcal Polysaccharide-23 05/05/2009   Td 05/05/2009    Past Medical History:  Diagnosis Date   Anemia    B12 DEFICIENCY 03/31/2008   Bronchiectasis with acute exacerbation ([place]) 12/24/2018   COPD 11/27/2008   COPD exacerbation ([place]) 11/27/2008   Qualifier: Diagnosis of  By: Burnice Logan  MD, Doretha Sou    ESOPHAGEAL STRICTURE 01/29/2009   MASS, LUNG 10/13/2009   NASAL FRACTURE 08/12/2009   OSTEOARTHRITIS 03/31/2008   Hips   OSTEOPOROSIS 12/27/2007   PEDAL EDEMA 12/27/2007   Pneumonia 07/10/2016   PULMONARY NODULE 10/21/2009   RESPIRATORY FAILURE, CHRONIC 06/29/2010   Shortness of breath dyspnea     Tobacco History: Social History  Tobacco Use  Smoking Status Former   Packs/day: 0.50   Years: 35.00   Total pack years: 17.50   Types: Cigarettes   Start date: 1946   Quit date: 12/18/1978   Years since quitting: 43.5  Smokeless Tobacco Never   Counseling given: Not Answered   Outpatient Medications Prior to Visit  Medication Sig Dispense Refill   albuterol (PROVENTIL) (2.5 MG/3ML) 0.083% nebulizer solution USE 1 VIAL VIA NEBULIZER EVERY 4 HOURS AS NEEDED FOR WHEEZING OR  SHORTNESS OF BREATH 225 mL 5   ALPRAZolam (XANAX) 0.25 MG tablet TAKE 1 TABLET BY MOUTH TWICE DAILY AS NEEDED 60 tablet 2   benzonatate (TESSALON) 100 MG capsule Take by mouth 3 (three) times daily as needed for cough.     Cholecalciferol 50000 units TABS Take 5,000 Units by mouth.     cyanocobalamin (,VITAMIN B-12,) 1000 MCG/ML injection INJECT 1 ML IN THE MUSCLE MONTHLY 3 mL 3   escitalopram (LEXAPRO) 10 MG tablet TAKE 1 TABLET BY MOUTH EVERY DAY. STOP 5MG  30 tablet 2   furosemide (LASIX) 20 MG tablet TAKE 1 TABLET(20 MG) BY MOUTH DAILY AS NEEDED 30 tablet 2   mirtazapine (REMERON) 15 MG tablet TAKE 1 TABLET(15 MG) BY MOUTH AT BEDTIME 90 tablet 1   Morphine Sulfate (MORPHINE CONCENTRATE) 10 mg / 0.5 ml concentrated solution Take by mouth as needed. 2.5 ml daily     MUCINEX FAST-MAX DM MAX 20-400 MG/20ML LIQD TAKE 20 ML BY MOUTH FOUR TIMES DAILY AS NEEDED FOR COUGH AND CONGESTION 1775 mL 0   Multiple Vitamins-Minerals (PRESERVISION AREDS PO) Take by mouth daily.     naproxen sodium (ALEVE) 220 MG tablet Take 220 mg by mouth as needed.     OVER THE COUNTER MEDICATION OTC Acid controller-once a day  [facility]     OXYGEN 4 L/min. Oxygen 3lpm 24/7     pantoprazole (PROTONIX) 40 MG tablet TAKE 1 TABLET(40 MG) BY MOUTH DAILY 30 TO 60 MINUTES BEFORE FIRST MEAL OF THE DAY 90 tablet 1   Polyethyl Glycol-Propyl Glycol (SYSTANE OP) Place 1 drop into both eyes daily.     polyethylene glycol powder (GLYCOLAX/MIRALAX) 17 GM/SCOOP powder DISSOLVE 17 GRAMS(1 SCOOP) IN 6-8 OZ OF FLUID AND DRINK BY MOUTH ONCE DAILY AS NEEDED FOR CONSTIPATION 238 g 2   predniSONE (DELTASONE) 5 MG tablet TAKE 1 TABLET BY MOUTH DAILY 90 tablet 1   sodium chloride (OCEAN) 0.65 % SOLN nasal spray Place 1 spray into both nostrils as needed for congestion.     Sulfamethoxazole-Trimethoprim (SULFATRIM PEDIATRIC PO) 20mg  daily     Wheat Dextrin (BENEFIBER) POWD Take 1 Dose by mouth daily.     budesonide-formoterol (SYMBICORT) 160-4.5  MCG/ACT inhaler INHALE 2 PUFFS INTO THE LUNGS IN THE MORNING AND AT BEDTIME 10.2 g 11   SPIRIVA RESPIMAT 2.5 MCG/ACT AERS INHALE 2 PUFFS INTO THE LUNGS DAILY 4 g 5   No facility-administered medications prior to visit.     Review of Systems:   Constitutional:   No  weight loss, night sweats,  Fevers, chills, + fatigue, or  lassitude.  HEENT:   No headaches,  Difficulty swallowing,  Tooth/dental problems, or  Sore throat,                No sneezing, itching, ear ache, nasal congestion, post nasal drip,   CV:  No chest pain,  Orthopnea, PND, swelling in lower extremities, anasarca, dizziness, palpitations, syncope.   GI  No heartburn, indigestion, abdominal pain, nausea,  vomiting, diarrhea, change in bowel habits, loss of appetite, bloody stools.   Resp:  No chest wall deformity  Skin: no rash or lesions.  GU: no dysuria, change in color of urine, no urgency or frequency.  No flank pain, no hematuria   MS:  No joint pain or swelling.  No decreased range of motion.  No back pain.    Physical Exam  Pulse 82   Temp 98.3 F (36.8 C) (Oral)   Ht 5\' 5"  (1.651 m)   Wt 115 lb (52.2 kg)   SpO2 90%   BMI 19.14 kg/m   GEN: A/Ox3; pleasant , NAD, elderly, chronically ill-appearing, in wheelchair, on oxygen  HEENT:  [place]/AT,  NOSE-clear, THROAT-clear, no lesions, no postnasal drip or exudate noted.   NECK:  Supple w/ fair ROM; no JVD; normal carotid impulses w/o bruits; no thyromegaly or nodules palpated; no lymphadenopathy.    RESP scattered rhonchi bilaterally accessory muscle use, no dullness to percussion  CARD:  RRR, no m/r/g, tr  peripheral edema, pulses intact, no cyanosis or clubbing.  GI:   Soft & nt; nml bowel sounds; no organomegaly or masses detected.   Musco: Warm bil, no deformities or joint swelling noted.   Neuro: alert, no focal deficits noted.    Skin: Warm, no lesions or rashes    Lab Results:   BNP No results found for: "BNP"    Imaging: No  results found.        No data to display          No results found for: "NITRICOXIDE"      Assessment & Plan:   COPD with acute exacerbation ([place]) Progressive COPD decline versus COPD exacerbation.  Check chest x-ray today.  Hold on antibiotics at this time.  Will increase prednisone up to 10 mg daily for the next 2 weeks and then resume 5 mg daily dosing.  We will change Symbicort and Spiriva over to nebulized form with budesonide and Brovana nebulizers twice daily.  And add in ipratropium to her albuterol nebs 3 times daily.  Plan  Patient Instructions  Stop Symbicort and Spiriva  Begin Budesonide and Brovana Neb Twice daily  (Can mix together)  Add Ipratropium Neb Three times a day (to albuterol neb)  Increase Prednisone 10mg  daily for 2 weeks then 5mg  daily  Restart Flutter valve 2-3 times a day  as needed  Tessalon Three times a day  As needed   Activity as tolerated  Saline nasal spray Twice daily   Saline nasal gel At bedtime  .  Labs and chest xray .  Continue on Oxygen 4 l/m rest , 8l/m with walking/activity to keep sats >88-90%.  Follow up with Dr. Melvyn Novas  or Zelpha Messing In  6 weeks  Please contact office for sooner follow up if symptoms do not improve or worsen or seek emergency care          Chronic respiratory failure with hypoxia and hypercapnia ([place]) Progressive decline with high oxygen demands  Continue on O2 to keep sats >88-90%.  Continue wit hospice home care   Plan  Patient Instructions  Stop Symbicort and Spiriva  Begin Budesonide and Brovana Neb Twice daily  (Can mix together)  Add Ipratropium Neb Three times a day (to albuterol neb)  Increase Prednisone 10mg  daily for 2 weeks then 5mg  daily  Restart Flutter valve 2-3 times a day  as needed  Tessalon Three times a day  As needed   Activity as tolerated  Saline nasal spray Twice daily   Saline nasal gel At bedtime  .  Labs and chest xray .  Continue on Oxygen 4 l/m rest , 8l/m with  walking/activity to keep sats >88-90%.  Follow up with Dr. Sherene Sires  or Cleotha Whalin In  6 weeks  Please contact office for sooner follow up if symptoms do not improve or worsen or seek emergency care          Dysuria Check UA/Urine cx   Protein calorie malnutrition ([facility]) Cont with boost daily    I spent  42  minutes dedicated to the care of this patient on the date of this encounter to include pre-visit review of records, face-to-face time with the patient discussing conditions above, post visit ordering of testing, clinical documentation with the electronic health record, making appropriate referrals as documented, and communicating necessary findings to members of the patients care team.   Rubye Oaks, NP 07/06/2022

## 2022-07-06 NOTE — Assessment & Plan Note (Signed)
Progressive COPD decline versus COPD exacerbation.  Check chest x-ray today.  Hold on antibiotics at this time.  Will increase prednisone up to 10 mg daily for the next 2 weeks and then resume 5 mg daily dosing.  We will change Symbicort and Spiriva over to nebulized form with budesonide and Brovana nebulizers twice daily.  And add in ipratropium to her albuterol nebs 3 times daily.  Plan  Patient Instructions  Stop Symbicort and Spiriva  Begin Budesonide and Brovana Neb Twice daily  (Can mix together)  Add Ipratropium Neb Three times a day (to albuterol neb)  Increase Prednisone 10mg  daily for 2 weeks then 5mg  daily  Restart Flutter valve 2-3 times a day  as needed  Tessalon Three times a day  As needed   Activity as tolerated  Saline nasal spray Twice daily   Saline nasal gel At bedtime  .  Labs and chest xray .  Continue on Oxygen 4 l/m rest , 8l/m with walking/activity to keep sats >88-90%.  Follow up with Dr.  or Toniesha Zellner In  6 weeks  Please contact office for sooner follow up if symptoms do not improve or worsen or seek emergency care

## 2022-07-06 NOTE — Patient Instructions (Addendum)
Stop Symbicort and Spiriva  Begin Budesonide and Brovana Neb Twice daily  (Can mix together)  Add Ipratropium Neb Three times a day (to albuterol neb)  Increase Prednisone 10mg  daily for 2 weeks then 5mg  daily  Restart Flutter valve 2-3 times a day  as needed  Tessalon Three times a day  As needed   Activity as tolerated  Saline nasal spray Twice daily   Saline nasal gel At bedtime  .  Labs and chest xray .  Continue on Oxygen 4 l/m rest , 8l/m with walking/activity to keep sats >88-90%.  Follow up with Dr.  or Airen Dales In  6 weeks  Please contact office for sooner follow up if symptoms do not improve or worsen or seek emergency care

## 2022-07-06 NOTE — Assessment & Plan Note (Addendum)
Progressive decline with high oxygen demands  Continue on O2 to keep sats >88-90%.  Continue wit hospice home care   Plan  Patient Instructions  Stop Symbicort and Spiriva  Begin Budesonide and Brovana Neb Twice daily  (Can mix together)  Add Ipratropium Neb Three times a day (to albuterol neb)  Increase Prednisone 10mg  daily for 2 weeks then 5mg  daily  Restart Flutter valve 2-3 times a day  as needed  Tessalon Three times a day  As needed   Activity as tolerated  Saline nasal spray Twice daily   Saline nasal gel At bedtime  .  Labs and chest xray .  Continue on Oxygen 4 l/m rest , 8l/m with walking/activity to keep sats >88-90%.  Follow up with Dr.  or Odie Edmonds In  6 weeks  Please contact office for sooner follow up if symptoms do not improve or worsen or seek emergency care

## 2022-07-06 NOTE — Progress Notes (Signed)
Patient refuses BP, cuff hurts her skin.  Hospice visits her at home and saw her today.

## 2022-07-08 LAB — URINE CULTURE
MICRO NUMBER:: 13673215
SPECIMEN QUALITY:: ADEQUATE

## 2022-07-10 ENCOUNTER — Other Ambulatory Visit: Payer: Self-pay

## 2022-07-10 ENCOUNTER — Telehealth: Payer: Self-pay | Admitting: Adult Health

## 2022-07-10 MED ORDER — LEVOFLOXACIN 500 MG PO TABS: 500.0000 mg | ORAL_TABLET | Freq: Every day | ORAL | 0 refills | Status: DC

## 2022-07-10 NOTE — Telephone Encounter (Signed)
Called Caryn Bee back and went over urine culture results. Updated him on medication that was sent in. Nothing further needed

## 2022-07-11 ENCOUNTER — Other Ambulatory Visit (HOSPITAL_COMMUNITY): Payer: Self-pay

## 2022-07-13 ENCOUNTER — Telehealth: Payer: Self-pay | Admitting: Adult Health

## 2022-07-13 DIAGNOSIS — J441 Chronic obstructive pulmonary disease with (acute) exacerbation: Secondary | ICD-10-CM

## 2022-07-14 MED ORDER — ARFORMOTEROL TARTRATE 15 MCG/2ML IN NEBU: 15.0000 ug | INHALATION_SOLUTION | Freq: Two times a day (BID) | RESPIRATORY_TRACT | 5 refills | Status: AC

## 2022-07-14 NOTE — Telephone Encounter (Signed)
Resending in rx for Brovana to the pharmacy. Nothing further needed

## 2022-07-24 ENCOUNTER — Telehealth: Payer: Self-pay | Admitting: Internal Medicine

## 2022-07-24 DIAGNOSIS — J449 Chronic obstructive pulmonary disease, unspecified: Secondary | ICD-10-CM

## 2022-07-24 MED ORDER — PANTOPRAZOLE SODIUM 40 MG PO TBEC: DELAYED_RELEASE_TABLET | ORAL | 1 refills | Status: AC

## 2022-07-24 NOTE — Telephone Encounter (Signed)
Refill sent.

## 2022-07-24 NOTE — Telephone Encounter (Signed)
Requesting refill of pantoprazole (PROTONIX) 40 MG tablet  pt is out

## 2022-07-25 ENCOUNTER — Telehealth: Payer: Self-pay | Admitting: *Deleted

## 2022-07-25 DIAGNOSIS — E538 Deficiency of other specified B group vitamins: Secondary | ICD-10-CM

## 2022-07-25 MED ORDER — CYANOCOBALAMIN 1000 MCG/ML IJ SOLN: INTRAMUSCULAR | 3 refills | Status: AC

## 2022-07-25 NOTE — Telephone Encounter (Signed)
[facility] faxed a refill request for D[place]x inj injection monthly-#3.  Message sent to PCP's teamcare.

## 2022-08-17 ENCOUNTER — Encounter: Payer: Self-pay | Admitting: Adult Health

## 2022-08-17 ENCOUNTER — Ambulatory Visit: Payer: Federal, State, Local not specified - PPO | Admitting: Adult Health

## 2022-08-17 DIAGNOSIS — J449 Chronic obstructive pulmonary disease, unspecified: Secondary | ICD-10-CM

## 2022-08-17 DIAGNOSIS — J9611 Chronic respiratory failure with hypoxia: Secondary | ICD-10-CM | POA: Diagnosis not present

## 2022-08-17 NOTE — Progress Notes (Signed)
@Patient  ID: , female    DOB: 1923/06/13, 88 y.o.   MRN: [id number]  Chief Complaint  Patient presents with   Follow-up    Referring provider: [id number], Philip Aspen*  HPI: 88 year old female former smoker quit in 1980 followed for severe COPD, bronchiectasis, chronic respiratory failure on oxygen at 4 L Medical history significant for A-fib  TEST/EVENTS :  12/03/2009-FEV1 67 (40%) with 12% response to bronchodilator and DLCO 55%  2D echo 2017 showed EF at 60-65%, diastolic dysfunction, RV size nml , systolic fxn nml , elevated pulmonary artery pressure at 52 mmHg    08/17/2022 Follow up : COPD and O2 RF  Patient returns for 6-week follow-up.  She has underlying severe COPD and chronic respiratory failure on oxygen.  Feels that she has been going downhill with increased shortness of breath, decreased activity tolerance.  Also feels weaker.  She is on hospice care at home.  Typically uses morphine a few times a week when she gets into panic with her breathing.   She remains on oxygen 4 L at rest and 8 L with activity.  S  Typically uses albuterol nebulizer 3 times daily.  And is on chronic steroids with prednisone 10mg . She has an extensive of family that helps her. Last visit patient was having increased symptom burden along with weakness.  Urinary labs and culture showed a positive infection.  She was treated with a 7-day course of antibiotics.  Patient says these did help some.  She is now on a daily preventative antibiotic with Bactrim DS.  Patient also was changed from Symbicort and Spiriva 2 Brovana budesonide and DuoNeb.  Patient says that the nebulizers have helped but she has stopped taking Brovana and budesonide.  Feels that it was just too much taking that many nebulizers in 1 day.  She is now doing ipratropium and albuterol nebs 4 times daily.  She says this seems to be working well.  Prednisone was also increased from 5 mg to 10 mg.  She does feel that she is doing  some better as long as she is sitting still she says she does not have much symptoms but is just when she gets up to move around that she gets very short of breath and has very weak.  We discussed in detail with her and her daughter regarding upcoming vaccines.  She does want to get the flu shot.  She also will get the COVID booster and RSV vaccine later on in the fall as well.  She remains on oxygen 4 L at rest and 8 L with activity.  She says this usually keeps her oxygen levels up for the most part.  If she is up for a prolonged period time she can drop down pretty quickly and her O2 sats.  She denies any hemoptysis, chest pain, orthopnea.  Appetite is fair.   She says she is taking morphine on average about once a day. Feels that hospice is really helping her.   Allergies  Allergen Reactions   Penicillins Rash    Has patient had a PCN reaction causing immediate rash, facial/tongue/throat swelling, SOB or lightheadedness with hypotension: Yes Has patient had a PCN reaction causing severe rash involving mucus membranes or skin necrosis: Unknown Has patient had a PCN reaction that required hospitalization: Unknown Has patient had a PCN reaction occurring within the last 10 years: No If all of the above answers are "NO", then may proceed with Cephalosporin use.  Immunization History  Administered Date(s) Administered   Fluad Quad(high Dose 65+) 08/12/2019, 10/13/2021   Influenza Split 09/26/2011, 09/27/2012   Influenza Whole 09/30/2009   Influenza, High Dose Seasonal PF 09/24/2013, 01/10/2016, 09/21/2016, 09/25/2017, 09/18/2018   Influenza-Unspecified 10/13/2014, 09/13/2020   PFIZER(Purple Top)SARS-COV-2 Vaccination 12/29/2019, 01/19/2020, 09/13/2020, 04/08/2021   PNEUMOCOCCAL CONJUGATE-20 11/25/2021   Pfizer Covid-19 Vaccine Bivalent Booster 64yrs & up 09/05/2021   Pneumococcal Conjugate-13 02/11/2014   Pneumococcal Polysaccharide-23 05/05/2009   Td 05/05/2009    Past Medical  History:  Diagnosis Date   Anemia    B12 DEFICIENCY 03/31/2008   Bronchiectasis with acute exacerbation (HCC) 12/24/2018   COPD 11/27/2008   COPD exacerbation (HCC) 11/27/2008   Qualifier: Diagnosis of  By: Amador Cunas  MD, Janett Labella    ESOPHAGEAL STRICTURE 01/29/2009   MASS, LUNG 10/13/2009   NASAL FRACTURE 08/12/2009   OSTEOARTHRITIS 03/31/2008   Hips   OSTEOPOROSIS 12/27/2007   PEDAL EDEMA 12/27/2007   Pneumonia 07/10/2016   PULMONARY NODULE 10/21/2009   RESPIRATORY FAILURE, CHRONIC 06/29/2010   Shortness of breath dyspnea     Tobacco History: Social History   Tobacco Use  Smoking Status Former   Packs/day: 0.50   Years: 35.00   Total pack years: 17.50   Types: Cigarettes   Start date: 1946   Quit date: 12/18/1978   Years since quitting: 43.6  Smokeless Tobacco Never   Counseling given: Not Answered   Outpatient Medications Prior to Visit  Medication Sig Dispense Refill   albuterol (PROVENTIL) (2.5 MG/3ML) 0.083% nebulizer solution USE 1 VIAL VIA NEBULIZER EVERY 4 HOURS AS NEEDED FOR WHEEZING OR SHORTNESS OF BREATH 225 mL 5   ALPRAZolam (XANAX) 0.25 MG tablet TAKE 1 TABLET BY MOUTH TWICE DAILY AS NEEDED 60 tablet 2   benzonatate (TESSALON) 100 MG capsule Take by mouth 3 (three) times daily as needed for cough.     Cholecalciferol 50000 units TABS Take 5,000 Units by mouth.     cyanocobalamin (VITAMIN B12) 1000 MCG/ML injection Inject 1 ml every 30 days 6 mL 3   escitalopram (LEXAPRO) 10 MG tablet TAKE 1 TABLET BY MOUTH EVERY DAY. STOP 5MG  30 tablet 2   furosemide (LASIX) 20 MG tablet TAKE 1 TABLET(20 MG) BY MOUTH DAILY AS NEEDED 30 tablet 2   ipratropium (ATROVENT) 0.02 % nebulizer solution Take 2.5 mLs (0.5 mg total) by nebulization 3 (three) times daily. Add to albuterol neb 75 mL 5   mirtazapine (REMERON) 15 MG tablet TAKE 1 TABLET(15 MG) BY MOUTH AT BEDTIME 90 tablet 1   Morphine Sulfate (MORPHINE CONCENTRATE) 10 mg / 0.5 ml concentrated solution Take by mouth as needed. 2.5  ml daily     MUCINEX FAST-MAX DM MAX 20-400 MG/20ML LIQD TAKE 20 ML BY MOUTH FOUR TIMES DAILY AS NEEDED FOR COUGH AND CONGESTION 1775 mL 0   Multiple Vitamins-Minerals (PRESERVISION AREDS PO) Take by mouth daily.     naproxen sodium (ALEVE) 220 MG tablet Take 220 mg by mouth as needed.     OVER THE COUNTER MEDICATION OTC Acid controller-once a day  [facility]     OXYGEN 4 L/min. Oxygen 3lpm 24/7     pantoprazole (PROTONIX) 40 MG tablet TAKE 1 TABLET(40 MG) BY MOUTH DAILY 30 TO 60 MINUTES BEFORE FIRST MEAL OF THE DAY 90 tablet 1   Polyethyl Glycol-Propyl Glycol (SYSTANE OP) Place 1 drop into both eyes daily.     polyethylene glycol powder (GLYCOLAX/MIRALAX) 17 GM/SCOOP powder DISSOLVE 17 GRAMS(1 SCOOP) IN 6-8 OZ OF  FLUID AND DRINK BY MOUTH ONCE DAILY AS NEEDED FOR CONSTIPATION 238 g 2   predniSONE (DELTASONE) 10 MG tablet Take 1 tablet (10 mg total) by mouth daily with breakfast. 14 tablet 0   sodium chloride (OCEAN) 0.65 % SOLN nasal spray Place 1 spray into both nostrils as needed for congestion.     Sulfamethoxazole-Trimethoprim (SULFATRIM PEDIATRIC PO) 20mg  daily     Wheat Dextrin (BENEFIBER) POWD Take 1 Dose by mouth daily.     arformoterol (BROVANA) 15 MCG/2ML NEBU Take 2 mLs (15 mcg total) by nebulization in the morning and at bedtime. Can mix with Budesonide (Patient not taking: Reported on 08/17/2022) 120 mL 5   budesonide (PULMICORT) 0.5 MG/2ML nebulizer solution Take 2 mLs (0.5 mg total) by nebulization 2 (two) times daily. Can mix with brovana (Patient not taking: Reported on 08/17/2022) 120 mL 5   levofloxacin (LEVAQUIN) 500 MG tablet Take 1 tablet (500 mg total) by mouth daily with breakfast. 7 tablet 0   predniSONE (DELTASONE) 5 MG tablet TAKE 1 TABLET BY MOUTH DAILY 90 tablet 1   No facility-administered medications prior to visit.     Review of Systems:   Constitutional:   No  weight loss, night sweats,  Fevers, chills, +fatigue, or  lassitude.  HEENT:   No headaches,   Difficulty swallowing,  Tooth/dental problems, or  Sore throat,                No sneezing, itching, ear ache, nasal congestion, post nasal drip,   CV:  No chest pain,  Orthopnea, PND, swelling in lower extremities, anasarca, dizziness, palpitations, syncope.   GI  No heartburn, indigestion, abdominal pain, nausea, vomiting, diarrhea, change in bowel habits, loss of appetite, bloody stools.   Resp:   No chest wall deformity  Skin: no rash or lesions.  GU: no dysuria, change in color of urine, no urgency or frequency.  No flank pain, no hematuria   MS:  No joint pain or swelling.  No decreased range of motion.  No back pain.    Physical Exam  Pulse 78   Temp 98.3 F (36.8 C) (Oral)   Ht 5\' 4"  (1.626 m)   Wt 113 lb 9.6 oz (51.5 kg)   SpO2 99%   BMI 19.50 kg/m   GEN: A/Ox3; pleasant , NAD, elderly in wheelchair on oxygen   HEENT:  [place]/AT,  , NOSE-clear, THROAT-clear, no lesions, no postnasal drip or exudate noted.   NECK:  Supple w/ fair ROM; no JVD; normal carotid impulses w/o bruits; no thyromegaly or nodules palpated; no lymphadenopathy.    RESP  Clear  P & A; w/o, wheezes/ rales/ or rhonchi. no accessory muscle use, no dullness to percussion  CARD:  RRR, no m/r/g, no peripheral edema, pulses intact, no cyanosis or clubbing.  GI:   Soft & nt; nml bowel sounds; no organomegaly or masses detected.   Musco: Warm bil, no deformities or joint swelling noted.   Neuro: alert, no focal deficits noted.    Skin: Warm, no lesions or rashes    Lab Results:      BNP No results found for: "BNP"    Imaging: No results found.        No data to display          No results found for: "NITRICOXIDE"      Assessment & Plan:   Chronic respiratory failure with hypoxia (HCC) Continue on oxygen to keep O2 saturations greater than 88 to  90%  Plan  Patient Instructions  Albuterol and Ipratropium Neb Four times a day   Continue on Prednisone 10mg  daily.  Flu  shot, Covid booster and RSV vaccine later this Fall , few weeks in between each shot.  Tessalon Three times a day  As needed   Activity as tolerated  Saline nasal spray Twice daily   Saline nasal gel At bedtime  .  Continue on Oxygen 4 l/m rest , 8l/m with walking/activity to keep sats >88-90%.  Follow up with Dr. [id number]  or Rylann Munford In 4 months and As needed   Please contact office for sooner follow up if symptoms do not improve or worsen or seek emergency care         COPD GOLD III  Patient has underlying severe COPD with high symptom burden.  She is on hospice at home.  And also using morphine for increased work of breathing and severe anxiety.  Patient is doing better on current regimen with nebulizers.  For now we will continue on DuoNeb 4 times daily and prednisone at 10 mg daily.  Plan  Patient Instructions  Albuterol and Ipratropium Neb Four times a day   Continue on Prednisone 10mg  daily.  Flu shot, Covid booster and RSV vaccine later this Fall , few weeks in between each shot.  Tessalon Three times a day  As needed   Activity as tolerated  Saline nasal spray Twice daily   Saline nasal gel At bedtime  .  Continue on Oxygen 4 l/m rest , 8l/m with walking/activity to keep sats >88-90%.  Follow up with Dr.  or Melanni Benway In 4 months and As needed   Please contact office for sooner follow up if symptoms do not improve or worsen or seek emergency care           [id number], NP 08/17/2022

## 2022-08-17 NOTE — Assessment & Plan Note (Signed)
Patient has underlying severe COPD with high symptom burden.  She is on hospice at home.  And also using morphine for increased work of breathing and severe anxiety.  Patient is doing better on current regimen with nebulizers.  For now we will continue on DuoNeb 4 times daily and prednisone at 10 mg daily.  Plan  Patient Instructions  Albuterol and Ipratropium Neb Four times a day   Continue on Prednisone 10mg  daily.  Flu shot, Covid booster and RSV vaccine later this Fall , few weeks in between each shot.  Tessalon Three times a day  As needed   Activity as tolerated  Saline nasal spray Twice daily   Saline nasal gel At bedtime  .  Continue on Oxygen 4 l/m rest , 8l/m with walking/activity to keep sats >88-90%.  Follow up with Dr. 01-13-1971  or Breslyn Abdo In 4 months and As needed   Please contact office for sooner follow up if symptoms do not improve or worsen or seek emergency care

## 2022-08-17 NOTE — Assessment & Plan Note (Signed)
Continue on oxygen to keep O2 saturations greater than 88 to 90%  Plan  Patient Instructions  Albuterol and Ipratropium Neb Four times a day   Continue on Prednisone 10mg  daily.  Flu shot, Covid booster and RSV vaccine later this Fall , few weeks in between each shot.  Tessalon Three times a day  As needed   Activity as tolerated  Saline nasal spray Twice daily   Saline nasal gel At bedtime  .  Continue on Oxygen 4 l/m rest , 8l/m with walking/activity to keep sats >88-90%.  Follow up with Dr. 01-13-1971  or Josua Ferrebee In 4 months and As needed   Please contact office for sooner follow up if symptoms do not improve or worsen or seek emergency care

## 2022-08-17 NOTE — Patient Instructions (Addendum)
Albuterol and Ipratropium Neb Four times a day   Continue on Prednisone 10mg  daily.  Flu shot, Covid booster and RSV vaccine later this Fall , few weeks in between each shot.  Tessalon Three times a day  As needed   Activity as tolerated  Saline nasal spray Twice daily   Saline nasal gel At bedtime  .  Continue on Oxygen 4 l/m rest , 8l/m with walking/activity to keep sats >88-90%.  Follow up with Dr. 01-13-1971  or Jadriel Saxer In 4 months and As needed   Please contact office for sooner follow up if symptoms do not improve or worsen or seek emergency care

## 2022-08-30 ENCOUNTER — Telehealth: Payer: Self-pay | Admitting: Adult Health

## 2022-08-30 NOTE — Telephone Encounter (Signed)
Patient's daughter in law/ care giver, Nicholos Johns, called and states arformoterol was just filled at the pharmacy, but nothing about that medication was on the AVS from last visit, 08/17/2022. Nicholos Johns would like a call to discuss treatment plan.  Please call back at [phone].

## 2022-09-04 NOTE — Telephone Encounter (Signed)
Called patient and talked to daughter. She was confused as to why medication was called in. Brovana had refills on it still from 2 months ago. Patient is not taking medication at this moment. But I advised daughter to not throw anything away. Nothing further needed

## 2022-09-20 ENCOUNTER — Ambulatory Visit: Payer: Federal, State, Local not specified - PPO | Admitting: Internal Medicine

## 2022-09-20 ENCOUNTER — Encounter: Payer: Self-pay | Admitting: Internal Medicine

## 2022-09-20 VITALS — HR 67 | Temp 98.3°F | Wt 111.4 lb

## 2022-09-20 DIAGNOSIS — J441 Chronic obstructive pulmonary disease with (acute) exacerbation: Secondary | ICD-10-CM

## 2022-09-20 DIAGNOSIS — Z23 Encounter for immunization: Secondary | ICD-10-CM | POA: Diagnosis not present

## 2022-09-20 MED ORDER — PREDNISONE 20 MG PO TABS: 20.0000 mg | ORAL_TABLET | Freq: Every day | ORAL | 0 refills | Status: AC

## 2022-09-20 MED ORDER — DOXYCYCLINE HYCLATE 100 MG PO TABS: 100.0000 mg | ORAL_TABLET | Freq: Two times a day (BID) | ORAL | 0 refills | Status: AC

## 2022-09-20 NOTE — Progress Notes (Signed)
Established Patient Office Visit     CC/Reason for Visit: Cough  HPI: Sharon [name] is a 88 y.o. female who is coming in today for the above mentioned reasons. Past Medical History is significant for: End-stage COPD on hospice.  She is on chronic steroids and takes morphine.  Her baseline oxygen requirements go between 4 and 8 L.  For the past couple days she has been having decreased appetite, more tired, increased cough and sputum production that is light yellow in color.  She has felt a little more short of breath than her baseline.   Past Medical/Surgical History: Past Medical History:  Diagnosis Date   Anemia    B12 DEFICIENCY 03/31/2008   Bronchiectasis with acute exacerbation ([place]) 12/24/2018   COPD 11/27/2008   COPD exacerbation ([place]) 11/27/2008   Qualifier: Diagnosis of  By: Burnice Logan  MD, Doretha Sou    ESOPHAGEAL STRICTURE 01/29/2009   MASS, LUNG 10/13/2009   NASAL FRACTURE 08/12/2009   OSTEOARTHRITIS 03/31/2008   Hips   OSTEOPOROSIS 12/27/2007   PEDAL EDEMA 12/27/2007   Pneumonia 07/10/2016   PULMONARY NODULE 10/21/2009   RESPIRATORY FAILURE, CHRONIC 06/29/2010   Shortness of breath dyspnea     Past Surgical History:  Procedure Laterality Date   APPENDECTOMY     '38   BALLOON DILATION N/A 05/10/2015   Procedure: BALLOON DILATION;  Surgeon: Inda Castle, MD;  Location: [facility];  Service: Endoscopy;  Laterality: N/A;   BALLOON DILATION N/A 08/02/2015   Procedure: BALLOON DILATION;  Surgeon: Inda Castle, MD;  Location: [facility];  Service: Endoscopy;  Laterality: N/A;   BREAST SURGERY     biopsy '47   CATARACT EXTRACTION Bilateral    last '05   CHOLECYSTECTOMY     laparoscopic'10   ESOPHAGOGASTRODUODENOSCOPY     with dilatation x3   ESOPHAGOGASTRODUODENOSCOPY (EGD) WITH PROPOFOL N/A 05/10/2015   Procedure: ESOPHAGOGASTRODUODENOSCOPY (EGD) WITH PROPOFOL;  Surgeon: Inda Castle, MD;  Location: [facility];  Service: Endoscopy;  Laterality: N/A;   balloon dilation   ESOPHAGOGASTRODUODENOSCOPY (EGD) WITH PROPOFOL N/A 08/02/2015   Procedure: ESOPHAGOGASTRODUODENOSCOPY (EGD) WITH PROPOFOL;  Surgeon: Inda Castle, MD;  Location: [facility];  Service: Endoscopy;  Laterality: N/A;   LUMBAR LAMINECTOMY     NASAL RECONSTRUCTION WITH SEPTAL REPAIR     '10   RECTAL PROLAPSE REPAIR     '06   skin graf     '98    Social History:  reports that she quit smoking about 43 years ago. Her smoking use included cigarettes. She started smoking about 77 years ago. She has a 17.50 pack-year smoking history. She has never used smokeless tobacco. She reports current alcohol use. She reports that she does not use drugs.  Allergies: Allergies  Allergen Reactions   Penicillins Rash    Has patient had a PCN reaction causing immediate rash, facial/tongue/throat swelling, SOB or lightheadedness with hypotension: Yes Has patient had a PCN reaction causing severe rash involving mucus membranes or skin necrosis: Unknown Has patient had a PCN reaction that required hospitalization: Unknown Has patient had a PCN reaction occurring within the last 10 years: No If all of the above answers are "NO", then may proceed with Cephalosporin use.     Family History:  Family History  Problem Relation Age of Onset   Hypertension Son    Asthma Mother    Abdominal Wall Hernia Mother    Stomach cancer Mother      Current Outpatient  Medications:    ALPRAZolam (XANAX) 0.25 MG tablet, TAKE 1 TABLET BY MOUTH TWICE DAILY AS NEEDED, Disp: 60 tablet, Rfl: 2   benzonatate (TESSALON) 100 MG capsule, Take by mouth 3 (three) times daily as needed for cough., Disp: , Rfl:    Cholecalciferol 50000 units TABS, Take 5,000 Units by mouth., Disp: , Rfl:    cyanocobalamin (VITAMIN B12) 1000 MCG/ML injection, Inject 1 ml every 30 days, Disp: 6 mL, Rfl: 3   doxycycline (VIBRA-TABS) 100 MG tablet, Take 1 tablet (100 mg total) by mouth 2 (two) times daily for 7 days., Disp: 14 tablet,  Rfl: 0   escitalopram (LEXAPRO) 10 MG tablet, TAKE 1 TABLET BY MOUTH EVERY DAY. STOP 5MG , Disp: 30 tablet, Rfl: 2   famotidine (PEPCID) 20 MG tablet, Take 20 mg by mouth 2 (two) times daily., Disp: , Rfl:    furosemide (LASIX) 20 MG tablet, TAKE 1 TABLET(20 MG) BY MOUTH DAILY AS NEEDED, Disp: 30 tablet, Rfl: 2   ibuprofen (ADVIL) 600 MG tablet, , Disp: , Rfl:    ipratropium (ATROVENT) 0.02 % nebulizer solution, Take 2.5 mLs (0.5 mg total) by nebulization 3 (three) times daily. Add to albuterol neb, Disp: 75 mL, Rfl: 5   mirtazapine (REMERON) 15 MG tablet, TAKE 1 TABLET(15 MG) BY MOUTH AT BEDTIME, Disp: 90 tablet, Rfl: 1   Morphine Sulfate (MORPHINE CONCENTRATE) 10 mg / 0.5 ml concentrated solution, Take by mouth as needed. 2.5 ml daily, Disp: , Rfl:    MUCINEX FAST-MAX DM MAX 20-400 MG/20ML LIQD, TAKE 20 ML BY MOUTH FOUR TIMES DAILY AS NEEDED FOR COUGH AND CONGESTION, Disp: 1775 mL, Rfl: 0   Multiple Vitamins-Minerals (PRESERVISION AREDS PO), Take by mouth daily., Disp: , Rfl:    naproxen sodium (ALEVE) 220 MG tablet, Take 220 mg by mouth as needed., Disp: , Rfl:    OVER THE COUNTER MEDICATION, OTC Acid controller-once a day  [facility], Disp: , Rfl:    OXYGEN, 4 L/min. Oxygen 3lpm 24/7, Disp: , Rfl:    pantoprazole (PROTONIX) 40 MG tablet, TAKE 1 TABLET(40 MG) BY MOUTH DAILY 30 TO 60 MINUTES BEFORE FIRST MEAL OF THE DAY, Disp: 90 tablet, Rfl: 1   Polyethyl Glycol-Propyl Glycol (SYSTANE OP), Place 1 drop into both eyes daily., Disp: , Rfl:    polyethylene glycol powder (GLYCOLAX/MIRALAX) 17 GM/SCOOP powder, DISSOLVE 17 GRAMS(1 SCOOP) IN 6-8 OZ OF FLUID AND DRINK BY MOUTH ONCE DAILY AS NEEDED FOR CONSTIPATION, Disp: 238 g, Rfl: 2   predniSONE (DELTASONE) 10 MG tablet, Take 1 tablet (10 mg total) by mouth daily with breakfast., Disp: 14 tablet, Rfl: 0   predniSONE (DELTASONE) 20 MG tablet, Take 1 tablet (20 mg total) by mouth daily with breakfast for 10 days., Disp: 10 tablet, Rfl: 0   sodium chloride  (OCEAN) 0.65 % SOLN nasal spray, Place 1 spray into both nostrils as needed for congestion., Disp: , Rfl:    Sulfamethoxazole-Trimethoprim (SULFATRIM PEDIATRIC PO), 20mg  daily, Disp: , Rfl:    Wheat Dextrin (BENEFIBER) POWD, Take 1 Dose by mouth daily., Disp: , Rfl:    albuterol (PROVENTIL) (2.5 MG/3ML) 0.083% nebulizer solution, USE 1 VIAL VIA NEBULIZER EVERY 4 HOURS AS NEEDED FOR WHEEZING OR SHORTNESS OF BREATH (Patient not taking: Reported on 09/20/2022), Disp: 225 mL, Rfl: 5   arformoterol (BROVANA) 15 MCG/2ML NEBU, Take 2 mLs (15 mcg total) by nebulization in the morning and at bedtime. Can mix with Budesonide (Patient not taking: Reported on 08/17/2022), Disp: 120 mL, Rfl: 5  budesonide (PULMICORT) 0.5 MG/2ML nebulizer solution, Take 2 mLs (0.5 mg total) by nebulization 2 (two) times daily. Can mix with brovana (Patient not taking: Reported on 08/17/2022), Disp: 120 mL, Rfl: 5  Review of Systems:  Constitutional: Denies fever, chills, diaphoresis. HEENT: Denies photophobia, eye pain, redness, mouth sores, trouble swallowing, neck pain, neck stiffness and tinnitus.   Respiratory: Denies chest tightness. Cardiovascular: Denies chest pain, palpitations and leg swelling.  Gastrointestinal: Denies nausea, vomiting, abdominal pain, diarrhea, constipation, blood in stool and abdominal distention.  Genitourinary: Denies dysuria, urgency, frequency, hematuria, flank pain and difficulty urinating.  Endocrine: Denies: hot or cold intolerance, sweats, changes in hair or nails, polyuria, polydipsia. Musculoskeletal: Denies myalgias, back pain, joint swelling, arthralgias and gait problem.  Skin: Denies pallor, rash and wound.  Hematological: Denies adenopathy. Easy bruising, personal or family bleeding history  Psychiatric/Behavioral: Denies suicidal ideation, mood changes, confusion, nervousness, sleep disturbance and agitation    Physical Exam: Vitals:   09/20/22 1259  Pulse: 67  Temp: 98.3 F  (36.8 C)  TempSrc: Oral  SpO2: 97%  Weight: 111 lb 6.4 oz (50.5 kg)    Body mass index is 19.12 kg/m.   Constitutional: NAD, calm, comfortable, in a wheelchair Eyes: PERRL, lids and conjunctivae normal ENMT: Mucous membranes are moist.  Respiratory: Poor air movement with significant right upper lobe crackles.  Normal respiratory effort. No accessory muscle use.  Cardiovascular: Regular rate and rhythm, no murmurs / rubs / gallops.  Psychiatric: Normal judgment and insight. Alert and oriented x 3. Normal mood.    Impression and Plan:  COPD with acute exacerbation ([place]) - Plan: predniSONE (DELTASONE) 20 MG tablet, doxycycline (VIBRA-TABS) 100 MG tablet  Need for influenza vaccination  -Treat with increased dose of steroids and a round of antibiotics; hopefully this will avoid hospital admission. -Continue to use morphine as needed, advised that she may take albuterol every 2 hours as needed. -She will notify us if no significant improvement.  -Flu vaccine administered today.  Time spent:31 minutes reviewing chart, interviewing and examining patient and formulating plan of care.       Lelon Frohlich, MD [facility]

## 2022-09-21 ENCOUNTER — Telehealth: Payer: Self-pay | Admitting: Internal Medicine

## 2022-09-22 NOTE — Telephone Encounter (Signed)
Spoke with [facility] (per DPR) and confirmed we could see notes from pt's PCP appointment as well as dx and medications. [name] stated understanding. Nothing further needed at this time.

## 2022-09-25 ENCOUNTER — Telehealth: Payer: Self-pay

## 2022-09-25 NOTE — Telephone Encounter (Signed)
Spoke with Almyra Free from [employer]. She updates patient is not improving much, still has poor appetite, staying in the bed for the last 3 days, which is not usually normal self. She mention patient is shaky, body warm but no fever. Her breathing is 40 breath/ min. And lungs sound rhonchi. Patient has two days of doxycycline left.   Please advise.

## 2022-09-26 NOTE — Telephone Encounter (Signed)
Contacted Almyra Free at [phone]. Inform her of message below. Verbalized understanding. She said she will talk to her team.

## 2022-12-21 ENCOUNTER — Ambulatory Visit: Payer: Federal, State, Local not specified - PPO | Admitting: Adult Health

## 2023-01-08 ENCOUNTER — Telehealth: Payer: Self-pay | Admitting: Internal Medicine

## 2023-01-08 NOTE — Telephone Encounter (Signed)
[facility] call and stated pt pass away on 01/26/2023/mm

## 2023-01-18 DEATH — deceased
